# Patient Record
Sex: Female | Born: 1950
Health system: Southern US, Community
[De-identification: ages and names within clinical notes are randomized; demographics above are authoritative.]

## PROBLEM LIST (undated history)

## (undated) DIAGNOSIS — C50919 Malignant neoplasm of unspecified site of unspecified female breast: Secondary | ICD-10-CM

## (undated) DIAGNOSIS — R200 Anesthesia of skin: Secondary | ICD-10-CM

## (undated) DIAGNOSIS — M059 Rheumatoid arthritis with rheumatoid factor, unspecified: Secondary | ICD-10-CM

## (undated) DIAGNOSIS — M1711 Unilateral primary osteoarthritis, right knee: Secondary | ICD-10-CM

## (undated) DIAGNOSIS — R011 Cardiac murmur, unspecified: Secondary | ICD-10-CM

## (undated) DIAGNOSIS — Z9889 Other specified postprocedural states: Secondary | ICD-10-CM

## (undated) DIAGNOSIS — I499 Cardiac arrhythmia, unspecified: Secondary | ICD-10-CM

## (undated) DIAGNOSIS — E119 Type 2 diabetes mellitus without complications: Secondary | ICD-10-CM

## (undated) DIAGNOSIS — C801 Malignant (primary) neoplasm, unspecified: Secondary | ICD-10-CM

## (undated) DIAGNOSIS — R202 Paresthesia of skin: Secondary | ICD-10-CM

## (undated) DIAGNOSIS — R51 Headache: Secondary | ICD-10-CM

## (undated) DIAGNOSIS — I8290 Acute embolism and thrombosis of unspecified vein: Secondary | ICD-10-CM

## (undated) DIAGNOSIS — I739 Peripheral vascular disease, unspecified: Secondary | ICD-10-CM

## (undated) DIAGNOSIS — I209 Angina pectoris, unspecified: Secondary | ICD-10-CM

## (undated) DIAGNOSIS — I7300 Raynaud's syndrome without gangrene: Secondary | ICD-10-CM

## (undated) DIAGNOSIS — I10 Essential (primary) hypertension: Secondary | ICD-10-CM

## (undated) DIAGNOSIS — M1990 Unspecified osteoarthritis, unspecified site: Secondary | ICD-10-CM

## (undated) DIAGNOSIS — R197 Diarrhea, unspecified: Secondary | ICD-10-CM

## (undated) DIAGNOSIS — I639 Cerebral infarction, unspecified: Secondary | ICD-10-CM

## (undated) DIAGNOSIS — R112 Nausea with vomiting, unspecified: Secondary | ICD-10-CM

## (undated) DIAGNOSIS — Z87442 Personal history of urinary calculi: Secondary | ICD-10-CM

## (undated) DIAGNOSIS — R519 Headache, unspecified: Secondary | ICD-10-CM

## (undated) DIAGNOSIS — C719 Malignant neoplasm of brain, unspecified: Secondary | ICD-10-CM

## (undated) DIAGNOSIS — K219 Gastro-esophageal reflux disease without esophagitis: Secondary | ICD-10-CM

## (undated) DIAGNOSIS — D649 Anemia, unspecified: Secondary | ICD-10-CM

## (undated) DIAGNOSIS — C3490 Malignant neoplasm of unspecified part of unspecified bronchus or lung: Secondary | ICD-10-CM

## (undated) DIAGNOSIS — K635 Polyp of colon: Secondary | ICD-10-CM

## (undated) DIAGNOSIS — E7800 Pure hypercholesterolemia, unspecified: Secondary | ICD-10-CM

## (undated) DIAGNOSIS — J189 Pneumonia, unspecified organism: Secondary | ICD-10-CM

## (undated) HISTORY — PX: EYE SURGERY: SHX253

## (undated) HISTORY — PX: CHOLECYSTECTOMY: SHX55

## (undated) HISTORY — DX: Rheumatoid arthritis with rheumatoid factor, unspecified: M05.9

## (undated) HISTORY — DX: Unilateral primary osteoarthritis, right knee: M17.11

## (undated) HISTORY — PX: BILATERAL OOPHORECTOMY: SHX1221

## (undated) HISTORY — PX: OTHER SURGICAL HISTORY: SHX169

## (undated) SURGERY — COLONOSCOPY
Anesthesia: Moderate Sedation

---
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
## 1975-02-15 HISTORY — PX: ABDOMINAL HYSTERECTOMY: SHX81

## 1997-02-14 HISTORY — PX: OTHER SURGICAL HISTORY: SHX169

## 2000-02-29 ENCOUNTER — Ambulatory Visit (HOSPITAL_BASED_OUTPATIENT_CLINIC_OR_DEPARTMENT_OTHER): Admission: RE | Admit: 2000-02-29 | Discharge: 2000-02-29 | Payer: Self-pay | Admitting: General Surgery

## 2000-10-20 ENCOUNTER — Ambulatory Visit (HOSPITAL_COMMUNITY): Admission: RE | Admit: 2000-10-20 | Discharge: 2000-10-20 | Payer: Self-pay | Admitting: Pulmonary Disease

## 2000-11-02 ENCOUNTER — Ambulatory Visit (HOSPITAL_COMMUNITY): Admission: RE | Admit: 2000-11-02 | Discharge: 2000-11-02 | Payer: Self-pay | Admitting: Pulmonary Disease

## 2001-03-29 ENCOUNTER — Ambulatory Visit (HOSPITAL_COMMUNITY): Admission: RE | Admit: 2001-03-29 | Discharge: 2001-03-29 | Payer: Self-pay | Admitting: Pulmonary Disease

## 2001-04-09 ENCOUNTER — Ambulatory Visit (HOSPITAL_COMMUNITY): Admission: RE | Admit: 2001-04-09 | Discharge: 2001-04-09 | Payer: Self-pay | Admitting: Pulmonary Disease

## 2002-02-14 HISTORY — PX: OTHER SURGICAL HISTORY: SHX169

## 2002-08-26 ENCOUNTER — Other Ambulatory Visit: Admission: RE | Admit: 2002-08-26 | Discharge: 2002-08-26 | Payer: Self-pay | Admitting: Dermatology

## 2002-12-07 ENCOUNTER — Encounter: Payer: Self-pay | Admitting: Internal Medicine

## 2002-12-07 ENCOUNTER — Encounter: Admission: RE | Admit: 2002-12-07 | Discharge: 2002-12-07 | Payer: Self-pay | Admitting: Internal Medicine

## 2002-12-17 ENCOUNTER — Encounter: Admission: RE | Admit: 2002-12-17 | Discharge: 2002-12-17 | Payer: Self-pay | Admitting: Internal Medicine

## 2004-04-05 ENCOUNTER — Ambulatory Visit: Payer: Self-pay | Admitting: Internal Medicine

## 2004-04-26 ENCOUNTER — Ambulatory Visit: Payer: Self-pay | Admitting: Internal Medicine

## 2004-04-26 ENCOUNTER — Ambulatory Visit (HOSPITAL_COMMUNITY): Admission: RE | Admit: 2004-04-26 | Discharge: 2004-04-26 | Payer: Self-pay | Admitting: Internal Medicine

## 2004-05-06 ENCOUNTER — Encounter: Admission: RE | Admit: 2004-05-06 | Discharge: 2004-05-06 | Payer: Self-pay | Admitting: Orthopedic Surgery

## 2004-05-11 ENCOUNTER — Ambulatory Visit: Payer: Self-pay | Admitting: Internal Medicine

## 2004-06-02 ENCOUNTER — Encounter (HOSPITAL_COMMUNITY): Admission: RE | Admit: 2004-06-02 | Discharge: 2004-07-02 | Payer: Self-pay | Admitting: Orthopedic Surgery

## 2004-07-07 ENCOUNTER — Encounter (HOSPITAL_COMMUNITY): Admission: RE | Admit: 2004-07-07 | Discharge: 2004-08-06 | Payer: Self-pay | Admitting: Orthopedic Surgery

## 2004-08-12 ENCOUNTER — Ambulatory Visit (HOSPITAL_COMMUNITY): Admission: RE | Admit: 2004-08-12 | Discharge: 2004-08-12 | Payer: Self-pay | Admitting: Pulmonary Disease

## 2005-01-04 ENCOUNTER — Ambulatory Visit: Payer: Self-pay | Admitting: Internal Medicine

## 2005-09-09 ENCOUNTER — Encounter: Admission: RE | Admit: 2005-09-09 | Discharge: 2005-09-09 | Payer: Self-pay | Admitting: Internal Medicine

## 2006-01-19 ENCOUNTER — Ambulatory Visit: Payer: Self-pay | Admitting: Internal Medicine

## 2006-04-19 ENCOUNTER — Ambulatory Visit (HOSPITAL_COMMUNITY): Admission: RE | Admit: 2006-04-19 | Discharge: 2006-04-19 | Payer: Self-pay | Admitting: Hematology and Oncology

## 2006-06-15 ENCOUNTER — Ambulatory Visit: Payer: Self-pay | Admitting: Internal Medicine

## 2008-07-28 ENCOUNTER — Encounter: Payer: Self-pay | Admitting: Family Medicine

## 2008-09-22 ENCOUNTER — Encounter: Payer: Self-pay | Admitting: Family Medicine

## 2009-12-04 ENCOUNTER — Ambulatory Visit (HOSPITAL_COMMUNITY): Admission: RE | Admit: 2009-12-04 | Discharge: 2009-12-04 | Payer: Self-pay | Admitting: Internal Medicine

## 2009-12-21 ENCOUNTER — Ambulatory Visit: Payer: Self-pay | Admitting: Internal Medicine

## 2010-02-22 ENCOUNTER — Encounter (INDEPENDENT_AMBULATORY_CARE_PROVIDER_SITE_OTHER): Payer: Self-pay | Admitting: Emergency Medicine

## 2010-02-22 ENCOUNTER — Emergency Department (HOSPITAL_COMMUNITY)
Admission: EM | Admit: 2010-02-22 | Discharge: 2010-02-22 | Disposition: A | Discharge status: Other Acute Inpatient Hospital | Payer: Self-pay | Source: Home / Self Care | Admitting: Emergency Medicine

## 2010-02-22 ENCOUNTER — Inpatient Hospital Stay (HOSPITAL_COMMUNITY)
Admission: AD | Admit: 2010-02-22 | Discharge: 2010-02-28 | Payer: Self-pay | Attending: Internal Medicine | Admitting: Internal Medicine

## 2010-02-23 ENCOUNTER — Encounter (INDEPENDENT_AMBULATORY_CARE_PROVIDER_SITE_OTHER): Payer: Self-pay | Admitting: Family Medicine

## 2010-03-01 LAB — URINALYSIS, ROUTINE W REFLEX MICROSCOPIC
Bilirubin Urine: NEGATIVE
Hgb urine dipstick: NEGATIVE
Ketones, ur: NEGATIVE mg/dL
Nitrite: NEGATIVE
Protein, ur: NEGATIVE mg/dL
Specific Gravity, Urine: <1.005 — ABNORMAL LOW (ref 1.005–1.030)
Urine Glucose, Fasting: NEGATIVE mg/dL
Urobilinogen, UA: 0.2 mg/dL (ref 0.0–1.0)
pH: 5.5 (ref 5.0–8.0)

## 2010-03-01 LAB — COMPREHENSIVE METABOLIC PANEL
ALT: 22 U/L (ref 0–35)
AST: 21 U/L (ref 0–37)
Albumin: 3.8 g/dL (ref 3.5–5.2)
Alkaline Phosphatase: 98 U/L (ref 39–117)
BUN: 17 mg/dL (ref 6–23)
CO2: 25 mEq/L (ref 19–32)
Calcium: 9.6 mg/dL (ref 8.4–10.5)
Chloride: 107 mEq/L (ref 96–112)
Creatinine, Ser: 1.01 mg/dL (ref 0.4–1.2)
GFR calc Af Amer: >60 mL/min (ref >60)
GFR calc non Af Amer: 56 mL/min — ABNORMAL LOW (ref >60)
Glucose, Bld: 111 mg/dL — ABNORMAL HIGH (ref 70–99)
Potassium: 4.1 mEq/L (ref 3.5–5.1)
Sodium: 143 mEq/L (ref 135–145)
Total Bilirubin: 0.4 mg/dL (ref 0.3–1.2)
Total Protein: 7 g/dL (ref 6.0–8.3)

## 2010-03-01 LAB — URINE MICROSCOPIC-ADD ON

## 2010-03-01 LAB — BASIC METABOLIC PANEL
BUN: 10 mg/dL (ref 6–23)
BUN: 17 mg/dL (ref 6–23)
CO2: 26 mEq/L (ref 19–32)
CO2: 24 mEq/L (ref 19–32)
Calcium: 9 mg/dL (ref 8.4–10.5)
Calcium: 8.8 mg/dL (ref 8.4–10.5)
Chloride: 110 mEq/L (ref 96–112)
Chloride: 112 mEq/L (ref 96–112)
Creatinine, Ser: 0.81 mg/dL (ref 0.4–1.2)
Creatinine, Ser: 0.93 mg/dL (ref 0.4–1.2)
GFR calc Af Amer: >60 mL/min (ref >60)
GFR calc Af Amer: >60 mL/min (ref >60)
GFR calc non Af Amer: >60 mL/min (ref >60)
GFR calc non Af Amer: >60 mL/min (ref >60)
Glucose, Bld: 92 mg/dL (ref 70–99)
Glucose, Bld: 97 mg/dL (ref 70–99)
Potassium: 3.7 mEq/L (ref 3.5–5.1)
Potassium: 3.9 mEq/L (ref 3.5–5.1)
Sodium: 144 mEq/L (ref 135–145)
Sodium: 143 mEq/L (ref 135–145)

## 2010-03-01 LAB — CBC
HCT: 33.6 % — ABNORMAL LOW (ref 36.0–46.0)
HCT: 38.5 % (ref 36.0–46.0)
HCT: 35.4 % — ABNORMAL LOW (ref 36.0–46.0)
HCT: 36.9 % (ref 36.0–46.0)
HCT: 36.6 % (ref 36.0–46.0)
HCT: 34 % — ABNORMAL LOW (ref 36.0–46.0)
HCT: 33.6 % — ABNORMAL LOW (ref 36.0–46.0)
Hemoglobin: 10.8 g/dL — ABNORMAL LOW (ref 12.0–15.0)
Hemoglobin: 11.6 g/dL — ABNORMAL LOW (ref 12.0–15.0)
Hemoglobin: 10.9 g/dL — ABNORMAL LOW (ref 12.0–15.0)
Hemoglobin: 11.4 g/dL — ABNORMAL LOW (ref 12.0–15.0)
Hemoglobin: 11.3 g/dL — ABNORMAL LOW (ref 12.0–15.0)
Hemoglobin: 10.4 g/dL — ABNORMAL LOW (ref 12.0–15.0)
Hemoglobin: 10.2 g/dL — ABNORMAL LOW (ref 12.0–15.0)
MCH: 25.9 pg — ABNORMAL LOW (ref 26.0–34.0)
MCH: 25.3 pg — ABNORMAL LOW (ref 26.0–34.0)
MCH: 25.6 pg — ABNORMAL LOW (ref 26.0–34.0)
MCH: 25.4 pg — ABNORMAL LOW (ref 26.0–34.0)
MCH: 25.4 pg — ABNORMAL LOW (ref 26.0–34.0)
MCH: 25.4 pg — ABNORMAL LOW (ref 26.0–34.0)
MCH: 25.3 pg — ABNORMAL LOW (ref 26.0–34.0)
MCHC: 32.1 g/dL (ref 30.0–36.0)
MCHC: 30.1 g/dL (ref 30.0–36.0)
MCHC: 30.8 g/dL (ref 30.0–36.0)
MCHC: 30.9 g/dL (ref 30.0–36.0)
MCHC: 30.9 g/dL (ref 30.0–36.0)
MCHC: 30.6 g/dL (ref 30.0–36.0)
MCHC: 30.4 g/dL (ref 30.0–36.0)
MCV: 80.6 fL (ref 78.0–100.0)
MCV: 84.1 fL (ref 78.0–100.0)
MCV: 83.3 fL (ref 78.0–100.0)
MCV: 82.4 fL (ref 78.0–100.0)
MCV: 82.2 fL (ref 78.0–100.0)
MCV: 83.1 fL (ref 78.0–100.0)
MCV: 83.4 fL (ref 78.0–100.0)
Platelets: 274 10*3/uL (ref 150–400)
Platelets: 323 10*3/uL (ref 150–400)
Platelets: 281 10*3/uL (ref 150–400)
Platelets: 311 10*3/uL (ref 150–400)
Platelets: 316 10*3/uL (ref 150–400)
Platelets: 302 10*3/uL (ref 150–400)
Platelets: 266 10*3/uL (ref 150–400)
RBC: 4.17 MIL/uL (ref 3.87–5.11)
RBC: 4.58 MIL/uL (ref 3.87–5.11)
RBC: 4.25 MIL/uL (ref 3.87–5.11)
RBC: 4.48 MIL/uL (ref 3.87–5.11)
RBC: 4.45 MIL/uL (ref 3.87–5.11)
RBC: 4.09 MIL/uL (ref 3.87–5.11)
RBC: 4.03 MIL/uL (ref 3.87–5.11)
RDW: 15.6 % — ABNORMAL HIGH (ref 11.5–15.5)
RDW: 15.8 % — ABNORMAL HIGH (ref 11.5–15.5)
RDW: 15.7 % — ABNORMAL HIGH (ref 11.5–15.5)
RDW: 15.4 % (ref 11.5–15.5)
RDW: 15.7 % — ABNORMAL HIGH (ref 11.5–15.5)
RDW: 16.3 % — ABNORMAL HIGH (ref 11.5–15.5)
RDW: 16.3 % — ABNORMAL HIGH (ref 11.5–15.5)
WBC: 11.7 10*3/uL — ABNORMAL HIGH (ref 4.0–10.5)
WBC: 11.1 10*3/uL — ABNORMAL HIGH (ref 4.0–10.5)
WBC: 11.9 10*3/uL — ABNORMAL HIGH (ref 4.0–10.5)
WBC: 12.5 10*3/uL — ABNORMAL HIGH (ref 4.0–10.5)
WBC: 16.2 10*3/uL — ABNORMAL HIGH (ref 4.0–10.5)
WBC: 14.9 10*3/uL — ABNORMAL HIGH (ref 4.0–10.5)
WBC: 10.6 10*3/uL — ABNORMAL HIGH (ref 4.0–10.5)

## 2010-03-01 LAB — HEPARIN LEVEL (UNFRACTIONATED)
Heparin Unfractionated: 0.33 IU/mL (ref 0.30–0.70)
Heparin Unfractionated: 0.3 IU/mL (ref 0.30–0.70)
Heparin Unfractionated: >2 IU/mL — ABNORMAL HIGH (ref 0.30–0.70)
Heparin Unfractionated: <0.1 IU/mL — ABNORMAL LOW (ref 0.30–0.70)

## 2010-03-01 LAB — CARDIAC PANEL(CRET KIN+CKTOT+MB+TROPI)
CK, MB: 1.1 ng/mL (ref 0.3–4.0)
CK, MB: 1.3 ng/mL (ref 0.3–4.0)
CK, MB: 1.4 ng/mL (ref 0.3–4.0)
Relative Index: INVALID (ref 0.0–2.5)
Relative Index: INVALID (ref 0.0–2.5)
Relative Index: INVALID (ref 0.0–2.5)
Total CK: 44 U/L (ref 7–177)
Total CK: 40 U/L (ref 7–177)
Total CK: 42 U/L (ref 7–177)
Troponin I: 0.02 ng/mL (ref 0.00–0.06)
Troponin I: <0.01 ng/mL (ref 0.00–0.06)
Troponin I: 0.01 ng/mL (ref 0.00–0.06)

## 2010-03-01 LAB — HEMOGLOBIN A1C
Hgb A1c MFr Bld: 6.1 % — ABNORMAL HIGH (ref <5.7)
Hgb A1c MFr Bld: 5.9 % — ABNORMAL HIGH (ref <5.7)
Mean Plasma Glucose: 128 mg/dL — ABNORMAL HIGH (ref <117)
Mean Plasma Glucose: 123 mg/dL — ABNORMAL HIGH (ref <117)

## 2010-03-01 LAB — LIPID PANEL
Cholesterol: 240 mg/dL — ABNORMAL HIGH (ref 0–200)
HDL: 58 mg/dL (ref >39)
LDL Cholesterol: 132 mg/dL — ABNORMAL HIGH (ref 0–99)
Total CHOL/HDL Ratio: 4.1 RATIO
Triglycerides: 248 mg/dL — ABNORMAL HIGH (ref <150)
VLDL: 50 mg/dL — ABNORMAL HIGH (ref 0–40)

## 2010-03-01 LAB — POCT CARDIAC MARKERS
CKMB, poc: <1 ng/mL — ABNORMAL LOW (ref 1.0–8.0)
Myoglobin, poc: 72.2 ng/mL (ref 12–200)
Troponin i, poc: <0.05 ng/mL (ref 0.00–0.09)

## 2010-03-01 LAB — PROTIME-INR
INR: 0.96 (ref 0.00–1.49)
Prothrombin Time: 13 seconds (ref 11.6–15.2)

## 2010-03-01 LAB — APTT: aPTT: 26 seconds (ref 24–37)

## 2010-03-07 ENCOUNTER — Encounter: Payer: Self-pay | Admitting: Hematology and Oncology

## 2010-03-07 ENCOUNTER — Encounter: Payer: Self-pay | Admitting: Internal Medicine

## 2010-03-10 NOTE — Consult Note (Addendum)
[name], [name]               ACCOUNT NO.:  [id number]  MEDICAL RECORD NO.:  [id number]           PATIENT TYPE:  LOCATION:                                 FACILITY:  PHYSICIAN:  Joycelyn Schmid, MD   DATE OF BIRTH:  [birth year]  DATE OF CONSULTATION:  02/23/2010 DATE OF DISCHARGE:                                CONSULTATION   REASON OR CONSULTATION:  Left internal carotid artery stenosis.  HISTORY OF PRESENT ILLNESS:  This is a 60 year old Caucasian family with past medical history of peripheral neuropathy, irritable bowel syndrome, GERD, rheumatoid arthritis, left breast DCIS status post lumpectomy, status post cholecystectomy, hysterectomy, cervical cancer in situ, and history of bilateral carotid stenosis left than 50%.  The patient was brought to the hospital on this event secondary to transient periods of word-finding difficulty and right facial numbness.  She states that on Saturday at approximately 5 p.m. she was on the phone with a friend, and for approximately 30 seconds she noted she was unable to find the words that she wanted to say.  She comes very off but did not seek attention at this time.  The patient went to sleep that night and states that she stated in bed throughout Sunday.  On Monday morning, she got up as usual and went to work.  While she was at work, she noted tingling sensation in the right V3 region of her face.  This was the only symptom that she was experiencing.  The paresthetic type sensation only lasted for 30 seconds and then dissipated; however, due to the event on Saturday and now repeat abnormal sensation she felt as though she should seek medical attention.  On further discussion with the patient, she does note for the past month that she has had some difficulty with handwriting.  She is right-handed.  Oftentimes, she notes her handwriting is unclear for periods of time.  The patient was brought to [facility] where she obtained a  CT of brain which was negative and further evaluated with MRI of brain which again showed no acute infarct, however, did show positive white matter changes most likely secondary to small vessel disease.  It was noted in her MRA of the brain that she had marked narrowing of her left ICA.  When compared to the carotid Doppler that was obtained back in October 2011, there was an obvious difference.  The October reading states that there was bilateral carotid stenosis less than 50%.  For the inconsistency in the readings, Neurology was consulted to further evaluate the patient and give recommendations.  PAST MEDICAL HISTORY: 1. Hypercholesterolemia. 2. Peripheral neuropathy. 3. Irritable bowel syndrome. 4. GERD. 5. Rheumatoid arthritis. 6. Left breast DCIS status post lumpectomy. 7. Status post cholecystectomy. 8. Hysterectomy. 9. Cervical cancer in situ.  Medications include Elavil, Aspirin 325 mg daily, Os-Cal, vitamin D, Plaquenil, Cozaar, Mobic, methylprednisolone, Bystolic, Protonix, Tylenol, Zofran, Ultram.  ALLERGIES:  ISOSORBIDE, NSAIDS, LODINE, MESALAMINE, PIROXICAM, CONTRAST MEDIA, OXYCODONE, SULFA, METHOTREXATE, and DILTIAZEM.  SOCIAL HISTORY:  The patient lives with her husband.  She works at [employer].  She quit smoking  back in 1983, at that time had a two-pack per day habit.  She does not drink or do illicit drugs.  REVIEW OF SYSTEMS:  Positive for paresthesias of her right face, word- finding difficulties, difficulty with handwriting intermittently.  PHYSICAL EXAMINATION:  NIH stroke scale zero.  Modified Rankin scale zero.  Blood pressure is 150/70, respiration 20, pulse 68, temperature 98.5.  She is alert and oriented x3, carries out 2 to 3-step commands without difficulty.  Pupils are equal, round, reactive to light and accommodating, conjugate.  Extraocular movements are intact.  Visual fields grossly intact.  Face symmetric.  Tongue is  midline.  Uvula is midline.  The patient shows no dysarthria, aphasia, slurred speech. Facial sensation V1 through V3 bilaterally are intact.  Coordination is intact to finger-nose, heel-to-shin, and fine motor movements.  Motor is 5/5 globally.  Gait was within normal limits with good arm swing and normal-based gait.  Deep tendon reflexes 2+, downgoing toes.  The patient showed no drift in the upper or lower extremities.  Pulmonary is clear to auscultation bilaterally.  Cardiovascular showed S1 and S2 with no murmurs noted.  Neck is negative for bruits and supple.  Sensation was intact throughout to pinprick, light touch; however, it was noted that she has decreased sensation on her toes and ankles secondary to peripheral neuropathy which she has had for multiple years now.  Sodium 143, potassium 4.1, chloride 107, CO2 25, BUN 17, creatinine 1.07, glucose 111.  White blood cell count 11.7, platelets 274,000, hemoglobin 10.8, hematocrit 33.6.  Triglycerides 248, cholesterol 240, HDL 58, LDL 132.  HbA1c is 6.1.  A 2-D echo showed 55% to 60% ejection fraction with no wall abnormalities and negative PFO.  MRA of head showed markedly narrow left ICA.  MRI of head showed no acute infarct with white matter changes most likely secondary to small vessel disease.  ASSESSMENT:  This is a 60 year old female with transient episodes of word-finding difficulty and right facial numbness which has now resolved.  The patient's MRI was negative; however, the patient's MRA did show marked left internal carotid artery stenosis which is different from the October carotid Dopplers that showed mild narrowing of bilateral carotid arteries.  RECOMMENDATIONS: 1. Agree with MRA of neck. 2. Repeat carotid Dopplers. 3. I have changed the patient's aspirin to Plavix. 4. If carotid Dopplers or MRA of neck shows significant narrowing in     the setting of her symptoms at this time, we would recommend a      vascular consult for further evaluation.  I have discussed these     findings with Dr. Marjory Lies and we will continue to follow the     patient while she is in hospital.     Felicie Morn, PA-C   ______________________________ Joycelyn Schmid, MD    DS/MEDQ  D:  02/23/2010  T:  02/24/2010  Job:  [id number]  Electronically Signed by Felicie Morn PA-C on 02/24/2010 12:14:09 PM Electronically Signed by Joycelyn Schmid  on 03/10/2010 03:00:09 PM

## 2010-03-10 NOTE — Discharge Summary (Addendum)
[name], [name]               ACCOUNT NO.:  [id number]  MEDICAL RECORD NO.:  [id number]          PATIENT TYPE:  INP  LOCATION:  3710                         FACILITY:  [facility]  PHYSICIAN:  Rock Nephew, MD       DATE OF BIRTH:  [birth year]  DATE OF ADMISSION:  02/22/2010 DATE OF DISCHARGE:  02/28/2010                        DISCHARGE SUMMARY - REFERRING   PRIMARY CARE PHYSICIAN:  Pearson Grippe, MD  DISCHARGE DIAGNOSES: 1. History of transient ischemic attack with left carotid artery     stenosis, history of left internal coronary artery stenosis. 2. History of hypertension with transient hypotension. 3. Rheumatoid arthritis. 4. History of gastroesophageal reflux disease, on a proton pump     inhibitor. 5. Status post cholecystectomy. 6. Status post left breast ductal carcinoma in situ, status post     lumpectomy. 7. Status post hysterectomy followed by oophorectomy for cervical     cancer in situ. 8. History of peripheral neuropathy. 9. History of irritable bowel syndrome.  DISCHARGE MEDICATIONS: 1. Plavix 75 mg p.o. daily. 2. Simvastatin 20 mg p.o. daily. 3. Vicodin 1 tablet by mouth every 6 hours as needed for pain. 4. Amitriptyline 25 mg two capsules by mouth daily at bedtime. 5. Bystolic 10 mg three tablets by mouth every morning. 6. Calcium carbonate/vitamin D one tablet by mouth twice daily. 7. Losartan 50 mg 1 tablet by mouth every morning. 8. Meloxicam 7.5 mg 1 tablet by mouth every morning. 9. Methylprednisolone 4 mg one tablet by mouth every morning. 10.Plaquenil 200 mg one tablet by mouth twice daily. 11.Prevacid 15 mg one capsule by mouth every morning. 12.Tramadol 50 mg one tablet by mouth daily at bedtime. 13.Vitamin D3-1000 units 1 tablet by mouth twice daily.  DISPOSITION:  The patient is to be discharged home.  The patient should be on a heart-healthy diet.  The patient should follow up with Dr. Pearson Grippe within 1 week.  The patient should follow up with  Dr. Pearlean James in 2- 4 weeks.  The patient should also follow up with Dr. Delorise Jackson within 2-4 weeks.  PROCEDURES PERFORMED:  The patient had a CT scan of the head which showed no acute intracranial abnormalities.  The patient had an MRI of the brain which showed no acute infarct, white matter type changes probably related to small-vessel disease, abnormal appearance of left internal carotid artery.  The patient had an MRA of the head which showed markedly narrowed left internal carotid artery.  Question if this related to the proximal stenosis or dissection.  CT angiogram of the neck would prove helpful for further delineation.  The patient had a 2-D echocardiogram which showed a left ventricular ejection fraction of 55- 60%.  Wall motion was normal.  There were no regional wall motion abnormalities.  The patient's carotid Doppler showed right 40-59% ICA stenosis.  The patient also had left internal carotid artery severe diminished flow throughout the ICA consistent with a possible more distal occlusion versus dissection, vertebral arteries patent and the flow is antegrade bilaterally.  The patient also had an angiogram by Dr. Corliss Skains which showed ICA stenosis, occluded left internal carotid artery  distal to the ophthalmic artery origin.  The patient also has what is thought to be a 7.5 mm right-sided aneurysm, however, currently it is not dictated so it is not completely clear.  CONSULTATIONS:  Dr. Corliss Skains, and [facility] including Dr. Marjory Lies, Dr. Vickey Huger and Dr. Pearlean James.  INITIAL HISTORY:  Chief complaint was transient right-sided facial numbness and speech changes.  The patient is a 60 year old female with multiple medical problems who presented to [facility] with transient right facial numbness and speech difficulty.  She had no focal motor deficits. She was admitted for a TIA workup.  James COURSE: 1. The patient had a TIA with left internal  carotid artery stenosis     and the patient also has an aneurysm present.  The patient was     placed on Plavix.  The patient was transferred to [facility] for further care.  The patient was seen by [name]     neurology as well as Dr. Corliss Skains.  Also Dr. Vickey Huger has     recommended the patient's blood pressure be kept above 140     systolic, to maximize brain perfusion, and the patient was     continued on Plavix and Zocor as they are added. 2. History of left internal carotid artery stenosis with an aneurysm.     Again, the patient should have outpatient follow-up with Dr. Pearlean James     and also Dr. Corliss Skains.  The plan will materialize further     depending on the outpatient evaluation. 3. History of hypertension with hypotension.  The patient should     continue taking blood pressure medications.  The patient is     instructed to check blood pressure twice daily and show that to her     physicians. 4. Rheumatoid arthritis.  The patient is continued on     methylprednisolone 4 mg every morning. 5. GERD.  The patient was placed on a PPI.  Please note that this is not an official document until electronically signed  Rock Nephew, MD     NH/MEDQ  D:  02/28/2010  T:  02/28/2010  Job:  [id number]  cc:   Monica Maroon, MD  Electronically Signed by Rock Nephew MD on 03/10/2010 06:24:18 PM

## 2010-03-29 ENCOUNTER — Ambulatory Visit (HOSPITAL_COMMUNITY)
Admission: RE | Admit: 2010-03-29 | Discharge: 2010-03-29 | Disposition: A | Discharge status: Home / Self Care | Payer: BC Managed Care – PPO | Source: Ambulatory Visit | Attending: Interventional Radiology | Admitting: Interventional Radiology

## 2010-03-29 ENCOUNTER — Other Ambulatory Visit (HOSPITAL_COMMUNITY): Payer: Self-pay | Admitting: Interventional Radiology

## 2010-03-29 DIAGNOSIS — I729 Aneurysm of unspecified site: Secondary | ICD-10-CM

## 2010-04-15 HISTORY — PX: OTHER SURGICAL HISTORY: SHX169

## 2010-07-02 NOTE — Consult Note (Signed)
[name], [name]               ACCOUNT NO.:  [id number]   MEDICAL RECORD NO.:  [id number]          PATIENT TYPE:   LOCATION:                                 FACILITY:   PHYSICIAN:  Lionel December, M.D.    DATE OF BIRTH:  21-Sep-1950   DATE OF CONSULTATION:  04/05/2004  DATE OF DISCHARGE:                                   CONSULTATION   REASON FOR CONSULTATION:  Chronic GERD now with refractory symptoms.   HISTORY OF PRESENT ILLNESS:  [name] is a 60 year old Caucasian female  patient of Dr. Juanetta Gosling sent for evaluation of the above.  [name] notes  over the last three years she has had severe GERD and has been on PPI  therapy.  Most recently she has been on Nexium 40 mg daily.  She did do a  short trial of protonix 40 mg daily with minimal relief in her refractory  symptoms as well.  She typically has daily symptoms including water brash,  indigestion, and atypical chest pain.  She also occasional dysphasia with  solid foods.  She also notes odynophagia with liquids, as she states it  burns along her esophagus.  She has daily episodes of nausea and some  intermittent vomiting as well.  She denies any regurgitation.  She denies  any early satiety.  She also notes an epigastric pain, which she describes  as a burning match-like sensation.  Her symptoms occur intermittently, not  necessarily post prandial.  She also notes bowel movements which are  irregular and has a history of refractory IBS, mostly diarrheal based.  She  denies any rectal bleeding or melena.   PAST MEDICAL HISTORY:  1.  History of DCIS of the left breast diagnosed in 2004.  She is status      post a lumpectomy and further surgery for clear margins.  2.  Status post cholecystectomy in the mid 1990s.  3.  Right wrist surgery.  4.  Status post hysterectomy followed by bilateral oophorectomy in 2000.  5.  Rheumatoid arthritis.  6.  Chronic GERD.  7.  Cervical carcinoma in situ.  8.  Peripheral neuropathy.  9.  Refractory IBS.   CURRENT MEDICATIONS:  1.  Toprol-XL 50 mg b.i.d.  2.  Prednisone 7 mg a day.  3.  Mobic 15 mg daily.  4.  Aspirin 81 mg daily.  5.  Nexium 40 mg daily.  6.  Plaquenil 200 mg b.i.d.  7.  Amitriptyline HCl 45 mg daily.  8.  Tramadol 5 mg p.r.n.  9.  Humara injections weekly.   ALLERGIES:  Multiple which include:  1.  FELDENE.  2.  METHOTREXATE.  3.  SULFA.  4.  __________.  5.  SOLGANAL.  6.  IMURAN.  7.  LODINE.  8.  NSAIDS.  9.  ARAVA.  10. VIOXX.  11. ASACOL.  12. PERCOCET.  13. CARDIZEM CD.  14. IVP CONTRAST DYE.   FAMILY HISTORY:  No known family history of colorectal carcinoma of the  liver or chronic GI problems.  Mother is age 59 and has a history  of  hypertension and diabetes mellitus.  Father is deceased at age 60 secondary  to coronary artery disease and hypertension.  He also had peptic ulcer  disease.  She has two brothers who are relatively healthy.   SOCIAL HISTORY:  Ms. Rena has been married for 21 years.  She is employed  full-time.  She reports a 10-pack year remote tobacco use history.  She  denies any alcohol or drug use.   REVIEW OF SYSTEMS:  CONSTITUTIONAL:  Weight is steadily increasing.  Appetite has been good.  She is complaining of some fatigue, which she  states goes along with her rheumatoid arthritis.  She has good days and bad  days.  She denies any fever.  She does report occasional chills.  CARDIOVASCULAR:  She denies any chest pain or palpitations.  PULMONARY:  She  has had a nonproductive cough and intermittent post nasal drip.  Otherwise  denies any shortness of breath or hemoptysis.  GASTROINTESTINAL:  See HPI.   PHYSICAL EXAMINATION:  VITAL SIGNS:  Weight 217 pounds.  Temperature 98.3,  blood pressure 160/82, pulse 88.  GENERAL:  [name] is a 60 year old female who is alert, oriented,  pleasant, and cooperative in no acute distress.  HEENT:  Sclerae clear.  Nonicteric.  Conjunctivae pink.  Oropharynx pink  and  moist.  She does have a right tonsillar small cystic lesion, possibly a  mucoid cyst.  NECK:  Supple without any masses or thyromegaly.  HEART:  Regular rate and rhythm with a 1/6 murmur noted.  LUNGS:  Clear to auscultation bilaterally.  ABDOMEN:  Protuberant with positive bowel sounds x4.  No bruits auscultated.  Soft, nontender, nondistended without palpable masses or  hepatosplenomegaly.  No rebound tenderness or guarding.  RECTAL:  Deferred.  EXTREMITIES:  2+ pedal pulses bilaterally.  Trace bilateral lower extremity  edema.   IMPRESSION:  [name] is a 60 year old female with chronic gastroesophageal  reflux disease over the last at least three years that she can recall.  Her  symptoms have been very refractory more recently, despite daily proton pump  inhibitor therapy.  She is also noting severe odynophagia and burning with  liquids.  She is also noting occasional solid food dysphasia as well.  Her  symptoms are most consistent with erosive reflux esophagitis.  However,  given the refractory nature of her symptoms I felt further evaluation is  warranted with an esophagogastroduodenoscopy to look for complications of  chronic gastroesophageal reflux disease including the development of  Barrett's esophagus, Web ring, or stricture.   She also has a history of irritable bowel syndrome, which is relatively well  controlled at this time.   RECOMMENDATIONS:  1.  We will schedule an EGD with Dr. Karilyn Cota in the near future.  I have this      procedure including the risks and benefits to include but not limited to      bleeding, infection, perforation, and drug reaction.  She agrees with      this plan and a consent will be obtained.  She is a requesting a      screening colonoscopy at the same time.  2.  She is to continue Nexium 40 mg daily for now.  3.  Further recommendations pending the procedure.   We would like to thank Dr. Juanetta Gosling for allowing [place] to participate in  the care of Ms. Runkel.      KC/MEDQ  D:  04/06/2004  T:  04/06/2004  Job:  [id number] 

## 2010-07-02 NOTE — Op Note (Signed)
NAMEKIRSTINE, JACQUIN               ACCOUNT NO.:  [id number]   MEDICAL RECORD NO.:  [id number]          PATIENT TYPE:  AMB   LOCATION:  DAY                           FACILITY:  [facility]   PHYSICIAN:  Lionel December, M.D.    DATE OF BIRTH:  [birth year]   DATE OF PROCEDURE:  04/26/2004  DATE OF DISCHARGE:                                 OPERATIVE REPORT   PROCEDURE:  Esophagogastroduodenoscopy, followed by total colonoscopy.   INDICATION:  Syncere is a 60 year old Caucasian female with at least a three-  year history of symptoms of GERD which are not well-controlled with therapy.  She also has occasional solid food dysphagia and at times odynophagia.  She  has been on various PPIs.  She feels she is getting the most relief with  Nexium.  Only one she has not tried is Aciphex.  She is also undergoing  screening colonoscopy.  Family history is negative for colorectal carcinoma.  The procedure risks were reviewed with the patient, informed consent was  obtained.   PREMEDICATION:  Cetacaine spray for pharyngeal topical anesthesia, Demerol  50 mg IV, Versed 12 mg IV in divided dose.   FINDINGS:  Procedure performed in endoscopy suite.  The patient's vital  signs and O2 saturation were monitored during procedure and remained stable.   </PROCEDURE #1>  Esophagogastroduodenoscopy.   The patient was placed in the left lateral position and Olympus video scope  was passed oropharynx without any difficulty into esophagus.   Esophagus:  Mucosa of the esophagus was normal throughout.  Squamocolumnar  junction was at 39 cm and no ring or stricture was noted.  There was a  swollen, friable fold of mucosa just below the Z-line.  This was biopsied  for histology on the way out.  No hernia was noted.   Stomach:  It was empty and distended very well with insufflation.  Folds of  the proximal stomach were normal.  Examination of the mucosa revealed linear  streaks of Barrett's mucosa at antrum but no  erosions or ulcers were noted.  Pyloric channel was patent.  Angularis, fundus and cardia were examined by  retroflexing the scope and were normal.  This swollen fold was easily seen  on this view and biopsied both in this position and on forward view.   Duodenum:  Bulbar mucosa was normal.  Scope was passed to the second part of  the duodenum, where mucosa and folds were normal.  Endoscope was withdrawn.  The patient was prepared for procedure #2.   PROCEDURE #2:  Colonoscopy.   Rectal examination performed.  No abnormality noted on external or digital  exam.  The Olympus video scope was placed in the rectum and advanced under  vision into sigmoid colon and beyond.  Preparation was excellent.  She had  some stool in the cecum, which was suctioned out.  Cecal landmarks, i.e.  appendiceal orifice and ileocecal valve, were well-seen and a picture second  for the record.  A short segment of TI was also examined and was normal.  There two small polyps at ascending colon which were  ablated via cold biopsy  and submitted in one container.  Third polyp, slightly larger. was at  hepatic flexure, which was also ablated via cold biopsy.  Mucosa of the rest  of the colon was normal.  Rectal mucosa similarly was normal.  The scope was  retroflexed to examine anorectal junction, and small hemorrhoids were noted  below the dentate line.  Endoscope was straightened and withdrawn.  The  patient tolerated the procedure well.   FINAL DIAGNOSES:  1.  No endoscopic evidence of reflux esophagitis, ring or stricture      formation.  2.  Swollen fold of gastric mucosa just below the Z-line, which was biopsied      for histology.  3.  Antral gastritis.  4.  Three small polyps that were ablated via cold biopsy, two from the      ascending colon, one from hepatic flexure.   RECOMMENDATIONS:  1.  She will continue entire reflux measures as before. Will increase her      Nexium to 40 mg p.o. b.i.d.  2.  H.  pylori serology will be checked today.  3.  I will be contacting the patient with results of pending studies and      further recommendations.      NR/MEDQ  D:  04/26/2004  T:  04/26/2004  Job:  [id number]   cc:   Ramon Dredge L. Juanetta Gosling, M.D.  [address]  [place]  [place] [address]  Fax: [phone]

## 2010-07-02 NOTE — Procedures (Signed)
[facility]  Patient:    [name], [name] Visit Number: [id number] MRN: [id number]          Service Type: OUT Location: RAD Attending Physician:  Fredirick Maudlin Dictated by:   Kari Baars, M.D. Proc. Date: 04/09/01 Admit Date:  04/09/2001                                Stress Test  REASON FOR PROCEDURE:  [name] has been having chest discomfort and is undergoing graded exercise testing to rule out ischemic cause.  There are no contraindications to Persantine Cardiolite graded exercise test.  She is undergoing Persantine Cardiolite because she has rheumatoid arthritis and is not able to exercise on the treadmill.  PROCEDURE:  Persantine was infused per protocol, and in 6 minutes Cardiolite was injected.  She did develop nausea and headache from the Persantine which resolved spontaneously.  Her blood pressure response to Persantine was normal, and she had no chest discomfort, had no electrocardiographic changes suggestive of inducible ischemia.  IMPRESSION: 1. No evidence of inducible ischemia. 2. Cardiolite images pending. Dictated by:   Kari Baars, M.D. Attending Physician:  Fredirick Maudlin DD:  04/09/01 TD:  04/09/01 Job: [id number]

## 2010-11-30 ENCOUNTER — Other Ambulatory Visit (INDEPENDENT_AMBULATORY_CARE_PROVIDER_SITE_OTHER): Payer: Self-pay | Admitting: *Deleted

## 2010-11-30 DIAGNOSIS — Z86010 Personal history of colonic polyps: Secondary | ICD-10-CM

## 2010-12-08 MED ORDER — SODIUM CHLORIDE 0.45 % IV SOLN
Freq: Once | INTRAVENOUS | Status: AC
  Administered 2010-12-09: 10:00:00 via INTRAVENOUS

## 2010-12-09 ENCOUNTER — Ambulatory Visit (HOSPITAL_COMMUNITY)
Admission: RE | Admit: 2010-12-09 | Discharge: 2010-12-09 | Disposition: A | Discharge status: Home / Self Care | Payer: BC Managed Care – PPO | Source: Ambulatory Visit | Attending: Internal Medicine | Admitting: Internal Medicine

## 2010-12-09 ENCOUNTER — Encounter (INDEPENDENT_AMBULATORY_CARE_PROVIDER_SITE_OTHER): Payer: BC Managed Care – PPO | Admitting: Internal Medicine

## 2010-12-09 ENCOUNTER — Encounter (HOSPITAL_COMMUNITY): Payer: Self-pay

## 2010-12-09 ENCOUNTER — Other Ambulatory Visit (INDEPENDENT_AMBULATORY_CARE_PROVIDER_SITE_OTHER): Payer: Self-pay | Admitting: Internal Medicine

## 2010-12-09 ENCOUNTER — Encounter (HOSPITAL_COMMUNITY)
Admission: RE | Disposition: A | Discharge status: Home / Self Care | Payer: Self-pay | Source: Ambulatory Visit | Attending: Internal Medicine

## 2010-12-09 DIAGNOSIS — K5730 Diverticulosis of large intestine without perforation or abscess without bleeding: Secondary | ICD-10-CM

## 2010-12-09 DIAGNOSIS — Z1211 Encounter for screening for malignant neoplasm of colon: Secondary | ICD-10-CM

## 2010-12-09 DIAGNOSIS — Z860100 Personal history of colon polyps, unspecified: Secondary | ICD-10-CM | POA: Insufficient documentation

## 2010-12-09 DIAGNOSIS — D126 Benign neoplasm of colon, unspecified: Secondary | ICD-10-CM

## 2010-12-09 DIAGNOSIS — E7800 Pure hypercholesterolemia, unspecified: Secondary | ICD-10-CM | POA: Insufficient documentation

## 2010-12-09 DIAGNOSIS — Z79899 Other long term (current) drug therapy: Secondary | ICD-10-CM | POA: Insufficient documentation

## 2010-12-09 DIAGNOSIS — Z7982 Long term (current) use of aspirin: Secondary | ICD-10-CM | POA: Insufficient documentation

## 2010-12-09 DIAGNOSIS — I10 Essential (primary) hypertension: Secondary | ICD-10-CM | POA: Insufficient documentation

## 2010-12-09 DIAGNOSIS — Z09 Encounter for follow-up examination after completed treatment for conditions other than malignant neoplasm: Secondary | ICD-10-CM | POA: Insufficient documentation

## 2010-12-09 HISTORY — DX: Essential (primary) hypertension: I10

## 2010-12-09 HISTORY — DX: Paresthesia of skin: R20.0

## 2010-12-09 HISTORY — PX: COLONOSCOPY: SHX5424

## 2010-12-09 HISTORY — DX: Other specified postprocedural states: R11.2

## 2010-12-09 HISTORY — DX: Unspecified osteoarthritis, unspecified site: M19.90

## 2010-12-09 HISTORY — DX: Angina pectoris, unspecified: I20.9

## 2010-12-09 HISTORY — DX: Diarrhea, unspecified: R19.7

## 2010-12-09 HISTORY — DX: Malignant (primary) neoplasm, unspecified: C80.1

## 2010-12-09 HISTORY — DX: Cerebral infarction, unspecified: I63.9

## 2010-12-09 HISTORY — DX: Polyp of colon: K63.5

## 2010-12-09 HISTORY — DX: Gastro-esophageal reflux disease without esophagitis: K21.9

## 2010-12-09 HISTORY — DX: Paresthesia of skin: R20.2

## 2010-12-09 HISTORY — DX: Other specified postprocedural states: Z98.890

## 2010-12-09 HISTORY — DX: Pure hypercholesterolemia, unspecified: E78.00

## 2010-12-09 SURGERY — COLONOSCOPY
Anesthesia: Moderate Sedation

## 2010-12-09 MED ORDER — MEPERIDINE HCL 50 MG/ML IJ SOLN
INTRAMUSCULAR | Status: AC
  Filled 2010-12-09: qty 1

## 2010-12-09 MED ORDER — MIDAZOLAM HCL 5 MG/5ML IJ SOLN
INTRAMUSCULAR | Status: AC
  Filled 2010-12-09: qty 10

## 2010-12-09 MED ORDER — MEPERIDINE HCL 50 MG/ML IJ SOLN
INTRAMUSCULAR | Status: DC | PRN
  Administered 2010-12-09 (×2): 25 mg via INTRAVENOUS

## 2010-12-09 MED ORDER — MIDAZOLAM HCL 5 MG/5ML IJ SOLN
INTRAMUSCULAR | Status: DC | PRN
  Administered 2010-12-09: 3 mg via INTRAVENOUS
  Administered 2010-12-09 (×2): 2 mg via INTRAVENOUS

## 2010-12-09 NOTE — Op Note (Signed)
COLONOSCOPY PROCEDURE REPORT  PATIENT:  [name]  MR#:  [id number] Birthdate:  09-Aug-1950, 60 y.o., female Endoscopist:  Dr. Malissa Hippo, MD Referred By:  Dr. Pearson Grippe, M.D. Procedure Date: 12/09/2010  Procedure:   Colonoscopy with snare polypectomy.  Indications:  Patient is 60 year old Caucasian female with history of colonic adenomas. Her last exam was in March 2006 with removal of 3 polyps. She is undergoing surveillance colonoscopy.  Informed Consent:  Procedure and risks were reviewed with the patient and informed consent was obtained. Medications:  Demerol 50 mg IV Versed 7 mg IV  Description of procedure:  After a digital rectal exam was performed, that colonoscope was advanced from the anus through the rectum and colon to the area of the cecum, ileocecal valve and appendiceal orifice. The cecum was deeply intubated. These structures were well-seen and photographed for the record. From the level of the cecum and ileocecal valve, the scope was slowly and cautiously withdrawn. The mucosal surfaces were carefully surveyed utilizing scope tip to flexion to facilitate fold flattening as needed. The scope was pulled down into the rectum where a thorough exam including retroflexion was performed. TI was also examined.  Findings:   Prep excellent. Normal terminal ileum. Abnormal appearing sessile tissue at ileocecal valve; it had different texture than the surrounding ileal mucosa. It was therefore snared. About 7-8 mm in maximal diameter. Few small diverticula at sigmoid colon. Normal rectal mucosa and dentate line.   Therapeutic/Diagnostic Maneuvers Performed:  See above  Complications:  None  Cecal Withdrawal Time:  10 minutes  Impression:  Normal terminal ileum. 7-8 mm sessile polyp at ileocecal valve. This could be prolapsed ileal mucosa but  had different texture. Therefore it was snared. Few small diverticula at sigmoid colon.  Recommendations:  Standard  instructions. No aspirin for one week. I will be contacting patient with biopsy results.  [name]  12/09/2010 10:58 AM  CC: Dr. No primary provider on file. & Dr. No ref. provider found

## 2010-12-09 NOTE — H&P (Signed)
[name] is an 60 y.o. female.   Chief Complaint: Patient is here for colonoscopy HPI: Patient is 60 year old Caucasian female with history of colonic polyps. She is here for surveillance colonoscopy. She denies melena or rectal bleeding diarrhea or constipation. She does complain of intermittent burning pain in right lower quadrant. She was recently treated for UTI at this pain did not go away. Patient's last colonoscopy was in 2006 Family history is negative for colorectal carcinoma.  Past Medical History  Diagnosis Date  . PONV (postoperative nausea and vomiting)   . Stroke     jan 2012  . Hypertension   . Angina   . Hypercholesterolemia   . Numbness and tingling in right hand   . GERD (gastroesophageal reflux disease)   . Constipation   . Diarrhea   . Colon polyps   . Arthritis     Rheumatoid  . Cancer     left breast, cervical cancer    Past Surgical History  Procedure Date  . Right brain aneurysm 04/2010    Coiled   . Abdominal hysterectomy 1977  . Cholecystectomy   . Bilateral oophorectomy   . Left knee arthroscopy   . Right wrist surgery 1999    synevectomy  . Left breast lumpectomy 2004  . Appendectomy 1964    Family History  Problem Relation Age of Onset  . Colon cancer Neg Hx    Social History:  reports that she has quit smoking. She does not have any smokeless tobacco history on file. She reports that she does not drink alcohol or use illicit drugs.  Allergies:  Allergies  Allergen Reactions  . Aurothioglucose (Solganal) Other (See Comments)    REACTION: VASCULITIS   . Calcium Channel Blockers Palpitations  . Feldene (Piroxicam) Anaphylaxis and Other (See Comments)    SKIN BLISTERS  . Hydralazine Other (See Comments)    REACTION: Chest pains and headaches  . Imdur (Isosorbide Mononitrate) Other (See Comments)    REACTION: PATIENT IS UNABLE TO WALK OR FUNCTION  . Methotrexate Derivatives Other (See Comments)    REACTION: VASCULITIS  .  Thimerosal Other (See Comments)    SKIN BLISTERED  . Percocet (Oxycodone-Acetaminophen) Other (See Comments)    REACTION : INSOMNIA  . Vioxx (Rofecoxib) Other (See Comments)    REACTION: CAUSED MORE JOINT PAIN  . Arava (Leflunomide)     REACTION: UNKNOWN  . Asacol (Mesalamine) Other (See Comments)    REACTION: UNKNOWN  . Iohexol      Desc: PT STATES SHE WAS TOLD NEVER TO HAVE CT CONTRAST AGAIN.  SHE HAD SOME KIND OF REACTION BUT DOESNT REMEBER WHAT HAPPENED   . Lidocaine Other (See Comments)    REACTION: ONLY WITH THE PRESERVATIVES IN THE MEDICATION  . Sulfa Antibiotics     UNKNOWN  . Latex Rash  . Nickel Rash  . Nsaids Other (See Comments)    GI UPSET , CAN TOLERATE SOME NSAIDS    Medications Prior to Admission  Medication Dose Route Frequency Provider Last Rate Last Dose  . 0.45 % sodium chloride infusion   Intravenous Once Malissa Hippo, MD 20 mL/hr at 12/09/10 1005    . meperidine (DEMEROL) 50 MG/ML injection           . midazolam (VERSED) 5 MG/5ML injection            Medications Prior to Admission  Medication Sig Dispense Refill  . Abatacept (ORENCIA IV) Inject into the vein every 30 (thirty) days.       Marland Kitchen  amitriptyline (ELAVIL) 50 MG tablet Take 50 mg by mouth at bedtime.        Marland Kitchen aspirin EC 325 MG tablet Take 325 mg by mouth daily.        . Calcium Carbonate-Vit D-Min (CALTRATE 600+D PLUS) 600-400 MG-UNIT per tablet Chew 1 tablet by mouth 2 (two) times daily.        . cetirizine (ZYRTEC) 10 MG tablet Take 10 mg by mouth daily. OTC        . Cholecalciferol (VITAMIN D) 2000 UNITS tablet Take 2,000 Units by mouth daily.        . Coenzyme Q10 (CO Q-10) 200 MG CAPS Take 2 capsules by mouth daily.        . hydroxychloroquine (PLAQUENIL) 200 MG tablet Take 200 mg by mouth 2 (two) times daily.        . lansoprazole (PREVACID) 15 MG capsule Take 15 mg by mouth daily.        Marland Kitchen losartan (COZAAR) 50 MG tablet Take 50 mg by mouth daily.        . magnesium oxide (MAG-OX) 400 MG  tablet Take 400 mg by mouth daily.        . meloxicam (MOBIC) 15 MG tablet Take 15 mg by mouth daily.        . methylPREDNISolone (MEDROL) 4 MG tablet Take 4 mg by mouth daily.        . Nebivolol HCl (BYSTOLIC) 20 MG TABS Take 1 tablet by mouth daily.        Bertram Gala Glycol-Propyl Glycol (SYSTANE) 0.4-0.3 % GEL Apply to eye.        . pravastatin (PRAVACHOL) 40 MG tablet Take 40 mg by mouth daily.        . traMADol (ULTRAM) 50 MG tablet Take 50 mg by mouth every 6 (six) hours as needed. FOR PAIN         No results found for this or any previous visit (from the past 48 hour(s)). No results found.  Review of Systems  Constitutional: Negative for weight loss.  Gastrointestinal: Negative for diarrhea, constipation, blood in stool and melena. Abdominal pain: intermittent burning pain in right lower quadrant.    Blood pressure 143/66, pulse 68, temperature 98.6 F (37 C), temperature source Oral, resp. rate 16, height 5' 7.5" (1.715 m), weight 220 lb (99.791 kg), SpO2 99.00%. Physical Exam  Constitutional: She appears well-developed and well-nourished.  HENT:  Mouth/Throat: Oropharynx is clear and moist.  Eyes: Conjunctivae are normal. No scleral icterus.  Neck: No thyromegaly present.  Cardiovascular: Normal rate, regular rhythm and normal heart sounds.   Respiratory: Effort normal and breath sounds normal.  GI: Soft. She exhibits no mass. There is Tenderness: mild tenderness at right lower quadrant just underneath appendectomy scar..  Musculoskeletal: She exhibits no edema.  Lymphadenopathy:    She has no cervical adenopathy.  Neurological: She is alert.  Skin: Skin is warm.     Assessment/Plan History of colonic polyps. Surveillance colonoscopy  Fawaz Borquez U 12/09/2010, 10:34 AM

## 2010-12-14 ENCOUNTER — Other Ambulatory Visit: Payer: Self-pay | Admitting: Internal Medicine

## 2010-12-14 DIAGNOSIS — K219 Gastro-esophageal reflux disease without esophagitis: Secondary | ICD-10-CM

## 2010-12-14 MED ORDER — LANSOPRAZOLE 30 MG PO TBDP: 30.0000 mg | ORAL_TABLET | Freq: Every day | ORAL | Status: DC

## 2010-12-15 ENCOUNTER — Encounter (HOSPITAL_COMMUNITY): Payer: Self-pay | Admitting: Internal Medicine

## 2010-12-17 ENCOUNTER — Encounter (INDEPENDENT_AMBULATORY_CARE_PROVIDER_SITE_OTHER): Payer: Self-pay | Admitting: *Deleted

## 2011-02-22 ENCOUNTER — Ambulatory Visit: Payer: BC Managed Care – PPO | Admitting: Gynecologic Oncology

## 2011-05-25 ENCOUNTER — Emergency Department (HOSPITAL_COMMUNITY)
Admission: EM | Admit: 2011-05-25 | Discharge: 2011-05-26 | Disposition: A | Discharge status: Home / Self Care | Payer: BC Managed Care – PPO | Attending: Emergency Medicine | Admitting: Emergency Medicine

## 2011-05-25 ENCOUNTER — Encounter (HOSPITAL_COMMUNITY): Payer: Self-pay | Admitting: *Deleted

## 2011-05-25 ENCOUNTER — Emergency Department (HOSPITAL_COMMUNITY): Payer: BC Managed Care – PPO

## 2011-05-25 DIAGNOSIS — R29898 Other symptoms and signs involving the musculoskeletal system: Secondary | ICD-10-CM | POA: Insufficient documentation

## 2011-05-25 DIAGNOSIS — R209 Unspecified disturbances of skin sensation: Secondary | ICD-10-CM | POA: Insufficient documentation

## 2011-05-25 DIAGNOSIS — Z79899 Other long term (current) drug therapy: Secondary | ICD-10-CM | POA: Insufficient documentation

## 2011-05-25 DIAGNOSIS — I10 Essential (primary) hypertension: Secondary | ICD-10-CM | POA: Insufficient documentation

## 2011-05-25 DIAGNOSIS — R55 Syncope and collapse: Secondary | ICD-10-CM | POA: Insufficient documentation

## 2011-05-25 DIAGNOSIS — S86919A Strain of unspecified muscle(s) and tendon(s) at lower leg level, unspecified leg, initial encounter: Secondary | ICD-10-CM

## 2011-05-25 DIAGNOSIS — IMO0002 Reserved for concepts with insufficient information to code with codable children: Secondary | ICD-10-CM | POA: Insufficient documentation

## 2011-05-25 DIAGNOSIS — W19XXXA Unspecified fall, initial encounter: Secondary | ICD-10-CM | POA: Insufficient documentation

## 2011-05-25 DIAGNOSIS — Z9889 Other specified postprocedural states: Secondary | ICD-10-CM | POA: Insufficient documentation

## 2011-05-25 DIAGNOSIS — I6529 Occlusion and stenosis of unspecified carotid artery: Secondary | ICD-10-CM | POA: Insufficient documentation

## 2011-05-25 DIAGNOSIS — M069 Rheumatoid arthritis, unspecified: Secondary | ICD-10-CM | POA: Insufficient documentation

## 2011-05-25 DIAGNOSIS — G459 Transient cerebral ischemic attack, unspecified: Secondary | ICD-10-CM | POA: Insufficient documentation

## 2011-05-25 DIAGNOSIS — E7800 Pure hypercholesterolemia, unspecified: Secondary | ICD-10-CM | POA: Insufficient documentation

## 2011-05-25 DIAGNOSIS — S838X9A Sprain of other specified parts of unspecified knee, initial encounter: Secondary | ICD-10-CM | POA: Insufficient documentation

## 2011-05-25 DIAGNOSIS — M25569 Pain in unspecified knee: Secondary | ICD-10-CM | POA: Insufficient documentation

## 2011-05-25 DIAGNOSIS — K219 Gastro-esophageal reflux disease without esophagitis: Secondary | ICD-10-CM | POA: Insufficient documentation

## 2011-05-25 LAB — BASIC METABOLIC PANEL
BUN: 16 mg/dL (ref 6–23)
Calcium: 9.7 mg/dL (ref 8.4–10.5)
Creatinine, Ser: 0.81 mg/dL (ref 0.50–1.10)
GFR calc Af Amer: 88 mL/min — ABNORMAL LOW (ref >90)
GFR calc non Af Amer: 76 mL/min — ABNORMAL LOW (ref >90)
Potassium: 4.2 mEq/L (ref 3.5–5.1)

## 2011-05-25 LAB — DIFFERENTIAL
Basophils Relative: 0 % (ref 0–1)
Eosinophils Absolute: 0.2 10*3/uL (ref 0.0–0.7)
Eosinophils Relative: 1 % (ref 0–5)
Lymphocytes Relative: 18 % (ref 12–46)
Lymphs Abs: 2.3 10*3/uL (ref 0.7–4.0)
Neutro Abs: 9.2 10*3/uL — ABNORMAL HIGH (ref 1.7–7.7)

## 2011-05-25 LAB — CBC
HCT: 36.7 % (ref 36.0–46.0)
MCV: 84 fL (ref 78.0–100.0)
Platelets: 301 10*3/uL (ref 150–400)
RBC: 4.37 MIL/uL (ref 3.87–5.11)
RDW: 15.9 % — ABNORMAL HIGH (ref 11.5–15.5)
WBC: 12.3 10*3/uL — ABNORMAL HIGH (ref 4.0–10.5)

## 2011-05-25 MED ORDER — ACETAMINOPHEN 325 MG PO TABS
650.0000 mg | ORAL_TABLET | Freq: Once | ORAL | Status: AC
  Administered 2011-05-25: 650 mg via ORAL
  Filled 2011-05-25: qty 2

## 2011-05-25 MED ORDER — GADOBENATE DIMEGLUMINE 529 MG/ML IV SOLN
20.0000 mL | Freq: Once | INTRAVENOUS | Status: AC | PRN
  Administered 2011-05-25: 20 mL via INTRAVENOUS

## 2011-05-25 NOTE — Discharge Instructions (Signed)
PLEASE RETURN FOR NEW ONSET WEAKNESS OR NUMBNESS, SLURRED SPEECH, CHEST PAIN, SHORTNESS OF BREATH OR VISUAL CHANGES OVER THE NEXT 24 HOURS

## 2011-05-25 NOTE — ED Notes (Signed)
Pt reports at approx 11am she noticed some weakness in r hand while trying to punch in numbers on a keypad.  Says weakness only lasted for a few seconds then was able to use hand like normal.  Reports walked outside around 1300 and staff saw patient fall to knees and roll over on the ground.  EMS says when staff reached pt she was awake, alert, and oriented.  Pt says remembers going down on her left knee then waking up on her back.  EMS reports pt was hypertensive upon their arrival.  Pt presently 169/ 69.  Pt alert and oriented, denies headache.  Reprots history of stroke in Jan 2012 and also found that she had a brain aneurysm.  Reports in March 2012 they coiled it.

## 2011-05-25 NOTE — ED Provider Notes (Signed)
History   This chart was scribed for Monica Gaskins, MD by [name]. The patient was seen in room APA09/APA09. Patient's care was started at 1404.   CSN: [id number]  Arrival date & time 05/25/11  1404   First MD Initiated Contact with Patient 05/25/11 1412      Chief Complaint  Patient presents with  . Near Syncope  . Knee Pain     HPI  [name] is a 61 y.o. female who presents to the Emergency Department for evaluation from a fall about midday following an episode of syncope. Patient was walking outside of lunch when she lost consciousness and fell, landing on her left knee, this was episode was brief and no seizure/incontinence reported.  Patient was feeling ok before work but noted a brief episode of numbness and weakness in her right hand this morning that was less than 5 minutes. Patient says the episodes of numbness in her hand started after a stroke about two years ago, and occur when her blood pressure is low. Her course is improving Symptoms improved by - nothing Worsened by - nothing  She denies cp/sob/headache/visual changes No slurred speech No focal weakness at this time No dizziness reported    Past Medical History  Diagnosis Date  . PONV (postoperative nausea and vomiting)   . Stroke     jan 2012  . Hypertension   . Angina   . Hypercholesterolemia   . Numbness and tingling in right hand   . GERD (gastroesophageal reflux disease)   . Constipation   . Diarrhea   . Colon polyps   . Arthritis     Rheumatoid  . Cancer     left breast, cervical cancer    Past Surgical History  Procedure Date  . Right brain aneurysm 04/2010    Coiled   . Abdominal hysterectomy 1977  . Cholecystectomy   . Bilateral oophorectomy   . Left knee arthroscopy   . Right wrist surgery 1999    synevectomy  . Left breast lumpectomy 2004  . Appendectomy 1964  . Colonoscopy 12/09/2010    Procedure: COLONOSCOPY;  Surgeon: Malissa Hippo, MD;  Location: [facility];  Service: Endoscopy;  Laterality: N/A;    Family History  Problem Relation Age of Onset  . Colon cancer Neg Hx     History  Substance Use Topics  . Smoking status: Former Smoker -- 2.0 packs/day for 15 years  . Smokeless tobacco: Not on file   Comment: Quit 1986  . Alcohol Use: No    OB History    Grav Para Term Preterm Abortions TAB SAB Ect Mult Living                  Review of Systems A complete 10 system review of systems was obtained and all systems are negative except as noted in the HPI and PMH.   Allergies  Aurothioglucose; Calcium channel blockers; Feldene; Hydralazine; Imdur; Methotrexate derivatives; Thimerosal; Percocet; Vioxx; Arava; Asacol; Iohexol; Lidocaine; Sulfa antibiotics; Latex; Nickel; and Nsaids  Home Medications   Current Outpatient Rx  Name Route Sig Dispense Refill  . ORENCIA IV Intravenous Inject into the vein every 30 (thirty) days.     . AMITRIPTYLINE HCL 50 MG PO TABS Oral Take 50 mg by mouth at bedtime.      Marland Kitchen CALTRATE 600+D PLUS 600-400 MG-UNIT PO CHEW Oral Chew 1 tablet by mouth 2 (two) times daily.      Marland Kitchen  CETIRIZINE HCL 10 MG PO TABS Oral Take 10 mg by mouth daily. OTC      . VITAMIN D 2000 UNITS PO TABS Oral Take 2,000 Units by mouth daily.      . CO Q-10 200 MG PO CAPS Oral Take 2 capsules by mouth daily.      Marland Kitchen HYDROXYCHLOROQUINE SULFATE 200 MG PO TABS Oral Take 200 mg by mouth 2 (two) times daily.      Marland Kitchen LANSOPRAZOLE 30 MG PO TBDP Oral Take 1 tablet (30 mg total) by mouth daily. 30 tablet 11  . LANSOPRAZOLE 15 MG PO CPDR Oral Take 15 mg by mouth daily.      Marland Kitchen LOSARTAN POTASSIUM 50 MG PO TABS Oral Take 50 mg by mouth daily.      Marland Kitchen MAGNESIUM OXIDE 400 MG PO TABS Oral Take 400 mg by mouth daily.      . MELOXICAM 15 MG PO TABS Oral Take 15 mg by mouth daily.      . METHYLPREDNISOLONE 4 MG PO TABS Oral Take 4 mg by mouth daily.      . NEBIVOLOL HCL 20 MG PO TABS Oral Take 1 tablet by mouth daily.      Marland Kitchen POLYETHYL GLYCOL-PROPYL  GLYCOL 0.4-0.3 % OP GEL Ophthalmic Apply to eye.      Marland Kitchen PRAVASTATIN SODIUM 40 MG PO TABS Oral Take 40 mg by mouth daily.      . TRAMADOL HCL 50 MG PO TABS Oral Take 50 mg by mouth every 6 (six) hours as needed. FOR PAIN       BP 169/69  Pulse 69  Temp(Src) 99.2 F (37.3 C) (Oral)  Resp 18  SpO2 95% BP 187/68  Pulse 71  Temp(Src) 98.2 F (36.8 C) (Oral)  Resp 18  SpO2 99%   Physical Exam CONSTITUTIONAL: Well developed/well nourished HEAD AND FACE: Normocephalic/atraumatic EYES: EOMI/PERRL ENMT: Mucous membranes moist NECK: supple no meningeal signs SPINE:entire spine nontender CV: S1/S2 noted, no murmurs/rubs/gallops noted LUNGS: Lungs are clear to auscultation bilaterally, no apparent distress ABDOMEN: soft, nontender, no rebound or guarding GU:no cva tenderness NEURO: Pt is awake/alert, moves all extremities x4. No arm or leg drift noted, no facial droop.  EXTREMITIES: pulses normal, full ROM.  Tenderness to right patella, small abrasion noted SKIN: warm, color normal Slight abrasion to left knee. PSYCH: no abnormalities of mood noted  ED Course  Procedures  DIAGNOSTIC STUDIES: Oxygen Saturation is 95% on room air, adequate by my interpretation.    COORDINATION OF CARE: 2:25PM Patient informed of current plan for treatment and evaluation and agrees with plan at this time. Going to review blood work and EKG.   4:54 PM Pt with h/o TIA last yr due to carotid stenosis (left) pt also reports right carotid stenosis . She reported right hand weakness about 2 hrs prior to syncopal event Currently without focal neuro deficits Also had normal echo last yr Will consult teleneuro for further guidance 5:42 PM D/w teleneuro Reports since she has no HA and no focal weakness, unlikely this is SAH (has h/o aneurysm) and unlikely TIA causing her syncope Recommends MRI/MRA head/neck 6:11 PM Just d/w family, pt had coil placed in aneurysm in past yr D/w mri tech she will talk to  radiologist if she can receive mri 6:59 PM Pt had mild HA during mri, this was stopped and we are awaiting info on type of coil Pt is awake/alert, no distress, well appearing Awaiting results from [facility] 7:50 PM I  have placed a call to the physician at [facility] to find out if coil is MRI compatible 8:01 PM I spoke to Dr Lanna Poche, the physician who placed this coil.  He reports her coils are MRI compatible 11:01 PM Spoke to teleneuro Given MR findings and right hand weakness, recommend admission due to ICA stenosis and vascular evaluatin  I had long discussion with patient and her spouse She reports she will infrequently drop things with right hand, this is not a new phenomenon and pt reports this is similar to prior episodes.  She reports that since her coiling, she has seen her local neurologist (sethi) who is aware of complete left ICA stenosis.  She reports he informed her that it is unlikely amenable to intervention and there are no immediate plans for repair.  I reviewed her reports from [place], and it was noted at that time she had right hand weakness and likely due to ICA stenosis.  At that time it was mentioned to perform repair it was deferred at that time. Pt would like to go home and f/u with dr Pearlean Brownie.  I advised that given her history, stroke is a possibility  For her syncopal event, I doubt this was ACS/PE or other acute cardiovascular process    MDM  Nursing notes reviewed and considered in documentation All labs/vitals reviewed and considered Previous records reviewed and considered xrays reviewed and considered     Date: 05/25/2011  Rate: 71  Rhythm: normal sinus rhythm  QRS Axis: normal  Intervals: normal  ST/T Wave abnormalities: nonspecific ST changes  Conduction Disutrbances:none  Narrative Interpretation:   Old EKG Reviewed: unchanged     I personally performed the services described in this documentation, which was scribed in my presence. The recorded  information has been reviewed and considered.   Monica Gaskins, MD 05/25/11 [phone]

## 2011-05-25 NOTE — ED Notes (Signed)
Was stepping outside at work and was seen falling to knees. Was rolling over and answering questions. No known complete loss of consciousness. Abrasion to left knee.

## 2011-05-26 NOTE — ED Notes (Signed)
Pt alert & oriented x4, stable gait. Pt given discharge instructions, paperwork & prescription(s). Patient instructed to stop at the registration desk to finish any additional paperwork. pt verbalized understanding. Pt left department w/ no further questions.  

## 2011-08-09 ENCOUNTER — Ambulatory Visit (HOSPITAL_COMMUNITY): Admission: RE | Admit: 2011-08-09 | Payer: BC Managed Care – PPO | Source: Ambulatory Visit

## 2011-08-23 ENCOUNTER — Ambulatory Visit (HOSPITAL_COMMUNITY)
Admission: RE | Admit: 2011-08-23 | Discharge: 2011-08-23 | Disposition: A | Discharge status: Home / Self Care | Payer: BC Managed Care – PPO | Source: Ambulatory Visit | Attending: Neurology | Admitting: Neurology

## 2011-08-23 DIAGNOSIS — I63219 Cerebral infarction due to unspecified occlusion or stenosis of unspecified vertebral arteries: Secondary | ICD-10-CM

## 2011-08-23 DIAGNOSIS — I6529 Occlusion and stenosis of unspecified carotid artery: Secondary | ICD-10-CM | POA: Insufficient documentation

## 2011-08-23 DIAGNOSIS — I671 Cerebral aneurysm, nonruptured: Secondary | ICD-10-CM | POA: Insufficient documentation

## 2011-08-23 NOTE — Progress Notes (Signed)
Bilateral carotid artery duplex completed.  Preliminary report is no evidence of significant ICA stenosis.  Severely diminished flow noted in the left ICA consistent with possible more distal occlusion vs. dissection.

## 2011-08-23 NOTE — Progress Notes (Addendum)
TCD completed. 

## 2011-09-14 ENCOUNTER — Other Ambulatory Visit: Payer: Self-pay | Admitting: Internal Medicine

## 2011-09-20 ENCOUNTER — Ambulatory Visit
Admission: RE | Admit: 2011-09-20 | Discharge: 2011-09-20 | Disposition: A | Discharge status: Home / Self Care | Payer: BC Managed Care – PPO | Source: Ambulatory Visit | Attending: Internal Medicine | Admitting: Internal Medicine

## 2011-10-10 ENCOUNTER — Other Ambulatory Visit (INDEPENDENT_AMBULATORY_CARE_PROVIDER_SITE_OTHER): Payer: Self-pay | Admitting: Internal Medicine

## 2011-10-27 ENCOUNTER — Encounter (INDEPENDENT_AMBULATORY_CARE_PROVIDER_SITE_OTHER): Payer: Self-pay | Admitting: Internal Medicine

## 2011-10-27 ENCOUNTER — Ambulatory Visit (INDEPENDENT_AMBULATORY_CARE_PROVIDER_SITE_OTHER): Payer: 59 | Admitting: Internal Medicine

## 2011-10-27 VITALS — BP 134/70 | HR 64 | Temp 98.0°F | Ht 67.5 in | Wt 216.2 lb

## 2011-10-27 DIAGNOSIS — M069 Rheumatoid arthritis, unspecified: Secondary | ICD-10-CM

## 2011-10-27 DIAGNOSIS — I10 Essential (primary) hypertension: Secondary | ICD-10-CM | POA: Insufficient documentation

## 2011-10-27 DIAGNOSIS — K219 Gastro-esophageal reflux disease without esophagitis: Secondary | ICD-10-CM

## 2011-10-27 DIAGNOSIS — E7800 Pure hypercholesterolemia, unspecified: Secondary | ICD-10-CM | POA: Insufficient documentation

## 2011-10-27 MED ORDER — SUCRALFATE 1 G PO TABS: 1.0000 g | ORAL_TABLET | Freq: Four times a day (QID) | ORAL | Status: DC

## 2011-10-27 NOTE — Patient Instructions (Addendum)
Prevacid 30mg  BID. Carafate 1gm QID. OV 3 months

## 2011-10-27 NOTE — Progress Notes (Signed)
Subjective:     Patient ID: [name], female   DOB: 09-12-1950, 61 y.o.   MRN: [id number]  HPI [name] presents today with c/o problems with her stomach.  She c/o epigastric pain radiating into her back.   She also c/o rt flank pain., She says when she drinks water or liquid. This causes pain and it feels like someone is stabbing her in her back. She has had these symptoms for a month. She cannot tell if this is acid reflux. Sometimes liquids will bubble up into her esohpagus. Sometimes the pain radiates into her esophagus.  This pain has occurred before. She can take Mylanta and her pain will resolve. Appetite is good. No weight loss. She usually has a BM 1-3 or sometimes she had non daily.  No rectal bleeding or melena.    She had a normal EGD in 2006. She had a non-invasive cardiac exam by Dr. Juanetta Gosling 10 yrs ago and was negative.   Review of Systems see hpi Current Outpatient Prescriptions  Medication Sig Dispense Refill  . amitriptyline (ELAVIL) 50 MG tablet Take 50 mg by mouth at bedtime.        Marland Kitchen aspirin EC 325 MG tablet Take 325 mg by mouth daily.      . Calcium Carbonate-Vit D-Min (CALTRATE 600+D PLUS) 600-400 MG-UNIT per tablet Chew 1 tablet by mouth 2 (two) times daily.        . cetirizine (ZYRTEC) 10 MG tablet Take 10 mg by mouth at bedtime. OTC       . Cholecalciferol (VITAMIN D) 2000 UNITS tablet Take 2,000 Units by mouth daily.       . Coenzyme Q10 (CO Q-10) 200 MG CAPS Take 2 capsules by mouth daily.        . hydroxychloroquine (PLAQUENIL) 200 MG tablet Take 200 mg by mouth 2 (two) times daily.        Marland Kitchen losartan (COZAAR) 50 MG tablet Take 50 mg by mouth at bedtime.       . magnesium oxide (MAG-OX) 400 MG tablet Take 400 mg by mouth daily.        . meloxicam (MOBIC) 15 MG tablet Take 15 mg by mouth daily.        . methylPREDNISolone (MEDROL) 4 MG tablet Take 4 mg by mouth daily.        . Nebivolol HCl (BYSTOLIC) 20 MG TABS Take 1 tablet by mouth daily.        Bertram Gala Glycol-Propyl Glycol (SYSTANE) 0.4-0.3 % GEL Apply 1 application to eye daily as needed. For dry eye relief      . pravastatin (PRAVACHOL) 40 MG tablet Take 60 mg by mouth at bedtime.       Marland Kitchen PREVACID 30 MG capsule TAKE ONE CAPSULE DAILY.  30 each  11  . traMADol (ULTRAM) 50 MG tablet Take 50 mg by mouth every 6 (six) hours as needed. FOR PAIN       . Abatacept (ORENCIA IV) Inject into the vein every 30 (thirty) days.        Past Medical History  Diagnosis Date  . PONV (postoperative nausea and vomiting)   . Stroke     jan 2012  . Hypertension   . Angina   . Hypercholesterolemia   . Numbness and tingling in right hand   . GERD (gastroesophageal reflux disease)   . Constipation   . Diarrhea   . Colon polyps   . Arthritis  Rheumatoid  . Cancer     left breast, cervical cancer   Past Surgical History  Procedure Date  . Right brain aneurysm 04/2010    Coiled   . Abdominal hysterectomy 1977  . Cholecystectomy   . Bilateral oophorectomy   . Left knee arthroscopy   . Right wrist surgery 1999    synevectomy  . Left breast lumpectomy 2004  . Appendectomy 1964  . Colonoscopy 12/09/2010    Procedure: COLONOSCOPY;  Surgeon: Malissa Hippo, MD;  Location: [facility];  Service: Endoscopy;  Laterality: N/A;   History   Social History  . Marital Status: Married    Spouse Name: N/A    Number of Children: N/A  . Years of Education: N/A   Occupational History  . Not on file.   Social History Main Topics  . Smoking status: Former Smoker -- 2.0 packs/day for 15 years  . Smokeless tobacco: Not on file   Comment: Quit 1986  . Alcohol Use: No  . Drug Use: No  . Sexually Active:    Other Topics Concern  . Not on file   Social History Narrative  . No narrative on file   Family Status  Relation Status Death Age  . Mother Alive     good health  . Father Deceased     MI age 33  . Brother Alive     good health   Allergies  Allergen Reactions  .  Aurothioglucose (Solganal) Other (See Comments)    REACTION: VASCULITIS   . Calcium Channel Blockers Palpitations  . Feldene (Piroxicam) Anaphylaxis and Other (See Comments)    SKIN BLISTERS  . Hydralazine Other (See Comments)    REACTION: Chest pains and headaches: IV only  . Imdur (Isosorbide Mononitrate) Other (See Comments)    REACTION: PATIENT IS UNABLE TO WALK OR FUNCTION  . Methotrexate Derivatives Other (See Comments)    REACTION: VASCULITIS  . Thimerosal Other (See Comments)    SKIN BLISTERED  . Percocet (Oxycodone-Acetaminophen) Other (See Comments)    REACTION : INSOMNIA  . Vioxx (Rofecoxib) Other (See Comments)    REACTION: CAUSED MORE JOINT PAIN  . Arava (Leflunomide)     REACTION: UNKNOWN  . Asacol (Mesalamine) Other (See Comments)    REACTION: UNKNOWN  . Iohexol      Desc: PT STATES SHE WAS TOLD NEVER TO HAVE CT CONTRAST AGAIN.  SHE HAD SOME KIND OF REACTION BUT DOESNT REMEBER WHAT HAPPENED   . Lidocaine Other (See Comments)    REACTION: ONLY WITH THE PRESERVATIVES IN THE MEDICATION  . Sulfa Antibiotics     UNKNOWN  . Latex Rash  . Nickel Rash  . Nsaids Other (See Comments)    GI UPSET , CAN TOLERATE SOME NSAIDS        Objective:   Physical Exam Filed Vitals:   10/27/11 1010  BP: 134/70  Pulse: 64  Temp: 98 F (36.7 C)  Height: 5' 7.5" (1.715 m)  Weight: 216 lb 3.2 oz (98.068 kg)   Alert and oriented. Skin warm and dry. Oral mucosa is moist.   . Sclera anicteric, conjunctivae is pink. Thyroid not enlarged. No cervical lymphadenopathy. Lungs clear. Heart regular rate and rhythm.  Abdomen is soft. Bowel sounds are positive. No hepatomegaly. No abdominal masses felt. No tenderness.  No edema to lower extremities. Patient is alert and oriented.      Assessment:    Uncontrolled GERD . She has tried multiple PPI without relief.  Plan:    Continue Prevacid. Carafate 1gm QID

## 2012-01-26 ENCOUNTER — Ambulatory Visit (INDEPENDENT_AMBULATORY_CARE_PROVIDER_SITE_OTHER): Payer: 59 | Admitting: Internal Medicine

## 2012-01-26 ENCOUNTER — Encounter (INDEPENDENT_AMBULATORY_CARE_PROVIDER_SITE_OTHER): Payer: Self-pay | Admitting: Internal Medicine

## 2012-01-26 VITALS — BP 126/40 | HR 68 | Temp 98.5°F | Ht 67.5 in | Wt 214.0 lb

## 2012-01-26 DIAGNOSIS — K219 Gastro-esophageal reflux disease without esophagitis: Secondary | ICD-10-CM

## 2012-01-26 NOTE — Progress Notes (Signed)
Subjective:     Patient ID: [name], female   DOB: January 04, 1951, 61 y.o.   MRN: [id number]  HPIHere today for a scheduled visit.  She says her joints are severely inflamed. She received steroid injections in each knee which only lasted a few days. She see Dr. Alben Deeds. Acid reflux is better. She has it off an on.  Appetite is fairly good. No abdominal pain.  She has a BM 1-3 a day. No rectal bleeding or melena. Her stools are more formed.     2011 Colonoscopy: Normal terminal ileum.  7-8 mm sessile polyp at ileocecal valve. This could be prolapsed ileal mucosa but had different texture. Therefore it was snared.  Few small diverticula at sigmoid colon.     Review of Systems Current Outpatient Prescriptions  Medication Sig Dispense Refill  . Abatacept (ORENCIA IV) Inject into the vein every 30 (thirty) days.       Marland Kitchen amitriptyline (ELAVIL) 50 MG tablet Take 50 mg by mouth at bedtime.        Marland Kitchen aspirin EC 325 MG tablet Take 325 mg by mouth daily.      . Calcium Carbonate-Vit D-Min (CALTRATE 600+D PLUS) 600-400 MG-UNIT per tablet Chew 1 tablet by mouth 2 (two) times daily.        . cetirizine (ZYRTEC) 10 MG tablet Take 10 mg by mouth at bedtime. OTC       . Cholecalciferol (VITAMIN D) 2000 UNITS tablet Take 2,000 Units by mouth daily.       . Coenzyme Q10 (CO Q-10) 200 MG CAPS Take 2 capsules by mouth daily.        . hydroxychloroquine (PLAQUENIL) 200 MG tablet Take 200 mg by mouth 2 (two) times daily.        Marland Kitchen losartan (COZAAR) 50 MG tablet Take 50 mg by mouth at bedtime.       . magnesium oxide (MAG-OX) 400 MG tablet Take 400 mg by mouth daily.        . meloxicam (MOBIC) 15 MG tablet Take 15 mg by mouth daily.        . methylPREDNISolone (MEDROL) 4 MG tablet Take 8 mg by mouth daily.       . Nebivolol HCl (BYSTOLIC) 20 MG TABS Take 1 tablet by mouth daily.        Bertram Gala Glycol-Propyl Glycol (SYSTANE) 0.4-0.3 % GEL Apply 1 application to eye daily as needed. For dry eye  relief      . pravastatin (PRAVACHOL) 40 MG tablet Take 60 mg by mouth at bedtime.       Marland Kitchen PREVACID 30 MG capsule TAKE ONE CAPSULE DAILY.  30 each  11  . sucralfate (CARAFATE) 1 G tablet Take 1 tablet (1 g total) by mouth 4 (four) times daily.  120 tablet  1  . traMADol (ULTRAM) 50 MG tablet Take 50 mg by mouth every 6 (six) hours as needed. FOR PAIN            Objective:   Physical Exam  Filed Vitals:   01/26/12 0944  BP: 126/40  Pulse: 68  Temp: 98.5 F (36.9 C)  Height: 5' 7.5" (1.715 m)  Weight: 214 lb (97.07 kg)  Alert and oriented. Skin warm and dry. Oral mucosa is moist.   . Sclera anicteric, conjunctivae is pink. Thyroid not enlarged. No cervical lymphadenopathy. Lungs clear. Heart regular rate and rhythm.  Abdomen is soft. Bowel sounds are positive. No hepatomegaly. No abdominal masses  felt. No tenderness.  No edema to lower extremities. Patient is alert and oriented.       Assessment:    GERD well controlled at this time.    Plan:    OV in 1 yr.Any problems call our office

## 2012-01-26 NOTE — Patient Instructions (Addendum)
Continue Prevacid. OV in 1 yr.  Any problems call our office

## 2012-02-25 ENCOUNTER — Other Ambulatory Visit (INDEPENDENT_AMBULATORY_CARE_PROVIDER_SITE_OTHER): Payer: Self-pay | Admitting: Internal Medicine

## 2012-07-10 ENCOUNTER — Other Ambulatory Visit (INDEPENDENT_AMBULATORY_CARE_PROVIDER_SITE_OTHER): Payer: Self-pay | Admitting: Internal Medicine

## 2012-07-25 ENCOUNTER — Other Ambulatory Visit (HOSPITAL_COMMUNITY): Payer: Self-pay | Admitting: Internal Medicine

## 2012-07-25 DIAGNOSIS — R609 Edema, unspecified: Secondary | ICD-10-CM

## 2012-07-25 DIAGNOSIS — R52 Pain, unspecified: Secondary | ICD-10-CM

## 2012-07-27 ENCOUNTER — Ambulatory Visit (HOSPITAL_COMMUNITY)
Admission: RE | Admit: 2012-07-27 | Discharge: 2012-07-27 | Disposition: A | Discharge status: Home / Self Care | Payer: 59 | Source: Ambulatory Visit | Attending: Internal Medicine | Admitting: Internal Medicine

## 2012-07-27 DIAGNOSIS — M7120 Synovial cyst of popliteal space [Baker], unspecified knee: Secondary | ICD-10-CM | POA: Insufficient documentation

## 2012-07-27 DIAGNOSIS — R609 Edema, unspecified: Secondary | ICD-10-CM

## 2012-07-27 DIAGNOSIS — R52 Pain, unspecified: Secondary | ICD-10-CM

## 2012-07-27 DIAGNOSIS — M79609 Pain in unspecified limb: Secondary | ICD-10-CM | POA: Insufficient documentation

## 2012-08-14 ENCOUNTER — Encounter: Payer: Self-pay | Admitting: Neurology

## 2012-08-14 DIAGNOSIS — I671 Cerebral aneurysm, nonruptured: Secondary | ICD-10-CM | POA: Insufficient documentation

## 2012-08-14 DIAGNOSIS — G459 Transient cerebral ischemic attack, unspecified: Secondary | ICD-10-CM | POA: Insufficient documentation

## 2012-08-14 DIAGNOSIS — R531 Weakness: Secondary | ICD-10-CM

## 2012-08-23 ENCOUNTER — Encounter: Payer: Self-pay | Admitting: Neurology

## 2012-08-23 ENCOUNTER — Ambulatory Visit (INDEPENDENT_AMBULATORY_CARE_PROVIDER_SITE_OTHER): Payer: 59 | Admitting: Neurology

## 2012-08-23 DIAGNOSIS — I6529 Occlusion and stenosis of unspecified carotid artery: Secondary | ICD-10-CM

## 2012-08-23 DIAGNOSIS — R413 Other amnesia: Secondary | ICD-10-CM

## 2012-08-23 DIAGNOSIS — G454 Transient global amnesia: Secondary | ICD-10-CM

## 2012-08-23 NOTE — Patient Instructions (Signed)
She was advised to check EEG, MRI scan for her episode of transient global amnesia and carotid ultrasound for followup for carotid occlusion. Continue aspirin for stroke prevention and strict control of hypertension with blood pressure goal below 130/90. Followup with primary physician Dr. Pearson Grippe for her dizziness and hypertension and adjustment of blood pressure medicines. Return for followup in 6 months Larita Fife, NP

## 2012-08-23 NOTE — Progress Notes (Signed)
[facility] [address] [place]. [place] [address] [phone]       OFFICE FOLLOW-UP NOTE  [name] Date of Birth:  [birth year] Medical Record Number:  [id number]   HPI: 62 year old lady with left hemispheric TIA in January 2012 from left internal carotid artery occlusion. Asymptomatic right posterior commuting artery 7.5 x 4.5 mm aneurysm status post coiling on 05/11/11 by Dr Lanna Poche. 08/23/12 she returns today for followup of the last visit on 01/26/12. She continues to do well without any recurrent stroke or TIA symptoms. She is tolerating aspirin quite well with only minor bruising and no bleeding episodes. She states she had a cholesterol checked about a month ago by her primary physician and it was great. She has found that in the last few weeks her blood pressure has been running low and she has been complaining of lightheadedness and dizziness as well as some drifting of the right arm when this happens. She plans to discuss this with her primary physician and reduce the blood pressure medicines. She has a new complaint of episode of memory loss she had a few months ago. She states that she remembers no driving out off the driveway after getting a haircut and the next thing she remembers is she ended up somewhere else on the a different part of the city. She apparently drove around for 30 minutes and had no recollection as to what she was thinking of doing. She did not have any accidents. She clock. denied feeling confused or having headache or tightness after this. She did not mention this to her husband are seeking medical advice prior to today. She has never had an episode like this before. She does not report any significant cognitive decline and memory problems or trouble doing her activities of daily living. On mental status exam today she had only one deficit out of 3 in because and was able to name 9 animals and had no difficulty constructing a ROS:   14 system  review of systems is positive for joint pain and easy bruising only.  PMH:  Past Medical History  Diagnosis Date  . PONV (postoperative nausea and vomiting)   . Stroke     jan 2012  . Hypertension   . Angina   . Hypercholesterolemia   . Numbness and tingling in right hand   . GERD (gastroesophageal reflux disease)   . Constipation   . Diarrhea   . Colon polyps   . Arthritis     Rheumatoid  . Cancer     left breast, cervical cancer    Social History:  History   Social History  . Marital Status: Married    Spouse Name: N/A    Number of Children: N/A  . Years of Education: N/A   Occupational History  . Not on file.   Social History Main Topics  . Smoking status: Former Smoker -- 2.00 packs/day for 15 years  . Smokeless tobacco: Not on file     Comment: Quit 1986  . Alcohol Use: No  . Drug Use: No  . Sexually Active:    Other Topics Concern  . Not on file   Social History Narrative   Employed with [employer]. Married with no children. Quit smoking 1986. No alcohol or illicit drug use.     Medications:   Current Outpatient Prescriptions on File Prior to Visit  Medication Sig Dispense Refill  . amitriptyline (ELAVIL) 50 MG tablet Take 50 mg  by mouth at bedtime.        Marland Kitchen aspirin EC 325 MG tablet Take 325 mg by mouth daily.      . Calcium Carbonate-Vit D-Min (CALTRATE 600+D PLUS) 600-400 MG-UNIT per tablet Chew 1 tablet by mouth 2 (two) times daily.        . Cholecalciferol (VITAMIN D) 2000 UNITS tablet Take 2,000 Units by mouth daily.       . Coenzyme Q10 (CO Q-10) 200 MG CAPS Take 2 capsules by mouth daily.        . hydroxychloroquine (PLAQUENIL) 200 MG tablet Take 200 mg by mouth 2 (two) times daily.        . lansoprazole (PREVACID) 30 MG capsule TAKE ONE CAPSULE DAILY.  90 capsule  3  . magnesium oxide (MAG-OX) 400 MG tablet Take 400 mg by mouth daily.        . meloxicam (MOBIC) 15 MG tablet Take 15 mg by mouth daily.        .  methylPREDNISolone (MEDROL) 4 MG tablet Take 8 mg by mouth daily.       . Nebivolol HCl (BYSTOLIC) 20 MG TABS Take 1 tablet by mouth daily.        Bertram Gala Glycol-Propyl Glycol (SYSTANE) 0.4-0.3 % GEL Apply 1 application to eye daily as needed. For dry eye relief      . pravastatin (PRAVACHOL) 40 MG tablet Take 60 mg by mouth at bedtime.       . sucralfate (CARAFATE) 1 G tablet TAKE (1) TABLET BY MOUTH (4) TIMES DAILY.  120 tablet  3  . traMADol (ULTRAM) 50 MG tablet Take 50 mg by mouth every 6 (six) hours as needed. FOR PAIN        No current facility-administered medications on file prior to visit.    Allergies:   Allergies  Allergen Reactions  . Aurothioglucose (Solganal) Other (See Comments)    REACTION: VASCULITIS   . Calcium Channel Blockers Palpitations  . Feldene (Piroxicam) Anaphylaxis and Other (See Comments)    SKIN BLISTERS  . Hydralazine Other (See Comments)    REACTION: Chest pains and headaches: IV only  . Imdur (Isosorbide Mononitrate) Other (See Comments)    REACTION: PATIENT IS UNABLE TO WALK OR FUNCTION  . Methotrexate Derivatives Other (See Comments)    REACTION: VASCULITIS  . Thimerosal Other (See Comments)    SKIN BLISTERED  . Percocet (Oxycodone-Acetaminophen) Other (See Comments)    REACTION : INSOMNIA  . Vioxx (Rofecoxib) Other (See Comments)    REACTION: CAUSED MORE JOINT PAIN  . Arava (Leflunomide)     REACTION: UNKNOWN  . Asacol (Mesalamine) Other (See Comments)    REACTION: UNKNOWN  . Iohexol      Desc: PT STATES SHE WAS TOLD NEVER TO HAVE CT CONTRAST AGAIN.  SHE HAD SOME KIND OF REACTION BUT DOESNT REMEBER WHAT HAPPENED   . Lidocaine Other (See Comments)    REACTION: ONLY WITH THE PRESERVATIVES IN THE MEDICATION  . Sulfa Antibiotics     UNKNOWN  . Latex Rash  . Nickel Rash  . Nsaids Other (See Comments)    GI UPSET , CAN TOLERATE SOME NSAIDS   Filed Vitals:   08/23/12 1559  BP: 139/59  Pulse: 60     Physical Exam General: well  developed, well nourished, seated, in no evident distress Head: head normocephalic and atraumatic. Orohparynx benign Neck: supple with soft right  carotid   bruit Cardiovascular: regular rate and rhythm, no murmurs Musculoskeletal:  no deformity Skin:  no rash/petichiae Vascular:  Normal pulses all extremities  Neurologic Exam Mental Status: Awake and fully alert. Oriented to place and time. Recent and remote memory intact.Diminished recall 2/3. Animal Naming 9 only. Attention span, concentration and fund of knowledge appropriate. . Clock drawing 4/4. appropriate.  Cranial Nerves: Fundoscopic exam reveals sharp disc margins. Pupils equal, briskly reactive to light. Extraocular movements full without nystagmus. Visual fields full to confrontation. Hearing intact. Facial sensation intact. Face, tongue, palate moves normally and symmetrically.  Motor: Normal bulk and tone. Normal strength in all tested extremity muscles. Sensory.: intact to tough and pinprick and vibratory.  Coordination: Rapid alternating movements normal in all extremities. Finger-to-nose and heel-to-shin performed accurately bilaterally. Gait and Station: Arises from chair without difficulty. Stance is normal. Gait demonstrates normal stride length and balance . Able to heel, toe and tandem walk without difficulty.  Reflexes: 1+ and symmetric. Toes downgoing.     ASSESSMENT: 62 year old lady with left hemispheric TIA in January 2012 from left internal carotid artery occlusion. Asymptomatic right posterior commuting artery 7.5 x 4.5 mm aneurysm status post coiling on 05/11/11 by Dr Lanna Poche. Episode of memory loss few months ago suggestive of transient global amnesia though TIA and seizures are also a possibility.    PLAN: Continue aspirin for stroke prevention with strict control of lipids and LDL cholesterol goal below 100 mg percent and hypertension with blood pressure goal below 1:30/90. I have advised her to see her primary  physician Dr. Pearson Grippe for her recent hypotensive episodes to adjust her medications. Check EEG and MRI scan to evaluate recent episode of memory loss. Check followup carotid ultrasound study for her carotid occlusion. Return for followup in 6 months.

## 2012-08-30 ENCOUNTER — Ambulatory Visit (INDEPENDENT_AMBULATORY_CARE_PROVIDER_SITE_OTHER): Payer: 59

## 2012-08-30 DIAGNOSIS — G459 Transient cerebral ischemic attack, unspecified: Secondary | ICD-10-CM

## 2012-11-02 ENCOUNTER — Other Ambulatory Visit: Payer: Self-pay | Admitting: Internal Medicine

## 2012-11-02 DIAGNOSIS — M25561 Pain in right knee: Secondary | ICD-10-CM

## 2012-11-11 ENCOUNTER — Ambulatory Visit
Admission: RE | Admit: 2012-11-11 | Discharge: 2012-11-11 | Disposition: A | Discharge status: Home / Self Care | Payer: 59 | Source: Ambulatory Visit | Attending: Internal Medicine | Admitting: Internal Medicine

## 2012-11-11 DIAGNOSIS — M25561 Pain in right knee: Secondary | ICD-10-CM

## 2012-12-26 ENCOUNTER — Other Ambulatory Visit (INDEPENDENT_AMBULATORY_CARE_PROVIDER_SITE_OTHER): Payer: 59

## 2012-12-26 DIAGNOSIS — G454 Transient global amnesia: Secondary | ICD-10-CM

## 2012-12-26 DIAGNOSIS — R413 Other amnesia: Secondary | ICD-10-CM

## 2013-02-04 ENCOUNTER — Encounter (INDEPENDENT_AMBULATORY_CARE_PROVIDER_SITE_OTHER): Payer: Self-pay | Admitting: *Deleted

## 2013-02-13 ENCOUNTER — Other Ambulatory Visit: Payer: Self-pay | Admitting: Physician Assistant

## 2013-02-13 ENCOUNTER — Encounter: Payer: Self-pay | Admitting: Physician Assistant

## 2013-02-13 DIAGNOSIS — M059 Rheumatoid arthritis with rheumatoid factor, unspecified: Secondary | ICD-10-CM | POA: Insufficient documentation

## 2013-02-13 DIAGNOSIS — M1990 Unspecified osteoarthritis, unspecified site: Secondary | ICD-10-CM

## 2013-02-13 DIAGNOSIS — E7800 Pure hypercholesterolemia, unspecified: Secondary | ICD-10-CM | POA: Insufficient documentation

## 2013-02-13 DIAGNOSIS — R112 Nausea with vomiting, unspecified: Secondary | ICD-10-CM

## 2013-02-13 DIAGNOSIS — M1711 Unilateral primary osteoarthritis, right knee: Secondary | ICD-10-CM | POA: Insufficient documentation

## 2013-02-13 DIAGNOSIS — C801 Malignant (primary) neoplasm, unspecified: Secondary | ICD-10-CM

## 2013-02-13 DIAGNOSIS — C799 Secondary malignant neoplasm of unspecified site: Secondary | ICD-10-CM | POA: Insufficient documentation

## 2013-02-13 DIAGNOSIS — K635 Polyp of colon: Secondary | ICD-10-CM | POA: Insufficient documentation

## 2013-02-13 DIAGNOSIS — I639 Cerebral infarction, unspecified: Secondary | ICD-10-CM | POA: Insufficient documentation

## 2013-02-13 NOTE — H&P (Addendum)
TOTAL KNEE ADMISSION H&P  Patient is being admitted for right total knee arthroplasty.  Subjective:  Chief Complaint:right knee pain.  HPI: [name], 62 y.o. female, has a history of pain and functional disability in the right knee due to arthritis and has failed non-surgical conservative treatments for greater than 12 weeks to includeNSAID's and/or analgesics, corticosteriod injections, viscosupplementation injections, flexibility and strengthening excercises, supervised PT with diminished ADL's post treatment, use of assistive devices and activity modification.  Onset of symptoms was gradual, starting 10 years ago with gradually worsening course since that time. The patient noted no past surgery on the right knee(s).  Patient currently rates pain in the right knee(s) at 10 out of 10 with activity. Patient has night pain, worsening of pain with activity and weight bearing, pain that interferes with activities of daily living, crepitus and joint swelling.  Patient has evidence of subchondral sclerosis, joint subluxation and joint space narrowing by imaging studies.There is no active infection.  Patient Active Problem List   Diagnosis Date Noted  . PONV (postoperative nausea and vomiting)   . Stroke   . Hypercholesterolemia   . Cancer   . Colon polyps   . Arthritis   . Rheumatoid arthritis with rheumatoid factor   . Right knee DJD   . Asthenia 08/14/2012  . Cerebral aneurysm, nonruptured 08/14/2012  . TIA (transient ischemic attack) 08/14/2012  . RA (rheumatoid arthritis) 10/27/2011  . High cholesterol 10/27/2011  . Hypertension 10/27/2011  . GERD (gastroesophageal reflux disease) 10/27/2011   Past Medical History  Diagnosis Date  . PONV (postoperative nausea and vomiting)   . Stroke     jan 2012  . Hypertension   . Angina   . Hypercholesterolemia   . Numbness and tingling in right hand   . GERD (gastroesophageal reflux disease)   . Constipation   . Diarrhea   . Colon  polyps   . Arthritis     Rheumatoid  . Cancer     left breast, cervical cancer  . Rheumatoid arthritis with rheumatoid factor   . Right knee DJD     Past Surgical History  Procedure Laterality Date  . Right brain aneurysm  04/2010    Coiled   . Abdominal hysterectomy  1977  . Cholecystectomy    . Bilateral oophorectomy    . Left knee arthroscopy    . Right wrist surgery  1999    synevectomy  . Left breast lumpectomy  2004  . Appendectomy  1964  . Colonoscopy  12/09/2010    Procedure: COLONOSCOPY;  Surgeon: Najeeb U Rehman, MD;  Location: [facility];  Service: Endoscopy;  Laterality: N/A;     (Not in a hospital admission) Allergies  Allergen Reactions  . Aurothioglucose [Solganal] Other (See Comments)    REACTION: VASCULITIS   . Calcium Channel Blockers Palpitations  . Feldene [Piroxicam] Anaphylaxis and Other (See Comments)    SKIN BLISTERS  . Hydralazine Other (See Comments)    REACTION: Chest pains and headaches: IV only  . Imdur [Isosorbide Mononitrate] Other (See Comments)    REACTION: PATIENT IS UNABLE TO WALK OR FUNCTION  . Methotrexate Derivatives Other (See Comments)    REACTION: VASCULITIS  . Nickel Rash  . Thimerosal Other (See Comments)    SKIN BLISTERED  . Percocet [Oxycodone-Acetaminophen] Other (See Comments)    REACTION : INSOMNIA  . Vioxx [Rofecoxib] Other (See Comments)    REACTION: CAUSED MORE JOINT PAIN  . Arava [Leflunomide]       REACTION: UNKNOWN  . Asacol [Mesalamine] Other (See Comments)    REACTION: UNKNOWN  . Iohexol      Desc: PT STATES SHE WAS TOLD NEVER TO HAVE CT CONTRAST AGAIN.  SHE HAD SOME KIND OF REACTION BUT DOESNT REMEBER WHAT HAPPENED   . Lidocaine Other (See Comments)    REACTION: ONLY WITH THE PRESERVATIVES IN THE MEDICATION  . Sulfa Antibiotics     UNKNOWN  . Latex Rash  . Nsaids Other (See Comments)    GI UPSET , CAN TOLERATE SOME NSAIDS     Current Outpatient Prescriptions on File Prior to Visit  Medication Sig  Dispense Refill  . amitriptyline (ELAVIL) 50 MG tablet Take 50 mg by mouth at bedtime.        . aspirin EC 325 MG tablet Take 325 mg by mouth daily.      . BENICAR 40 MG tablet Take 40 mg by mouth daily.       . Calcium Carbonate-Vit D-Min (CALTRATE 600+D PLUS) 600-400 MG-UNIT per tablet Chew 1 tablet by mouth 2 (two) times daily.        . Cholecalciferol (VITAMIN D) 2000 UNITS tablet Take 2,000 Units by mouth daily.       . Coenzyme Q10 (CO Q-10) 200 MG CAPS Take 2 capsules by mouth daily.        . furosemide (LASIX) 40 MG tablet Take 40 mg by mouth daily.       . hydroxychloroquine (PLAQUENIL) 200 MG tablet Take 200 mg by mouth 2 (two) times daily.        . lansoprazole (PREVACID) 30 MG capsule Take 30 mg by mouth daily at 12 noon.      . magnesium oxide (MAG-OX) 400 MG tablet Take 400 mg by mouth daily.        . meloxicam (MOBIC) 15 MG tablet Take 15 mg by mouth daily.        . methylPREDNISolone (MEDROL) 4 MG tablet Take 4 mg by mouth daily.       . Nebivolol HCl (BYSTOLIC) 20 MG TABS Take 1 tablet by mouth daily.        . pravastatin (PRAVACHOL) 40 MG tablet Take 80 mg by mouth at bedtime.       . sucralfate (CARAFATE) 1 G tablet Take 1 g by mouth daily.      . traMADol (ULTRAM) 50 MG tablet Take 50 mg by mouth every 6 (six) hours as needed. FOR PAIN       . XELJANZ 5 MG TABS Take 5 mg by mouth 2 (two) times daily.       . Polyethyl Glycol-Propyl Glycol (SYSTANE) 0.4-0.3 % GEL Apply 1 application to eye daily as needed. For dry eye relief       No current facility-administered medications on file prior to visit.   History  Substance Use Topics  . Smoking status: Former Smoker -- 2.00 packs/day for 15 years  . Smokeless tobacco: Not on file     Comment: Quit 1986  . Alcohol Use: No    Family History  Problem Relation Age of Onset  . Colon cancer Neg Hx   . Hypertension Father   . Heart attack Father      Review of Systems  Constitutional: Negative.   HENT: Negative.         History dental abcess Sept 2014.  Took antibiotics for it.  Got better but still has occasional flare.  She will see her dentist this   coming week to confirm no abcess  Eyes: Negative.   Cardiovascular: Negative.   Gastrointestinal: Negative.   Genitourinary: Negative.   Musculoskeletal: Positive for joint pain.  Skin: Negative.   Neurological: Negative.   Endo/Heme/Allergies: Negative.   Psychiatric/Behavioral: Negative.     Objective:  Physical Exam  Constitutional: She is oriented to person, place, and time. She appears well-developed and well-nourished.  HENT:  Head: Normocephalic and atraumatic.  Eyes: Conjunctivae are normal. Pupils are equal, round, and reactive to light.  Neck: Normal range of motion. Neck supple.  Cardiovascular: Normal rate, regular rhythm and intact distal pulses.  Exam reveals no gallop and no friction rub.   Murmur heard. Respiratory: Effort normal and breath sounds normal. No respiratory distress. She has no wheezes. She has no rales. She exhibits no tenderness.  GI: Soft. Bowel sounds are normal. She exhibits no distension and no mass. There is no tenderness. There is no rebound and no guarding.  Genitourinary:  Not pertinent to current symptomatology therefore not examined.  Musculoskeletal:  Examination of her right knee reveals mild valgus deformity.  Pain laterally.  1+ synovitis.  1+ crepitation.  Range of motion from -5 to 125 degrees.  Knee is stable with normal patella tracking and diffuse pain.  Examination of the left knee reveals full range of motion without pain, swelling, weakness or instability.  Vascular exam: Pulses are 2+ and symmetric.    Neurological: She is alert and oriented to person, place, and time.  Skin: Skin is warm and dry.  Psychiatric: She has a normal mood and affect. Her behavior is normal.    Vital signs in last 24 hours: Last recorded: 12/31 0900   BP: 160/75 Pulse: 66  Temp: 98.2 F (36.8 C)    Height: 5' 7"  (1.702 m) SpO2: 99  Weight: 97.977 kg (216 lb)     Labs:   Estimated body mass index is 33.82 kg/(m^2) as calculated from the following:   Height as of this encounter: 5' 7" (1.702 m).   Weight as of this encounter: 97.977 kg (216 lb).   Imaging Review Plain radiographs demonstrate severe degenerative joint disease of the right knee(s). The overall alignment ismild valgus. The bone quality appears to be good for age and reported activity level.  Assessment/Plan:  End stage arthritis, right knee  Patient Active Problem List   Diagnosis Date Noted  . PONV (postoperative nausea and vomiting)   . Stroke   . Hypercholesterolemia   . Cancer   . Colon polyps   . Arthritis   . Rheumatoid arthritis with rheumatoid factor   . Right knee DJD   . Asthenia 08/14/2012  . Cerebral aneurysm, nonruptured 08/14/2012  . TIA (transient ischemic attack) 08/14/2012  . RA (rheumatoid arthritis) 10/27/2011  . High cholesterol 10/27/2011  . Hypertension 10/27/2011  . GERD (gastroesophageal reflux disease) 10/27/2011   The patient history, physical examination, clinical judgment of the provider and imaging studies are consistent with end stage degenerative joint disease of the right knee(s) and total knee arthroplasty is deemed medically necessary. The treatment options including medical management, injection therapy arthroscopy and arthroplasty were discussed at length. The risks and benefits of total knee arthroplasty were presented and reviewed. The risks due to aseptic loosening, infection, stiffness, patella tracking problems, thromboembolic complications and other imponderables were discussed. The patient acknowledged the explanation, agreed to proceed with the plan and consent was signed. Patient is being admitted for inpatient treatment for surgery, pain control, PT, OT, prophylactic   antibiotics, VTE prophylaxis, progressive ambulation and ADL's and discharge planning. The patient is planning to  be discharged home with home health services  Nyasiah Moffet A. Sidharth Leverette, PA-C Physician Assistant [facility] [phone]  02/13/2013, 11:13 AM   

## 2013-02-19 ENCOUNTER — Encounter (HOSPITAL_COMMUNITY): Payer: Self-pay

## 2013-02-19 ENCOUNTER — Encounter (HOSPITAL_COMMUNITY)
Admission: RE | Admit: 2013-02-19 | Discharge: 2013-02-19 | Disposition: A | Discharge status: Home / Self Care | Payer: 59 | Source: Ambulatory Visit | Attending: Orthopedic Surgery | Admitting: Orthopedic Surgery

## 2013-02-19 DIAGNOSIS — Z01818 Encounter for other preprocedural examination: Secondary | ICD-10-CM | POA: Insufficient documentation

## 2013-02-19 DIAGNOSIS — Z01812 Encounter for preprocedural laboratory examination: Secondary | ICD-10-CM | POA: Insufficient documentation

## 2013-02-19 HISTORY — DX: Peripheral vascular disease, unspecified: I73.9

## 2013-02-19 HISTORY — DX: Anemia, unspecified: D64.9

## 2013-02-19 HISTORY — DX: Cardiac murmur, unspecified: R01.1

## 2013-02-19 LAB — BASIC METABOLIC PANEL
BUN: 11 mg/dL (ref 6–23)
CO2: 28 meq/L (ref 19–32)
CREATININE: 0.87 mg/dL (ref 0.50–1.10)
Calcium: 10 mg/dL (ref 8.4–10.5)
Chloride: 105 mEq/L (ref 96–112)
GFR calc Af Amer: 80 mL/min — ABNORMAL LOW (ref >90)
GFR calc non Af Amer: 69 mL/min — ABNORMAL LOW (ref >90)
GLUCOSE: 123 mg/dL — AB (ref 70–99)
Potassium: 4.5 mEq/L (ref 3.7–5.3)
Sodium: 145 mEq/L (ref 137–147)

## 2013-02-19 LAB — APTT: APTT: 26 s (ref 24–37)

## 2013-02-19 LAB — CBC
HEMATOCRIT: 34.2 % — AB (ref 36.0–46.0)
Hemoglobin: 10.1 g/dL — ABNORMAL LOW (ref 12.0–15.0)
MCH: 24.4 pg — ABNORMAL LOW (ref 26.0–34.0)
MCHC: 29.5 g/dL — AB (ref 30.0–36.0)
MCV: 82.6 fL (ref 78.0–100.0)
Platelets: 293 10*3/uL (ref 150–400)
RBC: 4.14 MIL/uL (ref 3.87–5.11)
RDW: 17.1 % — AB (ref 11.5–15.5)
WBC: 12.6 10*3/uL — ABNORMAL HIGH (ref 4.0–10.5)

## 2013-02-19 LAB — PROTIME-INR
INR: 0.97 (ref 0.00–1.49)
PROTHROMBIN TIME: 12.7 s (ref 11.6–15.2)

## 2013-02-19 LAB — SURGICAL PCR SCREEN
MRSA, PCR: NEGATIVE
Staphylococcus aureus: POSITIVE — AB

## 2013-02-19 LAB — ABO/RH: ABO/RH(D): B NEG

## 2013-02-19 NOTE — Pre-Procedure Instructions (Addendum)
KRYSTEN VERONICA  02/19/2013   Your procedure is scheduled on:  Monday, January 12.  Report to [facility], Main Entrance [facility]" at 5:30 AM.  Call this number if you have problems the morning of surgery: [phone]   Remember:   Do not eat food or drink liquids after midnight.   Take these medicines the morning of surgery with A SIP OF WATER: lansoprazole (PREVACID),methylPREDNISolone (MEDROL), Nebivolol HCl (BYSTOLIC), plaquenil .  Take if needed:traMADol Veatrice Bourbon).   Stop taking Aspirin, Coumadin, Plavix, Effient and herbal medications.  Do not take any NSAIDS ie:  Ibuprofen, Advil, Naproxen. Including   vitamins,    meloxicam (MOBIC) coq10 ,xeljanz  Per dr  and Herbal medications.    Do not wear jewelry, make-up or nail polish.  Do not wear lotions, powders, or perfumes. You may wear deodorant.  Do not shave 48 hours prior to surgery. Men may shave face and neck.  Do not bring valuables to the hospital.  [facility] is not responsible for any belongings or valuables.               Contacts, dentures or bridgework may not be worn into surgery.  Leave suitcase in the car. After surgery it may be brought to your room.  For patients admitted to the hospital, discharge time is determined by your treatment team.               Special Instructions: Shower using CHG 2 nights before surgery and the night before surgery.  If you shower the day of surgery use CHG.  Use special wash - you have one bottle of CHG for all showers.  You should use approximately 1/3 of the bottle for each shower.   Please read over the following fact sheets that you were given: Pain Booklet, Coughing and Deep Breathing, Blood Transfusion Information and Surgical Site Infection Prevention

## 2013-02-19 NOTE — Progress Notes (Signed)
req'd ekg, notes, any tests from [facility] card. Dr Woody Seller cxr from [facility] medical dr Maudie Mercury

## 2013-02-25 ENCOUNTER — Ambulatory Visit: Payer: 59 | Admitting: Nurse Practitioner

## 2013-02-25 ENCOUNTER — Inpatient Hospital Stay (HOSPITAL_COMMUNITY): Payer: 59 | Admitting: Anesthesiology

## 2013-02-25 ENCOUNTER — Inpatient Hospital Stay (HOSPITAL_COMMUNITY)
Admission: RE | Admit: 2013-02-25 | Discharge: 2013-02-27 | DRG: 470 | Disposition: A | Discharge status: Home / Self Care | Payer: 59 | Source: Ambulatory Visit | Attending: Orthopedic Surgery | Admitting: Orthopedic Surgery

## 2013-02-25 ENCOUNTER — Encounter (HOSPITAL_COMMUNITY)
Admission: RE | Disposition: A | Discharge status: Home / Self Care | Payer: Self-pay | Source: Ambulatory Visit | Attending: Orthopedic Surgery

## 2013-02-25 ENCOUNTER — Encounter (HOSPITAL_COMMUNITY): Payer: Self-pay | Admitting: Anesthesiology

## 2013-02-25 ENCOUNTER — Encounter (HOSPITAL_COMMUNITY): Payer: 59 | Admitting: Vascular Surgery

## 2013-02-25 DIAGNOSIS — C799 Secondary malignant neoplasm of unspecified site: Secondary | ICD-10-CM | POA: Diagnosis present

## 2013-02-25 DIAGNOSIS — Z853 Personal history of malignant neoplasm of breast: Secondary | ICD-10-CM

## 2013-02-25 DIAGNOSIS — M059 Rheumatoid arthritis with rheumatoid factor, unspecified: Secondary | ICD-10-CM | POA: Diagnosis present

## 2013-02-25 DIAGNOSIS — Z9889 Other specified postprocedural states: Secondary | ICD-10-CM

## 2013-02-25 DIAGNOSIS — D638 Anemia in other chronic diseases classified elsewhere: Secondary | ICD-10-CM | POA: Diagnosis present

## 2013-02-25 DIAGNOSIS — M069 Rheumatoid arthritis, unspecified: Secondary | ICD-10-CM | POA: Diagnosis present

## 2013-02-25 DIAGNOSIS — M179 Osteoarthritis of knee, unspecified: Secondary | ICD-10-CM | POA: Diagnosis present

## 2013-02-25 DIAGNOSIS — Z8673 Personal history of transient ischemic attack (TIA), and cerebral infarction without residual deficits: Secondary | ICD-10-CM

## 2013-02-25 DIAGNOSIS — M1710 Unilateral primary osteoarthritis, unspecified knee: Principal | ICD-10-CM | POA: Diagnosis present

## 2013-02-25 DIAGNOSIS — I10 Essential (primary) hypertension: Secondary | ICD-10-CM | POA: Diagnosis present

## 2013-02-25 DIAGNOSIS — I639 Cerebral infarction, unspecified: Secondary | ICD-10-CM | POA: Diagnosis present

## 2013-02-25 DIAGNOSIS — D649 Anemia, unspecified: Secondary | ICD-10-CM | POA: Diagnosis present

## 2013-02-25 DIAGNOSIS — Z8249 Family history of ischemic heart disease and other diseases of the circulatory system: Secondary | ICD-10-CM

## 2013-02-25 DIAGNOSIS — I739 Peripheral vascular disease, unspecified: Secondary | ICD-10-CM | POA: Diagnosis present

## 2013-02-25 DIAGNOSIS — R112 Nausea with vomiting, unspecified: Secondary | ICD-10-CM | POA: Diagnosis present

## 2013-02-25 DIAGNOSIS — Z7982 Long term (current) use of aspirin: Secondary | ICD-10-CM

## 2013-02-25 DIAGNOSIS — R531 Weakness: Secondary | ICD-10-CM | POA: Diagnosis present

## 2013-02-25 DIAGNOSIS — M1990 Unspecified osteoarthritis, unspecified site: Secondary | ICD-10-CM | POA: Diagnosis present

## 2013-02-25 DIAGNOSIS — K219 Gastro-esophageal reflux disease without esophagitis: Secondary | ICD-10-CM | POA: Diagnosis present

## 2013-02-25 DIAGNOSIS — Z87891 Personal history of nicotine dependence: Secondary | ICD-10-CM

## 2013-02-25 DIAGNOSIS — Z79899 Other long term (current) drug therapy: Secondary | ICD-10-CM

## 2013-02-25 DIAGNOSIS — G459 Transient cerebral ischemic attack, unspecified: Secondary | ICD-10-CM | POA: Diagnosis present

## 2013-02-25 DIAGNOSIS — M1711 Unilateral primary osteoarthritis, right knee: Secondary | ICD-10-CM | POA: Diagnosis present

## 2013-02-25 DIAGNOSIS — E7800 Pure hypercholesterolemia, unspecified: Secondary | ICD-10-CM | POA: Diagnosis present

## 2013-02-25 DIAGNOSIS — I671 Cerebral aneurysm, nonruptured: Secondary | ICD-10-CM | POA: Diagnosis present

## 2013-02-25 DIAGNOSIS — Z8541 Personal history of malignant neoplasm of cervix uteri: Secondary | ICD-10-CM

## 2013-02-25 DIAGNOSIS — D62 Acute posthemorrhagic anemia: Secondary | ICD-10-CM | POA: Diagnosis not present

## 2013-02-25 DIAGNOSIS — K635 Polyp of colon: Secondary | ICD-10-CM | POA: Diagnosis present

## 2013-02-25 HISTORY — PX: TOTAL KNEE ARTHROPLASTY: SHX125

## 2013-02-25 LAB — URINALYSIS W MICROSCOPIC + REFLEX CULTURE
BILIRUBIN URINE: NEGATIVE
GLUCOSE, UA: NEGATIVE mg/dL
Hgb urine dipstick: NEGATIVE
KETONES UR: 15 mg/dL — AB
Nitrite: NEGATIVE
PH: 7 (ref 5.0–8.0)
PROTEIN: NEGATIVE mg/dL
Specific Gravity, Urine: 1.016 (ref 1.005–1.030)
Urobilinogen, UA: 0.2 mg/dL (ref 0.0–1.0)

## 2013-02-25 SURGERY — ARTHROPLASTY, KNEE, TOTAL
Anesthesia: General | Site: Knee | Laterality: Right

## 2013-02-25 MED ORDER — IRBESARTAN 300 MG PO TABS
300.0000 mg | ORAL_TABLET | Freq: Every day | ORAL | Status: DC
  Filled 2013-02-25: qty 1

## 2013-02-25 MED ORDER — VITAMIN D3 25 MCG (1000 UNIT) PO TABS
2000.0000 [IU] | ORAL_TABLET | Freq: Every day | ORAL | Status: DC
  Administered 2013-02-25 – 2013-02-26 (×2): 2000 [IU] via ORAL
  Filled 2013-02-25 (×3): qty 2

## 2013-02-25 MED ORDER — PHENOL 1.4 % MT LIQD: 1.0000 | OROMUCOSAL | Status: DC | PRN

## 2013-02-25 MED ORDER — BUPIVACAINE-EPINEPHRINE 0.25% -1:200000 IJ SOLN
INTRAMUSCULAR | Status: DC | PRN
  Administered 2013-02-25: 30 mL

## 2013-02-25 MED ORDER — NEBIVOLOL HCL 20 MG PO TABS: 1.0000 | ORAL_TABLET | Freq: Every day | ORAL | Status: DC

## 2013-02-25 MED ORDER — HYDROMORPHONE HCL PF 1 MG/ML IJ SOLN
1.0000 mg | INTRAMUSCULAR | Status: DC | PRN
  Administered 2013-02-25 – 2013-02-27 (×7): 1 mg via INTRAVENOUS
  Filled 2013-02-25 (×7): qty 1

## 2013-02-25 MED ORDER — MIDAZOLAM HCL 5 MG/5ML IJ SOLN
INTRAMUSCULAR | Status: DC | PRN
  Administered 2013-02-25: 2 mg via INTRAVENOUS

## 2013-02-25 MED ORDER — DEXAMETHASONE 6 MG PO TABS
10.0000 mg | ORAL_TABLET | Freq: Three times a day (TID) | ORAL | Status: AC
  Administered 2013-02-25 – 2013-02-26 (×2): 10 mg via ORAL
  Filled 2013-02-25 (×3): qty 1

## 2013-02-25 MED ORDER — SIMVASTATIN 20 MG PO TABS
20.0000 mg | ORAL_TABLET | Freq: Every day | ORAL | Status: DC
  Administered 2013-02-25 – 2013-02-26 (×2): 20 mg via ORAL
  Filled 2013-02-25 (×3): qty 1

## 2013-02-25 MED ORDER — FENTANYL CITRATE 0.05 MG/ML IJ SOLN
INTRAMUSCULAR | Status: AC
  Administered 2013-02-25: 10:00:00
  Filled 2013-02-25: qty 2

## 2013-02-25 MED ORDER — CALTRATE 600+D PLUS 600-400 MG-UNIT PO CHEW: 1.0000 | CHEWABLE_TABLET | Freq: Two times a day (BID) | ORAL | Status: DC

## 2013-02-25 MED ORDER — MAGNESIUM OXIDE 400 MG PO TABS: 400.0000 mg | ORAL_TABLET | Freq: Every day | ORAL | Status: DC

## 2013-02-25 MED ORDER — LABETALOL HCL 5 MG/ML IV SOLN
5.0000 mg | Freq: Once | INTRAVENOUS | Status: AC
  Administered 2013-02-25: 5 mg via INTRAVENOUS

## 2013-02-25 MED ORDER — DIPHENHYDRAMINE HCL 12.5 MG/5ML PO ELIX
12.5000 mg | ORAL_SOLUTION | ORAL | Status: DC | PRN
  Administered 2013-02-25: 25 mg via ORAL
  Filled 2013-02-25: qty 10

## 2013-02-25 MED ORDER — METOCLOPRAMIDE HCL 5 MG/ML IJ SOLN: 5.0000 mg | Freq: Three times a day (TID) | INTRAMUSCULAR | Status: DC | PRN

## 2013-02-25 MED ORDER — NEOSTIGMINE METHYLSULFATE 1 MG/ML IJ SOLN
INTRAMUSCULAR | Status: DC | PRN
  Administered 2013-02-25: 3 mg via INTRAVENOUS

## 2013-02-25 MED ORDER — FUROSEMIDE 40 MG PO TABS
40.0000 mg | ORAL_TABLET | Freq: Every day | ORAL | Status: DC
  Filled 2013-02-25: qty 1

## 2013-02-25 MED ORDER — RIVAROXABAN 10 MG PO TABS
10.0000 mg | ORAL_TABLET | Freq: Every day | ORAL | Status: DC
  Administered 2013-02-26 – 2013-02-27 (×2): 10 mg via ORAL
  Filled 2013-02-25 (×4): qty 1

## 2013-02-25 MED ORDER — BISACODYL 5 MG PO TBEC
10.0000 mg | DELAYED_RELEASE_TABLET | Freq: Every day | ORAL | Status: DC
  Administered 2013-02-26: 10 mg via ORAL
  Filled 2013-02-25 (×2): qty 2

## 2013-02-25 MED ORDER — POLYVINYL ALCOHOL 1.4 % OP SOLN: 1.0000 [drp] | Freq: Every day | OPHTHALMIC | Status: DC | PRN

## 2013-02-25 MED ORDER — ALUM & MAG HYDROXIDE-SIMETH 200-200-20 MG/5ML PO SUSP
30.0000 mL | ORAL | Status: DC | PRN
  Administered 2013-02-26 (×2): 30 mL via ORAL
  Filled 2013-02-25 (×2): qty 30

## 2013-02-25 MED ORDER — AMITRIPTYLINE HCL 50 MG PO TABS
50.0000 mg | ORAL_TABLET | Freq: Every day | ORAL | Status: DC
  Administered 2013-02-25 – 2013-02-26 (×2): 50 mg via ORAL
  Filled 2013-02-25 (×3): qty 1

## 2013-02-25 MED ORDER — DEXAMETHASONE SODIUM PHOSPHATE 10 MG/ML IJ SOLN
10.0000 mg | Freq: Three times a day (TID) | INTRAMUSCULAR | Status: AC
  Administered 2013-02-25: 10 mg via INTRAVENOUS
  Filled 2013-02-25 (×3): qty 1

## 2013-02-25 MED ORDER — FENTANYL CITRATE 0.05 MG/ML IJ SOLN
25.0000 ug | INTRAMUSCULAR | Status: DC | PRN
  Administered 2013-02-25: 25 ug via INTRAVENOUS
  Administered 2013-02-25 (×2): 50 ug via INTRAVENOUS

## 2013-02-25 MED ORDER — ACETAMINOPHEN 10 MG/ML IV SOLN
1000.0000 mg | Freq: Once | INTRAVENOUS | Status: AC
  Administered 2013-02-25: 1000 mg via INTRAVENOUS

## 2013-02-25 MED ORDER — LACTATED RINGERS IV SOLN
INTRAVENOUS | Status: DC | PRN
  Administered 2013-02-25: 07:00:00 via INTRAVENOUS

## 2013-02-25 MED ORDER — CEFAZOLIN SODIUM-DEXTROSE 2-3 GM-% IV SOLR
2.0000 g | Freq: Four times a day (QID) | INTRAVENOUS | Status: AC
  Administered 2013-02-25 (×2): 2 g via INTRAVENOUS
  Filled 2013-02-25 (×2): qty 50

## 2013-02-25 MED ORDER — POLYETHYL GLYCOL-PROPYL GLYCOL 0.4-0.3 % OP GEL: 1.0000 "application " | Freq: Every day | OPHTHALMIC | Status: DC | PRN

## 2013-02-25 MED ORDER — CEFAZOLIN SODIUM-DEXTROSE 2-3 GM-% IV SOLR
2.0000 g | INTRAVENOUS | Status: AC
  Administered 2013-02-25: 2 g via INTRAVENOUS

## 2013-02-25 MED ORDER — MENTHOL 3 MG MT LOZG: 1.0000 | LOZENGE | OROMUCOSAL | Status: DC | PRN

## 2013-02-25 MED ORDER — ONDANSETRON HCL 4 MG/2ML IJ SOLN: 4.0000 mg | Freq: Four times a day (QID) | INTRAMUSCULAR | Status: DC | PRN

## 2013-02-25 MED ORDER — PROPOFOL 10 MG/ML IV BOLUS
INTRAVENOUS | Status: DC | PRN
  Administered 2013-02-25: 140 mg via INTRAVENOUS

## 2013-02-25 MED ORDER — ACETAMINOPHEN 650 MG RE SUPP: 650.0000 mg | Freq: Four times a day (QID) | RECTAL | Status: DC | PRN

## 2013-02-25 MED ORDER — METHYLPREDNISOLONE 4 MG PO TABS
4.0000 mg | ORAL_TABLET | Freq: Every day | ORAL | Status: DC
  Administered 2013-02-26: 4 mg via ORAL
  Filled 2013-02-25 (×2): qty 1

## 2013-02-25 MED ORDER — ONDANSETRON HCL 4 MG PO TABS
4.0000 mg | ORAL_TABLET | Freq: Four times a day (QID) | ORAL | Status: DC | PRN
Start: 2013-02-25 — End: 2013-02-27

## 2013-02-25 MED ORDER — BUPIVACAINE-EPINEPHRINE (PF) 0.25% -1:200000 IJ SOLN
INTRAMUSCULAR | Status: AC
  Filled 2013-02-25: qty 30

## 2013-02-25 MED ORDER — ZOLPIDEM TARTRATE 5 MG PO TABS
5.0000 mg | ORAL_TABLET | Freq: Every evening | ORAL | Status: DC | PRN
Start: 2013-02-25 — End: 2013-02-27

## 2013-02-25 MED ORDER — CALCIUM CARBONATE-VITAMIN D 500-200 MG-UNIT PO TABS
1.0000 | ORAL_TABLET | Freq: Two times a day (BID) | ORAL | Status: DC
  Administered 2013-02-25 – 2013-02-26 (×3): 1 via ORAL
  Filled 2013-02-25 (×5): qty 1

## 2013-02-25 MED ORDER — GLYCOPYRROLATE 0.2 MG/ML IJ SOLN
INTRAMUSCULAR | Status: DC | PRN
  Administered 2013-02-25: 0.4 mg via INTRAVENOUS

## 2013-02-25 MED ORDER — ROCURONIUM BROMIDE 100 MG/10ML IV SOLN
INTRAVENOUS | Status: DC | PRN
  Administered 2013-02-25: 40 mg via INTRAVENOUS

## 2013-02-25 MED ORDER — DOCUSATE SODIUM 100 MG PO CAPS
100.0000 mg | ORAL_CAPSULE | Freq: Two times a day (BID) | ORAL | Status: DC
Start: 2013-02-25 — End: 2013-02-27
  Administered 2013-02-25 – 2013-02-26 (×3): 100 mg via ORAL
  Filled 2013-02-25 (×5): qty 1

## 2013-02-25 MED ORDER — LABETALOL HCL 5 MG/ML IV SOLN
INTRAVENOUS | Status: AC
  Administered 2013-02-25: 11:00:00
  Filled 2013-02-25: qty 4

## 2013-02-25 MED ORDER — NEBIVOLOL HCL 10 MG PO TABS
20.0000 mg | ORAL_TABLET | Freq: Every day | ORAL | Status: DC
Start: 2013-02-26 — End: 2013-02-27
  Administered 2013-02-26: 20 mg via ORAL
  Filled 2013-02-25 (×2): qty 2

## 2013-02-25 MED ORDER — MAGNESIUM OXIDE 400 (241.3 MG) MG PO TABS
400.0000 mg | ORAL_TABLET | Freq: Every day | ORAL | Status: DC
  Administered 2013-02-25 – 2013-02-26 (×2): 400 mg via ORAL
  Filled 2013-02-25 (×3): qty 1

## 2013-02-25 MED ORDER — FENTANYL CITRATE 0.05 MG/ML IJ SOLN
INTRAMUSCULAR | Status: AC
  Administered 2013-02-25: 11:00:00
  Filled 2013-02-25: qty 2

## 2013-02-25 MED ORDER — PANTOPRAZOLE SODIUM 40 MG PO TBEC
40.0000 mg | DELAYED_RELEASE_TABLET | Freq: Every day | ORAL | Status: DC
  Administered 2013-02-25 – 2013-02-26 (×2): 40 mg via ORAL
  Filled 2013-02-25 (×2): qty 1

## 2013-02-25 MED ORDER — CEFAZOLIN SODIUM-DEXTROSE 2-3 GM-% IV SOLR
INTRAVENOUS | Status: AC
  Administered 2013-02-25: 07:00:00
  Filled 2013-02-25: qty 50

## 2013-02-25 MED ORDER — SODIUM CHLORIDE 0.9 % IR SOLN
Status: DC | PRN
  Administered 2013-02-25: 2000 mL

## 2013-02-25 MED ORDER — HYDROCODONE-ACETAMINOPHEN 10-325 MG PO TABS
1.0000 | ORAL_TABLET | ORAL | Status: DC | PRN
  Administered 2013-02-25 – 2013-02-27 (×6): 2 via ORAL
  Filled 2013-02-25 (×7): qty 2

## 2013-02-25 MED ORDER — SUCRALFATE 1 G PO TABS
1.0000 g | ORAL_TABLET | Freq: Every day | ORAL | Status: DC
  Administered 2013-02-26: 1 g via ORAL
  Filled 2013-02-25 (×3): qty 1

## 2013-02-25 MED ORDER — ONDANSETRON HCL 4 MG/2ML IJ SOLN
INTRAMUSCULAR | Status: DC | PRN
  Administered 2013-02-25 (×2): 4 mg via INTRAVENOUS

## 2013-02-25 MED ORDER — ONDANSETRON HCL 4 MG/2ML IJ SOLN: 4.0000 mg | Freq: Once | INTRAMUSCULAR | Status: DC | PRN

## 2013-02-25 MED ORDER — ACETAMINOPHEN 10 MG/ML IV SOLN
INTRAVENOUS | Status: AC
  Administered 2013-02-25: 1000 mg
  Filled 2013-02-25: qty 100

## 2013-02-25 MED ORDER — KETOROLAC TROMETHAMINE 15 MG/ML IJ SOLN
7.5000 mg | Freq: Four times a day (QID) | INTRAMUSCULAR | Status: AC
  Administered 2013-02-25 – 2013-02-26 (×3): 7.5 mg via INTRAVENOUS
  Filled 2013-02-25 (×4): qty 1

## 2013-02-25 MED ORDER — ACETAMINOPHEN 325 MG PO TABS: 650.0000 mg | ORAL_TABLET | Freq: Four times a day (QID) | ORAL | Status: DC | PRN

## 2013-02-25 MED ORDER — METOCLOPRAMIDE HCL 10 MG PO TABS: 5.0000 mg | ORAL_TABLET | Freq: Three times a day (TID) | ORAL | Status: DC | PRN

## 2013-02-25 MED ORDER — FENTANYL CITRATE 0.05 MG/ML IJ SOLN
INTRAMUSCULAR | Status: DC | PRN
  Administered 2013-02-25 (×2): 50 ug via INTRAVENOUS
  Administered 2013-02-25: 100 ug via INTRAVENOUS
  Administered 2013-02-25 (×2): 25 ug via INTRAVENOUS

## 2013-02-25 MED ORDER — LABETALOL HCL 5 MG/ML IV SOLN
5.0000 mg | Freq: Once | INTRAVENOUS | Status: AC
  Administered 2013-02-25 (×2): 5 mg via INTRAVENOUS

## 2013-02-25 MED ORDER — VITAMIN D 50 MCG (2000 UT) PO TABS: 2000.0000 [IU] | ORAL_TABLET | Freq: Every day | ORAL | Status: DC

## 2013-02-25 MED ORDER — POTASSIUM CHLORIDE IN NACL 20-0.9 MEQ/L-% IV SOLN
INTRAVENOUS | Status: DC
  Administered 2013-02-25 – 2013-02-26 (×2): via INTRAVENOUS
  Filled 2013-02-25 (×7): qty 1000

## 2013-02-25 MED ORDER — DEXAMETHASONE SODIUM PHOSPHATE 4 MG/ML IJ SOLN
INTRAMUSCULAR | Status: DC | PRN
  Administered 2013-02-25: 8 mg via INTRAVENOUS

## 2013-02-25 SURGICAL SUPPLY — 71 items
BANDAGE ESMARK 6X9 LF (GAUZE/BANDAGES/DRESSINGS) ×1 IMPLANT
BLADE SAGITTAL 25.0X1.19X90 (BLADE) ×2 IMPLANT
BLADE SAGITTAL 25.0X1.19X90MM (BLADE) ×1
BLADE SAW SGTL 11.0X1.19X90.0M (BLADE) IMPLANT
BLADE SAW SGTL 13.0X1.19X90.0M (BLADE) ×3 IMPLANT
BLADE SURG 10 STRL SS (BLADE) ×6 IMPLANT
BNDG ELASTIC 6X15 VLCR STRL LF (GAUZE/BANDAGES/DRESSINGS) ×3 IMPLANT
BNDG ESMARK 6X9 LF (GAUZE/BANDAGES/DRESSINGS) ×3
BNDG GAUZE ELAST 4 BULKY (GAUZE/BANDAGES/DRESSINGS) ×3 IMPLANT
BOWL SMART MIX CTS (DISPOSABLE) ×3 IMPLANT
CAP KNEE TOTAL OXINIUM W/POLY ×3 IMPLANT
CEMENT HV SMART SET (Cement) ×3 IMPLANT
CLOSURE WOUND 1/2 X4 (GAUZE/BANDAGES/DRESSINGS) ×1
CLOTH BEACON ORANGE TIMEOUT ST (SAFETY) ×3 IMPLANT
COVER SURGICAL LIGHT HANDLE (MISCELLANEOUS) ×3 IMPLANT
CUFF TOURNIQUET SINGLE 34IN LL (TOURNIQUET CUFF) ×3 IMPLANT
CUFF TOURNIQUET SINGLE 44IN (TOURNIQUET CUFF) IMPLANT
DRAPE EXTREMITY T 121X128X90 (DRAPE) ×3 IMPLANT
DRAPE INCISE IOBAN 66X45 STRL (DRAPES) ×3 IMPLANT
DRAPE PROXIMA HALF (DRAPES) ×3 IMPLANT
DRAPE U-SHAPE 47X51 STRL (DRAPES) ×3 IMPLANT
DRSG ADAPTIC 3X8 NADH LF (GAUZE/BANDAGES/DRESSINGS) ×3 IMPLANT
DRSG PAD ABDOMINAL 8X10 ST (GAUZE/BANDAGES/DRESSINGS) ×6 IMPLANT
DURAPREP 26ML APPLICATOR (WOUND CARE) ×6 IMPLANT
ELECT CAUTERY BLADE 6.4 (BLADE) ×3 IMPLANT
ELECT REM PT RETURN 9FT ADLT (ELECTROSURGICAL) ×3
ELECTRODE REM PT RTRN 9FT ADLT (ELECTROSURGICAL) ×1 IMPLANT
EVACUATOR 1/8 PVC DRAIN (DRAIN) ×3 IMPLANT
FACESHIELD LNG OPTICON STERILE (SAFETY) ×3 IMPLANT
GLOVE BIO SURGEON STRL SZ7 (GLOVE) ×3 IMPLANT
GLOVE BIOGEL PI IND STRL 7.0 (GLOVE) ×1 IMPLANT
GLOVE BIOGEL PI IND STRL 7.5 (GLOVE) ×1 IMPLANT
GLOVE BIOGEL PI INDICATOR 7.0 (GLOVE) ×2
GLOVE BIOGEL PI INDICATOR 7.5 (GLOVE) ×2
GLOVE SS BIOGEL STRL SZ 7.5 (GLOVE) ×1 IMPLANT
GLOVE SUPERSENSE BIOGEL SZ 7.5 (GLOVE) ×2
GOWN PREVENTION PLUS XLARGE (GOWN DISPOSABLE) ×6 IMPLANT
GOWN STRL NON-REIN LRG LVL3 (GOWN DISPOSABLE) ×6 IMPLANT
HANDPIECE INTERPULSE COAX TIP (DISPOSABLE) ×2
HOOD PEEL AWAY FACE SHEILD DIS (HOOD) ×6 IMPLANT
IMMOBILIZER KNEE 22 UNIV (SOFTGOODS) IMPLANT
KIT BASIN OR (CUSTOM PROCEDURE TRAY) ×3 IMPLANT
KIT ROOM TURNOVER OR (KITS) ×3 IMPLANT
MANIFOLD NEPTUNE II (INSTRUMENTS) ×3 IMPLANT
NS IRRIG 1000ML POUR BTL (IV SOLUTION) ×3 IMPLANT
PACK TOTAL JOINT (CUSTOM PROCEDURE TRAY) ×3 IMPLANT
PAD ABD 8X10 STRL (GAUZE/BANDAGES/DRESSINGS) ×3 IMPLANT
PAD ARMBOARD 7.5X6 YLW CONV (MISCELLANEOUS) ×6 IMPLANT
PAD CAST 4YDX4 CTTN HI CHSV (CAST SUPPLIES) ×1 IMPLANT
PADDING CAST COTTON 4X4 STRL (CAST SUPPLIES) ×2
PADDING CAST COTTON 6X4 STRL (CAST SUPPLIES) ×3 IMPLANT
PIN TROCAR 3 INCH (PIN) ×6 IMPLANT
RUBBERBAND STERILE (MISCELLANEOUS) ×3 IMPLANT
SAGITTAL BLADE FLARED ×3 IMPLANT
SET HNDPC FAN SPRY TIP SCT (DISPOSABLE) ×1 IMPLANT
SPONGE GAUZE 4X4 12PLY (GAUZE/BANDAGES/DRESSINGS) ×3 IMPLANT
SPONGE GAUZE 4X4 12PLY STER LF (GAUZE/BANDAGES/DRESSINGS) ×3 IMPLANT
STRIP CLOSURE SKIN 1/2X4 (GAUZE/BANDAGES/DRESSINGS) ×2 IMPLANT
STRYKER SAGGITAL BLADE ×3 IMPLANT
SUCTION FRAZIER TIP 10 FR DISP (SUCTIONS) ×3 IMPLANT
SUT ETHIBOND NAB CT1 #1 30IN (SUTURE) ×6 IMPLANT
SUT MNCRL AB 3-0 PS2 18 (SUTURE) ×3 IMPLANT
SUT VIC AB 0 CT1 27 (SUTURE) ×4
SUT VIC AB 0 CT1 27XBRD ANBCTR (SUTURE) ×2 IMPLANT
SUT VIC AB 2-0 CT1 27 (SUTURE) ×4
SUT VIC AB 2-0 CT1 TAPERPNT 27 (SUTURE) ×2 IMPLANT
SYR 30ML SLIP (SYRINGE) ×3 IMPLANT
TOWEL OR 17X24 6PK STRL BLUE (TOWEL DISPOSABLE) ×3 IMPLANT
TOWEL OR 17X26 10 PK STRL BLUE (TOWEL DISPOSABLE) ×3 IMPLANT
TRAY FOLEY CATH 16FR SILVER (SET/KITS/TRAYS/PACK) ×3 IMPLANT
WATER STERILE IRR 1000ML POUR (IV SOLUTION) ×6 IMPLANT

## 2013-02-25 NOTE — Transfer of Care (Signed)
Immediate Anesthesia Transfer of Care Note  Patient: [name]  Procedure(s) Performed: Procedure(s): TOTAL KNEE ARTHROPLASTY (Right)  Patient Location: PACU  Anesthesia Type:General  Level of Consciousness: sedated  Airway & Oxygen Therapy: Patient Spontanous Breathing and Patient connected to nasal cannula oxygen  Post-op Assessment: Report given to PACU RN, Post -op Vital signs reviewed and stable and Patient moving all extremities X 4  Post vital signs: Reviewed and stable  Complications: No apparent anesthesia complications

## 2013-02-25 NOTE — Progress Notes (Signed)
Paged Dr. Noemi Chapel for orders at [place] he returned my call stated to contact the PA. Paged kristin shepperdson,PA for orders, she gave telephone orders.

## 2013-02-25 NOTE — Progress Notes (Signed)
Orthopedic Tech Progress Note Patient Details:  [name] 1950-05-03 [id number] CPM applied to Right LE with appropriate settings. OHF applied to bed. Footsie roll provided. Patient has knee immobilizer.  CPM Right Knee CPM Right Knee: On Right Knee Flexion (Degrees): 60 Right Knee Extension (Degrees): 0   Asia R Thompson 02/25/2013, 10:48 AM

## 2013-02-25 NOTE — Anesthesia Preprocedure Evaluation (Addendum)
Anesthesia Evaluation  Patient identified by MRN, date of birth, ID band Patient awake    Reviewed: Allergy & Precautions, H&P , NPO status , Patient's Chart, lab work & pertinent test results, reviewed documented beta blocker date and time   History of Anesthesia Complications (+) PONV  Airway Mallampati: II TM Distance: >3 FB Neck ROM: Full    Dental  (+) Teeth Intact   Pulmonary former smoker,  breath sounds clear to auscultation        Cardiovascular hypertension, Pt. on home beta blockers Rhythm:Regular Rate:Normal     Neuro/Psych    GI/Hepatic GERD-  Medicated and Controlled,  Endo/Other    Renal/GU      Musculoskeletal   Abdominal   Peds  Hematology   Anesthesia Other Findings   Reproductive/Obstetrics                         Anesthesia Physical Anesthesia Plan  ASA: III  Anesthesia Plan: General   Post-op Pain Management:    Induction: Intravenous  Airway Management Planned: Oral ETT  Additional Equipment:   Intra-op Plan:   Post-operative Plan: Extubation in OR  Informed Consent: I have reviewed the patients History and Physical, chart, labs and discussed the procedure including the risks, benefits and alternatives for the proposed anesthesia with the patient or authorized representative who has indicated his/her understanding and acceptance.   Dental advisory given  Plan Discussed with: Anesthesiologist and CRNA  Anesthesia Plan Comments:         Anesthesia Quick Evaluation

## 2013-02-25 NOTE — Evaluation (Signed)
Physical Therapy Evaluation Patient Details Name: KHRISTIN KELEHER MRN: [id number] DOB: 03-18-1950 Today's Date: 02/25/2013 Time: 5277-8242 PT Time Calculation (min): 31 min  PT Assessment / Plan / Recommendation History of Present Illness  Patient is a 63 yo female s/p Rt TKA.  Patient with h/o CVA, RA, HTN.  Clinical Impression  Patient presents with problems listed below.  Will benefit from acute PT to maximize independence prior to discharge home with husband.    Spoke with CM - platform missing from patient's RW.  CM contacted equipment company - they will bring to patient's room.    PT Assessment  Patient needs continued PT services    Follow Up Recommendations  Home health PT;Supervision/Assistance - 24 hour    Does the patient have the potential to tolerate intense rehabilitation      Barriers to Discharge        Equipment Recommendations  None recommended by PT    Recommendations for Other Services     Frequency 7X/week    Precautions / Restrictions Precautions Precautions: Fall;Knee Precaution Booklet Issued: Yes (comment) Precaution Comments: Reviewed precautions with patient. Required Braces or Orthoses: Knee Immobilizer - Right Knee Immobilizer - Right: On except when in CPM;Discontinue once straight leg raise with < 10 degree lag Restrictions Weight Bearing Restrictions: Yes RLE Weight Bearing: Weight bearing as tolerated Other Position/Activity Restrictions: RA in wrists - needs bil. platform RW   Pertinent Vitals/Pain Pain limiting mobility      Mobility  Bed Mobility Overal bed mobility: Needs Assistance Bed Mobility: Supine to Sit Supine to sit: Min assist General bed mobility comments: Verbal cues for technique.  KI in place on RLE.  Assist to move RLE off of bed and move to sitting upright.  Once upright, patient with good balance. Transfers Overall transfer level: Needs assistance Equipment used: 1 person hand held assist Transfers: Sit  to/from [facility] Sit to Stand: Min assist Stand pivot transfers: Min assist General transfer comment: Verbal cues for technique.  Assist to rise to standing, and pivot to chair.    Exercises Total Joint Exercises Ankle Circles/Pumps: AROM;Both;10 reps;Seated   PT Diagnosis: Difficulty walking;Generalized weakness;Acute pain  PT Problem List: Decreased strength;Decreased range of motion;Decreased activity tolerance;Decreased balance;Decreased mobility;Decreased knowledge of use of DME;Decreased knowledge of precautions;Pain PT Treatment Interventions: DME instruction;Gait training;Functional mobility training;Therapeutic exercise;Patient/family education     PT Goals(Current goals can be found in the care plan section) Acute Rehab PT Goals Patient Stated Goal: To be able to go home PT Goal Formulation: With patient Time For Goal Achievement: 03/04/13 Potential to Achieve Goals: Good  Visit Information  Last PT Received On: 02/25/13 Assistance Needed: +1 History of Present Illness: Patient is a 63 yo female s/p Rt TKA.  Patient with h/o CVA, RA, HTN.       Prior [place] expects to be discharged to:: Private residence Living Arrangements: Spouse/significant other Available Help at Discharge: Family;Available 24 hours/day Type of Home: House Home Access: Level entry Home Layout: Two level;Laundry or work area in basement;Able to live on main level with bedroom/bathroom Alternate Therapist, sports of Steps: 10 Alternate Level Stairs-Rails: Right Home Equipment: Wheelchair - manual;Cane - single point;Bedside commode;Walker - standard (RW with bil. platform attachments) Prior Function Level of Independence: Independent Comments: Works full time Chief Executive Officer for [employer] Communication: No difficulties    Cognition  Cognition Arousal/Alertness: Awake/alert Behavior During Therapy: WFL for tasks  assessed/performed Overall Cognitive Status: Within Functional Limits for  tasks assessed    Extremity/Trunk Assessment Upper Extremity Assessment Upper Extremity Assessment: Generalized weakness (RA changes in wrists/hands) Lower Extremity Assessment Lower Extremity Assessment: RLE deficits/detail RLE Deficits / Details: Decreased strength and ROM due to surgery/pain.  Patient able to lift RLE off of bed. RLE: Unable to fully assess due to pain Cervical / Trunk Assessment Cervical / Trunk Assessment: Normal   Balance Balance Overall balance assessment: Needs assistance Sitting-balance support: No upper extremity supported;Feet supported Sitting balance-Leahy Scale: Good Standing balance support: Bilateral upper extremity supported Standing balance-Leahy Scale: Poor  End of Session PT - End of Session Equipment Utilized During Treatment: Gait belt;Right knee immobilizer Activity Tolerance: Patient limited by pain Patient left: in chair;with call bell/phone within reach Nurse Communication: Mobility status CPM Right Knee CPM Right Knee: Off  GP     Despina Pole 02/25/2013, 5:06 PM Carita Pian. Sanjuana Kava, [place] Pager [phone]

## 2013-02-25 NOTE — Op Note (Signed)
MRN:     [id number] DOB/AGE:    Jun 01, 1950 / 63 y.o.       OPERATIVE REPORT    DATE OF PROCEDURE:  02/25/2013       PREOPERATIVE DIAGNOSIS:   DJD RIGHT KNEE      Estimated body mass index is 34.39 kg/(m^2) as calculated from the following:   Height as of 02/19/13: 5' 7.5" (1.715 m).   Weight as of 08/23/12: 101.152 kg (223 lb).                                                        POSTOPERATIVE DIAGNOSIS:   OSTEOARTHRITIS RIGHT KNEE                                                                      PROCEDURE:  Procedure(s): TOTAL KNEE ARTHROPLASTY Using [employer]hew Oxinium implants #5 Femur, #4Tibia, 61mm bearing, 29 Patella     SURGEON: Shaney Deckman A    ASSISTANT:  Kirstin Shepperson PA-C   (Present and scrubbed throughout the case, critical for assistance with exposure, retraction, instrumentation, and closure.)         ANESTHESIA: GET with Femoral Nerve Block  DRAINS: foley, 2 medium hemovac in knee   TOURNIQUET TIME: 28UXL   COMPLICATIONS:  None     SPECIMENS: None   INDICATIONS FOR PROCEDURE: The patient has  DJD RIGHT KNEE, varus deformities, XR shows bone on bone arthritis. Patient has failed all conservative measures including anti-inflammatory medicines, narcotics, attempts at  exercise and weight loss, cortisone injections and viscosupplementation.  Risks and benefits of surgery have been discussed, questions answered.   DESCRIPTION OF PROCEDURE: The patient identified by armband, received  right femoral nerve block and IV antibiotics, in the holding area at [facility]. Patient taken to the operating room, appropriate anesthetic  monitors were attached General endotracheal anesthesia induced with  the patient in supine position, Foley catheter was inserted. Tourniquet  applied high to the operative thigh. Lateral post and foot positioner  applied to the table, the lower extremity was then prepped and draped  in usual sterile fashion from the ankle to the  tourniquet. Time-out procedure was performed. The limb was wrapped with an Esmarch bandage and the tourniquet inflated to 365 mmHg. We began the operation by making the anterior midline incision starting at handbreadth above the patella going over the patella 1 cm medial to and  4 cm distal to the tibial tubercle. Small bleeders in the skin and the  subcutaneous tissue identified and cauterized. Transverse retinaculum was incised and reflected medially and a medial parapatellar arthrotomy was accomplished. the patella was everted and theprepatellar fat pad resected. The superficial medial collateral  ligament was then elevated from anterior to posterior along the proximal  flare of the tibia and anterior half of the menisci resected. The knee was hyperflexed exposing bone on bone arthritis. Peripheral and notch osteophytes as well as the cruciate ligaments were then resected. We continued to  work our way around posteriorly along the proximal tibia, and externally  rotated the  tibia subluxing it out from underneath the femur. A McHale  retractor was placed through the notch and a lateral Hohmann retractor  placed, and we then drilled through the proximal tibia in line with the  axis of the tibia followed by an intramedullary guide rod and 2-degree  posterior slope cutting guide. The tibial cutting guide was pinned into place  allowing resection of 6 mm of bone medially and about 4 mm of bone  laterally because of her valgus deformity. Satisfied with the tibial resection, we then  entered the distal femur 2 mm anterior to the PCL origin with the  intramedullary guide rod and applied the distal femoral cutting guide  set at 35mm, with 5 degrees of valgus. This was pinned along the  epicondylar axis. At this point, the distal femoral cut was accomplished without difficulty. We then sized for a #5 femoral component and pinned the guide in 3 degrees of external rotation.The chamfer cutting guide was pinned  into place. The anterior, posterior, and chamfer cuts were accomplished without difficulty followed by  the  box cutting guide and the box cut. We also removed posterior osteophytes from the posterior femoral condyles. At this  time, the knee was brought into full extension. We checked our  extension and flexion gaps and found them symmetric at 49mm.  The patella thickness measured at 21 mm. We set the cutting guide at 13 and removed the posterior 8 mm  of the patella sized for 29 button and drilled the lollipop. The knee  was then once again hyperflexed exposing the proximal tibia. We sized for a #4 tibial base plate, applied the smokestack and the conical reamer followed by the the Delta fin keel punch. We then hammered into place the  trial femoral component, inserted a 9-mm trial bearing, trial patellar button, and took the knee through range of motion from 0-130 degrees. No thumb pressure was required for patellar  tracking. At this point, all trial components were removed, a double batch of DePuy HV cement with was mixed and applied to all bony metallic mating surfaces except for the posterior condyles of the femur itself. In order, we  hammered into place the tibial tray and removed excess cement, the femoral component and removed excess cement, a 15mm bearing  was inserted, and the knee brought to full extension with compression.  The patellar button was clamped into place, and excess cement  removed. While the cement cured the wound was irrigated out with normal saline solution pulse lavage, and medium Hemovac drains were placed.. Ligament stability and patellar tracking were checked and found to be excellent. The tourniquet was then released and hemostasis was obtained with cautery. The parapatellar arthrotomy was closed with  #1 ethibond suture. The subcutaneous tissue with 0 and 2-0 undyed  Vicryl suture, and 4-0 Monocryl.. A dressing of Xeroform,  4 x 4, dressing sponges, Webril, and Ace wrap  applied. Needle and sponge count were correct times 2.The patient awakened, extubated, and taken to recovery room without difficulty. Vascular status was normal, pulses 2+ and symmetric.   [name] A 02/25/2013, 9:03 AM

## 2013-02-25 NOTE — Anesthesia Postprocedure Evaluation (Signed)
  Anesthesia Post-op Note  Patient: [name]  Procedure(s) Performed: Procedure(s): TOTAL KNEE ARTHROPLASTY (Right)  Patient Location: PACU  Anesthesia Type:General  Level of Consciousness: awake, alert  and oriented  Airway and Oxygen Therapy: Patient Spontanous Breathing and Patient connected to nasal cannula oxygen  Post-op Pain: mild  Post-op Assessment: Post-op Vital signs reviewed, Patient's Cardiovascular Status Stable, Respiratory Function Stable and Patent Airway  Post-op Vital Signs: stable  Complications: No apparent anesthesia complications

## 2013-02-25 NOTE — H&P (View-Only) (Signed)
TOTAL KNEE ADMISSION H&P  Patient is being admitted for right total knee arthroplasty.  Subjective:  Chief Complaint:right knee pain.  HPI: [name], 63 y.o. female, has a history of pain and functional disability in the right knee due to arthritis and has failed non-surgical conservative treatments for greater than 12 weeks to includeNSAID's and/or analgesics, corticosteriod injections, viscosupplementation injections, flexibility and strengthening excercises, supervised PT with diminished ADL's post treatment, use of assistive devices and activity modification.  Onset of symptoms was gradual, starting 10 years ago with gradually worsening course since that time. The patient noted no past surgery on the right knee(s).  Patient currently rates pain in the right knee(s) at 10 out of 10 with activity. Patient has night pain, worsening of pain with activity and weight bearing, pain that interferes with activities of daily living, crepitus and joint swelling.  Patient has evidence of subchondral sclerosis, joint subluxation and joint space narrowing by imaging studies.There is no active infection.  Patient Active Problem List   Diagnosis Date Noted  . PONV (postoperative nausea and vomiting)   . Stroke   . Hypercholesterolemia   . Cancer   . Colon polyps   . Arthritis   . Rheumatoid arthritis with rheumatoid factor   . Right knee DJD   . Asthenia 08/14/2012  . Cerebral aneurysm, nonruptured 08/14/2012  . TIA (transient ischemic attack) 08/14/2012  . RA (rheumatoid arthritis) 10/27/2011  . High cholesterol 10/27/2011  . Hypertension 10/27/2011  . GERD (gastroesophageal reflux disease) 10/27/2011   Past Medical History  Diagnosis Date  . PONV (postoperative nausea and vomiting)   . Stroke     jan 2012  . Hypertension   . Angina   . Hypercholesterolemia   . Numbness and tingling in right hand   . GERD (gastroesophageal reflux disease)   . Constipation   . Diarrhea   . Colon  polyps   . Arthritis     Rheumatoid  . Cancer     left breast, cervical cancer  . Rheumatoid arthritis with rheumatoid factor   . Right knee DJD     Past Surgical History  Procedure Laterality Date  . Right brain aneurysm  04/2010    Coiled   . Abdominal hysterectomy  1977  . Cholecystectomy    . Bilateral oophorectomy    . Left knee arthroscopy    . Right wrist surgery  1999    synevectomy  . Left breast lumpectomy  2004  . Appendectomy  1964  . Colonoscopy  12/09/2010    Procedure: COLONOSCOPY;  Surgeon: Rogene Houston, MD;  Location: [facility];  Service: Endoscopy;  Laterality: N/A;     (Not in a hospital admission) Allergies  Allergen Reactions  . Aurothioglucose [Solganal] Other (See Comments)    REACTION: VASCULITIS   . Calcium Channel Blockers Palpitations  . Feldene [Piroxicam] Anaphylaxis and Other (See Comments)    SKIN BLISTERS  . Hydralazine Other (See Comments)    REACTION: Chest pains and headaches: IV only  . Imdur [Isosorbide Mononitrate] Other (See Comments)    REACTION: PATIENT IS UNABLE TO WALK OR FUNCTION  . Methotrexate Derivatives Other (See Comments)    REACTION: VASCULITIS  . Nickel Rash  . Thimerosal Other (See Comments)    SKIN BLISTERED  . Percocet [Oxycodone-Acetaminophen] Other (See Comments)    REACTION : INSOMNIA  . Vioxx [Rofecoxib] Other (See Comments)    REACTION: CAUSED MORE JOINT PAIN  . Arava [Leflunomide]  REACTION: UNKNOWN  . Asacol [Mesalamine] Other (See Comments)    REACTION: UNKNOWN  . Iohexol      Desc: PT STATES SHE WAS TOLD NEVER TO HAVE CT CONTRAST AGAIN.  SHE HAD SOME KIND OF REACTION BUT DOESNT REMEBER WHAT HAPPENED   . Lidocaine Other (See Comments)    REACTION: ONLY WITH THE PRESERVATIVES IN THE MEDICATION  . Sulfa Antibiotics     UNKNOWN  . Latex Rash  . Nsaids Other (See Comments)    GI UPSET , CAN TOLERATE SOME NSAIDS     Current Outpatient Prescriptions on File Prior to Visit  Medication Sig  Dispense Refill  . amitriptyline (ELAVIL) 50 MG tablet Take 50 mg by mouth at bedtime.        Marland Kitchen aspirin EC 325 MG tablet Take 325 mg by mouth daily.      Marland Kitchen BENICAR 40 MG tablet Take 40 mg by mouth daily.       . Calcium Carbonate-Vit D-Min (CALTRATE 600+D PLUS) 600-400 MG-UNIT per tablet Chew 1 tablet by mouth 2 (two) times daily.        . Cholecalciferol (VITAMIN D) 2000 UNITS tablet Take 2,000 Units by mouth daily.       . Coenzyme Q10 (CO Q-10) 200 MG CAPS Take 2 capsules by mouth daily.        . furosemide (LASIX) 40 MG tablet Take 40 mg by mouth daily.       . hydroxychloroquine (PLAQUENIL) 200 MG tablet Take 200 mg by mouth 2 (two) times daily.        . lansoprazole (PREVACID) 30 MG capsule Take 30 mg by mouth daily at 12 noon.      . magnesium oxide (MAG-OX) 400 MG tablet Take 400 mg by mouth daily.        . meloxicam (MOBIC) 15 MG tablet Take 15 mg by mouth daily.        . methylPREDNISolone (MEDROL) 4 MG tablet Take 4 mg by mouth daily.       . Nebivolol HCl (BYSTOLIC) 20 MG TABS Take 1 tablet by mouth daily.        . pravastatin (PRAVACHOL) 40 MG tablet Take 80 mg by mouth at bedtime.       . sucralfate (CARAFATE) 1 G tablet Take 1 g by mouth daily.      . traMADol (ULTRAM) 50 MG tablet Take 50 mg by mouth every 6 (six) hours as needed. FOR PAIN       . XELJANZ 5 MG TABS Take 5 mg by mouth 2 (two) times daily.       Vladimir Faster Glycol-Propyl Glycol (SYSTANE) 0.4-0.3 % GEL Apply 1 application to eye daily as needed. For dry eye relief       No current facility-administered medications on file prior to visit.   History  Substance Use Topics  . Smoking status: Former Smoker -- 2.00 packs/day for 15 years  . Smokeless tobacco: Not on file     Comment: Quit 1986  . Alcohol Use: No    Family History  Problem Relation Age of Onset  . Colon cancer Neg Hx   . Hypertension Father   . Heart attack Father      Review of Systems  Constitutional: Negative.   HENT: Negative.         History dental abcess Sept 2014.  Took antibiotics for it.  Got better but still has occasional flare.  She will see her dentist this  coming week to confirm no abcess  Eyes: Negative.   Cardiovascular: Negative.   Gastrointestinal: Negative.   Genitourinary: Negative.   Musculoskeletal: Positive for joint pain.  Skin: Negative.   Neurological: Negative.   Endo/Heme/Allergies: Negative.   Psychiatric/Behavioral: Negative.     Objective:  Physical Exam  Constitutional: She is oriented to person, place, and time. She appears well-developed and well-nourished.  HENT:  Head: Normocephalic and atraumatic.  Eyes: Conjunctivae are normal. Pupils are equal, round, and reactive to light.  Neck: Normal range of motion. Neck supple.  Cardiovascular: Normal rate, regular rhythm and intact distal pulses.  Exam reveals no gallop and no friction rub.   Murmur heard. Respiratory: Effort normal and breath sounds normal. No respiratory distress. She has no wheezes. She has no rales. She exhibits no tenderness.  GI: Soft. Bowel sounds are normal. She exhibits no distension and no mass. There is no tenderness. There is no rebound and no guarding.  Genitourinary:  Not pertinent to current symptomatology therefore not examined.  Musculoskeletal:  Examination of her right knee reveals mild valgus deformity.  Pain laterally.  1+ synovitis.  1+ crepitation.  Range of motion from -5 to 125 degrees.  Knee is stable with normal patella tracking and diffuse pain.  Examination of the left knee reveals full range of motion without pain, swelling, weakness or instability.  Vascular exam: Pulses are 2+ and symmetric.    Neurological: She is alert and oriented to person, place, and time.  Skin: Skin is warm and dry.  Psychiatric: She has a normal mood and affect. Her behavior is normal.    Vital signs in last 24 hours: Last recorded: 12/31 0900   BP: 160/75 Pulse: 66  Temp: 98.2 F (36.8 C)    Height: 5\' 7"   (1.702 m) SpO2: 99  Weight: 97.977 kg (216 lb)     Labs:   Estimated body mass index is 33.82 kg/(m^2) as calculated from the following:   Height as of this encounter: 5\' 7"  (1.702 m).   Weight as of this encounter: 97.977 kg (216 lb).   Imaging Review Plain radiographs demonstrate severe degenerative joint disease of the right knee(s). The overall alignment ismild valgus. The bone quality appears to be good for age and reported activity level.  Assessment/Plan:  End stage arthritis, right knee  Patient Active Problem List   Diagnosis Date Noted  . PONV (postoperative nausea and vomiting)   . Stroke   . Hypercholesterolemia   . Cancer   . Colon polyps   . Arthritis   . Rheumatoid arthritis with rheumatoid factor   . Right knee DJD   . Asthenia 08/14/2012  . Cerebral aneurysm, nonruptured 08/14/2012  . TIA (transient ischemic attack) 08/14/2012  . RA (rheumatoid arthritis) 10/27/2011  . High cholesterol 10/27/2011  . Hypertension 10/27/2011  . GERD (gastroesophageal reflux disease) 10/27/2011   The patient history, physical examination, clinical judgment of the provider and imaging studies are consistent with end stage degenerative joint disease of the right knee(s) and total knee arthroplasty is deemed medically necessary. The treatment options including medical management, injection therapy arthroscopy and arthroplasty were discussed at length. The risks and benefits of total knee arthroplasty were presented and reviewed. The risks due to aseptic loosening, infection, stiffness, patella tracking problems, thromboembolic complications and other imponderables were discussed. The patient acknowledged the explanation, agreed to proceed with the plan and consent was signed. Patient is being admitted for inpatient treatment for surgery, pain control, PT, OT, prophylactic  antibiotics, VTE prophylaxis, progressive ambulation and ADL's and discharge planning. The patient is planning to  be discharged home with home health services  Andrew Soria A. Kaleen Mask Physician Assistant [facility] [phone]  02/13/2013, 11:13 AM

## 2013-02-25 NOTE — Preoperative (Signed)
Beta Blockers   Reason not to administer Beta Blockers:Not Applicable 

## 2013-02-25 NOTE — Anesthesia Procedure Notes (Addendum)
Procedure Name: Intubation Date/Time: 02/25/2013 7:23 AM Performed by: Rush Farmer E Pre-anesthesia Checklist: Patient identified, Emergency Drugs available, Suction available, Patient being monitored and Timeout performed Patient Re-evaluated:Patient Re-evaluated prior to inductionOxygen Delivery Method: Circle system utilized Preoxygenation: Pre-oxygenation with 100% oxygen Intubation Type: IV induction Ventilation: Mask ventilation without difficulty Laryngoscope Size: Mac and 3 Grade View: Grade III Tube type: Oral Tube size: 7.0 mm Number of attempts: 1 Airway Equipment and Method: Stylet Placement Confirmation: positive ETCO2 and breath sounds checked- equal and bilateral Secured at: 21 cm Tube secured with: Tape Dental Injury: Teeth and Oropharynx as per pre-operative assessment    Anesthesia Regional Block:  Adductor canal block  Pre-Anesthetic Checklist: ,, timeout performed, Correct Patient, Correct Site, Correct Laterality, Correct Procedure, Correct Position, site marked, Risks and benefits discussed,  Surgical consent,  Pre-op evaluation,  At surgeon's request and post-op pain management  Laterality: Right  Prep: chloraprep       Needles:  Injection technique: Single-shot  Needle Type: Echogenic Stimulator Needle     Needle Length:cm 9 cm Needle Gauge: 22 and 22 G    Additional Needles:  Procedures: ultrasound guided (picture in chart) Adductor canal block Narrative:  Start time: 02/25/2013 7:05 AM End time: 02/25/2013 7:10 AM Injection made incrementally with aspirations every 5 mL.  Performed by: Personally   Additional Notes: 30 cc 0.5% Marcaine 1:200 Epi injected easily

## 2013-02-25 NOTE — Interval H&P Note (Signed)
History and Physical Interval Note:  02/25/2013 7:01 AM  [name]  has presented today for surgery, with the diagnosis of DJD RIGHT KNEE  The various methods of treatment have been discussed with the patient and family. After consideration of risks, benefits and other options for treatment, the patient has consented to  Procedure(s): TOTAL KNEE ARTHROPLASTY (Right) as a surgical intervention .  The patient's history has been reviewed, patient examined, no change in status, stable for surgery.  I have reviewed the patient's chart and labs.  Questions were answered to the patient's satisfaction.     [name]

## 2013-02-26 DIAGNOSIS — D62 Acute posthemorrhagic anemia: Secondary | ICD-10-CM | POA: Diagnosis not present

## 2013-02-26 DIAGNOSIS — D649 Anemia, unspecified: Secondary | ICD-10-CM | POA: Diagnosis present

## 2013-02-26 LAB — BASIC METABOLIC PANEL
BUN: 11 mg/dL (ref 6–23)
CO2: 25 meq/L (ref 19–32)
CREATININE: 0.84 mg/dL (ref 0.50–1.10)
Calcium: 8.8 mg/dL (ref 8.4–10.5)
Chloride: 105 mEq/L (ref 96–112)
GFR calc Af Amer: 84 mL/min — ABNORMAL LOW (ref >90)
GFR, EST NON AFRICAN AMERICAN: 72 mL/min — AB (ref >90)
GLUCOSE: 147 mg/dL — AB (ref 70–99)
Potassium: 4.4 mEq/L (ref 3.7–5.3)
Sodium: 143 mEq/L (ref 137–147)

## 2013-02-26 LAB — URINE CULTURE
COLONY COUNT: NO GROWTH
CULTURE: NO GROWTH

## 2013-02-26 LAB — CBC
HEMATOCRIT: 25.6 % — AB (ref 36.0–46.0)
HEMOGLOBIN: 7.9 g/dL — AB (ref 12.0–15.0)
MCH: 24.7 pg — ABNORMAL LOW (ref 26.0–34.0)
MCHC: 30.9 g/dL (ref 30.0–36.0)
MCV: 80 fL (ref 78.0–100.0)
Platelets: 264 10*3/uL (ref 150–400)
RBC: 3.2 MIL/uL — AB (ref 3.87–5.11)
RDW: 16.9 % — ABNORMAL HIGH (ref 11.5–15.5)
WBC: 19.6 10*3/uL — ABNORMAL HIGH (ref 4.0–10.5)

## 2013-02-26 LAB — PREPARE RBC (CROSSMATCH)

## 2013-02-26 MED ORDER — FUROSEMIDE 10 MG/ML IJ SOLN
20.0000 mg | Freq: Once | INTRAMUSCULAR | Status: AC
  Administered 2013-02-26: 20 mg via INTRAVENOUS
  Filled 2013-02-26: qty 2

## 2013-02-26 NOTE — Progress Notes (Signed)
Orthopedic Tech Progress Note Patient Details:  [name] 07/28/1950 [id number] CPM applied for 1500 rounds CPM Right Knee CPM Right Knee: On Right Knee Flexion (Degrees): 60 Right Knee Extension (Degrees): 0   Asia R Thompson 02/26/2013, 2:54 PM

## 2013-02-26 NOTE — Evaluation (Signed)
Occupational Therapy Evaluation Patient Details Name: [name] MRN: [id number] DOB: 1950/10/27 Today's Date: 02/26/2013 Time: 1327-1400 OT Time Calculation (min): 33 min  OT Assessment / Plan / Recommendation History of present illness Patient is a 63 yo female s/p Rt TKA.  Patient with h/o CVA, RA, HTN.   Clinical Impression   Pt is currently supervision to modified independent for toileting, LB selfcare, and shower tub transfers.  Will not need any follow-up OT or DME as she already has a 3:1 and RW with platforms.  She plans to borrow a tub bench for home.  No further OT needs.    OT Assessment  Patient does not need any further OT services    Follow Up Recommendations  No OT follow up       Equipment Recommendations  None recommended by OT          Precautions / Restrictions Precautions Precautions: Fall;Knee Precaution Comments: Reviewed precautions with patient. Required Braces or Orthoses: Knee Immobilizer - Right Knee Immobilizer - Right: On except when in CPM;Discontinue once straight leg raise with < 10 degree lag Restrictions Weight Bearing Restrictions: No RLE Weight Bearing: Weight bearing as tolerated Other Position/Activity Restrictions: RA in wrists - needs bil. platform RW   Pertinent Vitals/Pain Pt with pain at 2/10 on the faces scale, meds given prior to session and pt repositioned in chair at end of session.    ADL  Eating/Feeding: Simulated;Independent Where Assessed - Eating/Feeding: Chair Grooming: Simulated;Modified independent Where Assessed - Grooming: Supported standing Upper Body Bathing: Simulated;Set up Where Assessed - Upper Body Bathing: Unsupported sitting Lower Body Bathing: Simulated;Supervision/safety Where Assessed - Lower Body Bathing: Supported sit to stand Upper Body Dressing: Simulated;Set up Where Assessed - Upper Body Dressing: Unsupported sitting Lower Body Dressing: Simulated;Supervision/safety Where Assessed - Lower  Body Dressing: Supported sit to stand Toilet Transfer: Performed;Modified independent Toilet Transfer Method: Arts development officer: Therapist, occupational and Hygiene: Simulated;Modified independent Where Assessed - Toileting Clothing Manipulation and Hygiene: Sit to stand from 3-in-1 or toilet Tub/Shower Transfer: Performed;Supervision/safety Tub/Shower Transfer Method: Therapist, art: IT consultant Used: Rolling walker (RW with bilateral platforms) Transfers/Ambulation Related to ADLs: Pt is currently modified independent for mobility using the RW with bilateral platforms. ADL Comments: Pt proficient with use of RW with bilateral platforms for mobility.  practiced tub/shower transfer using tub bench.  Pt has smaller shower seat at home but does not have a tub bench.  Plans to try and borrow.        Visit Information  Last OT Received On: 02/26/13 Assistance Needed: +1 History of Present Illness: Patient is a 63 yo female s/p Rt TKA.  Patient with h/o CVA, RA, HTN.       Prior [place] expects to be discharged to:: Private residence Living Arrangements: Spouse/significant other Available Help at Discharge: Family;Available 24 hours/day Type of Home: House Home Access: Level entry Home Layout: Two level;Laundry or work area in basement;Able to live on main level with bedroom/bathroom Alternate Therapist, sports of Steps: 10 Alternate Level Stairs-Rails: Right Home Equipment: Wheelchair - manual;Cane - single point;Bedside commode;Walker - standard (RW with bil. platform attachments) Prior Function Level of Independence: Independent Comments: Works full time Chief Executive Officer for [employer] Communication: No difficulties Dominant Hand: Right         Vision/Perception Vision - History Baseline Vision: No visual deficits Patient Visual  Report: No change from baseline  Vision - Assessment Eye Alignment: Within Functional Limits Vision Assessment: Vision not tested Perception Perception: Within Functional Limits Praxis Praxis: Intact   Cognition  Cognition Arousal/Alertness: Awake/alert Behavior During Therapy: WFL for tasks assessed/performed Overall Cognitive Status: Within Functional Limits for tasks assessed    Extremity/Trunk Assessment Upper Extremity Assessment Upper Extremity Assessment: Generalized weakness (pt with history of RA affecting wrists but AROM WFLs at this time) Lower Extremity Assessment Lower Extremity Assessment: Defer to PT evaluation     Mobility Bed Mobility Overal bed mobility: Needs Assistance Bed Mobility: Supine to Sit Supine to sit: Min assist General bed mobility comments: (A) to lift shoulders/trunk to sitting upright.   Transfers Overall transfer level: Modified independent Equipment used: Bilateral platform walker Transfers: Sit to/from Stand Sit to Stand: Modified independent (Device/Increase time) Stand pivot transfers: Modified independent (Device/Increase time) General transfer comment: cues for hand placement.         Balance Balance Overall balance assessment: Modified Independent Standing balance support: Bilateral upper extremity supported Standing balance-Leahy Scale: Fair   End of Session OT - End of Session Equipment Utilized During Treatment: Rolling walker Activity Tolerance: Patient tolerated treatment well Patient left: in chair;with call bell/phone within reach;with family/visitor present Nurse Communication: Mobility status CPM Right Knee CPM Right Knee: Off     Cleto Claggett OTR/L 02/26/2013, 2:08 PM

## 2013-02-26 NOTE — Discharge Instructions (Signed)
Home Health PT to be provided by [facility]  [phone]

## 2013-02-26 NOTE — Progress Notes (Signed)
Physical Therapy Treatment Patient Details Name: [name] MRN: [id number] DOB: 01-05-1951 Today's Date: 02/26/2013 Time: 4270-6237 PT Time Calculation (min): 29 min  PT Assessment / Plan / Recommendation  History of Present Illness Patient is a 63 yo female s/p Rt TKA.  Patient with h/o CVA, RA, HTN.   PT Comments   Pt making great progress with mobility.  Performs LE exercises with cues only & performs sit<>stand transfers + ambulation at min guard level.     Follow Up Recommendations  Home health PT;Supervision/Assistance - 24 hour     Does the patient have the potential to tolerate intense rehabilitation     Barriers to Discharge        Equipment Recommendations  None recommended by PT    Recommendations for Other Services    Frequency 7X/week   Progress towards PT Goals Progress towards PT goals: Progressing toward goals  Plan Current plan remains appropriate    Precautions / Restrictions Precautions Precautions: Fall;Knee Precaution Comments: Reviewed precautions with patient. Required Braces or Orthoses: Knee Immobilizer - Right Knee Immobilizer - Right: On except when in CPM;Discontinue once straight leg raise with < 10 degree lag Restrictions Weight Bearing Restrictions: Yes RLE Weight Bearing: Weight bearing as tolerated Other Position/Activity Restrictions: RA in wrists - needs bil. platform RW   Pertinent Vitals/Pain "I'm ok" in response when asking about pain.      Mobility  Bed Mobility Overal bed mobility: Needs Assistance Bed Mobility: Supine to Sit Supine to sit: Min assist General bed mobility comments: (A) to lift shoulders/trunk to sitting upright.   Transfers Overall transfer level: Needs assistance Equipment used: Bilateral platform walker Transfers: Sit to/from Stand Sit to Stand: Min guard General transfer comment: cues for hand placement.  Ambulation/Gait Ambulation/Gait assistance: Min guard Ambulation Distance (Feet): 100  Feet Assistive device: Bilateral platform walker Gait Pattern/deviations: Step-through pattern;Decreased stride length;Decreased step length - left General Gait Details: cues for sequencing initially.  Emerging into step-through gait pattern as distance increased.  Trialed ambulation without KI due to pt able to perform SLR's without extension lag.  No knee buckling noted.      Exercises Total Joint Exercises Ankle Circles/Pumps: AROM;Both;10 reps Quad Sets: AROM;Strengthening;Both;10 reps Heel Slides: AROM;Strengthening;Right;10 reps Hip ABduction/ADduction: AROM;Strengthening;Right;10 reps Straight Leg Raises: AROM;Strengthening;Right;10 reps Long Arc Quad: AROM;Strengthening;Right;10 reps     PT Goals (current goals can now be found in the care plan section) Acute Rehab PT Goals PT Goal Formulation: With patient Time For Goal Achievement: 03/04/13 Potential to Achieve Goals: Good  Visit Information  Last PT Received On: 02/26/13 Assistance Needed: +1 History of Present Illness: Patient is a 63 yo female s/p Rt TKA.  Patient with h/o CVA, RA, HTN.    Subjective Data      Cognition  Cognition Arousal/Alertness: Awake/alert Behavior During Therapy: WFL for tasks assessed/performed Overall Cognitive Status: Within Functional Limits for tasks assessed    Balance     End of Session PT - End of Session Activity Tolerance: Patient tolerated treatment well Patient left: in chair;with call bell/phone within reach;with family/visitor present Nurse Communication: Mobility status   GP     Sena Hitch 02/26/2013, 11:42 AM   Sarajane Marek, PTA [phone] 02/26/2013

## 2013-02-26 NOTE — Care Management Note (Signed)
CARE MANAGEMENT NOTE 02/26/2013  Patient:  [name], [name]   Account Number:  [id number]  Date Initiated:  02/26/2013  Documentation initiated by:  Ricki Miller  Subjective/Objective Assessment:   63 yr old female s/p right total knee arthroplasty.     Action/Plan:   CM spoke with patient concerning home health and DME needs at discharge.patient preoperatively setup with [facility], no changes. Has platform walker, 3in1 and CPM have been delivered to the home.Has family support at discharge.   Anticipated DC Date:  02/27/2013   Anticipated DC Plan:  [place]  CM consult      PAC Choice  [place]   Choice offered to / List presented to:  C-1 Patient      DME agency  [employer]     [place] arranged  HH-2 PT      [place].   Status of service:  Completed, signed off Medicare Important Message given?   (If response is "NO", the following Medicare IM given date fields will be blank) Date Medicare IM given:   Date Additional Medicare IM given:    Discharge Disposition:  [place]

## 2013-02-26 NOTE — Progress Notes (Signed)
Physical Therapy Treatment Patient Details Name: [name] MRN: [id number] DOB: [birth year] Today's Date: 02/26/2013 Time: 0263-7858 PT Time Calculation (min): 23 min  PT Assessment / Plan / Recommendation  History of Present Illness Patient is a 63 yo female s/p Rt TKA.  Patient with h/o CVA, RA, HTN.   PT Comments   Progressing well. Attempt steps next visit  Follow Up Recommendations  Home health PT;Supervision/Assistance - 24 hour     Does the patient have the potential to tolerate intense rehabilitation     Barriers to Discharge        Equipment Recommendations  None recommended by PT    Recommendations for Other Services    Frequency 7X/week   Progress towards PT Goals Progress towards PT goals: Progressing toward goals  Plan Current plan remains appropriate    Precautions / Restrictions Precautions Precautions: Fall;Knee Precaution Comments: Reviewed precautions with patient. Required Braces or Orthoses: Knee Immobilizer - Right Knee Immobilizer - Right: On except when in CPM;Discontinue once straight leg raise with < 10 degree lag Restrictions Weight Bearing Restrictions: No RLE Weight Bearing: Weight bearing as tolerated Other Position/Activity Restrictions: RA in wrists - needs bil. platform RW   Pertinent Vitals/Pain no apparent distress     Mobility  Bed Mobility Overal bed mobility: Needs Assistance Bed Mobility: Supine to Sit;Sit to Supine Supine to sit: Min assist Sit to supine: Modified independent (Device/Increase time) General bed mobility comments: (A) to lift shoulders/trunk to sitting upright.   Transfers Overall transfer level: Modified independent Equipment used: Bilateral platform walker Transfers: Sit to/from Stand Sit to Stand: Modified independent (Device/Increase time) Stand pivot transfers: Modified independent (Device/Increase time) General transfer comment: cues for hand placement.  Ambulation/Gait Ambulation/Gait assistance:  Supervision Ambulation Distance (Feet): 200 Feet Assistive device: Bilateral platform walker Gait Pattern/deviations: Step-through pattern;Decreased stride length General Gait Details: Cues for posture and to increase weight through LEs    Exercises Total Joint Exercises Ankle Circles/Pumps: AROM;Both;10 reps Quad Sets: AROM;Strengthening;Both;10 reps Heel Slides: AROM;Strengthening;Right;10 reps Hip ABduction/ADduction: AROM;Strengthening;Right;10 reps Straight Leg Raises: AROM;Strengthening;Right;10 reps Long Arc Quad: AROM;Strengthening;Right;10 reps   PT Diagnosis:    PT Problem List:   PT Treatment Interventions:     PT Goals (current goals can now be found in the care plan section) Acute Rehab PT Goals PT Goal Formulation: With patient Time For Goal Achievement: 03/04/13 Potential to Achieve Goals: Good  Visit Information  Last PT Received On: 02/26/13 Assistance Needed: +1 History of Present Illness: Patient is a 63 yo female s/p Rt TKA.  Patient with h/o CVA, RA, HTN.    Subjective Data      Cognition  Cognition Arousal/Alertness: Awake/alert Behavior During Therapy: WFL for tasks assessed/performed Overall Cognitive Status: Within Functional Limits for tasks assessed    Balance  Balance Overall balance assessment: Modified Independent Standing balance support: Bilateral upper extremity supported Standing balance-Leahy Scale: Fair  End of Session PT - End of Session Activity Tolerance: Patient tolerated treatment well Patient left: with call bell/phone within reach;with family/visitor present;in bed;with nursing/sitter in room Nurse Communication: Mobility status CPM Right Knee CPM Right Knee: Off   GP     Jacqualyn Posey 02/26/2013, 2:22 PM  02/26/2013 Jacqualyn Posey PTA [phone] pager [phone] office

## 2013-02-26 NOTE — Progress Notes (Signed)
Subjective: 1 Day Post-Op Procedure(s) (LRB): TOTAL KNEE ARTHROPLASTY (Right) Patient reports pain as 5 on 0-10 scale.    Objective: Vital signs in last 24 hours: Temp:  [97.6 F (36.4 C)-98.7 F (37.1 C)] 98.7 F (37.1 C) (01/13 0619) Pulse Rate:  [77-87] 80 (01/13 0619) Resp:  [16-24] 18 (01/13 0619) BP: (158-198)/(66-78) 158/76 mmHg (01/13 0619) SpO2:  [89 %-100 %] 99 % (01/13 0619)  Intake/Output from previous day: 01/12 0701 - 01/13 0700 In: 1223.3 [P.O.:240; I.V.:983.3] Out: 2175 [Urine:1900; Drains:225; Blood:50] Intake/Output this shift:     Recent Labs  02/26/13 0425  HGB 7.9*    Recent Labs  02/26/13 0425  WBC 19.6*  RBC 3.20*  HCT 25.6*  PLT 264    Recent Labs  02/26/13 0425  NA 143  K 4.4  CL 105  CO2 25  BUN 11  CREATININE 0.84  GLUCOSE 147*  CALCIUM 8.8   No results found for this basename: LABPT, INR,  in the last 72 hours  ABD soft Neurovascular intact Sensation intact distally Intact pulses distally Dorsiflexion/Plantar flexion intact Incision: dressing C/D/I  Assessment/Plan: 1 Day Post-Op Procedure(s) (LRB): TOTAL KNEE ARTHROPLASTY (Right) Principal Problem:   Right knee DJD Active Problems:   RA (rheumatoid arthritis)   High cholesterol   Hypertension   GERD (gastroesophageal reflux disease)   Asthenia   Cerebral aneurysm, nonruptured   TIA (transient ischemic attack)   PONV (postoperative nausea and vomiting)   Stroke   Hypercholesterolemia   Cancer   Colon polyps   Arthritis   Rheumatoid arthritis with rheumatoid factor   DJD (degenerative joint disease) of knee   Postoperative anemia due to acute blood loss   Anemia, unspecified  Advance diet Up with therapy Plan for discharge tomorrow Patient has symptomatic post operative blood loss anemia.  Will transfuse 2 units of PRBC's with 20mg  of lasix between units.  Will follow elevated WBC's  Expect elevation due to immusupressant medications used to treat her  rheumatoid.  [name] J 02/26/2013, 7:37 AM

## 2013-02-27 LAB — BASIC METABOLIC PANEL
BUN: 18 mg/dL (ref 6–23)
CALCIUM: 8.7 mg/dL (ref 8.4–10.5)
CO2: 26 mEq/L (ref 19–32)
CREATININE: 0.75 mg/dL (ref 0.50–1.10)
Chloride: 106 mEq/L (ref 96–112)
GFR calc non Af Amer: 88 mL/min — ABNORMAL LOW (ref >90)
Glucose, Bld: 112 mg/dL — ABNORMAL HIGH (ref 70–99)
Potassium: 4.5 mEq/L (ref 3.7–5.3)
Sodium: 140 mEq/L (ref 137–147)

## 2013-02-27 LAB — TYPE AND SCREEN
ABO/RH(D): B NEG
ANTIBODY SCREEN: NEGATIVE
UNIT DIVISION: 0
Unit division: 0

## 2013-02-27 LAB — CBC
HEMATOCRIT: 29.3 % — AB (ref 36.0–46.0)
Hemoglobin: 9.1 g/dL — ABNORMAL LOW (ref 12.0–15.0)
MCH: 25.7 pg — AB (ref 26.0–34.0)
MCHC: 31.1 g/dL (ref 30.0–36.0)
MCV: 82.8 fL (ref 78.0–100.0)
PLATELETS: 246 10*3/uL (ref 150–400)
RBC: 3.54 MIL/uL — ABNORMAL LOW (ref 3.87–5.11)
RDW: 16.4 % — AB (ref 11.5–15.5)
WBC: 19.3 10*3/uL — ABNORMAL HIGH (ref 4.0–10.5)

## 2013-02-27 MED ORDER — RIVAROXABAN 10 MG PO TABS: 10.0000 mg | ORAL_TABLET | Freq: Every day | ORAL | Status: DC

## 2013-02-27 MED ORDER — BISACODYL 5 MG PO TBEC: DELAYED_RELEASE_TABLET | ORAL | Status: DC

## 2013-02-27 MED ORDER — HYDROCODONE-ACETAMINOPHEN 10-325 MG PO TABS: ORAL_TABLET | ORAL | Status: DC

## 2013-02-27 MED ORDER — DSS 100 MG PO CAPS: ORAL_CAPSULE | ORAL | Status: DC

## 2013-02-27 NOTE — Progress Notes (Signed)
Physical Therapy Treatment Patient Details Name: [name] MRN: [id number] DOB: 1950-07-28 Today's Date: 02/27/2013 Time: 1517-6160 PT Time Calculation (min): 28 min  PT Assessment / Plan / Recommendation  History of Present Illness Patient is a 63 yo female s/p Rt TKA.  Patient with h/o CVA, RA, HTN.   PT Comments   Pt agreeable to participate in therapy despite pain being severe.  Pt able to complete stair training but ambulation distance limited due to pain.     Follow Up Recommendations  Home health PT;Supervision/Assistance - 24 hour     Does the patient have the potential to tolerate intense rehabilitation     Barriers to Discharge        Equipment Recommendations  None recommended by PT    Recommendations for Other Services    Frequency 7X/week   Progress towards PT Goals Progress towards PT goals: Progressing toward goals  Plan Current plan remains appropriate    Precautions / Restrictions Precautions Precautions: Fall;Knee Precaution Comments: Reviewed precautions with patient. Knee Immobilizer - Right: On except when in CPM;Discontinue once straight leg raise with < 10 degree lag Restrictions RLE Weight Bearing: Weight bearing as tolerated   Pertinent Vitals/Pain "11/10".  Premedicated.  Repositioned for comfort.  Ice applied.      Mobility  Bed Mobility Overal bed mobility: Modified Independent Bed Mobility: Sit to Supine General bed mobility comments: No physical (A) needed.   Transfers Overall transfer level: Modified independent Equipment used: Bilateral platform walker Transfers: Sit to/from [employer] Transfers Sit to Stand: Modified independent (Device/Increase time) Stand pivot transfers: Modified independent (Device/Increase time) General transfer comment: Pt demonstrated safe technique but required increased time due to more painful RLE today.   Ambulation/Gait Ambulation/Gait assistance: Supervision Ambulation Distance (Feet): 80  Feet Assistive device: Bilateral platform walker Gait Pattern/deviations: Step-through pattern Gait velocity: decreased (due to pain) Gait velocity interpretation: Below normal speed for age/gender General Gait Details: Pt demonstrates safe use of walker & technique but distance limited due to RLE more painful today compared to yesterday.   Stairs: Yes Stairs assistance: Min guard Stair Management: One rail Right;Sideways Number of Stairs: 3 General stair comments: cues for sequencing & technqiue.        PT Goals (current goals can now be found in the care plan section) Acute Rehab PT Goals PT Goal Formulation: With patient Time For Goal Achievement: 03/04/13 Potential to Achieve Goals: Good  Visit Information  Last PT Received On: 02/27/13 Assistance Needed: +1 History of Present Illness: Patient is a 63 yo female s/p Rt TKA.  Patient with h/o CVA, RA, HTN.    Subjective Data      Cognition  Cognition Arousal/Alertness: Awake/alert Behavior During Therapy: WFL for tasks assessed/performed Overall Cognitive Status: Within Functional Limits for tasks assessed    Balance     End of Session PT - End of Session Activity Tolerance: Patient tolerated treatment well Patient left: in bed;with call bell/phone within reach Nurse Communication: Mobility status   GP     Sena Hitch 02/27/2013, 9:41 AM  Sarajane Marek, PTA [phone] 02/27/2013

## 2013-02-27 NOTE — Discharge Summary (Signed)
Patient ID: [name] MRN: [id number] DOB/AGE: [birth year] 63 y.o.  Admit date: 02/25/2013 Discharge date: 02/27/2013  Admission Diagnoses:  Principal Problem:   Right knee DJD Active Problems:   RA (rheumatoid arthritis)   High cholesterol   Hypertension   GERD (gastroesophageal reflux disease)   Asthenia   Cerebral aneurysm, nonruptured   TIA (transient ischemic attack)   PONV (postoperative nausea and vomiting)   Stroke   Hypercholesterolemia   Cancer   Colon polyps   Arthritis   Rheumatoid arthritis with rheumatoid factor   DJD (degenerative joint disease) of knee   Postoperative anemia due to acute blood loss   Anemia, unspecified   Discharge Diagnoses:  Same  Past Medical History  Diagnosis Date  . PONV (postoperative nausea and vomiting)   . Hypertension   . Angina   . Hypercholesterolemia   . Numbness and tingling in right hand   . GERD (gastroesophageal reflux disease)   . Constipation   . Diarrhea   . Colon polyps   . Arthritis     Rheumatoid  . Cancer     left breast, cervical cancer  . Rheumatoid arthritis with rheumatoid factor   . Right knee DJD   . Heart murmur   . Stroke     jan 2012  . Peripheral vascular disease     cerebrel aneurysm /sp coil  . Anemia     Surgeries: Procedure(s): TOTAL KNEE ARTHROPLASTY on 02/25/2013   Consultants:    Discharged Condition: Improved  Hospital Course: [name] is an 63 y.o. female who was admitted 02/25/2013 for operative treatment ofRight knee DJD. Patient has severe unremitting pain that affects sleep, daily activities, and work/hobbies. After pre-op clearance the patient was taken to the operating room on 02/25/2013 and underwent  Procedure(s): TOTAL KNEE ARTHROPLASTY.    Patient was given perioperative antibiotics: Anti-infectives   Start     Dose/Rate Route Frequency Ordered Stop   02/25/13 1330  ceFAZolin (ANCEF) IVPB 2 g/50 mL premix     2 g 100 mL/hr over 30 Minutes Intravenous  Every 6 hours 02/25/13 1206 02/25/13 2159   02/25/13 0700  ceFAZolin (ANCEF) IVPB 2 g/50 mL premix     2 g 100 mL/hr over 30 Minutes Intravenous On call to O.R. 02/25/13 0646 02/25/13 0730   02/25/13 0648  ceFAZolin (ANCEF) 2-3 GM-% IVPB SOLR    Comments:  [name]   : cabinet override      02/25/13 0648 02/25/13 0700       Patient was given sequential compression devices, early ambulation, and chemoprophylaxis to prevent DVT.  Patient has anemia of chronic disease.  This along with post op blood loss anemia made her symptomatic.  She received 2 units of packed red cells on post op day one.  Post op day 2  Hgb was 9.1   The patient was much improved.  Patient benefited maximally from hospital stay and there were no complications.    Recent vital signs: Patient Vitals for the past 24 hrs:  BP Temp Temp src Pulse Resp SpO2  02/27/13 0428 145/70 mmHg 98.4 F (36.9 C) Oral 76 16 94 %  02/26/13 2056 175/63 mmHg 98.3 F (36.8 C) Oral 90 20 93 %  02/26/13 1558 - - - - 18 98 %  02/26/13 1415 146/63 mmHg 98.3 F (36.8 C) Oral 83 18 96 %  02/26/13 1315 141/92 mmHg 98.5 F (36.9 C) Oral 80 18 98 %  02/26/13 1200 150/58  mmHg 98.4 F (36.9 C) Oral 80 18 96 %  02/26/13 1134 - - - - 16 98 %  02/26/13 1100 158/56 mmHg 97.6 F (36.4 C) Oral 77 18 98 %     Recent laboratory studies:  Recent Labs  02/26/13 0425 02/27/13 0542  WBC 19.6* 19.3*  HGB 7.9* 9.1*  HCT 25.6* 29.3*  PLT 264 246  NA 143 140  K 4.4 4.5  CL 105 106  CO2 25 26  BUN 11 18  CREATININE 0.84 0.75  GLUCOSE 147* 112*  CALCIUM 8.8 8.7     Discharge Medications:     Medication List    STOP taking these medications       aspirin EC 325 MG tablet     HYDROcodone-acetaminophen 5-325 MG per tablet  Commonly known as:  NORCO/VICODIN  Replaced by:  HYDROcodone-acetaminophen 10-325 MG per tablet     hydroxychloroquine 200 MG tablet  Commonly known as:  PLAQUENIL     traMADol 50 MG tablet  Commonly known  as:  ULTRAM     XELJANZ 5 MG Tabs  Generic drug:  Tofacitinib Citrate      TAKE these medications       amitriptyline 50 MG tablet  Commonly known as:  ELAVIL  Take 50 mg by mouth at bedtime.     BENICAR 40 MG tablet  Generic drug:  olmesartan  Take 40 mg by mouth daily.     bisacodyl 5 MG EC tablet  Commonly known as:  DULCOLAX  Take 2 tablets every night with dinner until bowel movement.  LAXITIVE.  Restart if two days since last bowel movement     BYSTOLIC 20 MG Tabs  Generic drug:  Nebivolol HCl  Take 1 tablet by mouth daily.     CALTRATE 600+D PLUS 600-400 MG-UNIT per tablet  Chew 1 tablet by mouth 2 (two) times daily.     Co Q-10 200 MG Caps  Take 2 capsules by mouth daily.     DSS 100 MG Caps  1 tab 2 times a day while on narcotics.  STOOL SOFTENER     furosemide 40 MG tablet  Commonly known as:  LASIX  Take 40 mg by mouth daily.     HYDROcodone-acetaminophen 10-325 MG per tablet  Commonly known as:  NORCO  1-2 tablets every 4-6 hrs as needed for pain     lansoprazole 30 MG capsule  Commonly known as:  PREVACID  Take 30 mg by mouth daily at 12 noon.     magnesium oxide 400 MG tablet  Commonly known as:  MAG-OX  Take 400 mg by mouth daily.     meloxicam 15 MG tablet  Commonly known as:  MOBIC  Take 15 mg by mouth daily.     methylPREDNISolone 4 MG tablet  Commonly known as:  MEDROL  Take 4 mg by mouth daily.     pravastatin 40 MG tablet  Commonly known as:  PRAVACHOL  Take 80 mg by mouth at bedtime.     rivaroxaban 10 MG Tabs tablet  Commonly known as:  XARELTO  Take 1 tablet (10 mg total) by mouth daily with breakfast.     sucralfate 1 G tablet  Commonly known as:  CARAFATE  Take 1 g by mouth daily.     SYSTANE 0.4-0.3 % Gel  Generic drug:  Polyethyl Glycol-Propyl Glycol  Apply 1 application to eye daily as needed. For dry eye relief     Vitamin D  2000 UNITS tablet  Take 2,000 Units by mouth daily.        Diagnostic Studies: No  results found.  Disposition: 01-Home or Self Care      Discharge Orders   Future Appointments Provider Department Dept Phone   05/27/2013 2:00 PM Rogene Houston, MD [place] [phone]   Future Orders Complete By Expires   Call MD / Call 911  As directed    Comments:     If you experience chest pain or shortness of breath, CALL 911 and be transported to the hospital emergency room.  If you develope a fever above 101 F, pus (white drainage) or increased drainage or redness at the wound, or calf pain, call your surgeon's office.   Change dressing  As directed    Comments:     Change the dressing daily with sterile 4 x 4 inch gauze dressing and apply TED hose.  You may clean the incision with alcohol prior to redressing.   Constipation Prevention  As directed    Comments:     Drink plenty of fluids.  Prune juice may be helpful.  You may use a stool softener, such as Colace (over the counter) 100 mg twice a day.  Use MiraLax (over the counter) for constipation as needed.   CPM  As directed    Comments:     Continuous passive motion machine (CPM):      Use the CPM from 0 to 90 for 6 hours per day.       You may break it up into 2 or 3 sessions per day.      Use CPM for 2 weeks or until you are told to stop.   Diet - low sodium heart healthy  As directed    Discharge instructions  As directed    Comments:     Total Knee Replacement Care After Refer to this sheet in the next few weeks. These discharge instructions provide you with general information on caring for yourself after you leave the hospital. Your caregiver may also give you specific instructions. Your treatment has been planned according to the most current medical practices available, but unavoidable complications sometimes occur. If you have any problems or questions after discharge, please call your caregiver. Regaining a near full range of motion of your knee within the first 3 to 6 weeks after surgery  is critical. [place] may resume a normal diet and activities as directed.  Perform exercises as directed.  Place gray foam block, curve side up under heel at all times except when in CPM or when walking.  DO NOT modify, tear, cut, or change in any way the gray foam block. You will receive physical therapy daily  Take showers instead of baths until informed otherwise.  You may shower on Sunday.  Please wash whole leg including wound with soap and water  Change bandages (dressings)daily It is OK to take over-the-counter tylenol in addition to the oxycodone for pain, discomfort, or fever. Oxycodone is VERY constipating.  Please take stool softener twice a day and laxatives daily until bowels are regular Eat a well-balanced diet.  Avoid lifting or driving until you are instructed otherwise.  Make an appointment to see your caregiver for stitches (suture) or staple removal as directed.  If you have been sent home with a continuous passive motion machine (CPM machine), 0-90 degrees 6 hrs a day   2 hrs a shift [place]  CARE IF: You have swelling of your calf or leg.  You develop shortness of breath or chest pain.  You have redness, swelling, or increasing pain in the wound.  There is pus or any unusual drainage coming from the surgical site.  You notice a bad smell coming from the surgical site or dressing.  The surgical site breaks open after sutures or staples have been removed.  There is persistent bleeding from the suture or staple line.  You are getting worse or are not improving.  You have any other questions or concerns.  SEEK IMMEDIATE MEDICAL CARE IF:  You have a fever.  You develop a rash.  You have difficulty breathing.  You develop any reaction or side effects to medicines given.  Your knee motion is decreasing rather than improving.  MAKE SURE YOU:  Understand these instructions.  Will watch your condition.  Will get help right away if you are not doing  well or get worse.   Do not put a pillow under the knee. Place it under the heel.  As directed    Comments:     Place gray foam block, curve side up under heel at all times except when in CPM or when walking.  DO NOT modify, tear, cut, or change in any way the gray foam block.   Increase activity slowly as tolerated  As directed    TED hose  As directed    Comments:     Use stockings (TED hose) for 2 weeks on both leg(s).  You may remove them at night for sleeping.      Follow-up Information   Follow up with Lorn Junes, MD On 03/11/2013. (appt time 3:45)    Specialty:  Orthopedic Surgery   Contact information:   [address] [place] [address] [phone]        Signed: Linda Hedges 02/27/2013, 10:26 AM

## 2013-03-01 ENCOUNTER — Encounter (HOSPITAL_COMMUNITY): Payer: Self-pay | Admitting: Orthopedic Surgery

## 2013-03-06 ENCOUNTER — Other Ambulatory Visit (INDEPENDENT_AMBULATORY_CARE_PROVIDER_SITE_OTHER): Payer: Self-pay | Admitting: Internal Medicine

## 2013-03-14 ENCOUNTER — Ambulatory Visit (HOSPITAL_COMMUNITY)
Admission: RE | Admit: 2013-03-14 | Discharge: 2013-03-14 | Disposition: A | Discharge status: Home / Self Care | Payer: 59 | Source: Ambulatory Visit | Attending: Orthopedic Surgery | Admitting: Orthopedic Surgery

## 2013-03-14 DIAGNOSIS — IMO0001 Reserved for inherently not codable concepts without codable children: Secondary | ICD-10-CM | POA: Insufficient documentation

## 2013-03-14 DIAGNOSIS — M25669 Stiffness of unspecified knee, not elsewhere classified: Secondary | ICD-10-CM | POA: Insufficient documentation

## 2013-03-14 DIAGNOSIS — M25569 Pain in unspecified knee: Secondary | ICD-10-CM | POA: Insufficient documentation

## 2013-03-14 DIAGNOSIS — E7800 Pure hypercholesterolemia, unspecified: Secondary | ICD-10-CM | POA: Insufficient documentation

## 2013-03-14 DIAGNOSIS — R29898 Other symptoms and signs involving the musculoskeletal system: Secondary | ICD-10-CM | POA: Insufficient documentation

## 2013-03-14 DIAGNOSIS — I10 Essential (primary) hypertension: Secondary | ICD-10-CM | POA: Insufficient documentation

## 2013-03-14 DIAGNOSIS — R262 Difficulty in walking, not elsewhere classified: Secondary | ICD-10-CM | POA: Insufficient documentation

## 2013-03-14 NOTE — Evaluation (Addendum)
Physical Therapy Evaluation  Patient Details  Name: [name] MRN: [id number] Date of Birth: 06-Dec-1950  Today's Date: 03/14/2013 Time: 0930-1020 PT Time Calculation (min): 50 min Charge:  Evaluation [phone]; manual 34             Visit#: 1 of 8  Re-eval:   Assessment Diagnosis: Rt TKR Surgical Date: 02/26/12 Next MD Visit: 04/01/2013  Authorization: [facility]    Past Medical History:  Past Medical History  Diagnosis Date  . PONV (postoperative nausea and vomiting)   . Hypertension   . Angina   . Hypercholesterolemia   . Numbness and tingling in right hand   . GERD (gastroesophageal reflux disease)   . Constipation   . Diarrhea   . Colon polyps   . Arthritis     Rheumatoid  . Cancer     left breast, cervical cancer  . Rheumatoid arthritis with rheumatoid factor   . Right knee DJD   . Heart murmur   . Stroke     jan 2012  . Peripheral vascular disease     cerebrel aneurysm /sp coil  . Anemia    Past Surgical History:  Past Surgical History  Procedure Laterality Date  . Right brain aneurysm  04/2010    Coiled   . Abdominal hysterectomy  1977  . Cholecystectomy    . Bilateral oophorectomy    . Left knee arthroscopy    . Right wrist surgery  1999    synevectomy  . Left breast lumpectomy  2004  . Appendectomy  1964  . Colonoscopy  12/09/2010    Procedure: COLONOSCOPY;  Surgeon: Rogene Houston, MD;  Location: [facility];  Service: Endoscopy;  Laterality: N/A;  . Total knee arthroplasty Right 02/25/2013    Procedure: TOTAL KNEE ARTHROPLASTY;  Surgeon: Lorn Junes, MD;  Location: [facility];  Service: Orthopedics;  Laterality: Right;    Subjective Symptoms/Limitations Symptoms: [name] states that she had her TKR on 1/12 and was discharged on 1/15 to home health.  She has been recieving HH until 03/12/2013 and is now being referred to PT to maximize her functional activity,.  Her main concern is her pain at this time.  The pt states that her MD assistant  stated not to use ice on her knee.   Pertinent History: RA affecting all jts pt states due to this she will not be able to squat or take steps reciprocal  How long can you sit comfortably?: Able to sit for five minutes How long can you stand comfortably?: increases pain immediately. How long can you walk comfortably?: pt is no longer using an assistive device the longest she has walked has been ten minutes but she does have incresed pain. Pain Assessment Currently in Pain?: Yes Pain Score: 5  (worst is a 10/10; best 5/10) Pain Location: Knee Pain Orientation: Right Pain Type: Surgical pain Pain Frequency: Constant    Balance Screening Balance Screen Has the patient fallen in the past 6 months: No   Sensation/Coordination/Flexibility/Functional Tests Functional Tests Functional Tests: FS 52  Assessment RLE AROM (degrees) Right Knee Extension: 7 Right Knee Flexion: 120 RLE Strength Right Hip Flexion: 4/5 Right Hip Extension: 3/5 Right Hip ABduction: 4/5 Right Knee Flexion: 4/5 Right Knee Extension: 3+/5 Right Ankle Dorsiflexion: 3+/5  Exercise/Treatments     Seated Long Arc Quad: 5 reps Supine Quad Sets: 10 reps Straight Leg Raises: 5 reps Sidelying Hip ABduction: 5 reps Prone  Hamstring Curl: 5 reps Hip Extension:  5 reps   Manual Therapy Manual Therapy: Other (comment) Other Manual Therapy: decongestive manual techniques used to decrease swelling  Physical Therapy Assessment and Plan PT Assessment and Plan Clinical Impression Statement: Pt is a 63 yo female who has a hx of RA who recently had a Rt TKR.  Pt has decreased strength, balance and increased pain.  Pt will benefit from skilled therapy to address these issues and maximize pt quality of life. Pt will benefit from skilled therapeutic intervention in order to improve on the following deficits: Decreased activity tolerance;Decreased range of motion;Pain;Decreased strength;Difficulty walking Rehab  Potential: Good PT Frequency: Min 2X/week PT Duration: 4 weeks PT Treatment/Interventions: Gait training;Therapeutic activities;Therapeutic exercise;Balance training;Patient/family education PT Plan: Pt to be progressed through strengthening; manual to decrease pain and swelling    Goals Home Exercise Program Pt/caregiver will Perform Home Exercise Program: For increased strengthening PT Short Term Goals Time to Complete Short Term Goals: 2 weeks PT Short Term Goal 1: Pt to be able to sit for 15 minutes without increased pain to be able to enjoy a small meal PT Short Term Goal 2: Pt to be able to stand for 15 minutes without increased pain to make a small meal PT Short Term Goal 3: Pt to be able to walk for 15 minutes without increased pain to complete short shopping trips PT Short Term Goal 4: Pain to be no greater than a 6 PT Long Term Goals Time to Complete Long Term Goals: 4 weeks PT Long Term Goal 1: Pt to be I in advance HEP PT Long Term Goal 2: Pt to be able to sit for 45 minutes without increased pain to be able to go out to eat Long Term Goal 3: Pt to be able to stand for 30 minutes to make a meall Long Term Goal 4: Pt to be able to walk for 40 minutes to be able to shop.  Problem List Stiffness of Rt knee Decreased ROM Rt knee Weakness of Rt LE Difficulty walking  Patient Active Problem List   Diagnosis Date Noted  . Postoperative anemia due to acute blood loss 02/26/2013  . Anemia, unspecified 02/26/2013  . DJD (degenerative joint disease) of knee 02/25/2013  . PONV (postoperative nausea and vomiting)   . Stroke   . Hypercholesterolemia   . Cancer   . Colon polyps   . Arthritis   . Rheumatoid arthritis with rheumatoid factor   . Right knee DJD   . Asthenia 08/14/2012  . Cerebral aneurysm, nonruptured 08/14/2012  . TIA (transient ischemic attack) 08/14/2012  . RA (rheumatoid arthritis) 10/27/2011  . High cholesterol 10/27/2011  . Hypertension 10/27/2011  . GERD  (gastroesophageal reflux disease) 10/27/2011    PT - End of Session Equipment Utilized During Treatment: Gait belt Activity Tolerance: Patient tolerated treatment well PT Plan of Care PT Home Exercise Plan: given  GP Functional Limitation: Mobility: Walking and moving around  Josphine Laffey,CINDY 03/14/2013, 12:31 PM  Physician Documentation Your signature is required to indicate approval of the treatment plan as stated above.  Please sign and either send electronically or make a copy of this report for your files and return this physician signed original.   Please mark one 1.__approve of plan  2. ___approve of plan with the following conditions.   ______________________________  _____________________ Physician Signature                                                                                                             Date

## 2013-03-20 ENCOUNTER — Ambulatory Visit (HOSPITAL_COMMUNITY)
Admission: RE | Admit: 2013-03-20 | Discharge: 2013-03-20 | Disposition: A | Discharge status: Home / Self Care | Payer: 59 | Source: Ambulatory Visit | Attending: Internal Medicine | Admitting: Internal Medicine

## 2013-03-20 DIAGNOSIS — M25669 Stiffness of unspecified knee, not elsewhere classified: Secondary | ICD-10-CM | POA: Insufficient documentation

## 2013-03-20 DIAGNOSIS — I10 Essential (primary) hypertension: Secondary | ICD-10-CM | POA: Insufficient documentation

## 2013-03-20 DIAGNOSIS — IMO0001 Reserved for inherently not codable concepts without codable children: Secondary | ICD-10-CM | POA: Insufficient documentation

## 2013-03-20 DIAGNOSIS — M25569 Pain in unspecified knee: Secondary | ICD-10-CM | POA: Insufficient documentation

## 2013-03-20 DIAGNOSIS — E7800 Pure hypercholesterolemia, unspecified: Secondary | ICD-10-CM | POA: Insufficient documentation

## 2013-03-20 DIAGNOSIS — R29898 Other symptoms and signs involving the musculoskeletal system: Secondary | ICD-10-CM | POA: Insufficient documentation

## 2013-03-20 DIAGNOSIS — R262 Difficulty in walking, not elsewhere classified: Secondary | ICD-10-CM | POA: Insufficient documentation

## 2013-03-20 NOTE — Progress Notes (Signed)
Physical Therapy Treatment Patient Details  Name: [name] MRN: [id number] Date of Birth: [birth year]  Today's Date: 03/20/2013 Time: 3428-7681 PT Time Calculation (min): 40 min Charge there  Ex 1435-1500; ;manual [phone] Visit#: 2 of 8     Authorization: [employer]   Subjective: Symptoms/Limitations Symptoms: Pt states that she woke up with her knee hurting.   Pain Assessment Pain Score: 5  Pain Location: Knee Pain Orientation: Right Pain Type: Surgical pain     Exercise/Treatments     Standing Knee Flexion: Strengthening;Right;10 reps;Limitations Knee Flexion Limitations: 3# Terminal Knee Extension: Right;10 reps;Theraband Theraband Level (Terminal Knee Extension): Level 4 (Blue) Lateral Step Up: 10 reps Forward Step Up: 10 reps Rocker Board: 2 minutes   Supine Quad Sets: 10 reps Terminal Knee Extension: 10 reps   Manual Therapy Manual Therapy: Other (comment) Other Manual Therapy: decongestive manual techniques used to decrease swelling  Physical Therapy Assessment and Plan PT Assessment and Plan Clinical Impression Statement: Pt with increased swelling and pain today; manual techniques decreased pt pain level to a 2. PT Plan: Pt to be progressed through strengthening; manual to decrease pain and swelling as well as addressing balance     Problem List Patient Active Problem List   Diagnosis Date Noted  . Postoperative anemia due to acute blood loss 02/26/2013  . Anemia, unspecified 02/26/2013  . DJD (degenerative joint disease) of knee 02/25/2013  . PONV (postoperative nausea and vomiting)   . Stroke   . Hypercholesterolemia   . Cancer   . Colon polyps   . Arthritis   . Rheumatoid arthritis with rheumatoid factor   . Right knee DJD   . Asthenia 08/14/2012  . Cerebral aneurysm, nonruptured 08/14/2012  . TIA (transient ischemic attack) 08/14/2012  . RA (rheumatoid arthritis) 10/27/2011  . High cholesterol 10/27/2011  . Hypertension 10/27/2011  .  GERD (gastroesophageal reflux disease) 10/27/2011    PT - End of Session Equipment Utilized During Treatment: Gait belt Activity Tolerance: Patient tolerated treatment well  GP    RUSSELL,CINDY 03/20/2013, 3:31 PM

## 2013-03-22 ENCOUNTER — Ambulatory Visit (HOSPITAL_COMMUNITY)
Admission: RE | Admit: 2013-03-22 | Discharge: 2013-03-22 | Disposition: A | Discharge status: Home / Self Care | Payer: 59 | Source: Ambulatory Visit | Attending: Internal Medicine | Admitting: Internal Medicine

## 2013-03-22 NOTE — Progress Notes (Addendum)
Physical Therapy Treatment Patient Details  Name: [name] MRN: [id number] Date of Birth: 1950/03/28  Today's Date: 03/22/2013 Time: 3435-6861 PT Time Calculation (min): 42 min There ex 1433-1500; manual [id number] Visit#: 3 of 8   Authorization: [employer]   Subjective: Symptoms/Limitations Symptoms: Pt states that she is doing all of her normal activities now they are just being done a little slower.  Pain Assessment Currently in Pain?: No/denies   Exercise/Treatments Stretches [place] Stretch:  (slant board x 30") Aerobic Stationary Bike: nustep L 4 x 8:00 Standing Heel Raises: 10 reps Knee Flexion: Strengthening;Right;10 reps;Limitations Knee Flexion Limitations: 4# Lateral Step Up: 10 reps;Hand Hold: 0 Forward Step Up: 10 reps;Hand Hold: 0 Functional Squat: 10 reps Rocker Board: 2 minutes SLS:  (x 5 rep max 10" hold)   Supine Quad Sets: 10 reps Terminal Knee Extension: 10 reps   Manual Therapy Other Manual Therapy: decongestive manual techniques used to decrease swelling  Physical Therapy Assessment and Plan PT Assessment and Plan Clinical Impression Statement: Pt pain decreased today; able to complete standiing exercises without hands facilitated by therapist. PT Plan: work on balance, strengthening and pain    Goals  progressing  Problem List Patient Active Problem List   Diagnosis Date Noted  . Postoperative anemia due to acute blood loss 02/26/2013  . Anemia, unspecified 02/26/2013  . DJD (degenerative joint disease) of knee 02/25/2013  . PONV (postoperative nausea and vomiting)   . Stroke   . Hypercholesterolemia   . Cancer   . Colon polyps   . Arthritis   . Rheumatoid arthritis with rheumatoid factor   . Right knee DJD   . Asthenia 08/14/2012  . Cerebral aneurysm, nonruptured 08/14/2012  . TIA (transient ischemic attack) 08/14/2012  . RA (rheumatoid arthritis) 10/27/2011  . High cholesterol 10/27/2011  . Hypertension 10/27/2011  . GERD  (gastroesophageal reflux disease) 10/27/2011       GP    Jovanka Westgate,CINDY 03/22/2013, 4:18 PM

## 2013-03-26 ENCOUNTER — Ambulatory Visit (HOSPITAL_COMMUNITY): Payer: 59 | Admitting: *Deleted

## 2013-03-28 ENCOUNTER — Ambulatory Visit (HOSPITAL_COMMUNITY)
Admission: RE | Admit: 2013-03-28 | Discharge: 2013-03-28 | Disposition: A | Discharge status: Home / Self Care | Payer: 59 | Source: Ambulatory Visit | Attending: Internal Medicine | Admitting: Internal Medicine

## 2013-03-28 NOTE — Progress Notes (Signed)
Physical Therapy Treatment Patient Details  Name: [name] MRN: [id number] Date of Birth: 08-28-1950  Today's Date: 03/28/2013 Time: 8099-8338 PT Time Calculation (min): 74 min Charge :TE 2505-3976, Manual 7341-9379  Visit#: 4 of 8  Re-eval: 04/11/13 Assessment Diagnosis: Rt TKR Surgical Date: 02/26/12 Next MD Visit: 04/01/2013  Subjective: Symptoms/Limitations Symptoms: Pt reported last night was the best sleep she has had since surgery, current Rt LE pain scale 2/10 Pain Assessment Currently in Pain?: Yes Pain Score: 2  Pain Location: Knee Pain Orientation: Right  Objective:   Exercise/Treatments Aerobic Stationary Bike: nustep hill level 3, resistnace 3 x 8:00 average SPM 88 Standing Heel Raises: 15 reps;Limitations Heel Raises Limitations: Toe raises 15reps Forward Step Up: Right;15 reps;Hand Hold: 0;Step Height: 6" Rocker Board: 2 minutes SLS: R 3", Lt 11" max of 3, 2x 30" with 1 finger assistance   Manual Therapy Manual Therapy: Joint mobilization Joint Mobilization: patella tracking all directions, tib/fib mobs. Other Manual Therapy: Retro massage with LE elevated to decrease swelling.  Pt educated on benefits of scar tissue massage.    Physical Therapy Assessment and Plan PT Assessment and Plan Clinical Impression Statement: Pallor facial color following step up training, pt had a seat and BP checked at 110/56, stopped standing exercises for rest of session.  Reviewed goals, pt has met all STG except for pain with standing.  Pt reported current pain scale 2/10 with pain average scale at 6/10.  Manual techniques complete to decrease edema, pt educated on techniques to assist wtih scar tissue over incision and patella mobs to reduce knee "popping" during gait. PT Plan: work on balance, strengthening and pain, begin tandem stance/gait;  Increase standing time to reach goals.      Goals Home Exercise Program Pt/caregiver will Perform Home Exercise Program:  For increased strengthening PT Goal: Perform Home Exercise Program - Progress: Met PT Short Term Goals Time to Complete Short Term Goals: 2 weeks PT Short Term Goal 1: Pt to be able to sit for 15 minutes without increased pain to be able to enjoy a small meal PT Short Term Goal 1 - Progress: Met PT Short Term Goal 2: Pt to be able to stand for 15 minutes without increased pain to make a small meal PT Short Term Goal 2 - Progress: Met PT Short Term Goal 3: Pt to be able to walk for 15 minutes without increased pain to complete short shopping trips PT Short Term Goal 3 - Progress: Met PT Short Term Goal 4: Pain to be no greater than a 6 PT Short Term Goal 4 - Progress: Progressing toward goal PT Long Term Goals Time to Complete Long Term Goals: 4 weeks PT Long Term Goal 1: Pt to be I in advance HEP PT Long Term Goal 1 - Progress: Progressing toward goal PT Long Term Goal 2: Pt to be able to sit for 45 minutes without increased pain to be able to go out to eat PT Long Term Goal 2 - Progress: Met Long Term Goal 3: Pt to be able to stand for 30 minutes to make a meall Long Term Goal 3 Progress: Progressing toward goal (Able to stand 30 min with increased pain ) Long Term Goal 4: Pt to be able to walk for 40 minutes to be able to shop. Long Term Goal 4 Progress: Progressing toward goal  Problem List Patient Active Problem List   Diagnosis Date Noted  . Postoperative anemia due to acute blood loss 02/26/2013  .  Anemia, unspecified 02/26/2013  . DJD (degenerative joint disease) of knee 02/25/2013  . PONV (postoperative nausea and vomiting)   . Stroke   . Hypercholesterolemia   . Cancer   . Colon polyps   . Arthritis   . Rheumatoid arthritis with rheumatoid factor   . Right knee DJD   . Asthenia 08/14/2012  . Cerebral aneurysm, nonruptured 08/14/2012  . TIA (transient ischemic attack) 08/14/2012  . RA (rheumatoid arthritis) 10/27/2011  . High cholesterol 10/27/2011  . Hypertension  10/27/2011  . GERD (gastroesophageal reflux disease) 10/27/2011    PT - End of Session Activity Tolerance: Patient tolerated treatment well General Behavior During Therapy: [facility] for tasks assessed/performed  GP    Aldona Lento 03/28/2013, 2:52 PM

## 2013-04-02 ENCOUNTER — Inpatient Hospital Stay (HOSPITAL_COMMUNITY): Admission: RE | Admit: 2013-04-02 | Payer: 59 | Source: Ambulatory Visit

## 2013-04-04 ENCOUNTER — Ambulatory Visit (HOSPITAL_COMMUNITY)
Admission: RE | Admit: 2013-04-04 | Discharge: 2013-04-04 | Disposition: A | Discharge status: Home / Self Care | Payer: 59 | Source: Ambulatory Visit | Attending: Internal Medicine | Admitting: Internal Medicine

## 2013-04-04 NOTE — Progress Notes (Signed)
Physical Therapy Treatment Patient Details  Name: [name] MRN: [id number] Date of Birth: February 01, 1951  Today's Date: 04/04/2013 Time: 5053-9767 PT Time Calculation (min): 46 min Charges: Therex x 34'(1937-9024) Manual x 15' (1505-1520)  Visit#: 5 of 8  Re-eval: 04/11/13   Subjective: Symptoms/Limitations Symptoms: Pt states that her pain increased significantly earlier in the week and it has not subsided. Pain Assessment Currently in Pain?: Yes Pain Score: 6  Pain Location: Knee Pain Orientation: Right  Exercise/Treatments Standing Heel Raises: 15 reps;Limitations Heel Raises Limitations: Toe raises 15reps Lateral Step Up:  (Attempted, too painful) Rocker Board: 2 minutes SLS: R 4", Lt 8" max of 3, 2x 30" with 1 finger assistance Supine Quad Sets: 10 reps Short Arc Quad Sets: 10 reps Terminal Knee Extension: 10 reps  Manual Therapy Manual Therapy: Myofascial release Joint Mobilization: Grade II-III a/p tib/fib mobilization to right knee decrease pain Myofascial Release: MFR completed to right lateral knee decrease pain and fascial restrictions  Physical Therapy Assessment and Plan PT Assessment and Plan Clinical Impression Statement: Pt presents with c/o increased lateral knee pain. Completed less intense standing exercises secondary to increased pain. Manual techniques completed to right lateral knee to decrease fascial restrictions and pain. Pt displays decreased swelling in right knee. Pt reports decrease pain at end of session. PT Plan: Progress strengtehning acitvities as pain allows.    Problem List Patient Active Problem List   Diagnosis Date Noted  . Postoperative anemia due to acute blood loss 02/26/2013  . Anemia, unspecified 02/26/2013  . DJD (degenerative joint disease) of knee 02/25/2013  . PONV (postoperative nausea and vomiting)   . Stroke   . Hypercholesterolemia   . Cancer   . Colon polyps   . Arthritis   . Rheumatoid arthritis with  rheumatoid factor   . Right knee DJD   . Asthenia 08/14/2012  . Cerebral aneurysm, nonruptured 08/14/2012  . TIA (transient ischemic attack) 08/14/2012  . RA (rheumatoid arthritis) 10/27/2011  . High cholesterol 10/27/2011  . Hypertension 10/27/2011  . GERD (gastroesophageal reflux disease) 10/27/2011    PT - End of Session Activity Tolerance: Patient tolerated treatment well General Behavior During Therapy: [facility] for tasks assessed/performed  Rachelle Hora, PTA 04/04/2013, 3:40 PM

## 2013-04-09 ENCOUNTER — Ambulatory Visit (HOSPITAL_COMMUNITY): Payer: 59 | Admitting: Physical Therapy

## 2013-04-11 ENCOUNTER — Ambulatory Visit (HOSPITAL_COMMUNITY): Payer: 59 | Admitting: *Deleted

## 2013-04-16 ENCOUNTER — Ambulatory Visit (HOSPITAL_COMMUNITY)
Admission: RE | Admit: 2013-04-16 | Discharge: 2013-04-16 | Disposition: A | Discharge status: Home / Self Care | Payer: 59 | Source: Ambulatory Visit | Attending: Orthopedic Surgery | Admitting: Orthopedic Surgery

## 2013-04-16 DIAGNOSIS — R29898 Other symptoms and signs involving the musculoskeletal system: Secondary | ICD-10-CM | POA: Insufficient documentation

## 2013-04-16 DIAGNOSIS — M25669 Stiffness of unspecified knee, not elsewhere classified: Secondary | ICD-10-CM | POA: Insufficient documentation

## 2013-04-16 DIAGNOSIS — E7800 Pure hypercholesterolemia, unspecified: Secondary | ICD-10-CM | POA: Insufficient documentation

## 2013-04-16 DIAGNOSIS — IMO0001 Reserved for inherently not codable concepts without codable children: Secondary | ICD-10-CM | POA: Insufficient documentation

## 2013-04-16 DIAGNOSIS — M25569 Pain in unspecified knee: Secondary | ICD-10-CM | POA: Insufficient documentation

## 2013-04-16 DIAGNOSIS — I10 Essential (primary) hypertension: Secondary | ICD-10-CM | POA: Insufficient documentation

## 2013-04-16 DIAGNOSIS — R262 Difficulty in walking, not elsewhere classified: Secondary | ICD-10-CM | POA: Insufficient documentation

## 2013-04-16 NOTE — Progress Notes (Signed)
Physical Therapy Treatment Patient Details  Name: [name] MRN: [id number] Date of Birth: Jun 02, 1950  Today's Date: 04/16/2013 Time: 3212-2482 PT Time Calculation (min): 30 min Charges: Therex x 15' Manual x 15'  Visit#: 6 of 8  Re-eval: 04/11/13   Subjective: Symptoms/Limitations Symptoms: Pt reports increased pain in right lateral knee with ambulation. Pain Assessment Currently in Pain?: Yes Pain Score: 7  Pain Location: Knee Pain Orientation: Right  Exercise/Treatments Supine Quad Sets: 10 reps;Limitations Quad Sets Limitations: 10" Straight Leg Raises: 10 reps Sidelying Hip ABduction: 10 reps Hip ADduction: 10 reps  Manual Therapy Joint Mobilization: Grade II-III a/p tib/fib mobilization to right knee decrease pain Myofascial Release: MFR completed to right lateral knee decrease pain and fascial restrictions along with retrograde techniques to decrease swelling  Physical Therapy Assessment and Plan PT Assessment and Plan Clinical Impression Statement: Pt complains of significant pain in right lateral knee with ambulation. Increased swelling and tenderness noted in this area with palpation. Manual techniques completed to right lateral knee to decrease pain and swelling. Encouraged pt to elevate Les frequently and limit stress on knee over the next few days. Encouraged pt to continue open chain strengthening at home. PT Plan: Reassess next session.    Problem List Patient Active Problem List   Diagnosis Date Noted  . Postoperative anemia due to acute blood loss 02/26/2013  . Anemia, unspecified 02/26/2013  . DJD (degenerative joint disease) of knee 02/25/2013  . PONV (postoperative nausea and vomiting)   . Stroke   . Hypercholesterolemia   . Cancer   . Colon polyps   . Arthritis   . Rheumatoid arthritis with rheumatoid factor   . Right knee DJD   . Asthenia 08/14/2012  . Cerebral aneurysm, nonruptured 08/14/2012  . TIA (transient ischemic attack)  08/14/2012  . RA (rheumatoid arthritis) 10/27/2011  . High cholesterol 10/27/2011  . Hypertension 10/27/2011  . GERD (gastroesophageal reflux disease) 10/27/2011    PT - End of Session Activity Tolerance: Patient tolerated treatment well General Behavior During Therapy: [facility] for tasks assessed/performed  Rachelle Hora, PTA  04/16/2013, 11:58 AM

## 2013-04-18 ENCOUNTER — Ambulatory Visit (HOSPITAL_COMMUNITY)
Admission: RE | Admit: 2013-04-18 | Discharge: 2013-04-18 | Disposition: A | Discharge status: Home / Self Care | Payer: 59 | Source: Ambulatory Visit | Attending: Internal Medicine | Admitting: Internal Medicine

## 2013-04-18 NOTE — Evaluation (Signed)
Physical Therapy Reevaluation  Patient Details  Name: [name] MRN: [id number] Date of Birth: 1950/10/02  Today's Date: 04/18/2013 Time: 0850-0930 PT Time Calculation (min): 40 min Charges: MMT/ROMM x 2(7062-3762) Self care x [phone]) Manual x 10'(0920-0930)              Visit#: 7 of 8  Re-eval: 04/11/13 Assessment Diagnosis: Rt TKR Next MD Visit: 04/29/2013  Past Medical History:  Past Medical History  Diagnosis Date  . PONV (postoperative nausea and vomiting)   . Hypertension   . Angina   . Hypercholesterolemia   . Numbness and tingling in right hand   . GERD (gastroesophageal reflux disease)   . Constipation   . Diarrhea   . Colon polyps   . Arthritis     Rheumatoid  . Cancer     left breast, cervical cancer  . Rheumatoid arthritis with rheumatoid factor   . Right knee DJD   . Heart murmur   . Stroke     jan 2012  . Peripheral vascular disease     cerebrel aneurysm /sp coil  . Anemia    Past Surgical History:  Past Surgical History  Procedure Laterality Date  . Right brain aneurysm  04/2010    Coiled   . Abdominal hysterectomy  1977  . Cholecystectomy    . Bilateral oophorectomy    . Left knee arthroscopy    . Right wrist surgery  1999    synevectomy  . Left breast lumpectomy  2004  . Appendectomy  1964  . Colonoscopy  12/09/2010    Procedure: COLONOSCOPY;  Surgeon: Rogene Houston, MD;  Location: [facility];  Service: Endoscopy;  Laterality: N/A;  . Total knee arthroplasty Right 02/25/2013    Procedure: TOTAL KNEE ARTHROPLASTY;  Surgeon: Lorn Junes, MD;  Location: [place];  Service: Orthopedics;  Laterality: Right;    Subjective Symptoms/Limitations Symptoms: Pt states that her pain is still elevated. Pain Assessment Currently in Pain?: Yes Pain Score: 7  Pain Location: Knee Pain Orientation: Right  Precautions/Restrictions  History of breast cancer (2004)  Sensation/Coordination/Flexibility/Functional Tests Functional  Tests Functional Tests: FS 35 (Last measured 03/14/13: FS 52 (score has decreased secondary )  Assessment RLE AROM (degrees) Right Knee Extension: 0 (Last measurement 03/14/13: 7) Right Knee Flexion: 128 (Last measurement 03/14/13: 120) RLE Strength Right Hip Flexion: 5/5 (Last measured 03/14/13: 4/5) Right Hip Extension: 5/5 (Last measured 03/14/13: 3/5) Right Hip ABduction: 5/5 (Last measured 03/14/13: 4/5) Right Knee Flexion: 5/5 (Last measured 03/14/13: 4/5) Right Knee Extension: 5/5 (Last measured 03/14/13: 3+/5) Right Ankle Dorsiflexion: 5/5 (Last measured 03/14/13: 3+/5)  Treatments Manual Therapy Joint Mobilization: Grade II-III a/p tib/fib mobilization to right knee decrease pain Myofascial Release: Gentle MFR completed to right lateral knee decrease pain and fascial restrictions  Physical Therapy Assessment and Plan PT Assessment and Plan Clinical Impression Statement: Pt displays improved strength and ROM. Pt complains of increased pain in proximal tibia. Localized swelling noted in this area. FOTO score has decreased secondary to increased pain. Pt may benefit from beginning iontophoresis tx with dexamethasone. PT Frequency: Min 2X/week PT Duration:  (3 weeks) PT Treatment/Interventions: Other (comment) (Iontophoresis with dexamethasone ) PT Plan: Recommend to begin iontophoresis tx with dexamethasone 2 x per week for 3 more weeks.     Goals Home Exercise Program Pt/caregiver will Perform Home Exercise Program: For increased strengthening PT Short Term Goals Time to Complete Short Term Goals: 2 weeks PT Short Term Goal 1: Pt to  be able to sit for 15 minutes without increased pain to be able to enjoy a small meal PT Short Term Goal 1 - Progress: Met PT Short Term Goal 2: Pt to be able to stand for 15 minutes without increased pain to make a small meal PT Short Term Goal 2 - Progress: Not met PT Short Term Goal 3: Pt to be able to walk for 15 minutes without increased pain to  complete short shopping trips PT Short Term Goal 3 - Progress: Not met PT Short Term Goal 4: Pain to be no greater than a 6 PT Short Term Goal 4 - Progress: Not met PT Long Term Goals Time to Complete Long Term Goals: 4 weeks PT Long Term Goal 1: Pt to be I in advance HEP PT Long Term Goal 1 - Progress: Met PT Long Term Goal 2: Pt to be able to sit for 45 minutes without increased pain to be able to go out to eat PT Long Term Goal 2 - Progress: Met Long Term Goal 3: Pt to be able to stand for 30 minutes to make a meall Long Term Goal 3 Progress: Not met Long Term Goal 4: Pt to be able to walk for 40 minutes to be able to shop. Long Term Goal 4 Progress: Not met  Problem List Patient Active Problem List   Diagnosis Date Noted  . Postoperative anemia due to acute blood loss 02/26/2013  . Anemia, unspecified 02/26/2013  . DJD (degenerative joint disease) of knee 02/25/2013  . PONV (postoperative nausea and vomiting)   . Stroke   . Hypercholesterolemia   . Cancer   . Colon polyps   . Arthritis   . Rheumatoid arthritis with rheumatoid factor   . Right knee DJD   . Asthenia 08/14/2012  . Cerebral aneurysm, nonruptured 08/14/2012  . TIA (transient ischemic attack) 08/14/2012  . RA (rheumatoid arthritis) 10/27/2011  . High cholesterol 10/27/2011  . Hypertension 10/27/2011  . GERD (gastroesophageal reflux disease) 10/27/2011    PT - End of Session Activity Tolerance: Patient tolerated treatment well General Behavior During Therapy: [facility] for tasks assessed/performed  Rachelle Hora, PTA  04/18/2013, 9:50 AM  Physician Documentation Your signature is required to indicate approval of the treatment plan as stated above.  Please sign and either send electronically or make a copy of this report for your files and return this physician signed original.   Please mark one 1.__approve of plan  2. ___approve of plan with the following conditions.   ______________________________                                                           _____________________ Physician Signature  Date  

## 2013-04-23 ENCOUNTER — Ambulatory Visit (HOSPITAL_COMMUNITY)
Admission: RE | Admit: 2013-04-23 | Discharge: 2013-04-23 | Disposition: A | Discharge status: Home / Self Care | Payer: 59 | Source: Ambulatory Visit | Attending: Internal Medicine | Admitting: Internal Medicine

## 2013-04-23 NOTE — Progress Notes (Signed)
Physical Therapy Treatment Patient Details  Name: [name] MRN: [id number] Date of Birth: 1950/08/14  Today's Date: 04/23/2013 Time: 1751-0258 PT Time Calculation (min): 12 min Charges: Iontophoresis x 1   Visit#: 8 of 13  Re-eval: 04/11/13   Subjective: Symptoms/Limitations Symptoms: Pt states that her knee began to feel better over the weekend but pain increases with activity. Pain Assessment Currently in Pain?: Yes Pain Score: 7  Pain Location: Knee Pain Orientation: Right  Exercise/Treatments  Modalities Modalities: Iontophoresis Iontophoresis Type of Iontophoresis: Dexamethasone Location: Right lateral knee Dose: 1 cc dexamathasone  Time: n/a  Physical Therapy Assessment and Plan PT Assessment and Plan Clinical Impression Statement: Pt contineus to be limited by pain. Pt seen for iontophoresis treatment to decrease inflammation and pain. Pt educated on rationale for iotntophoresis. Advised pt to remove patch after four hours. Wncourgaged pt to watch area and remove patch immideately if itching or redness occurs. PT Frequency: Min 2X/week PT Duration:  (3 weeks) PT Treatment/Interventions: Other (comment) (Iontophoresis with dexamethasone ) PT Plan: Continue iontophoresis for pain control x 5 more visits.    Problem List Patient Active Problem List   Diagnosis Date Noted  . Postoperative anemia due to acute blood loss 02/26/2013  . Anemia, unspecified 02/26/2013  . DJD (degenerative joint disease) of knee 02/25/2013  . PONV (postoperative nausea and vomiting)   . Stroke   . Hypercholesterolemia   . Cancer   . Colon polyps   . Arthritis   . Rheumatoid arthritis with rheumatoid factor   . Right knee DJD   . Asthenia 08/14/2012  . Cerebral aneurysm, nonruptured 08/14/2012  . TIA (transient ischemic attack) 08/14/2012  . RA (rheumatoid arthritis) 10/27/2011  . High cholesterol 10/27/2011  . Hypertension 10/27/2011  . GERD (gastroesophageal reflux  disease) 10/27/2011    PT - End of Session Activity Tolerance: Patient tolerated treatment well General Behavior During Therapy: [facility] for tasks assessed/performed  Rachelle Hora, PTA  04/23/2013, 2:29 PM

## 2013-04-25 ENCOUNTER — Inpatient Hospital Stay (HOSPITAL_COMMUNITY)
Admission: RE | Admit: 2013-04-25 | Discharge: 2013-04-25 | Disposition: A | Discharge status: Home / Self Care | Payer: 59 | Source: Ambulatory Visit | Attending: *Deleted | Admitting: *Deleted

## 2013-04-30 ENCOUNTER — Telehealth (HOSPITAL_COMMUNITY): Payer: Self-pay | Admitting: Physical Therapy

## 2013-04-30 ENCOUNTER — Inpatient Hospital Stay (HOSPITAL_COMMUNITY): Admission: RE | Admit: 2013-04-30 | Payer: 59 | Source: Ambulatory Visit | Admitting: Physical Therapy

## 2013-04-30 NOTE — Evaluation (Signed)
Physical Therapy Discharge Summary  Patient Details  Name: [name] MRN: [id number] Date of Birth: 25-Jun-1950  Today's Date: 04/30/2013   Subjective:Pt called clinic at 3:45 pm. She states that the medicine (iontophoriessis with dexamethazone) did not help. (Pt only received one treatment.) She states that MD told her that she did not need any further therapy.  Plan: D/C per pt request.  Rachelle Hora, PTA  04/30/2013, 3:48 PM  Physician Documentation Your signature is required to indicate approval of the treatment plan as stated above.  Please sign and either send electronically or make a copy of this report for your files and return this physician signed original.   Please mark one 1.__approve of plan  2. ___approve of plan with the following conditions.   ______________________________                                                          _____________________ Physician Signature                                                                                                             Date

## 2013-05-02 ENCOUNTER — Inpatient Hospital Stay (HOSPITAL_COMMUNITY): Admission: RE | Admit: 2013-05-02 | Payer: 59 | Source: Ambulatory Visit | Admitting: Physical Therapy

## 2013-05-07 ENCOUNTER — Ambulatory Visit (HOSPITAL_COMMUNITY): Payer: 59 | Admitting: *Deleted

## 2013-05-09 ENCOUNTER — Ambulatory Visit (HOSPITAL_COMMUNITY): Payer: 59 | Admitting: Physical Therapy

## 2013-05-27 ENCOUNTER — Encounter (INDEPENDENT_AMBULATORY_CARE_PROVIDER_SITE_OTHER): Payer: Self-pay | Admitting: Internal Medicine

## 2013-05-27 ENCOUNTER — Ambulatory Visit (INDEPENDENT_AMBULATORY_CARE_PROVIDER_SITE_OTHER): Payer: 59 | Admitting: Internal Medicine

## 2013-05-27 VITALS — BP 140/80 | HR 78 | Temp 97.0°F | Resp 18 | Ht 67.5 in | Wt 194.6 lb

## 2013-05-27 DIAGNOSIS — K589 Irritable bowel syndrome without diarrhea: Secondary | ICD-10-CM

## 2013-05-27 DIAGNOSIS — K219 Gastro-esophageal reflux disease without esophagitis: Secondary | ICD-10-CM

## 2013-05-27 DIAGNOSIS — D649 Anemia, unspecified: Secondary | ICD-10-CM

## 2013-05-27 MED ORDER — DICYCLOMINE HCL 10 MG PO CAPS: 10.0000 mg | ORAL_CAPSULE | Freq: Two times a day (BID) | ORAL | Status: DC | PRN

## 2013-05-27 MED ORDER — LANSOPRAZOLE 30 MG PO CPDR: 30.0000 mg | DELAYED_RELEASE_CAPSULE | Freq: Two times a day (BID) | ORAL | Status: DC

## 2013-05-27 NOTE — Progress Notes (Signed)
Presenting complaint;  Heartburn despite therapy. Lower abdominal pain and diarrhea.  Subjective:  Patient is 63 year old Caucasian female who has history of GERD and presents for scheduled visit. She was last seen in December 2013. She says she's been having problems since she had a right knee replacement 2 months ago. She thought breakthrough symptoms are secondary to pain medications that she is discontinued. She is having heartburn more often and she also has stabbing intrascapular pain with certain foods and at times when she drinks water. This pain only lasts for a few seconds each time. She has not experienced nausea or vomiting. She also denies dysphagia. She also complains of pain across her lower abdomen which occurs every day usually after meals and relieved with defecation. Stool is formed to begin with but then determined to lose. She denies melena or rectal bleeding. Asian has history of colonic adenomas and her last exam was in October 2012. She has lost 20 pounds since her visit of December 2013. She says weight loss started last year but she believes it is stopped. She says she is recovered rather quickly following knee surgery and now has no pain. However she did require 2 units of PRBCs perioperatively for drop in her H&H. She is scheduled to have blood work near future.   Current Medications: Outpatient Encounter Prescriptions as of 05/27/2013  Medication Sig  . allopurinol (ZYLOPRIM) 100 MG tablet Take 100 mg by mouth daily.  Marland Kitchen amitriptyline (ELAVIL) 50 MG tablet Take 50 mg by mouth at bedtime.    Marland Kitchen aspirin 325 MG tablet Take 325 mg by mouth daily.  Marland Kitchen BENICAR 40 MG tablet Take 40 mg by mouth daily.   . Calcium Carbonate-Vit D-Min (CALTRATE 600+D PLUS) 600-400 MG-UNIT per tablet Chew 1 tablet by mouth 2 (two) times daily.    . cetirizine (ZYRTEC) 10 MG tablet Take 10 mg by mouth daily.  . Cholecalciferol (VITAMIN D) 2000 UNITS tablet Take 2,000 Units by mouth daily.   .  Coenzyme Q10 (CO Q-10) 200 MG CAPS Take 2 capsules by mouth daily.    . colchicine 0.6 MG tablet Take 0.6 mg by mouth daily.  . furosemide (LASIX) 40 MG tablet Take 40 mg by mouth daily.   Marland Kitchen HYDROcodone-acetaminophen (NORCO) 10-325 MG per tablet 1-2 tablets every 4-6 hrs as needed for pain  . hydroxychloroquine (PLAQUENIL) 200 MG tablet Take 200 mg by mouth 2 (two) times daily.  . lansoprazole (PREVACID) 30 MG capsule Take 30 mg by mouth daily at 12 noon.  . magnesium oxide (MAG-OX) 400 MG tablet Take 400 mg by mouth daily.    . meloxicam (MOBIC) 15 MG tablet Take 15 mg by mouth daily.    . methylPREDNISolone (MEDROL) 4 MG tablet Take 4 mg by mouth daily.   . Nebivolol HCl (BYSTOLIC) 20 MG TABS Take 30 mg by mouth daily.   Vladimir Faster Glycol-Propyl Glycol (SYSTANE) 0.4-0.3 % GEL Apply 1 application to eye daily as needed. For dry eye relief  . pravastatin (PRAVACHOL) 40 MG tablet Take 80 mg by mouth at bedtime.   . sucralfate (CARAFATE) 1 G tablet TAKE (1) TABLET BY MOUTH (4) TIMES DAILY.  Marland Kitchen Tofacitinib Citrate (XELJANZ) 5 MG TABS Take 5 mg by mouth 2 (two) times daily.  . traMADol (ULTRAM) 50 MG tablet Take 50 mg by mouth as needed.  . [DISCONTINUED] bisacodyl (DULCOLAX) 5 MG EC tablet Take 2 tablets every night with dinner until bowel movement.  LAXITIVE.  Restart if two  days since last bowel movement  . [DISCONTINUED] docusate sodium 100 MG CAPS 1 tab 2 times a day while on narcotics.  STOOL SOFTENER  . [DISCONTINUED] rivaroxaban (XARELTO) 10 MG TABS tablet Take 1 tablet (10 mg total) by mouth daily with breakfast.     Objective: Blood pressure 140/80, pulse 78, temperature 97 F (36.1 C), temperature source Oral, resp. rate 18, height 5' 7.5" (1.715 m), weight 194 lb 9.6 oz (88.27 kg). Patient is alert and in no acute distress. Conjunctiva is pink. Sclera is nonicteric Oropharyngeal mucosa is normal. No neck masses or thyromegaly noted. Cardiac exam with regular rhythm normal S1 and  S2. Grade 2/6 systolic ejection murmur noted at LLSB. Lungs are clear to auscultation. Abdomen is full. Bowel sounds are normal. No bruits noted. Abdomen is soft with mild tenderness at epigastrium. No organomegaly or masses noted.  No LE edema or clubbing noted.    Assessment:  #1. GERD. Symptoms are not well controlled with combination of single-dose PPI and sucralfate. She does not have a long symptoms. She is on chronic NSAID therapy her symptoms are not typical of PUD. If she does not respond to change her therapy she may need EGD. #2. Irritable bowel syndrome. Her symptoms consisting of lower abdominal pain postprandial bowel movement with relief of pain are quite typical. Last colonoscopy was 2-1/2 years ago.    Plan:  Discontinue sucralfate. Increased lansoprazole 30 mg by mouth twice a day. Dicyclomine 10 mg by mouth twice a day. Hemoccult x1. Office visit in 3 months.

## 2013-05-27 NOTE — Patient Instructions (Signed)
Hemoccult x 1 

## 2013-07-09 ENCOUNTER — Telehealth (INDEPENDENT_AMBULATORY_CARE_PROVIDER_SITE_OTHER): Payer: Self-pay | Admitting: *Deleted

## 2013-07-09 NOTE — Telephone Encounter (Signed)
[name] LM stating she is calling to let Dr. Laural Golden know the PREVACID is not covered by insurance company. When he seen him last, she had just got it filled. She is needing more medicine at this time. Would like for the nurse to return her call at work [phone].

## 2013-07-12 NOTE — Telephone Encounter (Signed)
I have talked with the patient. She is going to check her insurance to see which PPI's they will cover. She is in need of a PPI . We have samples of Nexium 24 hour. Per Cherylann Parr may givve her samples until we find out which PPI is covered.

## 2013-07-15 ENCOUNTER — Other Ambulatory Visit (INDEPENDENT_AMBULATORY_CARE_PROVIDER_SITE_OTHER): Payer: Self-pay | Admitting: *Deleted

## 2013-07-15 MED ORDER — OMEPRAZOLE 40 MG PO CPDR: 40.0000 mg | DELAYED_RELEASE_CAPSULE | Freq: Every day | ORAL | Status: DC

## 2013-07-15 NOTE — Telephone Encounter (Signed)
Patient's Insurance will not cover the current PPI. Prevacid. They will cover the Omeprazole. Patient states that she took this a long time ago. It worked then , but [employer] stopped covering it.

## 2013-07-29 ENCOUNTER — Encounter: Payer: Self-pay | Admitting: Nurse Practitioner

## 2013-07-29 ENCOUNTER — Ambulatory Visit (INDEPENDENT_AMBULATORY_CARE_PROVIDER_SITE_OTHER): Payer: 59 | Admitting: Nurse Practitioner

## 2013-07-29 VITALS — BP 140/64 | HR 60 | Ht 67.0 in | Wt 197.5 lb

## 2013-07-29 DIAGNOSIS — G459 Transient cerebral ischemic attack, unspecified: Secondary | ICD-10-CM

## 2013-07-29 NOTE — Progress Notes (Signed)
PATIENT: [name] DOB: [birth year]  REASON FOR VISIT: routine follow up for stroke HISTORY FROM: patient  HISTORY OF PRESENT ILLNESS: 63 year old lady with left hemispheric TIA in January 2012 from left internal carotid artery occlusion. 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   She returns today for followup of the last visit on 08/23/12 with Dr. Leonie Man. She continues to do well without any recurrent stroke or TIA symptoms. She is tolerating aspirin quite well with only minor bruising and no bleeding episodes. She states she had a cholesterol checked about a month ago by her primary physician and it was good. She has not had any more episodes of memory loss that she had last year. She states that she remembers no driving out off the driveway after getting a haircut and the next thing she remembers is she ended up somewhere else on the a different part of the city. She apparently drove around for 30 minutes and had no recollection as to what she was thinking of doing. She did not have any accidents.  She denied feeling confused or having headache or tightness after this.  She does not report any significant cognitive decline and memory problems or trouble doing her activities of daily living. On mental status exam today she had no deficits, a perfect 30/30 score.  She states her blood pressure is well controlled, it is 140/64 in the office today.  She is tolerating aspirin well with no signs of significant bleeding or bruising.  ROS:  14 system review of systems is positive for no complaints.  ALLERGIES: Allergies  Allergen Reactions  . Aurothioglucose [Solganal] Other (See Comments)    REACTION: VASCULITIS   . Calcium Channel Blockers Palpitations  . Feldene [Piroxicam] Anaphylaxis and Other (See Comments)    SKIN BLISTERS  . Hydralazine Other (See Comments)    REACTION: Chest pains and headaches: IV only  .  Imdur [Isosorbide Mononitrate] Other (See Comments)    REACTION: PATIENT IS UNABLE TO WALK OR FUNCTION  . Imuran [Azathioprine]     Could not walk or function  . Methotrexate Derivatives Other (See Comments)    REACTION: VASCULITIS  . Neomycin Rash  . Nickel Rash  . Thimerosal Other (See Comments)    SKIN BLISTERED  . Percocet [Oxycodone-Acetaminophen] Other (See Comments)    REACTION : INSOMNIA  . Vioxx [Rofecoxib] Other (See Comments)    REACTION: CAUSED MORE JOINT PAIN  . Arava [Leflunomide]     REACTION: UNKNOWN  . Asacol [Mesalamine] Other (See Comments)    REACTION: UNKNOWN  . Hydrocodone Itching  . Iohexol      Desc: PT STATES SHE WAS TOLD NEVER TO HAVE CT CONTRAST AGAIN.  SHE HAD SOME KIND OF REACTION BUT DOESNT REMEBER WHAT HAPPENED   . Lidocaine Other (See Comments)    REACTION: ONLY WITH THE PRESERVATIVES IN THE MEDICATION  . Sulfa Antibiotics     UNKNOWN  . Latex Rash  . Nsaids Other (See Comments)    GI UPSET , CAN TOLERATE SOME NSAIDS    HOME MEDICATIONS: Outpatient Prescriptions Prior to Visit  Medication Sig Dispense Refill  . allopurinol (ZYLOPRIM) 100 MG tablet Take 100 mg by mouth daily.      Marland Kitchen amitriptyline (ELAVIL) 50 MG tablet Take 50 mg by mouth at bedtime.        Marland Kitchen aspirin 325 MG tablet Take 325 mg by mouth daily.      Marland Kitchen  BENICAR 40 MG tablet Take 40 mg by mouth daily.       . Calcium Carbonate-Vit D-Min (CALTRATE 600+D PLUS) 600-400 MG-UNIT per tablet Chew 1 tablet by mouth 2 (two) times daily.        . cetirizine (ZYRTEC) 10 MG tablet Take 10 mg by mouth daily.      . Cholecalciferol (VITAMIN D) 2000 UNITS tablet Take 2,000 Units by mouth daily.       . Coenzyme Q10 (CO Q-10) 200 MG CAPS Take 2 capsules by mouth daily.        . colchicine 0.6 MG tablet Take 0.6 mg by mouth daily.      Marland Kitchen dicyclomine (BENTYL) 10 MG capsule Take 1 capsule (10 mg total) by mouth 2 (two) times daily as needed for spasms.  60 capsule  5  . furosemide (LASIX) 40 MG tablet  Take 40 mg by mouth daily.       Marland Kitchen HYDROcodone-acetaminophen (NORCO) 10-325 MG per tablet 1-2 tablets every 4-6 hrs as needed for pain  100 tablet  0  . hydroxychloroquine (PLAQUENIL) 200 MG tablet Take 200 mg by mouth 2 (two) times daily.      . lansoprazole (PREVACID) 30 MG capsule Take 1 capsule (30 mg total) by mouth 2 (two) times daily before a meal.  180 capsule  1  . magnesium oxide (MAG-OX) 400 MG tablet Take 400 mg by mouth daily.        . meloxicam (MOBIC) 15 MG tablet Take 15 mg by mouth daily.        . methylPREDNISolone (MEDROL) 4 MG tablet Take 4 mg by mouth daily.       . Nebivolol HCl (BYSTOLIC) 20 MG TABS Take 30 mg by mouth daily.       Marland Kitchen omeprazole (PRILOSEC) 40 MG capsule Take 1 capsule (40 mg total) by mouth daily.  90 capsule  3  . Polyethyl Glycol-Propyl Glycol (SYSTANE) 0.4-0.3 % GEL Apply 1 application to eye daily as needed. For dry eye relief      . pravastatin (PRAVACHOL) 40 MG tablet Take 80 mg by mouth at bedtime.       . Tofacitinib Citrate (XELJANZ) 5 MG TABS Take 5 mg by mouth 2 (two) times daily.      . traMADol (ULTRAM) 50 MG tablet Take 50 mg by mouth as needed.       No facility-administered medications prior to visit.     PHYSICAL EXAM  Filed Vitals:   07/29/13 1457  BP: 140/64  Pulse: 60  Height: 5\' 7"  (1.702 m)  Weight: 197 lb 8 oz (89.585 kg)   Body mass index is 30.93 kg/(m^2). No exam data present   Physical Exam  General: well developed, well nourished, seated, in no evident distress  Head: head normocephalic and atraumatic. Orohparynx benign  Neck: supple with soft right carotid bruit  Cardiovascular: regular rate and rhythm, no murmurs  Musculoskeletal: no deformity  Skin: no rash/petichiae  Vascular: Normal pulses all extremities   Neurologic Exam  Mental Status: Awake and fully alert. Oriented to place and time. Recent and remote memory intact. MMSE 30/30, Animal Naming 13. Attention span, concentration and fund of knowledge  appropriate. Clock drawing 4/4.  Cranial Nerves: Fundoscopic exam not done. Pupils equal, briskly reactive to light. Extraocular movements full without nystagmus. Visual fields full to confrontation. Hearing intact. Facial sensation intact. Face, tongue, palate moves normally and symmetrically.  Motor: Normal bulk and tone. Normal strength in all  tested extremity muscles.  Sensory: intact to light touch on all 4 extremities. Coordination: Rapid alternating movements normal in all extremities. Finger-to-nose and heel-to-shin performed accurately bilaterally.  Gait and Station: Arises from chair without difficulty. Stance is normal. Gait demonstrates normal stride length and balance. Able to heel, toe and tandem walk without difficulty.  Reflexes: 1+ and symmetric. Toes downgoing.    ASSESSMENT AND PLAN 63 year old lady with left hemispheric TIA in January 2012 from left internal carotid artery occlusion. 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 Episode of memory loss in 2014 suggestive of transient global amnesia though TIA and seizures are also a possibility. No recurrent episodes since spring 2014.  PLAN:  Continue aspirin for stroke prevention with strict control of lipids and LDL cholesterol goal below 100 mg percent and hypertension with blood pressure goal below 140/90.   Return for followup in 6 months, sooner as needed.  Philmore Pali, MSN, NP-C 07/29/2013, 4:03 PM [facility] [address], [place], [place] [address] [phone]  Note: This document was prepared with digital dictation and possible smart phrase technology. Any transcriptional errors that result from this process are unintentional.

## 2013-07-29 NOTE — Progress Notes (Signed)
I agree with above 

## 2013-07-29 NOTE — Patient Instructions (Signed)
Continue aspirin for stroke prevention with strict control of lipids and LDL cholesterol goal below 100 mg percent and hypertension with blood pressure goal below 140/90.   Return for followup in 6 months, sooner as needed.   Stroke Prevention Some medical conditions and behaviors are associated with an increased chance of having a stroke. You may prevent a stroke by making healthy choices and managing medical conditions. HOW CAN I REDUCE MY RISK OF HAVING A STROKE?   Stay physically active. Get at least 30 minutes of activity on most or all days.  Do not smoke. It may also be helpful to avoid exposure to secondhand smoke.  Limit alcohol use. Moderate alcohol use is considered to be:  No more than 2 drinks per day for men.  No more than 1 drink per day for nonpregnant women.  Eat healthy foods. This involves  Eating 5 or more servings of fruits and vegetables a day.  Following a diet that addresses high blood pressure (hypertension), high cholesterol, diabetes, or obesity.  Manage your cholesterol levels.  A diet low in saturated fat, trans fat, and cholesterol and high in fiber may control cholesterol levels.  Take any prescribed medicines to control cholesterol as directed by your health care provider.  Manage your diabetes.  A controlled-carbohydrate, controlled-sugar diet is recommended to manage diabetes.  Take any prescribed medicines to control diabetes as directed by your health care provider.  Control your hypertension.  A low-salt (sodium), low-saturated fat, low-trans fat, and low-cholesterol diet is recommended to manage hypertension.  Take any prescribed medicines to control hypertension as directed by your health care provider.  Maintain a healthy weight.  A reduced-calorie, low-sodium, low-saturated fat, low-trans fat, low-cholesterol diet is recommended to manage weight.  Stop drug abuse.  Avoid taking birth control pills.  Talk to your health care  provider about the risks of taking birth control pills if you are over 64 years old, smoke, get migraines, or have ever had a blood clot.  Get evaluated for sleep disorders (sleep apnea).  Talk to your health care provider about getting a sleep evaluation if you snore a lot or have excessive sleepiness.  Take medicines as directed by your health care provider.  For some people, aspirin or blood thinners (anticoagulants) are helpful in reducing the risk of forming abnormal blood clots that can lead to stroke. If you have the irregular heart rhythm of atrial fibrillation, you should be on a blood thinner unless there is a good reason you cannot take them.  Understand all your medicine instructions.  Make sure that other other conditions (such as anemia or atherosclerosis) are addressed. SEEK IMMEDIATE MEDICAL CARE IF:   You have sudden weakness or numbness of the face, arm, or leg, especially on one side of the body.  Your face or eyelid droops to one side.  You have sudden confusion.  You have trouble speaking (aphasia) or understanding.  You have sudden trouble seeing in one or both eyes.  You have sudden trouble walking.  You have dizziness.  You have a loss of balance or coordination.  You have a sudden, severe headache with no known cause.  You have new chest pain or an irregular heartbeat. Any of these symptoms may represent a serious problem that is an emergency. Do not wait to see if the symptoms will go away. Get medical help at once. Call your local emergency services  (911 in [place] Do not drive yourself to the hospital. Document Released: 03/10/2004  Document Revised: 11/21/2012 Document Reviewed: 08/03/2012 [facility] Patient Information 2014 [employer].

## 2013-08-14 ENCOUNTER — Telehealth (INDEPENDENT_AMBULATORY_CARE_PROVIDER_SITE_OTHER): Payer: Self-pay | Admitting: *Deleted

## 2013-08-14 NOTE — Telephone Encounter (Signed)
Dr. Laural Golden made aware. He orders Cipro 500 mg take 1 by mouth twice a day for 10 days #20 no refills. Flagyl 500 mg take 1 by mouth twice a day for 10 days #20 no refills. Office appointment next week. Patient be advised that if abdominal pain worsens or a temperature of 102 or greater , she is to go to the ED. Patient has a history of Diverticula.  Rx was called to the [facility]. Patient was made aware. Forwarded to [facility] for appointment next week.

## 2013-08-14 NOTE — Telephone Encounter (Signed)
Patient states that she started having pain Friday @ 5 am,Lower Left Abdomen, with chills. Rested Saturday and Sunday. Monday night temp of 99 ,felt that she was freezing to death. 05/04/2022- pain in the left lower abdomen , felt like a hot poker to touch. Waves of nausea , weak. Diarrhea. Patient states that she can go to the bath room and have what is a normal BM, then 30 minutes later diarrhea. She goes to the bathroom 5-6 times or 1-2 times daily.  Patient is taking Dicyclomine 10 mg twice a day , and her Omeprazole daily. Currently has an appointment with Dr.Rehman on July 28 th.

## 2013-08-21 NOTE — Telephone Encounter (Signed)
Apt has been scheduled for 08/26/13 with Dr. Laural Golden.

## 2013-08-26 ENCOUNTER — Ambulatory Visit (INDEPENDENT_AMBULATORY_CARE_PROVIDER_SITE_OTHER): Payer: 59 | Admitting: Internal Medicine

## 2013-08-26 ENCOUNTER — Encounter (INDEPENDENT_AMBULATORY_CARE_PROVIDER_SITE_OTHER): Payer: Self-pay | Admitting: Internal Medicine

## 2013-08-26 VITALS — BP 128/76 | HR 74 | Temp 97.2°F | Resp 18 | Ht 67.5 in | Wt 194.2 lb

## 2013-08-26 DIAGNOSIS — K5732 Diverticulitis of large intestine without perforation or abscess without bleeding: Secondary | ICD-10-CM

## 2013-08-26 NOTE — Progress Notes (Signed)
Presenting complaint;  Followup for diverticulitis.  Subjective:  Patient is 63 year old Caucasian female who noted LLQ abdominal pain on 08/09/2013 and felt better however 3 days later she had more pain fever and chills. She called our office on 08/14/2013 and was begun on ciprofloxacin and metronidazole. She was given 10 days prescription for both. She has finished antibiotic given. She reports complete resolution of her abdominal pain. She denies diarrhea but stools greenish and soft. Also denies melena or rectal bleeding. She states she stopped meloxicam about a week prior to onset of the symptoms. She is having much less joint pain since she's been on therapy for gout. Since last colonoscopy was in October 2012 and she was found to have sigmoid colon diverticulosis. She remains with good appetite. Her weight is stable. She states heartburn well-controlled with omeprazole.   Current Medications: Outpatient Encounter Prescriptions as of 08/26/2013  Medication Sig  . allopurinol (ZYLOPRIM) 100 MG tablet Take 100 mg by mouth 2 (two) times daily.   Marland Kitchen amitriptyline (ELAVIL) 50 MG tablet Take 50 mg by mouth at bedtime.    Marland Kitchen aspirin 325 MG tablet Take 325 mg by mouth daily.  Marland Kitchen BENICAR 40 MG tablet Take 40 mg by mouth daily.   . Calcium Carbonate-Vit D-Min (CALTRATE 600+D PLUS) 600-400 MG-UNIT per tablet Chew 1 tablet by mouth 2 (two) times daily.    . cetirizine (ZYRTEC) 10 MG tablet Take 10 mg by mouth daily.  . Cholecalciferol (VITAMIN D) 2000 UNITS tablet Take 2,000 Units by mouth daily.   . ciclopirox (PENLAC) 8 % solution Apply topically at bedtime.   . Coenzyme Q10 (CO Q-10) 200 MG CAPS Take 2 capsules by mouth daily.    . colchicine 0.6 MG tablet Take 0.6 mg by mouth daily.  Marland Kitchen dicyclomine (BENTYL) 10 MG capsule Take 1 capsule (10 mg total) by mouth 2 (two) times daily as needed for spasms.  . furosemide (LASIX) 40 MG tablet Take 40 mg by mouth daily.   . hydroxychloroquine  (PLAQUENIL) 200 MG tablet Take 200 mg by mouth 2 (two) times daily.  . magnesium oxide (MAG-OX) 400 MG tablet Take 400 mg by mouth daily.    . methylPREDNISolone (MEDROL) 4 MG tablet Take 4 mg by mouth daily.   . Nebivolol HCl (BYSTOLIC) 20 MG TABS Take 30 mg by mouth daily.   Marland Kitchen omeprazole (PRILOSEC) 40 MG capsule Take 1 capsule (40 mg total) by mouth daily.  Vladimir Faster Glycol-Propyl Glycol (SYSTANE) 0.4-0.3 % GEL Apply 1 application to eye daily as needed. For dry eye relief  . pravastatin (PRAVACHOL) 40 MG tablet Take 80 mg by mouth at bedtime.   . Tofacitinib Citrate (XELJANZ) 5 MG TABS Take 5 mg by mouth 2 (two) times daily.  . traMADol (ULTRAM) 50 MG tablet Take 50 mg by mouth as needed.  . [DISCONTINUED] HYDROcodone-acetaminophen (NORCO) 10-325 MG per tablet 1-2 tablets every 4-6 hrs as needed for pain  . [DISCONTINUED] lansoprazole (PREVACID) 30 MG capsule Take 1 capsule (30 mg total) by mouth 2 (two) times daily before a meal.  . [DISCONTINUED] meloxicam (MOBIC) 15 MG tablet Take 15 mg by mouth daily.       Objective: Blood pressure 128/76, pulse 74, temperature 97.2 F (36.2 C), temperature source Oral, resp. rate 18, height 5' 7.5" (1.715 m), weight 194 lb 3.2 oz (88.089 kg). Patient is alert and in no acute distress. Conjunctiva is pink. Sclera is nonicteric Oropharyngeal mucosa is normal. No neck masses or thyromegaly  noted. Abdomen is full. Bowel sounds are normal. On palpation abdomen is soft and nontender without organomegaly or masses  No LE edema or clubbing noted.  Assessment:  Sigmoid colon diverticulitis. Diagnosis based on patient's symptoms and she has fully recovered with antibiotic use. Patient informed that she should not wait for several days before calling if she has another episode.   Plan:  High fiber diet. Fiber supplement 3-4 g by mouth daily. Office visit in 6 months.

## 2013-08-26 NOTE — Patient Instructions (Signed)
High fiber diet; Fiber supplement 3-4 g by mouth daily at bedtime. After one week use dicyclomine on as-needed basis

## 2013-09-10 ENCOUNTER — Ambulatory Visit (INDEPENDENT_AMBULATORY_CARE_PROVIDER_SITE_OTHER): Payer: 59 | Admitting: Internal Medicine

## 2013-12-19 ENCOUNTER — Telehealth (INDEPENDENT_AMBULATORY_CARE_PROVIDER_SITE_OTHER): Payer: Self-pay | Admitting: *Deleted

## 2013-12-19 NOTE — Telephone Encounter (Signed)
Per Dr.Rehman the patient should follow-up with PCP to make sure that it is not UTI .If not she should talk with the prescribing physician of the Rituxin. Patient was called and made aware of his recommendations.

## 2013-12-19 NOTE — Telephone Encounter (Signed)
[name] is having nausea, right to left down to lower abd pain. She just started a new IV Medicine Rituxin for arthritis which is causing her reflux to act up. Please call her work number [phone] and have her paged.

## 2014-01-16 ENCOUNTER — Encounter (INDEPENDENT_AMBULATORY_CARE_PROVIDER_SITE_OTHER): Payer: Self-pay | Admitting: *Deleted

## 2014-02-03 ENCOUNTER — Ambulatory Visit: Payer: 59 | Admitting: Nurse Practitioner

## 2014-03-03 ENCOUNTER — Ambulatory Visit (INDEPENDENT_AMBULATORY_CARE_PROVIDER_SITE_OTHER): Payer: 59 | Admitting: Internal Medicine

## 2014-03-04 ENCOUNTER — Ambulatory Visit (INDEPENDENT_AMBULATORY_CARE_PROVIDER_SITE_OTHER): Payer: 59 | Admitting: Internal Medicine

## 2014-03-04 ENCOUNTER — Encounter (INDEPENDENT_AMBULATORY_CARE_PROVIDER_SITE_OTHER): Payer: Self-pay | Admitting: Internal Medicine

## 2014-03-04 VITALS — BP 108/50 | HR 60 | Temp 97.4°F | Ht 67.5 in | Wt 211.7 lb

## 2014-03-04 DIAGNOSIS — K5732 Diverticulitis of large intestine without perforation or abscess without bleeding: Secondary | ICD-10-CM

## 2014-03-04 DIAGNOSIS — K219 Gastro-esophageal reflux disease without esophagitis: Secondary | ICD-10-CM

## 2014-03-04 DIAGNOSIS — D369 Benign neoplasm, unspecified site: Secondary | ICD-10-CM

## 2014-03-04 NOTE — Progress Notes (Signed)
Subjective:    Patient ID: [name], female    DOB: 1950/04/12, 64 y.o.   MRN: [id number]  HPI Follow up of diverticulitis. She was last seen in July, 2015 by Dr. Laural Golden. She tells me over Christmas she had an upper respiratory infection and a UTI. She says she was on two antibiotic (Z-pack, and amoxicillin) and developed diarrhea and a yeast infection. The diarrhea lasted thru New Year's.  She says she has not had any more problems with diverticulitis.  No LLQ pain. No fever, nausea or vomiting Appetite is good. No weight loss. Her acid reflux controlled at this time.  She has a BM may one a day or she may have five stools a day.  Hx of diverticulitis in July of 2015 and started on Cipro and Flagyl x 10 days.  She has had recent lab work and she will fax it to our office.  12/09/2010 Colonoscopy with snare polypectomy:  Indications: Patient is 64 year old Caucasian female with history of colonic adenomas. Her last exam was in March 2006 with removal of 3 polyps. She is undergoing surveillance colonoscopy. Impression:  Normal terminal ileum. 7-8 mm sessile polyp at ileocecal valve. This could be prolapsed ileal mucosa but had different texture. Therefore it was snared. Few small diverticula at sigmoid colon.  Biopsy results reviewed with the patient. Small polyp that was removed from ileocecal valve turned out to be tubular adenoma. Next colonoscopy would be in 5 years.     CBC    Component Value Date/Time   WBC 19.3* 02/27/2013 0542   RBC 3.54* 02/27/2013 0542   HGB 9.1* 02/27/2013 0542   HCT 29.3* 02/27/2013 0542   PLT 246 02/27/2013 0542   MCV 82.8 02/27/2013 0542   MCH 25.7* 02/27/2013 0542   MCHC 31.1 02/27/2013 0542   RDW 16.4* 02/27/2013 0542   LYMPHSABS 2.3 05/25/2011 1436   MONOABS 0.7 05/25/2011 1436   EOSABS 0.2 05/25/2011 1436   BASOSABS 0.0 05/25/2011 1436    BMET    Component Value Date/Time   NA 140 02/27/2013 0542   K 4.5 02/27/2013 0542   CL 106 02/27/2013 0542   CO2 26 02/27/2013 0542   GLUCOSE 112* 02/27/2013 0542   BUN 18 02/27/2013 0542   CREATININE 0.75 02/27/2013 0542   CALCIUM 8.7 02/27/2013 0542   GFRNONAA 88* 02/27/2013 0542   GFRAA >90 02/27/2013 0542         Review of Systems Past Medical History  Diagnosis Date  . PONV (postoperative nausea and vomiting)   . Hypertension   . Angina   . Hypercholesterolemia   . Numbness and tingling in right hand   . GERD (gastroesophageal reflux disease)   . Constipation   . Diarrhea   . Colon polyps   . Arthritis     Rheumatoid  . Cancer     left breast, cervical cancer  . Rheumatoid arthritis with rheumatoid factor   . Right knee DJD   . Heart murmur   . Stroke     jan 2012  . Peripheral vascular disease     cerebrel aneurysm /sp coil  . Anemia     Past Surgical History  Procedure Laterality Date  . Right brain aneurysm  04/2010    Coiled   . Abdominal hysterectomy  1977  . Cholecystectomy    . Bilateral oophorectomy    . Left knee arthroscopy    . Right wrist surgery  1999    synevectomy  .  Left breast lumpectomy  2004  . Appendectomy  1964  . Colonoscopy  12/09/2010    Procedure: COLONOSCOPY;  Surgeon: Rogene Houston, MD;  [facility];  Service: Endoscopy;  Laterality: N/A;  . Total knee arthroplasty Right 02/25/2013    Procedure: TOTAL KNEE ARTHROPLASTY;  Surgeon: Lorn Junes, MD;  Location: [place];  Service: Orthopedics;  Laterality: Right;    Allergies  Allergen Reactions  . Aurothioglucose [Solganal] Other (See Comments)    REACTION: VASCULITIS   . Calcium Channel Blockers Palpitations  . Feldene [Piroxicam] Anaphylaxis and Other (See Comments)    SKIN BLISTERS  . Hydralazine Other (See Comments)    REACTION: Chest pains and headaches: IV only  . Imdur [Isosorbide Mononitrate] Other (See Comments)    REACTION: PATIENT IS UNABLE TO WALK OR FUNCTION  . Imuran [Azathioprine]     Could not walk or function  .  Methotrexate Derivatives Other (See Comments)    REACTION: VASCULITIS  . Neomycin Rash  . Nickel Rash  . Thimerosal Other (See Comments)    SKIN BLISTERED  . Percocet [Oxycodone-Acetaminophen] Other (See Comments)    REACTION : INSOMNIA  . Vioxx [Rofecoxib] Other (See Comments)    REACTION: CAUSED MORE JOINT PAIN  . Arava [Leflunomide]     REACTION: UNKNOWN  . Asacol [Mesalamine] Other (See Comments)    REACTION: UNKNOWN  . Hydrocodone Itching  . Iohexol      Desc: PT STATES SHE WAS TOLD NEVER TO HAVE CT CONTRAST AGAIN.  SHE HAD SOME KIND OF REACTION BUT DOESNT REMEBER WHAT HAPPENED   . Sulfa Antibiotics     UNKNOWN  . Latex Rash  . Nsaids Other (See Comments)    GI UPSET , CAN TOLERATE SOME NSAIDS    Current Outpatient Prescriptions on File Prior to Visit  Medication Sig Dispense Refill  . allopurinol (ZYLOPRIM) 100 MG tablet Take 300 mg by mouth daily.     Marland Kitchen amitriptyline (ELAVIL) 50 MG tablet Take 50 mg by mouth at bedtime.      Marland Kitchen aspirin 325 MG tablet Take 325 mg by mouth daily.    Marland Kitchen BENICAR 40 MG tablet Take 40 mg by mouth daily.     . Calcium Carbonate-Vit D-Min (CALTRATE 600+D PLUS) 600-400 MG-UNIT per tablet Chew 1 tablet by mouth 2 (two) times daily.      . cetirizine (ZYRTEC) 10 MG tablet Take 10 mg by mouth daily.    . Cholecalciferol (VITAMIN D) 2000 UNITS tablet Take 2,000 Units by mouth daily.     . ciclopirox (PENLAC) 8 % solution Apply topically at bedtime.     . Coenzyme Q10 (CO Q-10) 200 MG CAPS Take 2 capsules by mouth daily.      . furosemide (LASIX) 40 MG tablet Take 40 mg by mouth daily.     . hydroxychloroquine (PLAQUENIL) 200 MG tablet Take 200 mg by mouth daily.     . magnesium oxide (MAG-OX) 400 MG tablet Take 400 mg by mouth daily.      . methylPREDNISolone (MEDROL) 4 MG tablet Take 8 mg by mouth daily.     . Nebivolol HCl (BYSTOLIC) 20 MG TABS Take 30 mg by mouth daily.     Marland Kitchen omeprazole (PRILOSEC) 40 MG capsule Take 1 capsule (40 mg total) by mouth  daily. 90 capsule 3  . Polyethyl Glycol-Propyl Glycol (SYSTANE) 0.4-0.3 % GEL Apply 1 application to eye daily as needed. For dry eye relief    .  pravastatin (PRAVACHOL) 40 MG tablet Take 80 mg by mouth at bedtime.     . traMADol (ULTRAM) 50 MG tablet Take 50 mg by mouth as needed.     No current facility-administered medications on file prior to visit.        Objective:   Physical Exam  Filed Vitals:   03/04/14 0904  Height: 5' 7.5" (1.715 m)  Weight: 211 lb 11.2 oz (96.026 kg)   Alert and oriented. Skin warm and dry. Oral mucosa is moist.   . Sclera anicteric, conjunctivae is pink. Thyroid not enlarged. No cervical lymphadenopathy. Lungs clear. Heart regular rate and rhythm.  Abdomen is soft. Bowel sounds are positive. No hepatomegaly. No abdominal masses felt. No tenderness.  No edema to lower extremities.         Assessment & Plan:  Diverticulitis: No problems now. She feels good for the most part.  GERD controlled at this time.  OV in 6 months. She has had recent lab work and she will fax this to our office.

## 2014-03-04 NOTE — Patient Instructions (Signed)
OV in 6 months. Continue present medications. Any problems call our office.

## 2014-03-28 ENCOUNTER — Telehealth: Payer: Self-pay | Admitting: *Deleted

## 2014-03-28 NOTE — Telephone Encounter (Signed)
Called patient on both home and wok number, message was left for patient to call and r/s 2/19 appointment, patient may r/s with NP MM.

## 2014-04-02 NOTE — Telephone Encounter (Signed)
Called patient at work and left voice message to call the office back to r/s appointment

## 2014-04-04 ENCOUNTER — Ambulatory Visit: Payer: 59 | Admitting: Neurology

## 2014-07-21 ENCOUNTER — Other Ambulatory Visit (INDEPENDENT_AMBULATORY_CARE_PROVIDER_SITE_OTHER): Payer: Self-pay | Admitting: Internal Medicine

## 2014-09-02 ENCOUNTER — Ambulatory Visit (INDEPENDENT_AMBULATORY_CARE_PROVIDER_SITE_OTHER): Payer: 59 | Admitting: Internal Medicine

## 2014-09-02 ENCOUNTER — Encounter (INDEPENDENT_AMBULATORY_CARE_PROVIDER_SITE_OTHER): Payer: Self-pay | Admitting: Internal Medicine

## 2014-09-02 VITALS — BP 122/56 | HR 60 | Temp 97.6°F | Ht 67.5 in | Wt 209.6 lb

## 2014-09-02 DIAGNOSIS — K219 Gastro-esophageal reflux disease without esophagitis: Secondary | ICD-10-CM

## 2014-09-02 NOTE — Progress Notes (Signed)
Subjective:    Patient ID: [name], female    DOB: December 27, 1950, 64 y.o.   MRN: [id number]  HPI Here today for f/u. Just has started fiber. She tells me she is doing good. She says her stools looks gray. No melena or BRRB. Her stools are formed. She has 1-6 stools a day which is average for her. She occasionally has lower abdominal pain. Her appetite is good. She has lost 2 pounds since her last visit in January.  She says she has not had a flare of diverticulitis. Occasionally has acid reflux. Takes Omeprazole for her GERD.    CBC    Component Value Date/Time   WBC 19.3* 02/27/2013 0542   RBC 3.54* 02/27/2013 0542   HGB 9.1* 02/27/2013 0542   HCT 29.3* 02/27/2013 0542   PLT 246 02/27/2013 0542   MCV 82.8 02/27/2013 0542   MCH 25.7* 02/27/2013 0542   MCHC 31.1 02/27/2013 0542   RDW 16.4* 02/27/2013 0542   LYMPHSABS 2.3 05/25/2011 1436   MONOABS 0.7 05/25/2011 1436   EOSABS 0.2 05/25/2011 1436   BASOSABS 0.0 05/25/2011 1436    Hepatic Function Panel     Component Value Date/Time   PROT 7.0 02/22/2010 1311   ALBUMIN 3.8 02/22/2010 1311   AST 21 02/22/2010 1311   ALT 22 02/22/2010 1311   ALKPHOS 98 02/22/2010 1311   BILITOT 0.4 02/22/2010 1311       12/09/2010 Colonoscopy with snare polypectomy:  Indications: Patient is 64 year old Caucasian female with history of colonic adenomas. Her last exam was in March 2006 with removal of 3 polyps. She is undergoing surveillance colonoscopy. Impression:  Normal terminal ileum. 7-8 mm sessile polyp at ileocecal valve. This could be prolapsed ileal mucosa but had different texture. Therefore it was snared. Few small diverticula at sigmoid colon.  Biopsy results reviewed with the patient. Small polyp that was removed from ileocecal valve turned out to be tubular adenoma. Next colonoscopy would be in 5 years.  Review of Systems Past Medical History  Diagnosis Date  . PONV (postoperative nausea and vomiting)   .  Hypertension   . Angina   . Hypercholesterolemia   . Numbness and tingling in right hand   . GERD (gastroesophageal reflux disease)   . Constipation   . Diarrhea   . Colon polyps   . Arthritis     Rheumatoid  . Cancer     left breast, cervical cancer  . Rheumatoid arthritis with rheumatoid factor   . Right knee DJD   . Heart murmur   . Stroke     jan 2012  . Peripheral vascular disease     cerebrel aneurysm /sp coil  . Anemia     Past Surgical History  Procedure Laterality Date  . Right brain aneurysm  04/2010    Coiled   . Abdominal hysterectomy  1977  . Cholecystectomy    . Bilateral oophorectomy    . Left knee arthroscopy    . Right wrist surgery  1999    synevectomy  . Left breast lumpectomy  2004  . Appendectomy  1964  . Colonoscopy  12/09/2010    Procedure: COLONOSCOPY;  Surgeon: Rogene Houston, MD;  [facility];  Service: Endoscopy;  Laterality: N/A;  . Total knee arthroplasty Right 02/25/2013    Procedure: TOTAL KNEE ARTHROPLASTY;  Surgeon: Lorn Junes, MD;  Location: [place];  Service: Orthopedics;  Laterality: Right;    Allergies  Allergen Reactions  .  Aurothioglucose [Solganal] Other (See Comments)    REACTION: VASCULITIS   . Calcium Channel Blockers Palpitations  . Feldene [Piroxicam] Anaphylaxis and Other (See Comments)    SKIN BLISTERS  . Hydralazine Other (See Comments)    REACTION: Chest pains and headaches: IV only  . Imdur [Isosorbide Mononitrate] Other (See Comments)    REACTION: PATIENT IS UNABLE TO WALK OR FUNCTION  . Imuran [Azathioprine]     Could not walk or function  . Methotrexate Derivatives Other (See Comments)    REACTION: VASCULITIS  . Neomycin Rash  . Nickel Rash  . Thimerosal Other (See Comments)    SKIN BLISTERED  . Percocet [Oxycodone-Acetaminophen] Other (See Comments)    REACTION : INSOMNIA  . Vioxx [Rofecoxib] Other (See Comments)    REACTION: CAUSED MORE JOINT PAIN  . Arava [Leflunomide]     REACTION:  UNKNOWN  . Asacol [Mesalamine] Other (See Comments)    REACTION: UNKNOWN  . Hydrocodone Itching  . Iohexol      Desc: PT STATES SHE WAS TOLD NEVER TO HAVE CT CONTRAST AGAIN.  SHE HAD SOME KIND OF REACTION BUT DOESNT REMEBER WHAT HAPPENED   . Sulfa Antibiotics     UNKNOWN  . Latex Rash  . Nsaids Other (See Comments)    GI UPSET , CAN TOLERATE SOME NSAIDS    Current Outpatient Prescriptions on File Prior to Visit  Medication Sig Dispense Refill  . allopurinol (ZYLOPRIM) 100 MG tablet Take 300 mg by mouth daily.     Marland Kitchen amitriptyline (ELAVIL) 50 MG tablet Take 50 mg by mouth at bedtime.      Marland Kitchen aspirin 325 MG tablet Take 325 mg by mouth daily.    . Calcium Carbonate-Vit D-Min (CALTRATE 600+D PLUS) 600-400 MG-UNIT per tablet Chew 1 tablet by mouth 2 (two) times daily.      . cetirizine (ZYRTEC) 10 MG tablet Take 10 mg by mouth daily.    . Cholecalciferol (VITAMIN D) 2000 UNITS tablet Take 2,000 Units by mouth daily.     . Coenzyme Q10 (CO Q-10) 200 MG CAPS Take 1 capsule by mouth daily.     . furosemide (LASIX) 40 MG tablet Take 20 mg by mouth daily.     . hydroxychloroquine (PLAQUENIL) 200 MG tablet Take 200 mg by mouth daily.     . magnesium oxide (MAG-OX) 400 MG tablet Take 400 mg by mouth daily.      . methylPREDNISolone (MEDROL) 4 MG tablet Take 8 mg by mouth daily.     . Nebivolol HCl (BYSTOLIC) 20 MG TABS Take 30 mg by mouth daily.     Marland Kitchen omeprazole (PRILOSEC) 40 MG capsule TAKE (1) CAPSULE BY MOUTH ONCE DAILY. 30 capsule 11  . Polyethyl Glycol-Propyl Glycol (SYSTANE) 0.4-0.3 % GEL Apply 1 application to eye daily as needed. For dry eye relief    . RiTUXimab (RITUXAN IV) Inject into the vein. One and then wait 2 weeks, then 6 months.    . traMADol (ULTRAM) 50 MG tablet Take 50 mg by mouth as needed.     No current facility-administered medications on file prior to visit.        Objective:   Physical Exam Blood pressure 122/56, pulse 60, temperature 97.6 F (36.4 C), height 5'  7.5" (1.715 m), weight 209 lb 9.6 oz (95.074 kg).  Alert and oriented. Skin warm and dry. Oral mucosa is moist.   . Sclera anicteric, conjunctivae is pink. Thyroid not enlarged. No cervical lymphadenopathy. Lungs clear. Heart  regular rate and rhythm.  Abdomen is soft. Bowel sounds are positive. No hepatomegaly. No abdominal masses felt. No tenderness. Abdomen is obese  No edema to lower extremities. Stool brown and guaiac negative.    Developer: [id number] ( 12/2017) Card: Lot [id number] (12/18)    Assessment & Plan:  GERD controlled at this time. Diverticulitis: No flare recently. For the most part she feels good. OV in 6 months.

## 2014-09-02 NOTE — Patient Instructions (Signed)
Continue the Omeprazole. OV in 6 months.

## 2014-11-26 ENCOUNTER — Other Ambulatory Visit: Payer: Self-pay | Admitting: Cardiology

## 2014-11-26 DIAGNOSIS — I872 Venous insufficiency (chronic) (peripheral): Secondary | ICD-10-CM

## 2014-11-26 DIAGNOSIS — I878 Other specified disorders of veins: Secondary | ICD-10-CM

## 2014-12-02 ENCOUNTER — Other Ambulatory Visit: Payer: 59

## 2014-12-02 ENCOUNTER — Inpatient Hospital Stay
Admission: RE | Admit: 2014-12-02 | Discharge: 2014-12-02 | Disposition: A | Discharge status: Home / Self Care | Payer: 59 | Source: Ambulatory Visit | Attending: Cardiology | Admitting: Cardiology

## 2014-12-10 ENCOUNTER — Ambulatory Visit
Admission: RE | Admit: 2014-12-10 | Discharge: 2014-12-10 | Disposition: A | Discharge status: Home / Self Care | Payer: 59 | Source: Ambulatory Visit | Attending: Cardiology | Admitting: Cardiology

## 2014-12-10 ENCOUNTER — Other Ambulatory Visit: Payer: Self-pay | Admitting: Cardiology

## 2014-12-10 DIAGNOSIS — I878 Other specified disorders of veins: Secondary | ICD-10-CM

## 2014-12-10 DIAGNOSIS — R0602 Shortness of breath: Secondary | ICD-10-CM

## 2014-12-10 DIAGNOSIS — I872 Venous insufficiency (chronic) (peripheral): Secondary | ICD-10-CM

## 2014-12-10 NOTE — Consult Note (Signed)
Chief Complaint: Patient was seen in consultation today for leg swelling at the request of Ganji,Jay  Referring Physician(s): Ganji,Jay  History of Present Illness: [name] is a 64 y.o. female who has had a one-year history of increasing swelling at her ankles left greater than right. She also describes some heaviness and achiness. This is worse at night. After sleeping, this markedly improves. She is currently working and does stand for multiple hours in a day. The pain does not interfere with her activities of daily living. She also complains of varicosities in her left calf. She has a history of rheumatoid arthritis and does have soft tissue nodules associated. She denies any skin breakdown.  Past Medical History  Diagnosis Date  . PONV (postoperative nausea and vomiting)   . Hypertension   . Angina   . Hypercholesterolemia   . Numbness and tingling in right hand   . GERD (gastroesophageal reflux disease)   . Constipation   . Diarrhea   . Colon polyps   . Arthritis     Rheumatoid  . Cancer ([place])     left breast, cervical cancer  . Rheumatoid arthritis with rheumatoid factor ([facility])   . Right knee DJD   . Heart murmur   . Stroke [facility])     jan 2012  . Peripheral vascular disease ([place])     cerebrel aneurysm /sp coil  . Anemia     Past Surgical History  Procedure Laterality Date  . Right brain aneurysm  04/2010    Coiled   . Abdominal hysterectomy  1977  . Cholecystectomy    . Bilateral oophorectomy    . Left knee arthroscopy    . Right wrist surgery  1999    synevectomy  . Left breast lumpectomy  2004  . Appendectomy  1964  . Colonoscopy  12/09/2010    Procedure: COLONOSCOPY;  Surgeon: Rogene Houston, MD;  [facility];  Service: Endoscopy;  Laterality: N/A;  . Total knee arthroplasty Right 02/25/2013    Procedure: TOTAL KNEE ARTHROPLASTY;  Surgeon: Lorn Junes, MD;  Location: [place];  Service: Orthopedics;  Laterality: Right;     Allergies: Aurothioglucose; Calcium channel blockers; Feldene; Hydralazine; Imdur; Imuran; Methotrexate derivatives; Neomycin; Nickel; Thimerosal; Percocet; Vioxx; Arava; Asacol; Hydrocodone; Iohexol; Sulfa antibiotics; Latex; and Nsaids  Medications: Prior to Admission medications   Medication Sig Start Date End Date Taking? Authorizing Provider  allopurinol (ZYLOPRIM) 100 MG tablet Take 300 mg by mouth daily.    Yes Historical Provider, MD  aspirin 325 MG tablet Take 325 mg by mouth daily.   Yes Historical Provider, MD  Calcium Carbonate-Vit D-Min (CALTRATE 600+D PLUS) 600-400 MG-UNIT per tablet Chew 1 tablet by mouth 2 (two) times daily.     Yes Historical Provider, MD  cetirizine (ZYRTEC) 10 MG tablet Take 10 mg by mouth daily.   Yes Historical Provider, MD  Cholecalciferol (VITAMIN D) 2000 UNITS tablet Take 2,000 Units by mouth daily.    Yes Historical Provider, MD  DULoxetine (CYMBALTA) 60 MG capsule Take 60 mg by mouth daily.   Yes Historical Provider, MD  ezetimibe (ZETIA) 10 MG tablet Take 10 mg by mouth daily.   Yes Historical Provider, MD  furosemide (LASIX) 40 MG tablet Take 20 mg by mouth daily.  07/25/12  Yes Historical Provider, MD  hydroxychloroquine (PLAQUENIL) 200 MG tablet Take 200 mg by mouth daily.    Yes Historical Provider, MD  losartan (COZAAR) 50 MG tablet Take  50 mg by mouth daily.   Yes Historical Provider, MD  magnesium oxide (MAG-OX) 400 MG tablet Take 400 mg by mouth daily.     Yes Historical Provider, MD  methylPREDNISolone (MEDROL) 4 MG tablet Take 8 mg by mouth daily.    Yes Historical Provider, MD  Nebivolol HCl (BYSTOLIC) 20 MG TABS Take 30 mg by mouth daily.    Yes Historical Provider, MD  omeprazole (PRILOSEC) 40 MG capsule TAKE (1) CAPSULE BY MOUTH ONCE DAILY. 07/21/14  Yes Rogene Houston, MD  Polyethyl Glycol-Propyl Glycol (SYSTANE) 0.4-0.3 % GEL Apply 1 application to eye daily as needed. For dry eye relief   Yes Historical Provider, MD  traMADol  (ULTRAM) 50 MG tablet Take 50 mg by mouth as needed.   Yes Historical Provider, MD  amitriptyline (ELAVIL) 50 MG tablet Take 50 mg by mouth at bedtime.      Historical Provider, MD  Coenzyme Q10 (CO Q-10) 200 MG CAPS Take 1 capsule by mouth daily.     Historical Provider, MD  RiTUXimab (RITUXAN IV) Inject into the vein. One and then wait 2 weeks, then 6 months.    Historical Provider, MD     Family History  Problem Relation Age of Onset  . Colon cancer Neg Hx   . Hypertension Father   . Heart attack Father     Social History   Social History  . Marital Status: Married    Spouse Name: Hedy Camara  . Number of Children: o  . Years of Education: 13   Occupational History  .     Social History Main Topics  . Smoking status: Former Smoker -- 2.00 packs/day for 15 years    Quit date: 02/20/1984  . Smokeless tobacco: Never Used     Comment: Quit 1986  . Alcohol Use: No  . Drug Use: No  . Sexual Activity: Not Asked   Other Topics Concern  . None   Social History Narrative   Employed with [employer]. Married with no children. Quit smoking 1986. No alcohol or illicit drug use.    Right handed    Some college.   Caffeine one cup of tea daily.     Review of Systems: A 12 point ROS discussed and pertinent positives are indicated in the HPI above.  All other systems are negative.  Review of Systems  Vital Signs: BP 147/54 mmHg  Pulse 58  Temp(Src) 97.9 F (36.6 C) (Oral)  Resp 15  Ht 5' 7.5" (1.715 m)  Wt 207 lb (93.895 kg)  BMI 31.92 kg/m2  SpO2 100%  Physical Exam  Constitutional: She is oriented to person, place, and time. She appears well-developed and well-nourished.  Musculoskeletal:  Examination of the left lower extremity demonstrates 2+ edema at the left ankle. There are spider veins along the medial left calf. Pulses are intact distally. No skin breakdown.  Examination of the right lower extremity demonstrates normal appearance of the skin  without evidence of edema or discoloration. No varicosities or spider veins. Pulses are intact distally.  Neurological: She is alert and oriented to person, place, and time.     Imaging: Ultrasound was performed today. Briefly, there is no evidence of DVT and no evidence of venous insufficiency.  Labs:  CBC: No results for input(s): WBC, HGB, HCT, PLT in the last 8760 hours.  COAGS: No results for input(s): INR, APTT in the last 8760 hours.  BMP: No results for input(s): NA, K, CL, CO2, GLUCOSE,  BUN, CALCIUM, CREATININE, GFRNONAA, GFRAA in the last 8760 hours.  Invalid input(s): CMP  LIVER FUNCTION TESTS: No results for input(s): BILITOT, AST, ALT, ALKPHOS, PROT, ALBUMIN in the last 8760 hours.  TUMOR MARKERS: No results for input(s): AFPTM, CEA, CA199, CHROMGRNA in the last 8760 hours.  Assessment and Plan:  Ms. Hottinger does have 2+ edema at the left ankle and spider veins along the left medial calf but no evidence of venous insufficiency. She also has no evidence of DVT. The etiology of her left ankle swelling is not known at this time. Because of her rheumatoid arthritis, placement of compression stockings would be problematic. I would not recommend this unless venous insufficiency occurs.  Thank you for this interesting consult.  I greatly enjoyed meeting RATASHA FABRE and look forward to participating in their care.  A copy of this report was sent to the requesting provider on this date.  Signed: Deangleo Passage, ART A 12/10/2014, 3:44 PM   I spent a total of  40 Minutes   in face to face in clinical consultation, greater than 50% of which was counseling/coordinating care for lower extremity edema.

## 2015-03-05 ENCOUNTER — Ambulatory Visit (INDEPENDENT_AMBULATORY_CARE_PROVIDER_SITE_OTHER): Payer: 59 | Admitting: Internal Medicine

## 2015-03-05 ENCOUNTER — Encounter (INDEPENDENT_AMBULATORY_CARE_PROVIDER_SITE_OTHER): Payer: Self-pay | Admitting: *Deleted

## 2015-03-09 ENCOUNTER — Telehealth (HOSPITAL_COMMUNITY): Payer: Self-pay

## 2015-03-09 ENCOUNTER — Ambulatory Visit (HOSPITAL_COMMUNITY): Payer: 59

## 2015-03-09 NOTE — Telephone Encounter (Signed)
She is doing much better and will not need our services at this time, if she needs [place] she will call us at a later date. NF 02/27/15

## 2015-03-09 NOTE — Telephone Encounter (Signed)
Called to notify MD office spoke to [facility], she will make a note of this call. NF 03/09/15

## 2015-03-09 NOTE — Telephone Encounter (Signed)
Pt canceled apptemnt for Hip, she states that she is doing better. She will call us back if she needs our services. Nf 03/09/15

## 2015-03-19 DIAGNOSIS — M1A09X0 Idiopathic chronic gout, multiple sites, without tophus (tophi): Secondary | ICD-10-CM | POA: Diagnosis not present

## 2015-03-19 DIAGNOSIS — M5136 Other intervertebral disc degeneration, lumbar region: Secondary | ICD-10-CM | POA: Diagnosis not present

## 2015-03-19 DIAGNOSIS — M0589 Other rheumatoid arthritis with rheumatoid factor of multiple sites: Secondary | ICD-10-CM | POA: Diagnosis not present

## 2015-03-19 DIAGNOSIS — M150 Primary generalized (osteo)arthritis: Secondary | ICD-10-CM | POA: Diagnosis not present

## 2015-03-19 DIAGNOSIS — M797 Fibromyalgia: Secondary | ICD-10-CM | POA: Diagnosis not present

## 2015-03-26 DIAGNOSIS — I10 Essential (primary) hypertension: Secondary | ICD-10-CM | POA: Diagnosis not present

## 2015-03-26 DIAGNOSIS — M1000 Idiopathic gout, unspecified site: Secondary | ICD-10-CM | POA: Diagnosis not present

## 2015-03-26 DIAGNOSIS — K1379 Other lesions of oral mucosa: Secondary | ICD-10-CM | POA: Diagnosis not present

## 2015-03-26 DIAGNOSIS — G629 Polyneuropathy, unspecified: Secondary | ICD-10-CM | POA: Diagnosis not present

## 2015-03-30 DIAGNOSIS — L718 Other rosacea: Secondary | ICD-10-CM | POA: Diagnosis not present

## 2015-03-30 DIAGNOSIS — B370 Candidal stomatitis: Secondary | ICD-10-CM | POA: Diagnosis not present

## 2015-04-09 DIAGNOSIS — M545 Low back pain: Secondary | ICD-10-CM | POA: Diagnosis not present

## 2015-04-09 DIAGNOSIS — Z96653 Presence of artificial knee joint, bilateral: Secondary | ICD-10-CM | POA: Diagnosis not present

## 2015-04-13 DIAGNOSIS — M5136 Other intervertebral disc degeneration, lumbar region: Secondary | ICD-10-CM | POA: Diagnosis not present

## 2015-04-13 DIAGNOSIS — M545 Low back pain: Secondary | ICD-10-CM | POA: Diagnosis not present

## 2015-04-16 DIAGNOSIS — M5136 Other intervertebral disc degeneration, lumbar region: Secondary | ICD-10-CM | POA: Diagnosis not present

## 2015-04-16 DIAGNOSIS — M545 Low back pain: Secondary | ICD-10-CM | POA: Diagnosis not present

## 2015-04-20 DIAGNOSIS — M545 Low back pain: Secondary | ICD-10-CM | POA: Diagnosis not present

## 2015-04-20 DIAGNOSIS — M5136 Other intervertebral disc degeneration, lumbar region: Secondary | ICD-10-CM | POA: Diagnosis not present

## 2015-04-22 ENCOUNTER — Ambulatory Visit (HOSPITAL_COMMUNITY): Payer: 59 | Admitting: Physical Therapy

## 2015-04-22 DIAGNOSIS — M545 Low back pain: Secondary | ICD-10-CM | POA: Diagnosis not present

## 2015-04-22 DIAGNOSIS — M5136 Other intervertebral disc degeneration, lumbar region: Secondary | ICD-10-CM | POA: Diagnosis not present

## 2015-04-27 DIAGNOSIS — M5136 Other intervertebral disc degeneration, lumbar region: Secondary | ICD-10-CM | POA: Diagnosis not present

## 2015-04-27 DIAGNOSIS — M545 Low back pain: Secondary | ICD-10-CM | POA: Diagnosis not present

## 2015-05-04 DIAGNOSIS — M5136 Other intervertebral disc degeneration, lumbar region: Secondary | ICD-10-CM | POA: Diagnosis not present

## 2015-05-04 DIAGNOSIS — M545 Low back pain: Secondary | ICD-10-CM | POA: Diagnosis not present

## 2015-05-06 DIAGNOSIS — Z79899 Other long term (current) drug therapy: Secondary | ICD-10-CM | POA: Diagnosis not present

## 2015-05-06 DIAGNOSIS — L821 Other seborrheic keratosis: Secondary | ICD-10-CM | POA: Diagnosis not present

## 2015-05-06 DIAGNOSIS — M5136 Other intervertebral disc degeneration, lumbar region: Secondary | ICD-10-CM | POA: Diagnosis not present

## 2015-05-06 DIAGNOSIS — M545 Low back pain: Secondary | ICD-10-CM | POA: Diagnosis not present

## 2015-05-06 DIAGNOSIS — K146 Glossodynia: Secondary | ICD-10-CM | POA: Diagnosis not present

## 2015-05-06 DIAGNOSIS — D224 Melanocytic nevi of scalp and neck: Secondary | ICD-10-CM | POA: Diagnosis not present

## 2015-05-07 DIAGNOSIS — M0589 Other rheumatoid arthritis with rheumatoid factor of multiple sites: Secondary | ICD-10-CM | POA: Diagnosis not present

## 2015-05-11 DIAGNOSIS — Z96653 Presence of artificial knee joint, bilateral: Secondary | ICD-10-CM | POA: Diagnosis not present

## 2015-05-11 DIAGNOSIS — M5136 Other intervertebral disc degeneration, lumbar region: Secondary | ICD-10-CM | POA: Diagnosis not present

## 2015-05-13 DIAGNOSIS — M545 Low back pain: Secondary | ICD-10-CM | POA: Diagnosis not present

## 2015-05-13 DIAGNOSIS — K146 Glossodynia: Secondary | ICD-10-CM | POA: Diagnosis not present

## 2015-05-13 DIAGNOSIS — M5136 Other intervertebral disc degeneration, lumbar region: Secondary | ICD-10-CM | POA: Diagnosis not present

## 2015-05-19 DIAGNOSIS — I6523 Occlusion and stenosis of bilateral carotid arteries: Secondary | ICD-10-CM | POA: Diagnosis not present

## 2015-05-21 DIAGNOSIS — M5136 Other intervertebral disc degeneration, lumbar region: Secondary | ICD-10-CM | POA: Diagnosis not present

## 2015-05-21 DIAGNOSIS — M545 Low back pain: Secondary | ICD-10-CM | POA: Diagnosis not present

## 2015-05-25 DIAGNOSIS — R002 Palpitations: Secondary | ICD-10-CM | POA: Diagnosis not present

## 2015-05-25 DIAGNOSIS — I6523 Occlusion and stenosis of bilateral carotid arteries: Secondary | ICD-10-CM | POA: Diagnosis not present

## 2015-05-25 DIAGNOSIS — I119 Hypertensive heart disease without heart failure: Secondary | ICD-10-CM | POA: Diagnosis not present

## 2015-05-25 DIAGNOSIS — R0602 Shortness of breath: Secondary | ICD-10-CM | POA: Diagnosis not present

## 2015-05-27 ENCOUNTER — Encounter (INDEPENDENT_AMBULATORY_CARE_PROVIDER_SITE_OTHER): Payer: Self-pay | Admitting: *Deleted

## 2015-06-04 ENCOUNTER — Other Ambulatory Visit (INDEPENDENT_AMBULATORY_CARE_PROVIDER_SITE_OTHER): Payer: Self-pay | Admitting: Internal Medicine

## 2015-06-04 ENCOUNTER — Other Ambulatory Visit (INDEPENDENT_AMBULATORY_CARE_PROVIDER_SITE_OTHER): Payer: Self-pay | Admitting: *Deleted

## 2015-06-04 ENCOUNTER — Ambulatory Visit (INDEPENDENT_AMBULATORY_CARE_PROVIDER_SITE_OTHER): Payer: Medicare Other | Admitting: Internal Medicine

## 2015-06-04 ENCOUNTER — Telehealth (INDEPENDENT_AMBULATORY_CARE_PROVIDER_SITE_OTHER): Payer: Self-pay | Admitting: Internal Medicine

## 2015-06-04 ENCOUNTER — Encounter (INDEPENDENT_AMBULATORY_CARE_PROVIDER_SITE_OTHER): Payer: Self-pay | Admitting: *Deleted

## 2015-06-04 ENCOUNTER — Encounter (INDEPENDENT_AMBULATORY_CARE_PROVIDER_SITE_OTHER): Payer: Self-pay | Admitting: Internal Medicine

## 2015-06-04 VITALS — BP 120/48 | HR 80 | Temp 98.5°F | Ht 67.5 in | Wt 198.0 lb

## 2015-06-04 DIAGNOSIS — Z86010 Personal history of colonic polyps: Secondary | ICD-10-CM | POA: Diagnosis not present

## 2015-06-04 DIAGNOSIS — D509 Iron deficiency anemia, unspecified: Secondary | ICD-10-CM

## 2015-06-04 DIAGNOSIS — M0589 Other rheumatoid arthritis with rheumatoid factor of multiple sites: Secondary | ICD-10-CM | POA: Diagnosis not present

## 2015-06-04 DIAGNOSIS — R195 Other fecal abnormalities: Secondary | ICD-10-CM

## 2015-06-04 DIAGNOSIS — Z79899 Other long term (current) drug therapy: Secondary | ICD-10-CM | POA: Diagnosis not present

## 2015-06-04 LAB — CBC WITH DIFFERENTIAL/PLATELET
BASOS ABS: 0 {cells}/uL (ref 0–200)
Basophils Relative: 0 %
EOS PCT: 1 %
Eosinophils Absolute: 184 cells/uL (ref 15–500)
HCT: 32.2 % — ABNORMAL LOW (ref 35.0–45.0)
HEMOGLOBIN: 9.3 g/dL — AB (ref 11.7–15.5)
Lymphocytes Relative: 13 %
Lymphs Abs: 2392 cells/uL (ref 850–3900)
MCH: 22.2 pg — AB (ref 27.0–33.0)
MCHC: 28.9 g/dL — AB (ref 32.0–36.0)
MCV: 76.8 fL — ABNORMAL LOW (ref 80.0–100.0)
MONOS PCT: 8 %
MPV: 8.9 fL (ref 7.5–12.5)
Monocytes Absolute: 1472 cells/uL — ABNORMAL HIGH (ref 200–950)
NEUTROS PCT: 78 %
Neutro Abs: 14352 cells/uL — ABNORMAL HIGH (ref 1500–7800)
PLATELETS: 365 10*3/uL (ref 140–400)
RBC: 4.19 MIL/uL (ref 3.80–5.10)
RDW: 19.7 % — ABNORMAL HIGH (ref 11.0–15.0)
WBC: 18.4 10*3/uL — ABNORMAL HIGH (ref 3.8–10.8)

## 2015-06-04 NOTE — Telephone Encounter (Signed)
Rachelle from [place] requested results of CBC to be faxed to attention Dr. Leigh Aurora when we get them.  Phone [phone] Fax [phone]

## 2015-06-04 NOTE — Telephone Encounter (Signed)
Patient needs trilyte 

## 2015-06-04 NOTE — Patient Instructions (Signed)
Colonoscopy.  The risks and benefits such as perforation, bleeding, and infection were reviewed with the patient and is agreeable. 

## 2015-06-04 NOTE — Progress Notes (Signed)
Subjective:    Patient ID: [name], female    DOB: 1950-11-17, 65 y.o.   MRN: [id number]  HPI Referred by Dr. Maudie Mercury for IDA.  Hx of anemia in the past. She tells me she is eating ice like it is going out of style. Appetite is not good. She is not hungry.  She has lost 13 pound since January of 2016. She has a BM 2-3 times a day and are loose (watery)..  Stools have been loose for about 1 1/2 weeks. Denies any viral illness.  No melena or BRRB. She did have a formed stool this am. She has noticed some mucous in her stools. She c/o lower abdominal pain at times.  She has acid reflux which is controlled with Omeprzole. Hx of colonic adenoma and her last colonoscopy was in 2012.  05/18/2015 Iron 12, TIBC 336, ferritin 12,  % Sat 3  Hx of breast cancer     1025/2012 Colonoscopy with snare polypectomy.  Indications: Patient is 65 year old Caucasian female with history of colonic adenomas. Her last exam was in March 2006 with removal of 3 polyps. She is undergoing surveillance colonoscopy.  Impression:  Normal terminal ileum. 7-8 mm sessile polyp at ileocecal valve. This could be prolapsed ileal mucosa but had different texture. Therefore it was snared. Few small diverticula at sigmoid colon.  Biopsy results reviewed with the patient. Small polyp that was removed from ileocecal valve turned out to be tubular adenoma. Next colonoscopy would be in 5 years. Review of Systems Past Medical History  Diagnosis Date  . PONV (postoperative nausea and vomiting)   . Hypertension   . Angina   . Hypercholesterolemia   . Numbness and tingling in right hand   . GERD (gastroesophageal reflux disease)   . Constipation   . Diarrhea   . Colon polyps   . Arthritis     Rheumatoid  . Cancer ([place])     left breast, cervical cancer  . Rheumatoid arthritis with rheumatoid factor (HCC)   . Right knee DJD   . Heart murmur   . Stroke [facility])     jan 2012  . Peripheral vascular disease ([place])     cerebrel aneurysm /sp coil  . Anemia     Past Surgical History  Procedure Laterality Date  . Right brain aneurysm  04/2010    Coiled   . Abdominal hysterectomy  1977  . Cholecystectomy    . Bilateral oophorectomy    . Left knee arthroscopy    . Right wrist surgery  1999    synevectomy  . Left breast lumpectomy  2004  . Appendectomy  1964  . Colonoscopy  12/09/2010    Procedure: COLONOSCOPY;  Surgeon: Rogene [place], MD;  Location: [facility];  Service: Endoscopy;  Laterality: N/A;  . Total knee arthroplasty Right 02/25/2013    Procedure: TOTAL KNEE ARTHROPLASTY;  Surgeon: Lorn Junes, MD;  Location: [place];  Service: Orthopedics;  Laterality: Right;    Allergies  Allergen Reactions  . Aurothioglucose [Solganal] Other (See Comments)    REACTION: VASCULITIS   . Calcium Channel Blockers Palpitations  . Feldene [Piroxicam] Anaphylaxis and Other (See Comments)    SKIN BLISTERS  . Hydralazine Other (See Comments)    REACTION: Chest pains and headaches: IV only  . Imdur [Isosorbide Mononitrate] Other (See Comments)    REACTION: PATIENT IS UNABLE TO WALK OR FUNCTION  . Imuran [Azathioprine]     Could not walk or function  .  Methotrexate Derivatives Other (See Comments)    REACTION: VASCULITIS  . Neomycin Rash  . Nickel Rash  . Thimerosal Other (See Comments)    SKIN BLISTERED  . Percocet [Oxycodone-Acetaminophen] Other (See Comments)    REACTION : INSOMNIA  . Vioxx [Rofecoxib] Other (See Comments)    REACTION: CAUSED MORE JOINT PAIN  . Arava [Leflunomide]     REACTION: UNKNOWN  . Asacol [Mesalamine] Other (See Comments)    REACTION: UNKNOWN  . Hydrocodone Itching  . Iohexol      Desc: PT STATES SHE WAS TOLD NEVER TO HAVE CT CONTRAST AGAIN.  SHE HAD SOME KIND OF REACTION BUT DOESNT REMEBER WHAT HAPPENED   . Sulfa Antibiotics     UNKNOWN  . Latex Rash  . Nsaids Other (See Comments)    GI UPSET , CAN TOLERATE SOME NSAIDS    Current Outpatient Prescriptions on  File Prior to Visit  Medication Sig Dispense Refill  . allopurinol (ZYLOPRIM) 100 MG tablet Take 300 mg by mouth daily.     Marland Kitchen aspirin 325 MG tablet Take 325 mg by mouth daily.    . Calcium Carbonate-Vit D-Min (CALTRATE 600+D PLUS) 600-400 MG-UNIT per tablet Chew 1 tablet by mouth 2 (two) times daily.      . cetirizine (ZYRTEC) 10 MG tablet Take 10 mg by mouth daily.    . Cholecalciferol (VITAMIN D) 2000 UNITS tablet Take 2,000 Units by mouth daily.     . DULoxetine (CYMBALTA) 60 MG capsule Take 60 mg by mouth daily.    Marland Kitchen ezetimibe (ZETIA) 10 MG tablet Take 10 mg by mouth daily.    . hydroxychloroquine (PLAQUENIL) 200 MG tablet Take 200 mg by mouth daily.     Marland Kitchen losartan (COZAAR) 50 MG tablet Take 100 mg by mouth daily.     . magnesium oxide (MAG-OX) 400 MG tablet Take 400 mg by mouth daily.      . Nebivolol HCl (BYSTOLIC) 20 MG TABS Take 30 mg by mouth daily.     Marland Kitchen omeprazole (PRILOSEC) 40 MG capsule TAKE (1) CAPSULE BY MOUTH ONCE DAILY. 30 capsule 11  . Polyethyl Glycol-Propyl Glycol (SYSTANE) 0.4-0.3 % GEL Apply 1 application to eye daily as needed. For dry eye relief    . traMADol (ULTRAM) 50 MG tablet Take 50 mg by mouth as needed.     No current facility-administered medications on file prior to visit.           Objective:   Physical Exam Blood pressure 120/48, pulse 80, temperature 98.5 F (36.9 C), height 5' 7.5" (1.715 m), weight 198 lb (89.812 kg). Alert and oriented. Skin warm and dry. Oral mucosa is moist.   . Sclera anicteric, conjunctivae is pink. Thyroid not enlarged. No cervical lymphadenopathy. Lungs clear. Heart regular rate and rhythm.  Abdomen is soft. Bowel sounds are positive. No hepatomegaly. No abdominal masses felt. No tenderness.  No edema to lower extremities.  Stool brown and guaiac positive.   Lot [id number] Ex 9/17    Assessment & Plan:  IDA. Needs CBC. ferritn Hx of adenoma: Needs surveillance for polyps and for IDA.  If colonoscopy normal, will need  an EGD.

## 2015-06-09 MED ORDER — PEG 3350-KCL-NA BICARB-NACL 420 G PO SOLR: 4000.0000 mL | Freq: Once | ORAL | Status: DC

## 2015-06-09 NOTE — Telephone Encounter (Signed)
Labs faxed

## 2015-06-18 DIAGNOSIS — M5136 Other intervertebral disc degeneration, lumbar region: Secondary | ICD-10-CM | POA: Diagnosis not present

## 2015-06-18 DIAGNOSIS — M1A09X0 Idiopathic chronic gout, multiple sites, without tophus (tophi): Secondary | ICD-10-CM | POA: Diagnosis not present

## 2015-06-18 DIAGNOSIS — M150 Primary generalized (osteo)arthritis: Secondary | ICD-10-CM | POA: Diagnosis not present

## 2015-06-18 DIAGNOSIS — K1379 Other lesions of oral mucosa: Secondary | ICD-10-CM | POA: Diagnosis not present

## 2015-06-18 DIAGNOSIS — M797 Fibromyalgia: Secondary | ICD-10-CM | POA: Diagnosis not present

## 2015-06-18 DIAGNOSIS — M0589 Other rheumatoid arthritis with rheumatoid factor of multiple sites: Secondary | ICD-10-CM | POA: Diagnosis not present

## 2015-06-26 ENCOUNTER — Encounter (HOSPITAL_COMMUNITY)
Admission: RE | Disposition: A | Discharge status: Home / Self Care | Payer: Self-pay | Source: Ambulatory Visit | Attending: Internal Medicine

## 2015-06-26 ENCOUNTER — Ambulatory Visit (HOSPITAL_COMMUNITY)
Admission: RE | Admit: 2015-06-26 | Discharge: 2015-06-26 | Disposition: A | Discharge status: Home / Self Care | Payer: Medicare Other | Source: Ambulatory Visit | Attending: Internal Medicine | Admitting: Internal Medicine

## 2015-06-26 ENCOUNTER — Encounter (HOSPITAL_COMMUNITY): Payer: Self-pay | Admitting: *Deleted

## 2015-06-26 DIAGNOSIS — D122 Benign neoplasm of ascending colon: Secondary | ICD-10-CM | POA: Insufficient documentation

## 2015-06-26 DIAGNOSIS — D120 Benign neoplasm of cecum: Secondary | ICD-10-CM | POA: Diagnosis not present

## 2015-06-26 DIAGNOSIS — Z96651 Presence of right artificial knee joint: Secondary | ICD-10-CM | POA: Diagnosis not present

## 2015-06-26 DIAGNOSIS — K317 Polyp of stomach and duodenum: Secondary | ICD-10-CM | POA: Insufficient documentation

## 2015-06-26 DIAGNOSIS — E7800 Pure hypercholesterolemia, unspecified: Secondary | ICD-10-CM | POA: Insufficient documentation

## 2015-06-26 DIAGNOSIS — K219 Gastro-esophageal reflux disease without esophagitis: Secondary | ICD-10-CM | POA: Insufficient documentation

## 2015-06-26 DIAGNOSIS — D500 Iron deficiency anemia secondary to blood loss (chronic): Secondary | ICD-10-CM | POA: Insufficient documentation

## 2015-06-26 DIAGNOSIS — R195 Other fecal abnormalities: Secondary | ICD-10-CM | POA: Insufficient documentation

## 2015-06-26 DIAGNOSIS — D123 Benign neoplasm of transverse colon: Secondary | ICD-10-CM | POA: Diagnosis not present

## 2015-06-26 DIAGNOSIS — Z853 Personal history of malignant neoplasm of breast: Secondary | ICD-10-CM | POA: Diagnosis not present

## 2015-06-26 DIAGNOSIS — Z86010 Personal history of colonic polyps: Secondary | ICD-10-CM | POA: Insufficient documentation

## 2015-06-26 DIAGNOSIS — Z8673 Personal history of transient ischemic attack (TIA), and cerebral infarction without residual deficits: Secondary | ICD-10-CM | POA: Insufficient documentation

## 2015-06-26 DIAGNOSIS — Z79899 Other long term (current) drug therapy: Secondary | ICD-10-CM | POA: Insufficient documentation

## 2015-06-26 DIAGNOSIS — D509 Iron deficiency anemia, unspecified: Secondary | ICD-10-CM

## 2015-06-26 DIAGNOSIS — I10 Essential (primary) hypertension: Secondary | ICD-10-CM | POA: Insufficient documentation

## 2015-06-26 DIAGNOSIS — K2900 Acute gastritis without bleeding: Secondary | ICD-10-CM | POA: Diagnosis not present

## 2015-06-26 DIAGNOSIS — Z7982 Long term (current) use of aspirin: Secondary | ICD-10-CM | POA: Diagnosis not present

## 2015-06-26 DIAGNOSIS — Z8541 Personal history of malignant neoplasm of cervix uteri: Secondary | ICD-10-CM | POA: Diagnosis not present

## 2015-06-26 HISTORY — PX: COLONOSCOPY: SHX5424

## 2015-06-26 HISTORY — PX: ESOPHAGOGASTRODUODENOSCOPY: SHX5428

## 2015-06-26 LAB — HEMOGLOBIN AND HEMATOCRIT, BLOOD
HCT: 35.4 % — ABNORMAL LOW (ref 36.0–46.0)
HEMOGLOBIN: 10.2 g/dL — AB (ref 12.0–15.0)

## 2015-06-26 SURGERY — COLONOSCOPY
Anesthesia: Moderate Sedation

## 2015-06-26 MED ORDER — MIDAZOLAM HCL 5 MG/5ML IJ SOLN
INTRAMUSCULAR | Status: AC
  Filled 2015-06-26: qty 10

## 2015-06-26 MED ORDER — STERILE WATER FOR IRRIGATION IR SOLN
Status: DC | PRN
  Administered 2015-06-26: 2.5 mL

## 2015-06-26 MED ORDER — MEPERIDINE HCL 50 MG/ML IJ SOLN
INTRAMUSCULAR | Status: AC
  Filled 2015-06-26: qty 1

## 2015-06-26 MED ORDER — MIDAZOLAM HCL 5 MG/5ML IJ SOLN
INTRAMUSCULAR | Status: DC | PRN
  Administered 2015-06-26 (×6): 2 mg via INTRAVENOUS

## 2015-06-26 MED ORDER — MEPERIDINE HCL 50 MG/ML IJ SOLN
INTRAMUSCULAR | Status: DC | PRN
  Administered 2015-06-26 (×4): 25 mg via INTRAVENOUS

## 2015-06-26 MED ORDER — SIMETHICONE 40 MG/0.6ML PO SUSP
ORAL | Status: AC
  Filled 2015-06-26: qty 30

## 2015-06-26 MED ORDER — BUTAMBEN-TETRACAINE-BENZOCAINE 2-2-14 % EX AERO
INHALATION_SPRAY | CUTANEOUS | Status: DC | PRN
  Administered 2015-06-26: 2 via TOPICAL

## 2015-06-26 MED ORDER — SODIUM CHLORIDE 0.9 % IV SOLN
INTRAVENOUS | Status: DC
Start: 2015-06-26 — End: 2015-06-26
  Administered 2015-06-26: 14:00:00 via INTRAVENOUS

## 2015-06-26 MED ORDER — MIDAZOLAM HCL 5 MG/5ML IJ SOLN
INTRAMUSCULAR | Status: AC
  Filled 2015-06-26: qty 5

## 2015-06-26 NOTE — Op Note (Signed)
[facility] Patient Name: [name] Procedure Date: 06/26/2015 2:49 PM MRN: [id number] Date of Birth: Feb 22, 1950 Attending MD: Hildred Laser , MD CSN: [id number] Age: 65 Admit Type: Outpatient Procedure:                Upper GI endoscopy Indications:              Iron deficiency anemia secondary to chronic blood                            loss, Heme positive stool Providers:                Hildred Laser, MD, Lurline Del, RN, Georgeann Oppenheim,                            Technician Referring MD:             Arlyss Repress, MD Medicines:                Cetacaine spray, Meperidine 50 mg IV, Midazolam 8                            mg IV Complications:            No immediate complications. Estimated Blood Loss:     Estimated blood loss was minimal. Procedure:                Pre-Anesthesia Assessment:                           - Prior to the procedure, a History and Physical                            was performed, and patient medications and                            allergies were reviewed. The patient's tolerance of                            previous anesthesia was also reviewed. The risks                            and benefits of the procedure and the sedation                            options and risks were discussed with the patient.                            All questions were answered, and informed consent                            was obtained. Prior Anticoagulants: The patient                            last took aspirin 2 days prior to the procedure.  ASA Grade Assessment: III - A patient with severe                            systemic disease. After reviewing the risks and                            benefits, the patient was deemed in satisfactory                            condition to undergo the procedure.                           After obtaining informed consent, the endoscope was                            passed under direct vision.  Throughout the                            procedure, the patient's blood pressure, pulse, and                            oxygen saturations were monitored continuously. The                            [id number] ([id number]) scope was introduced through the                            mouth, and advanced to the second part of duodenum.                            The upper GI endoscopy was accomplished without                            difficulty. The patient tolerated the procedure                            well. Scope In: 3:10:31 PM Scope Out: 3:17:02 PM Total Procedure Duration: 0 hours 6 minutes 31 seconds  Findings:      The examined esophagus was normal.      The Z-line was regular and was found 38 cm from the incisors.      Multiple 4 to 6 mm sessile polyps with no bleeding and no stigmata of       recent bleeding were found in the gastric antrum. Biopsies were taken       with a cold forceps for histology.      The exam of the stomach was otherwise normal.      The duodenal bulb and second portion of the duodenum were normal. Impression:               - Normal esophagus.                           - Z-line regular, 38 cm from the incisors.                           -  Multiple gastric polyps. Biopsied.                           - Normal duodenal bulb and second portion of the                            duodenum. Moderate Sedation:      Moderate (conscious) sedation was administered by the endoscopy nurse       and supervised by the endoscopist. The following parameters were       monitored: oxygen saturation, heart rate, blood pressure, CO2       capnography and response to care. Total physician intraservice time was       17 minutes. Recommendation:           - Patient has a contact number available for                            emergencies. The signs and symptoms of potential                            delayed complications were discussed with the                             patient. Return to normal activities tomorrow.                            Written discharge instructions were provided to the                            patient.                           - Resume previous diet today.                           - Continue present medications.                           - Resume aspirin at prior dose in 10 days.                           - Await pathology results. Procedure Code(s):        --- Professional ---                           [phone], Esophagogastroduodenoscopy, flexible,                            transoral; with biopsy, single or multiple                           99152, Moderate sedation services provided by the                            same physician or other qualified health care  professional performing the diagnostic or                            therapeutic service that the sedation supports,                            requiring the presence of an independent trained                            observer to assist in the monitoring of the                            patient's level of consciousness and physiological                            status; initial 15 minutes of intraservice time,                            patient age 61 years or older Diagnosis Code(s):        --- Professional ---                           K31.7, Polyp of stomach and duodenum                           D50.0, Iron deficiency anemia secondary to blood                            loss (chronic)                           R19.5, Other fecal abnormalities CPT copyright 2016 American [employer]. All rights reserved. The codes documented in this report are preliminary and upon coder review may  be revised to meet current compliance requirements. Hildred Laser, MD Hildred Laser, MD 06/26/2015 4:32:49 PM This report has been signed electronically. Number of Addenda: 0

## 2015-06-26 NOTE — Op Note (Signed)
[facility] Patient Name: [name] Procedure Date: 06/26/2015 3:21 PM MRN: [id number] Date of Birth: 27-Jun-1950 Attending MD: Hildred Laser , MD CSN: [id number] Age: 65 Admit Type: Outpatient Procedure:                Colonoscopy Indications:              Iron deficiency anemia secondary to chronic blood                            loss Providers:                Hildred Laser, MD, Lurline Del, RN, Georgeann Oppenheim,                            Technician Referring MD:             Teodora Medici. Kim Medicines:                Meperidine 50 mg IV, Midazolam 4 mg IV Complications:            No immediate complications. Estimated Blood Loss:     Estimated blood loss: none. Procedure:                Pre-Anesthesia Assessment:                           - Prior to the procedure, a History and Physical                            was performed, and patient medications and                            allergies were reviewed. The patient's tolerance of                            previous anesthesia was also reviewed. The risks                            and benefits of the procedure and the sedation                            options and risks were discussed with the patient.                            All questions were answered, and informed consent                            was obtained. Prior Anticoagulants: The patient                            last took aspirin 2 days prior to the procedure.                            ASA Grade Assessment: III - A patient with severe  systemic disease. After reviewing the risks and                            benefits, the patient was deemed in satisfactory                            condition to undergo the procedure.                           After obtaining informed consent, the colonoscope                            was passed under direct vision. Throughout the                            procedure, the patient's blood pressure,  pulse, and                            oxygen saturations were monitored continuously. The                            [id number] ([id number]) scope was introduced through                            the anus and advanced to the the cecum, identified                            by appendiceal orifice and ileocecal valve. The                            colonoscopy was performed without difficulty. The                            patient tolerated the procedure well. The quality                            of the bowel preparation was adequate to identify                            polyps. The ileocecal valve, appendiceal orifice,                            and rectum were photographed. Scope In: 3:23:10 PM Scope Out: 4:21:03 PM Scope Withdrawal Time: 0 hours 42 minutes 27 seconds  Total Procedure Duration: 0 hours 57 minutes 53 seconds  Findings:      A 4 mm polyp was found in the cecum. The polyp was sessile. The polyp       was removed with a cold snare. Resection and retrieval were complete.      A 15 mm polyp was found in the ileocecal valve. The polyp was       semi-pedunculated. The polyp was removed with a hot snare. Resection and       retrieval were complete.      Ten sessile and semi-pedunculated polyps were found in the  hepatic       flexure and ascending colon. The polyps were 7 to 10 mm in size. These       polyps were removed with a hot snare. Resection and retrieval were       complete.      No additional abnormalities were found on retroflexion. Impression:               - One 4 mm polyp in the cecum, removed with a cold                            snare. Resected and retrieved. this polyp was                            submitted with other polyps that were removed from                            ascending colon and hepatic flexure.                           - One 15 mm polyp at the ileocecal valve, removed                            with a hot snare. Resected and retrieved.                            - Eleven 7 to 10 mm polyps at the hepatic flexure                            and in the ascending colon, removed with a hot                            snare. Resected and retrieved. these polyps were                            submitted together. Moderate Sedation:      Moderate (conscious) sedation was administered by the endoscopy nurse       and supervised by the endoscopist. The following parameters were       monitored: oxygen saturation, heart rate, blood pressure, CO2       capnography and response to care. Total physician intraservice time was       63 minutes. Recommendation:           - Patient has a contact number available for                            emergencies. The signs and symptoms of potential                            delayed complications were discussed with the                            patient. Return to normal activities tomorrow.  Written discharge instructions were provided to the                            patient.                           - Resume previous diet today.                           - Continue present medications.                           - Resume aspirin at prior dose in 10 days.                           - Await pathology results.                           - check H&H today.                           - Repeat colonoscopy for surveillance based on                            pathology results. Procedure Code(s):        --- Professional ---                           [phone], Colonoscopy, flexible; with removal of                            tumor(s), polyp(s), or other lesion(s) by snare                            technique                           99152, Moderate sedation services provided by the                            same physician or other qualified health care                            professional performing the diagnostic or                            therapeutic service that the sedation  supports,                            requiring the presence of an independent trained                            observer to assist in the monitoring of the                            patient's level of consciousness and physiological  status; initial 15 minutes of intraservice time,                            patient age 57 years or older                           [phone], Moderate sedation services; each additional                            15 minutes intraservice time                           [phone], Moderate sedation services; each additional                            15 minutes intraservice time                           99153, Moderate sedation services; each additional                            15 minutes intraservice time Diagnosis Code(s):        --- Professional ---                           D12.0, Benign neoplasm of cecum                           D12.3, Benign neoplasm of transverse colon (hepatic                            flexure or splenic flexure)                           D12.2, Benign neoplasm of ascending colon                           D50.0, Iron deficiency anemia secondary to blood                            loss (chronic) CPT copyright 2016 American [employer] Association. All rights reserved. The codes documented in this report are preliminary and upon coder review may  be revised to meet current compliance requirements. Hildred Laser, MD Hildred Laser, MD 06/26/2015 4:39:21 PM This report has been signed electronically. Number of Addenda: 0

## 2015-06-26 NOTE — Discharge Instructions (Signed)
No aspirin or NSAIDs for 10 days. Resume usual medications including iron pills. Resume usual diet. No driving for 24 hours. Physician will call with biopsy results   .Colonoscopy, Care After Refer to this sheet in the next few weeks. These instructions provide you with information on caring for yourself after your procedure. Your health care provider may also give you more specific instructions. Your treatment has been planned according to current medical practices, but problems sometimes occur. Call your health care provider if you have any problems or questions after your procedure. WHAT TO EXPECT AFTER THE PROCEDURE  After your procedure, it is typical to have the following:  A small amount of blood in your stool.  Moderate amounts of gas and mild abdominal cramping or bloating. HOME CARE INSTRUCTIONS  Do not drive, operate machinery, or sign important documents for 24 hours.  You may shower and resume your regular physical activities, but move at a slower pace for the first 24 hours.  Take frequent rest periods for the first 24 hours.  Walk around or put a warm pack on your abdomen to help reduce abdominal cramping and bloating.  Drink enough fluids to keep your urine clear or pale yellow.  You may resume your normal diet as instructed by your health care provider. Avoid heavy or fried foods that are hard to digest.  Avoid drinking alcohol for 24 hours or as instructed by your health care provider.  Only take over-the-counter or prescription medicines as directed by your health care provider.  If a tissue sample (biopsy) was taken during your procedure:  Do not take aspirin or blood thinners for 7 days, or as instructed by your health care provider.  Do not drink alcohol for 7 days, or as instructed by your health care provider.  Eat soft foods for the first 24 hours. SEEK MEDICAL CARE IF: You have persistent spotting of blood in your stool 2-3 days after the  procedure. SEEK IMMEDIATE MEDICAL CARE IF:  You have more than a small spotting of blood in your stool.  You pass large blood clots in your stool.  Your abdomen is swollen (distended).  You have nausea or vomiting.  You have a fever.  You have increasing abdominal pain that is not relieved with medicine.   This information is not intended to replace advice given to you by your health care provider. Make sure you discuss any questions you have with your health care provider.   Document Released: 09/15/2003 Document Revised: 11/21/2012 Document Reviewed: 10/08/2012 Elsevier Interactive Patient Education 2016 [employer].     Esophagogastroduodenoscopy, Care After Refer to this sheet in the next few weeks. These instructions provide you with information about caring for yourself after your procedure. Your health care provider may also give you more specific instructions. Your treatment has been planned according to current medical practices, but problems sometimes occur. Call your health care provider if you have any problems or questions after your procedure. WHAT TO EXPECT AFTER THE PROCEDURE After your procedure, it is typical to feel:  Soreness in your throat.  Pain with swallowing.  Sick to your stomach (nauseous).  Bloated.  Dizzy.  Fatigued. HOME CARE INSTRUCTIONS  Do not eat or drink anything until the numbing medicine (local anesthetic) has worn off and your gag reflex has returned. You will know that the local anesthetic has worn off when you can swallow comfortably.  Do not drive or operate machinery until directed by your health care provider.  Take  medicines only as directed by your health care provider. SEEK MEDICAL CARE IF:   You cannot stop coughing.  You are not urinating at all or less than usual. SEEK IMMEDIATE MEDICAL CARE IF:  You have difficulty swallowing.  You cannot eat or drink.  You have worsening throat or chest pain.  You have  dizziness or lightheadedness or you faint.  You have nausea or vomiting.  You have chills.  You have a fever.  You have severe abdominal pain.  You have black, tarry, or bloody stools.   This information is not intended to replace advice given to you by your health care provider. Make sure you discuss any questions you have with your health care provider.   Document Released: 01/18/2012 Document Revised: 02/21/2014 Document Reviewed: 01/18/2012 Elsevier Interactive Patient Education 2016 [employer]  Colon Polyps Polyps are lumps of extra tissue growing inside the body. Polyps can grow in the large intestine (colon). Most colon polyps are noncancerous (benign). However, some colon polyps can become cancerous over time. Polyps that are larger than a pea may be harmful. To be safe, caregivers remove and test all polyps. CAUSES  Polyps form when mutations in the genes cause your cells to grow and divide even though no more tissue is needed. RISK FACTORS There are a number of risk factors that can increase your chances of getting colon polyps. They include:  Being older than 50 years.  Family history of colon polyps or colon cancer.  Long-term colon diseases, such as colitis or Crohn disease.  Being overweight.  Smoking.  Being inactive.  Drinking too much alcohol. SYMPTOMS  Most small polyps do not cause symptoms. If symptoms are present, they may include:  Blood in the stool. The stool may look dark red or black.  Constipation or diarrhea that lasts longer than 1 week. DIAGNOSIS People often do not know they have polyps until their caregiver finds them during a regular checkup. Your caregiver can use 4 tests to check for polyps:  Digital rectal exam. The caregiver wears gloves and feels inside the rectum. This test would find polyps only in the rectum.  Barium enema. The caregiver puts a liquid called barium into your rectum before taking X-rays of your colon. Barium  makes your colon look white. Polyps are dark, so they are easy to see in the X-ray pictures.  Sigmoidoscopy. A thin, flexible tube (sigmoidoscope) is placed into your rectum. The sigmoidoscope has a light and tiny camera in it. The caregiver uses the sigmoidoscope to look at the last third of your colon.  Colonoscopy. This test is like sigmoidoscopy, but the caregiver looks at the entire colon. This is the most common method for finding and removing polyps. TREATMENT  Any polyps will be removed during a sigmoidoscopy or colonoscopy. The polyps are then tested for cancer. PREVENTION  To help lower your risk of getting more colon polyps:  Eat plenty of fruits and vegetables. Avoid eating fatty foods.  Do not smoke.  Avoid drinking alcohol.  Exercise every day.  Lose weight if recommended by your caregiver.  Eat plenty of calcium and folate. Foods that are rich in calcium include milk, cheese, and broccoli. Foods that are rich in folate include chickpeas, kidney beans, and spinach. HOME CARE INSTRUCTIONS Keep all follow-up appointments as directed by your caregiver. You may need periodic exams to check for polyps. SEEK MEDICAL CARE IF: You notice bleeding during a bowel movement.   This information is not intended to  replace advice given to you by your health care provider. Make sure you discuss any questions you have with your health care provider.   Document Released: 10/28/2003 Document Revised: 02/21/2014 Document Reviewed: 04/12/2011 Elsevier Interactive Patient Education [employer].

## 2015-06-26 NOTE — H&P (Addendum)
[name] is an 65 y.o. female.   Chief Complaint: Patient is here for EGD and colonoscopy. HPI: 65 year old Caucasian female with multiple medical problems was chronic GERD as well as history of colonic adenomas whose last colonoscopy was in October 2012 was recently found to have iron deficiency anemia and heme positive stool. She is on full dose aspirin. She denies melena or rectal bleeding. She has history of IBS. She takes fiber supplement and probiotic and her bowels seem to be more regular. She has 2-3 per day. She has good appetite and has not lost any weight. She remains on Medrol for rheumatoid arthritis. She also has stomatitis. She was treated for yeast infection but incomplete resolution of her symptoms. She has been chewing ice. Hemoglobin 3 weeks ago was 9.3  Family History is negative for CRC.    Past Medical History  Diagnosis Date  . PONV (postoperative nausea and vomiting)   . Hypertension   . Angina   . Hypercholesterolemia   . Numbness and tingling in right hand   . GERD (gastroesophageal reflux disease)   . Constipation   . Diarrhea   . Colon polyps   . Arthritis     Rheumatoid  . Cancer ([place])     left breast, cervical cancer  . Rheumatoid arthritis with rheumatoid factor (HCC)   . Right knee DJD   . Heart murmur   . Stroke [facility])     jan 2012  . Peripheral vascular disease ([place])     cerebrel aneurysm /sp coil  . Anemia     Past Surgical History  Procedure Laterality Date  . Right brain aneurysm  04/2010    Coiled   . Abdominal hysterectomy  1977  . Cholecystectomy    . Bilateral oophorectomy    . Left knee arthroscopy    . Right wrist surgery  1999    synevectomy  . Left breast lumpectomy  2004  . Appendectomy  1964  . Colonoscopy  12/09/2010    Procedure: COLONOSCOPY;  Surgeon: Rogene Houston, MD;  [facility];  Service: Endoscopy;  Laterality: N/A;  . Total knee arthroplasty Right 02/25/2013    Procedure: TOTAL KNEE  ARTHROPLASTY;  Surgeon: Lorn Junes, MD;  Location: [facility];  Service: Orthopedics;  Laterality: Right;    Family History  Problem Relation Age of Onset  . Colon cancer Neg Hx   . Hypertension Father   . Heart attack Father    Social History:  reports that she quit smoking about 31 years ago. She has never used smokeless tobacco. She reports that she does not drink alcohol or use illicit drugs.  Allergies:  Allergies  Allergen Reactions  . Aurothioglucose [Solganal] Other (See Comments)    REACTION: VASCULITIS   . Calcium Channel Blockers Palpitations  . Feldene [Piroxicam] Anaphylaxis and Other (See Comments)    SKIN BLISTERS  . Hydralazine Other (See Comments)    REACTION: Chest pains and headaches: IV only  . Imdur [Isosorbide Mononitrate] Other (See Comments)    REACTION: PATIENT IS UNABLE TO WALK OR FUNCTION  . Imuran [Azathioprine]     Could not walk or function  . Methotrexate Derivatives Other (See Comments)    REACTION: VASCULITIS  . Neomycin Rash  . Nickel Rash  . Thimerosal Other (See Comments)    SKIN BLISTERED  . Percocet [Oxycodone-Acetaminophen] Other (See Comments)    REACTION : INSOMNIA  . Vioxx [Rofecoxib] Other (See Comments)    REACTION:  CAUSED MORE JOINT PAIN  . Arava [Leflunomide]     REACTION: UNKNOWN  . Asacol [Mesalamine] Other (See Comments)    REACTION: UNKNOWN  . Hydrocodone Itching  . Iohexol      Desc: PT STATES SHE WAS TOLD NEVER TO HAVE CT CONTRAST AGAIN.  SHE HAD SOME KIND OF REACTION BUT DOESNT REMEBER WHAT HAPPENED   . Sulfa Antibiotics     UNKNOWN  . Latex Rash  . Nsaids Other (See Comments)    GI UPSET , CAN TOLERATE SOME NSAIDS    Medications Prior to Admission  Medication Sig Dispense Refill  . allopurinol (ZYLOPRIM) 100 MG tablet Take 300 mg by mouth daily.     Marland Kitchen aspirin 325 MG tablet Take 325 mg by mouth daily.    . Calcium Carbonate-Vit D-Min (CALTRATE 600+D PLUS) 600-400 MG-UNIT per tablet Chew 1 tablet by mouth 2  (two) times daily.      . Certolizumab Pegol (CIMZIA PREFILLED) 2 X 200 MG/ML KIT Inject 200 mg into the skin every 30 (thirty) days.    . cetirizine (ZYRTEC) 10 MG tablet Take 10 mg by mouth daily.    . Cholecalciferol (VITAMIN D) 2000 UNITS tablet Take 2,000 Units by mouth daily.     . DULoxetine (CYMBALTA) 60 MG capsule Take 60 mg by mouth daily.    . ferrous sulfate 325 (65 FE) MG tablet Take 325 mg by mouth daily with breakfast.    . FIBER SELECT GUMMIES PO Take 2 each by mouth daily.     . hydroxychloroquine (PLAQUENIL) 200 MG tablet Take 200 mg by mouth daily.     Marland Kitchen losartan (COZAAR) 50 MG tablet Take 100 mg by mouth daily.     . magnesium oxide (MAG-OX) 400 MG tablet Take 400 mg by mouth daily.      . Nebivolol HCl (BYSTOLIC) 20 MG TABS Take 20 mg by mouth daily.     Marland Kitchen omeprazole (PRILOSEC) 40 MG capsule TAKE (1) CAPSULE BY MOUTH ONCE DAILY. 30 capsule 11  . polyethylene glycol-electrolytes (NULYTELY/GOLYTELY) 420 g solution Take 4,000 mLs by mouth once. 4000 mL 0  . traMADol (ULTRAM) 50 MG tablet Take 50 mg by mouth as needed for moderate pain.     Vladimir Faster Glycol-Propyl Glycol (SYSTANE) 0.4-0.3 % GEL Apply 1 application to eye daily as needed. For dry eye relief      No results found for this or any previous visit (from the past 48 hour(s)). No results found.  ROS  Blood pressure 149/59, pulse 65, temperature 98.6 F (37 C), temperature source Oral, resp. rate 18, height 5' 7.5" (1.715 m), weight 195 lb (88.451 kg), SpO2 100 %. Physical Exam  Constitutional:  Well-developed well-nourished Caucasian female with round facies  HENT:  Few tiny erosions noted along the inner aspect of lower lip and gums  Eyes: Pupils are equal, round, and reactive to light. No scleral icterus.  Neck: No thyromegaly present.  Cardiovascular: Normal rate.   Murmur heard. Respiratory: Effort normal and breath sounds normal. Rales: faint systolic ejection murmur at left sternal border.  GI:   Abdomen is full. Abdomen is soft mild superficial tenderness more pronounced in right lower quadrant but also mid and left side. No organomegaly or masses.  Musculoskeletal: She exhibits no edema.  Lymphadenopathy:    She has no cervical adenopathy.  Neurological: She is alert.  Skin: Skin is warm and dry.     Assessment/Plan Iron deficiency anemia and heme positive stool. Chronic  GERD and history of colonic adenomas. Patient is on full dose aspirin. Diagnostic EGD and colonoscopy.   Rogene Houston, MD 06/26/2015, 2:49 PM

## 2015-07-02 ENCOUNTER — Encounter (HOSPITAL_COMMUNITY): Payer: Self-pay | Admitting: Internal Medicine

## 2015-07-02 DIAGNOSIS — M0589 Other rheumatoid arthritis with rheumatoid factor of multiple sites: Secondary | ICD-10-CM | POA: Diagnosis not present

## 2015-07-03 ENCOUNTER — Telehealth (INDEPENDENT_AMBULATORY_CARE_PROVIDER_SITE_OTHER): Payer: Self-pay | Admitting: *Deleted

## 2015-07-03 ENCOUNTER — Encounter (INDEPENDENT_AMBULATORY_CARE_PROVIDER_SITE_OTHER): Payer: Self-pay | Admitting: *Deleted

## 2015-07-03 DIAGNOSIS — D649 Anemia, unspecified: Secondary | ICD-10-CM

## 2015-07-03 NOTE — Telephone Encounter (Signed)
Per Dr.Rehman the patient will need to have labs drawn in 1 month. Also 1 hemoccult card.

## 2015-07-15 DIAGNOSIS — Z09 Encounter for follow-up examination after completed treatment for conditions other than malignant neoplasm: Secondary | ICD-10-CM | POA: Diagnosis not present

## 2015-07-15 DIAGNOSIS — Z79899 Other long term (current) drug therapy: Secondary | ICD-10-CM | POA: Diagnosis not present

## 2015-07-15 DIAGNOSIS — M059 Rheumatoid arthritis with rheumatoid factor, unspecified: Secondary | ICD-10-CM | POA: Diagnosis not present

## 2015-07-15 DIAGNOSIS — H25813 Combined forms of age-related cataract, bilateral: Secondary | ICD-10-CM | POA: Diagnosis not present

## 2015-07-20 DIAGNOSIS — Z1231 Encounter for screening mammogram for malignant neoplasm of breast: Secondary | ICD-10-CM | POA: Diagnosis not present

## 2015-07-22 DIAGNOSIS — Z131 Encounter for screening for diabetes mellitus: Secondary | ICD-10-CM | POA: Diagnosis not present

## 2015-07-22 DIAGNOSIS — I10 Essential (primary) hypertension: Secondary | ICD-10-CM | POA: Diagnosis not present

## 2015-07-22 DIAGNOSIS — R739 Hyperglycemia, unspecified: Secondary | ICD-10-CM | POA: Diagnosis not present

## 2015-07-22 DIAGNOSIS — M1000 Idiopathic gout, unspecified site: Secondary | ICD-10-CM | POA: Diagnosis not present

## 2015-07-27 DIAGNOSIS — I6521 Occlusion and stenosis of right carotid artery: Secondary | ICD-10-CM | POA: Diagnosis not present

## 2015-07-27 DIAGNOSIS — E7800 Pure hypercholesterolemia, unspecified: Secondary | ICD-10-CM | POA: Diagnosis not present

## 2015-07-27 DIAGNOSIS — R739 Hyperglycemia, unspecified: Secondary | ICD-10-CM | POA: Diagnosis not present

## 2015-07-27 DIAGNOSIS — I10 Essential (primary) hypertension: Secondary | ICD-10-CM | POA: Diagnosis not present

## 2015-08-03 ENCOUNTER — Telehealth (INDEPENDENT_AMBULATORY_CARE_PROVIDER_SITE_OTHER): Payer: Self-pay | Admitting: *Deleted

## 2015-08-03 DIAGNOSIS — D649 Anemia, unspecified: Secondary | ICD-10-CM | POA: Diagnosis not present

## 2015-08-03 DIAGNOSIS — M0589 Other rheumatoid arthritis with rheumatoid factor of multiple sites: Secondary | ICD-10-CM | POA: Diagnosis not present

## 2015-08-03 NOTE — Telephone Encounter (Signed)
   Diagnosis:    Result(s)   Card 1: Positive           Completed by: Nihar Klus,LPN   HEMOCCULT SENSA DEVELOPER: LOT#:  [id number] EXPIRATION DATE: 9-17   HEMOCCULT SENSA CARD:  LOT#:  [id number] EXPIRATION DATE: 07/18   CARD CONTROL RESULTS:  POSITIVE: Positive  NEGATIVE: Negative    ADDITIONAL COMMENTS: Patient was made aware of her results.

## 2015-08-04 LAB — HEMOGLOBIN AND HEMATOCRIT, BLOOD
HEMATOCRIT: 37.5 % (ref 35.0–45.0)
HEMOGLOBIN: 11.5 g/dL — AB (ref 11.7–15.5)

## 2015-08-05 NOTE — Telephone Encounter (Signed)
Culture remains positive. Proceed with given capsule study.

## 2015-08-06 ENCOUNTER — Other Ambulatory Visit (INDEPENDENT_AMBULATORY_CARE_PROVIDER_SITE_OTHER): Payer: Self-pay | Admitting: *Deleted

## 2015-08-06 ENCOUNTER — Encounter (INDEPENDENT_AMBULATORY_CARE_PROVIDER_SITE_OTHER): Payer: Self-pay | Admitting: *Deleted

## 2015-08-06 DIAGNOSIS — R195 Other fecal abnormalities: Secondary | ICD-10-CM

## 2015-08-06 DIAGNOSIS — D509 Iron deficiency anemia, unspecified: Secondary | ICD-10-CM

## 2015-08-20 ENCOUNTER — Encounter (HOSPITAL_COMMUNITY): Payer: Self-pay | Admitting: *Deleted

## 2015-08-20 ENCOUNTER — Ambulatory Visit (HOSPITAL_COMMUNITY)
Admission: RE | Admit: 2015-08-20 | Discharge: 2015-08-20 | Disposition: A | Discharge status: Home / Self Care | Payer: Medicare Other | Source: Ambulatory Visit | Attending: Internal Medicine | Admitting: Internal Medicine

## 2015-08-20 ENCOUNTER — Encounter (HOSPITAL_COMMUNITY)
Admission: RE | Disposition: A | Discharge status: Home / Self Care | Payer: Self-pay | Source: Ambulatory Visit | Attending: Internal Medicine

## 2015-08-20 DIAGNOSIS — I739 Peripheral vascular disease, unspecified: Secondary | ICD-10-CM | POA: Diagnosis not present

## 2015-08-20 DIAGNOSIS — R011 Cardiac murmur, unspecified: Secondary | ICD-10-CM | POA: Diagnosis not present

## 2015-08-20 DIAGNOSIS — K5900 Constipation, unspecified: Secondary | ICD-10-CM | POA: Insufficient documentation

## 2015-08-20 DIAGNOSIS — Z90710 Acquired absence of both cervix and uterus: Secondary | ICD-10-CM | POA: Insufficient documentation

## 2015-08-20 DIAGNOSIS — Z9049 Acquired absence of other specified parts of digestive tract: Secondary | ICD-10-CM | POA: Insufficient documentation

## 2015-08-20 DIAGNOSIS — Z86010 Personal history of colonic polyps: Secondary | ICD-10-CM | POA: Diagnosis not present

## 2015-08-20 DIAGNOSIS — Z882 Allergy status to sulfonamides status: Secondary | ICD-10-CM | POA: Diagnosis not present

## 2015-08-20 DIAGNOSIS — Z87891 Personal history of nicotine dependence: Secondary | ICD-10-CM | POA: Diagnosis not present

## 2015-08-20 DIAGNOSIS — Z8673 Personal history of transient ischemic attack (TIA), and cerebral infarction without residual deficits: Secondary | ICD-10-CM | POA: Insufficient documentation

## 2015-08-20 DIAGNOSIS — Z96651 Presence of right artificial knee joint: Secondary | ICD-10-CM | POA: Diagnosis not present

## 2015-08-20 DIAGNOSIS — Z888 Allergy status to other drugs, medicaments and biological substances status: Secondary | ICD-10-CM | POA: Diagnosis not present

## 2015-08-20 DIAGNOSIS — Z8249 Family history of ischemic heart disease and other diseases of the circulatory system: Secondary | ICD-10-CM | POA: Insufficient documentation

## 2015-08-20 DIAGNOSIS — R195 Other fecal abnormalities: Secondary | ICD-10-CM

## 2015-08-20 DIAGNOSIS — I10 Essential (primary) hypertension: Secondary | ICD-10-CM | POA: Diagnosis not present

## 2015-08-20 DIAGNOSIS — Z7982 Long term (current) use of aspirin: Secondary | ICD-10-CM | POA: Diagnosis not present

## 2015-08-20 DIAGNOSIS — Z853 Personal history of malignant neoplasm of breast: Secondary | ICD-10-CM | POA: Insufficient documentation

## 2015-08-20 DIAGNOSIS — D500 Iron deficiency anemia secondary to blood loss (chronic): Secondary | ICD-10-CM | POA: Diagnosis not present

## 2015-08-20 DIAGNOSIS — Z79899 Other long term (current) drug therapy: Secondary | ICD-10-CM | POA: Diagnosis not present

## 2015-08-20 DIAGNOSIS — D509 Iron deficiency anemia, unspecified: Secondary | ICD-10-CM

## 2015-08-20 DIAGNOSIS — I209 Angina pectoris, unspecified: Secondary | ICD-10-CM | POA: Diagnosis not present

## 2015-08-20 DIAGNOSIS — Q2733 Arteriovenous malformation of digestive system vessel: Secondary | ICD-10-CM | POA: Insufficient documentation

## 2015-08-20 DIAGNOSIS — K633 Ulcer of intestine: Secondary | ICD-10-CM | POA: Insufficient documentation

## 2015-08-20 DIAGNOSIS — M1711 Unilateral primary osteoarthritis, right knee: Secondary | ICD-10-CM | POA: Insufficient documentation

## 2015-08-20 DIAGNOSIS — M069 Rheumatoid arthritis, unspecified: Secondary | ICD-10-CM | POA: Insufficient documentation

## 2015-08-20 DIAGNOSIS — K219 Gastro-esophageal reflux disease without esophagitis: Secondary | ICD-10-CM | POA: Diagnosis not present

## 2015-08-20 DIAGNOSIS — Z91040 Latex allergy status: Secondary | ICD-10-CM | POA: Diagnosis not present

## 2015-08-20 DIAGNOSIS — K921 Melena: Secondary | ICD-10-CM | POA: Diagnosis not present

## 2015-08-20 DIAGNOSIS — R197 Diarrhea, unspecified: Secondary | ICD-10-CM | POA: Insufficient documentation

## 2015-08-20 DIAGNOSIS — Z8541 Personal history of malignant neoplasm of cervix uteri: Secondary | ICD-10-CM | POA: Diagnosis not present

## 2015-08-20 DIAGNOSIS — K31819 Angiodysplasia of stomach and duodenum without bleeding: Secondary | ICD-10-CM | POA: Diagnosis not present

## 2015-08-20 DIAGNOSIS — E7800 Pure hypercholesterolemia, unspecified: Secondary | ICD-10-CM | POA: Diagnosis not present

## 2015-08-20 DIAGNOSIS — Z9889 Other specified postprocedural states: Secondary | ICD-10-CM | POA: Diagnosis not present

## 2015-08-20 HISTORY — PX: GIVENS CAPSULE STUDY: SHX5432

## 2015-08-20 SURGERY — IMAGING PROCEDURE, GI TRACT, INTRALUMINAL, VIA CAPSULE

## 2015-08-20 MED ORDER — SODIUM CHLORIDE 0.9 % IV SOLN: INTRAVENOUS | Status: DC

## 2015-08-20 NOTE — H&P (Signed)
[name] is an 65 y.o. female.   Chief Complaint: Patient is here for small bowel given capsule study. HPI:  65 year old Caucasian female with multiple medical problems who was evaluated about 2 months ago for iron deficiency anemia and heme-positive stool. She had esophagogastroduodenoscopy as well as colonoscopy. She had multiple polyps removed from her colon but no bleeding lesion was identified in upper and lower GI tract. She is therefore returning for this study to complete her workup.  Past Medical History  Diagnosis Date  . PONV (postoperative nausea and vomiting)   . Hypertension   . Angina   . Hypercholesterolemia   . Numbness and tingling in right hand   . GERD (gastroesophageal reflux disease)   . Constipation   . Diarrhea   . Colon polyps   . Arthritis     Rheumatoid  . Cancer ([place])     left breast, cervical cancer  . Rheumatoid arthritis with rheumatoid factor (HCC)   . Right knee DJD   . Heart murmur   . Stroke [facility])     jan 2012  . Peripheral vascular disease ([place])     cerebrel aneurysm /sp coil  . Anemia     Past Surgical History  Procedure Laterality Date  . Right brain aneurysm  04/2010    Coiled   . Abdominal hysterectomy  1977  . Cholecystectomy    . Bilateral oophorectomy    . Left knee arthroscopy    . Right wrist surgery  1999    synevectomy  . Left breast lumpectomy  2004  . Appendectomy  1964  . Colonoscopy  12/09/2010    Procedure: COLONOSCOPY;  Surgeon: Rogene Houston, MD;  [facility];  Service: Endoscopy;  Laterality: N/A;  . Total knee arthroplasty Right 02/25/2013    Procedure: TOTAL KNEE ARTHROPLASTY;  Surgeon: Lorn Junes, MD;  Location: [place];  Service: Orthopedics;  Laterality: Right;  . Colonoscopy N/A 06/26/2015    Procedure: COLONOSCOPY;  Surgeon: Rogene Houston, MD;  [facility];  Service: Endoscopy;  Laterality: N/A;  1:05-moved to [address] to notify pt  . Esophagogastroduodenoscopy N/A 06/26/2015     Procedure: ESOPHAGOGASTRODUODENOSCOPY (EGD);  Surgeon: Rogene Houston, MD;  [facility];  Service: Endoscopy;  Laterality: N/A;    Family History  Problem Relation Age of Onset  . Colon cancer Neg Hx   . Hypertension Father   . Heart attack Father    Social History:  reports that she quit smoking about 31 years ago. She has never used smokeless tobacco. She reports that she does not drink alcohol or use illicit drugs.  Allergies:  Allergies  Allergen Reactions  . Aurothioglucose [Solganal] Other (See Comments)    REACTION: VASCULITIS   . Calcium Channel Blockers Palpitations  . Feldene [Piroxicam] Anaphylaxis and Other (See Comments)    SKIN BLISTERS  . Hydralazine Other (See Comments)    REACTION: Chest pains and headaches: IV only  . Imdur [Isosorbide Mononitrate] Other (See Comments)    REACTION: PATIENT IS UNABLE TO WALK OR FUNCTION  . Imuran [Azathioprine]     Could not walk or function  . Methotrexate Derivatives Other (See Comments)    REACTION: VASCULITIS  . Neomycin Rash  . Nickel Rash  . Thimerosal Other (See Comments)    SKIN BLISTERED  . Percocet [Oxycodone-Acetaminophen] Other (See Comments)    REACTION : INSOMNIA  . Vioxx [Rofecoxib] Other (See Comments)    REACTION:  CAUSED MORE JOINT PAIN  . Arava [Leflunomide]     REACTION: UNKNOWN  . Asacol [Mesalamine] Other (See Comments)    REACTION: UNKNOWN  . Hydrocodone Itching  . Iohexol      Desc: PT STATES SHE WAS TOLD NEVER TO HAVE CT CONTRAST AGAIN.  SHE HAD SOME KIND OF REACTION BUT DOESNT REMEBER WHAT HAPPENED   . Sulfa Antibiotics     UNKNOWN  . Latex Rash  . Nsaids Other (See Comments)    GI UPSET , CAN TOLERATE SOME NSAIDS    Medications Prior to Admission  Medication Sig Dispense Refill  . allopurinol (ZYLOPRIM) 100 MG tablet Take 300 mg by mouth daily.     Marland Kitchen aspirin 325 MG tablet Take 1 tablet (325 mg total) by mouth daily.    . Calcium Carbonate-Vit D-Min (CALTRATE 600+D PLUS)  600-400 MG-UNIT per tablet Chew 1 tablet by mouth 2 (two) times daily.      . Certolizumab Pegol (CIMZIA PREFILLED) 2 X 200 MG/ML KIT Inject 200 mg into the skin every 30 (thirty) days.    . cetirizine (ZYRTEC) 10 MG tablet Take 10 mg by mouth daily.    . Cholecalciferol (VITAMIN D) 2000 UNITS tablet Take 2,000 Units by mouth daily.     . DULoxetine (CYMBALTA) 60 MG capsule Take 60 mg by mouth daily.    . ferrous sulfate 325 (65 FE) MG tablet Take 325 mg by mouth daily with breakfast.    . FIBER SELECT GUMMIES PO Take 2 each by mouth daily.     . hydroxychloroquine (PLAQUENIL) 200 MG tablet Take 200 mg by mouth daily.     Marland Kitchen losartan (COZAAR) 50 MG tablet Take 100 mg by mouth daily.     . magnesium oxide (MAG-OX) 400 MG tablet Take 400 mg by mouth daily.      . Nebivolol HCl (BYSTOLIC) 20 MG TABS Take 20 mg by mouth daily.     Marland Kitchen omeprazole (PRILOSEC) 40 MG capsule TAKE (1) CAPSULE BY MOUTH ONCE DAILY. 30 capsule 11  . Polyethyl Glycol-Propyl Glycol (SYSTANE) 0.4-0.3 % GEL Apply 1 application to eye daily as needed. For dry eye relief    . polyethylene glycol-electrolytes (NULYTELY/GOLYTELY) 420 g solution Take 4,000 mLs by mouth once. 4000 mL 0  . traMADol (ULTRAM) 50 MG tablet Take 50 mg by mouth as needed for moderate pain.       No results found for this or any previous visit (from the past 48 hour(s)). No results found.  ROS  Height '5\' 7"'$  (1.702 m), weight 199 lb 5 oz (90.408 kg). Physical Exam   Assessment/Plan Iron deficiency anemia due to GI bleed of occult origin. Small bowel given capsule study.  Hildred Laser, MD 08/20/2015, 10:59 PM

## 2015-08-23 ENCOUNTER — Telehealth (INDEPENDENT_AMBULATORY_CARE_PROVIDER_SITE_OTHER): Payer: Self-pay | Admitting: Internal Medicine

## 2015-08-23 NOTE — Telephone Encounter (Signed)
Patient was called to discuss results of given capsule study. She will continue by mouth iron. H&H and Hemoccult in one month.

## 2015-08-23 NOTE — Op Note (Signed)
Small Bowel [place] Capsule Study Procedure date:  08/20/2015  Referring Provider:  Dr. Jani Gravel, MD PCP:  Dr. Jani Gravel, MD  Indication for procedure:   Patient is 65 year old Caucasian female with multiple medical problems including chronic GERD who is maintained on PPI was recently found to have iron deficiency anemia and heme positive stool. She underwent EGD and colonoscopy. No bleeding lesion was identified but she had multiple adenomas removed from her colon most of which are located in the right colon.  She is undergoing small bowel Given capsule study to complete her GI workup.    Findings:   Patient was able to swallow Given capsule without any difficulty. Study duration 7:30:23(hours minutes and seconds). Multiple punctate telangiectasia noted involving duodenal mucosa without stigmata of bleed(frames at 00:19:33; 00:19:37 and 00:19:38) Single AV malformation noted involving cecal mucosa(frame at 04:20:33) Three small ulcers noted at cecum/ascending colon felt to be polypectomy sites. Another prominent ulcer noted covered with blood at cecum and/or ascending colon without pooling or active bleeding. This may also be polypectomy site.  First Gastric image:  00:00:27 First Duodenal image: 00:18:14 First Ileo-Cecal Valve image: 03:59:56 First Cecal image: 04:00:00 Gastric Passage time: 18 m Small Bowel Passage time:  3 h 42 m  Summary & Recommendations: Punctate duodenal telangiectasia without stigmata of bleed otherwise normal small bowel mucosa. Four ulcers noted at cecum/ascending colon. One covered with blood no pooling. These ulcers appear to be post polypectomy. Patient's colonoscopy was about 8 weeks ago. She must have slow healing due to full dose aspirin. Patient will have H&H and Hemoccult in one month. If anemia does not correct with by mouth iron and she remains with heme positive stool she may need repeat colonoscopy.

## 2015-08-25 ENCOUNTER — Encounter (HOSPITAL_COMMUNITY): Payer: Self-pay | Admitting: Internal Medicine

## 2015-08-31 ENCOUNTER — Telehealth (INDEPENDENT_AMBULATORY_CARE_PROVIDER_SITE_OTHER): Payer: Self-pay | Admitting: *Deleted

## 2015-08-31 DIAGNOSIS — K921 Melena: Secondary | ICD-10-CM

## 2015-08-31 DIAGNOSIS — M0589 Other rheumatoid arthritis with rheumatoid factor of multiple sites: Secondary | ICD-10-CM | POA: Diagnosis not present

## 2015-08-31 NOTE — Telephone Encounter (Signed)
H&H and Hemmocult noted for 1 months.

## 2015-08-31 NOTE — Telephone Encounter (Signed)
Per Dr.Rehman the patient will need to have labs drawn in 1 month with 1 hemoccult.

## 2015-09-01 DIAGNOSIS — K1379 Other lesions of oral mucosa: Secondary | ICD-10-CM | POA: Diagnosis not present

## 2015-09-01 DIAGNOSIS — M0589 Other rheumatoid arthritis with rheumatoid factor of multiple sites: Secondary | ICD-10-CM | POA: Diagnosis not present

## 2015-09-01 DIAGNOSIS — M1A09X0 Idiopathic chronic gout, multiple sites, without tophus (tophi): Secondary | ICD-10-CM | POA: Diagnosis not present

## 2015-09-01 DIAGNOSIS — M150 Primary generalized (osteo)arthritis: Secondary | ICD-10-CM | POA: Diagnosis not present

## 2015-09-01 DIAGNOSIS — M797 Fibromyalgia: Secondary | ICD-10-CM | POA: Diagnosis not present

## 2015-09-01 DIAGNOSIS — M5136 Other intervertebral disc degeneration, lumbar region: Secondary | ICD-10-CM | POA: Diagnosis not present

## 2015-09-10 ENCOUNTER — Other Ambulatory Visit (INDEPENDENT_AMBULATORY_CARE_PROVIDER_SITE_OTHER): Payer: Self-pay | Admitting: *Deleted

## 2015-09-10 ENCOUNTER — Encounter (INDEPENDENT_AMBULATORY_CARE_PROVIDER_SITE_OTHER): Payer: Self-pay | Admitting: *Deleted

## 2015-09-10 DIAGNOSIS — K921 Melena: Secondary | ICD-10-CM

## 2015-09-16 DIAGNOSIS — M0589 Other rheumatoid arthritis with rheumatoid factor of multiple sites: Secondary | ICD-10-CM | POA: Diagnosis not present

## 2015-09-16 DIAGNOSIS — M150 Primary generalized (osteo)arthritis: Secondary | ICD-10-CM | POA: Diagnosis not present

## 2015-09-16 DIAGNOSIS — M797 Fibromyalgia: Secondary | ICD-10-CM | POA: Diagnosis not present

## 2015-09-16 DIAGNOSIS — K1379 Other lesions of oral mucosa: Secondary | ICD-10-CM | POA: Diagnosis not present

## 2015-09-16 DIAGNOSIS — M1A09X0 Idiopathic chronic gout, multiple sites, without tophus (tophi): Secondary | ICD-10-CM | POA: Diagnosis not present

## 2015-09-16 DIAGNOSIS — M5136 Other intervertebral disc degeneration, lumbar region: Secondary | ICD-10-CM | POA: Diagnosis not present

## 2015-09-28 DIAGNOSIS — M0589 Other rheumatoid arthritis with rheumatoid factor of multiple sites: Secondary | ICD-10-CM | POA: Diagnosis not present

## 2015-10-01 ENCOUNTER — Telehealth (INDEPENDENT_AMBULATORY_CARE_PROVIDER_SITE_OTHER): Payer: Self-pay | Admitting: *Deleted

## 2015-10-01 DIAGNOSIS — K921 Melena: Secondary | ICD-10-CM | POA: Diagnosis not present

## 2015-10-01 NOTE — Telephone Encounter (Signed)
   Diagnosis:    Result(s)   Card 1: Negative:          Completed by: Srihan Brutus LPN   HEMOCCULT SENSA DEVELOPER: SWH#:[id number]   [id number] DATE: 2020-05   HEMOCCULT SENSA CARD:  LOT#:[id number]   EXPIRATION DATE: 07/18   CARD CONTROL RESULTS:  POSITIVE:positive  NEGATIVE: negative    ADDITIONAL COMMENTS: Patient was made aware of her results. She states that she is having Right Quadrant Pain. This occurs in the [place] and has awakened her in her sleep. Dr.Rehman will be made aware.

## 2015-10-02 LAB — HEMOGLOBIN AND HEMATOCRIT, BLOOD
HEMATOCRIT: 38.8 % (ref 35.0–45.0)
Hemoglobin: 12.2 g/dL (ref 11.7–15.5)

## 2015-10-02 NOTE — Telephone Encounter (Signed)
Forwarded to Dr.Rehman for review. 

## 2015-10-05 NOTE — Telephone Encounter (Signed)
Stool guaiac is negative. Results of an reviewed with patient.

## 2015-10-26 DIAGNOSIS — M0589 Other rheumatoid arthritis with rheumatoid factor of multiple sites: Secondary | ICD-10-CM | POA: Diagnosis not present

## 2015-11-04 DIAGNOSIS — M25561 Pain in right knee: Secondary | ICD-10-CM | POA: Diagnosis not present

## 2015-11-04 DIAGNOSIS — M25512 Pain in left shoulder: Secondary | ICD-10-CM | POA: Diagnosis not present

## 2015-11-10 ENCOUNTER — Other Ambulatory Visit: Payer: Self-pay | Admitting: Orthopedic Surgery

## 2015-11-10 DIAGNOSIS — M545 Low back pain: Secondary | ICD-10-CM

## 2015-11-19 ENCOUNTER — Ambulatory Visit
Admission: RE | Admit: 2015-11-19 | Discharge: 2015-11-19 | Disposition: A | Discharge status: Home / Self Care | Payer: Medicare Other | Source: Ambulatory Visit | Attending: Orthopedic Surgery | Admitting: Orthopedic Surgery

## 2015-11-19 DIAGNOSIS — E118 Type 2 diabetes mellitus with unspecified complications: Secondary | ICD-10-CM | POA: Diagnosis not present

## 2015-11-19 DIAGNOSIS — S3992XA Unspecified injury of lower back, initial encounter: Secondary | ICD-10-CM | POA: Diagnosis not present

## 2015-11-19 DIAGNOSIS — I10 Essential (primary) hypertension: Secondary | ICD-10-CM | POA: Diagnosis not present

## 2015-11-19 DIAGNOSIS — M545 Low back pain, unspecified: Secondary | ICD-10-CM

## 2015-11-23 DIAGNOSIS — M0589 Other rheumatoid arthritis with rheumatoid factor of multiple sites: Secondary | ICD-10-CM | POA: Diagnosis not present

## 2015-11-24 DIAGNOSIS — Z7982 Long term (current) use of aspirin: Secondary | ICD-10-CM | POA: Diagnosis not present

## 2015-11-24 DIAGNOSIS — D72829 Elevated white blood cell count, unspecified: Secondary | ICD-10-CM | POA: Diagnosis not present

## 2015-11-24 DIAGNOSIS — E782 Mixed hyperlipidemia: Secondary | ICD-10-CM | POA: Diagnosis not present

## 2015-11-24 DIAGNOSIS — B351 Tinea unguium: Secondary | ICD-10-CM | POA: Diagnosis not present

## 2015-11-24 DIAGNOSIS — R1010 Upper abdominal pain, unspecified: Secondary | ICD-10-CM | POA: Diagnosis not present

## 2015-12-03 ENCOUNTER — Other Ambulatory Visit (HOSPITAL_COMMUNITY): Payer: Self-pay | Admitting: Internal Medicine

## 2015-12-03 DIAGNOSIS — D72829 Elevated white blood cell count, unspecified: Secondary | ICD-10-CM

## 2015-12-03 DIAGNOSIS — E782 Mixed hyperlipidemia: Secondary | ICD-10-CM

## 2015-12-03 DIAGNOSIS — R1010 Upper abdominal pain, unspecified: Secondary | ICD-10-CM

## 2015-12-04 ENCOUNTER — Ambulatory Visit (HOSPITAL_COMMUNITY): Admission: RE | Admit: 2015-12-04 | Payer: Medicare Other | Source: Ambulatory Visit

## 2015-12-08 DIAGNOSIS — M47816 Spondylosis without myelopathy or radiculopathy, lumbar region: Secondary | ICD-10-CM | POA: Diagnosis not present

## 2015-12-08 DIAGNOSIS — M545 Low back pain: Secondary | ICD-10-CM | POA: Diagnosis not present

## 2015-12-10 ENCOUNTER — Ambulatory Visit (HOSPITAL_COMMUNITY)
Admission: RE | Admit: 2015-12-10 | Discharge: 2015-12-10 | Disposition: A | Discharge status: Home / Self Care | Payer: Medicare Other | Source: Ambulatory Visit | Attending: Internal Medicine | Admitting: Internal Medicine

## 2015-12-10 ENCOUNTER — Encounter (HOSPITAL_COMMUNITY): Payer: Self-pay

## 2015-12-10 DIAGNOSIS — Z9049 Acquired absence of other specified parts of digestive tract: Secondary | ICD-10-CM | POA: Insufficient documentation

## 2015-12-10 DIAGNOSIS — E782 Mixed hyperlipidemia: Secondary | ICD-10-CM | POA: Insufficient documentation

## 2015-12-10 DIAGNOSIS — K760 Fatty (change of) liver, not elsewhere classified: Secondary | ICD-10-CM | POA: Insufficient documentation

## 2015-12-10 DIAGNOSIS — Z90710 Acquired absence of both cervix and uterus: Secondary | ICD-10-CM | POA: Insufficient documentation

## 2015-12-10 DIAGNOSIS — R1010 Upper abdominal pain, unspecified: Secondary | ICD-10-CM | POA: Diagnosis not present

## 2015-12-10 DIAGNOSIS — D72829 Elevated white blood cell count, unspecified: Secondary | ICD-10-CM | POA: Insufficient documentation

## 2015-12-22 DIAGNOSIS — M25531 Pain in right wrist: Secondary | ICD-10-CM | POA: Diagnosis not present

## 2015-12-22 DIAGNOSIS — H9209 Otalgia, unspecified ear: Secondary | ICD-10-CM | POA: Diagnosis not present

## 2015-12-22 DIAGNOSIS — M1A09X0 Idiopathic chronic gout, multiple sites, without tophus (tophi): Secondary | ICD-10-CM | POA: Diagnosis not present

## 2015-12-22 DIAGNOSIS — M5136 Other intervertebral disc degeneration, lumbar region: Secondary | ICD-10-CM | POA: Diagnosis not present

## 2015-12-22 DIAGNOSIS — J310 Chronic rhinitis: Secondary | ICD-10-CM | POA: Diagnosis not present

## 2015-12-22 DIAGNOSIS — M797 Fibromyalgia: Secondary | ICD-10-CM | POA: Diagnosis not present

## 2015-12-22 DIAGNOSIS — M150 Primary generalized (osteo)arthritis: Secondary | ICD-10-CM | POA: Diagnosis not present

## 2015-12-22 DIAGNOSIS — M25532 Pain in left wrist: Secondary | ICD-10-CM | POA: Diagnosis not present

## 2015-12-22 DIAGNOSIS — K1379 Other lesions of oral mucosa: Secondary | ICD-10-CM | POA: Diagnosis not present

## 2015-12-22 DIAGNOSIS — H900 Conductive hearing loss, bilateral: Secondary | ICD-10-CM | POA: Diagnosis not present

## 2015-12-22 DIAGNOSIS — M0589 Other rheumatoid arthritis with rheumatoid factor of multiple sites: Secondary | ICD-10-CM | POA: Diagnosis not present

## 2015-12-25 DIAGNOSIS — Z9181 History of falling: Secondary | ICD-10-CM | POA: Diagnosis not present

## 2015-12-25 DIAGNOSIS — R10819 Abdominal tenderness, unspecified site: Secondary | ICD-10-CM | POA: Diagnosis not present

## 2015-12-25 DIAGNOSIS — M069 Rheumatoid arthritis, unspecified: Secondary | ICD-10-CM | POA: Diagnosis not present

## 2015-12-28 DIAGNOSIS — Z79899 Other long term (current) drug therapy: Secondary | ICD-10-CM | POA: Diagnosis not present

## 2015-12-28 DIAGNOSIS — M0589 Other rheumatoid arthritis with rheumatoid factor of multiple sites: Secondary | ICD-10-CM | POA: Diagnosis not present

## 2015-12-29 DIAGNOSIS — M0589 Other rheumatoid arthritis with rheumatoid factor of multiple sites: Secondary | ICD-10-CM | POA: Diagnosis not present

## 2016-01-12 DIAGNOSIS — M545 Low back pain: Secondary | ICD-10-CM | POA: Diagnosis not present

## 2016-01-12 DIAGNOSIS — M47816 Spondylosis without myelopathy or radiculopathy, lumbar region: Secondary | ICD-10-CM | POA: Diagnosis not present

## 2016-01-14 DIAGNOSIS — Z049 Encounter for examination and observation for unspecified reason: Secondary | ICD-10-CM | POA: Diagnosis not present

## 2016-01-14 DIAGNOSIS — Z79899 Other long term (current) drug therapy: Secondary | ICD-10-CM | POA: Diagnosis not present

## 2016-01-14 DIAGNOSIS — M059 Rheumatoid arthritis with rheumatoid factor, unspecified: Secondary | ICD-10-CM | POA: Diagnosis not present

## 2016-01-14 DIAGNOSIS — H04123 Dry eye syndrome of bilateral lacrimal glands: Secondary | ICD-10-CM | POA: Diagnosis not present

## 2016-01-14 DIAGNOSIS — H43822 Vitreomacular adhesion, left eye: Secondary | ICD-10-CM | POA: Diagnosis not present

## 2016-01-14 DIAGNOSIS — Z09 Encounter for follow-up examination after completed treatment for conditions other than malignant neoplasm: Secondary | ICD-10-CM | POA: Diagnosis not present

## 2016-01-14 DIAGNOSIS — H2513 Age-related nuclear cataract, bilateral: Secondary | ICD-10-CM | POA: Diagnosis not present

## 2016-01-25 DIAGNOSIS — Z79899 Other long term (current) drug therapy: Secondary | ICD-10-CM | POA: Diagnosis not present

## 2016-01-25 DIAGNOSIS — M0589 Other rheumatoid arthritis with rheumatoid factor of multiple sites: Secondary | ICD-10-CM | POA: Diagnosis not present

## 2016-02-04 ENCOUNTER — Encounter (INDEPENDENT_AMBULATORY_CARE_PROVIDER_SITE_OTHER): Payer: Self-pay | Admitting: Internal Medicine

## 2016-02-04 ENCOUNTER — Ambulatory Visit (INDEPENDENT_AMBULATORY_CARE_PROVIDER_SITE_OTHER): Payer: Medicare Other | Admitting: Internal Medicine

## 2016-02-04 ENCOUNTER — Encounter (INDEPENDENT_AMBULATORY_CARE_PROVIDER_SITE_OTHER): Payer: Self-pay

## 2016-02-04 ENCOUNTER — Encounter (INDEPENDENT_AMBULATORY_CARE_PROVIDER_SITE_OTHER): Payer: Self-pay | Admitting: *Deleted

## 2016-02-04 VITALS — BP 134/60 | HR 80 | Temp 98.3°F | Ht 67.5 in | Wt 191.0 lb

## 2016-02-04 DIAGNOSIS — K8689 Other specified diseases of pancreas: Secondary | ICD-10-CM | POA: Diagnosis not present

## 2016-02-04 DIAGNOSIS — D500 Iron deficiency anemia secondary to blood loss (chronic): Secondary | ICD-10-CM | POA: Diagnosis not present

## 2016-02-04 NOTE — Patient Instructions (Signed)
Labs. OV pending.

## 2016-02-04 NOTE — Progress Notes (Addendum)
Subjective:    Patient ID: [name], female    DOB: 11-19-1950, 65 y.o.   MRN: [id number]  HPI  Presents today and she states she has a fatty liver. She also says she had a CT scan which revealed IMPRESSION: 1. Moderate colonic stool burden. No acute bowel inflammation or obstruction. 2. Fatty infiltration of the liver. 3. Probable chronic small left hydrosalpinx. 4. Hysterectomy, cholecystectomy and appendectomy.   She tells me today she feels okay.  She tells me sometimes her stools are formed and sometimes it is like [place] stool. She sometimes feel like she does not empty completely.  She is taking fiber now and a Probiotic.  08/20/2015 Given's Capsule: [facility], heme positive stool;  She has frequent reflux. Sometmes drinking is worse than eating. She has a BM one to five a day or she may skip days. Summary & Recommendations: Punctate duodenal telangiectasia without stigmata of bleed otherwise normal small bowel mucosa. Four ulcers noted at cecum/ascending colon. One covered with blood no pooling. These ulcers appear to be post polypectomy. Patient's colonoscopy was about 8 weeks ago. She must have slow healing due to full dose aspirin. Patient will have H&H and Hemoccult in one month. If anemia does not correct with by mouth iron and she remains with heme positive stool she may need repeat colonoscopy.  06/26/2015 Colonoscopy:  ndications:Iron deficiency anemia secondary to chronic blood loss  Impression:               - One 4 mm polyp in the cecum, removed with a cold                            snare. Resected and retrieved. this polyp was                            submitted with other polyps that were removed from                            ascending colon and hepatic flexure.                           - One 15 mm polyp at the ileocecal valve, removed                            with a hot snare. Resected and retrieved.                           - Eleven 7 to 10 mm  polyps at the hepatic flexure                            and in the ascending colon, removed with a hot                            snare. Resected and retrieved. these polyps were  Biopsy: Colon, Tubular adenoma. Next colonoscopy in 3 yrs.   06/26/2015 EGD: IDA: Impression:               - Normal esophagus.                           -  Z-line regular, 38 cm from the incisors.                           - Multiple gastric polyps. Biopsied.                           - Normal duodenal bulb and second portion of the                            duodenum.  CBC Latest Ref Rng & Units 10/01/2015 08/03/2015 06/26/2015  WBC 3.8 - 10.8 K/uL - - -  Hemoglobin 11.7 - 15.5 g/dL 12.2 11.5(L) 10.2(L)  Hematocrit 35.0 - 45.0 % 38.8 37.5 35.4(L)  Platelets 140 - 400 K/uL - - -    Review of Systems Past Medical History:  Diagnosis Date  . Anemia   . Angina   . Arthritis    Rheumatoid  . Cancer ([place])    left breast, cervical cancer  . Colon polyps   . Constipation   . Diarrhea   . GERD (gastroesophageal reflux disease)   . Heart murmur   . Hypercholesterolemia   . Hypertension   . Numbness and tingling in right hand   . Peripheral vascular disease ([place])    cerebrel aneurysm /sp coil  . PONV (postoperative nausea and vomiting)   . Rheumatoid arthritis with rheumatoid factor (HCC)   . Right knee DJD   . Stroke [facility])    jan 2012    Past Surgical History:  Procedure Laterality Date  . ABDOMINAL HYSTERECTOMY  1977  . APPENDECTOMY  1964  . BILATERAL OOPHORECTOMY    . CHOLECYSTECTOMY    . COLONOSCOPY  12/09/2010   Procedure: COLONOSCOPY;  Surgeon: Rogene Houston, MD;  [facility];  Service: Endoscopy;  Laterality: N/A;  . COLONOSCOPY N/A 06/26/2015   Procedure: COLONOSCOPY;  Surgeon: Rogene Houston, MD;  [facility];  Service: Endoscopy;  Laterality: N/A;  1:05-moved to [address] to notify pt  . ESOPHAGOGASTRODUODENOSCOPY N/A 06/26/2015   Procedure:  ESOPHAGOGASTRODUODENOSCOPY (EGD);  Surgeon: Rogene Houston, MD;  [facility];  Service: Endoscopy;  Laterality: N/A;  . GIVENS CAPSULE STUDY N/A 08/20/2015   Procedure: GIVENS CAPSULE STUDY;  Surgeon: Rogene Houston, MD;  [facility];  Service: Endoscopy;  Laterality: N/A;  730  . left breast lumpectomy  2004  . left knee arthroscopy    . Right brain aneurysm  04/2010   Coiled   . right wrist surgery  1999   synevectomy  . TOTAL KNEE ARTHROPLASTY Right 02/25/2013   Procedure: TOTAL KNEE ARTHROPLASTY;  Surgeon: Lorn Junes, MD;  Location: [facility];  Service: Orthopedics;  Laterality: Right;    Allergies  Allergen Reactions  . Aurothioglucose [Solganal] Other (See Comments)    REACTION: VASCULITIS   . Calcium Channel Blockers Palpitations  . Feldene [Piroxicam] Anaphylaxis and Other (See Comments)    SKIN BLISTERS  . Hydralazine Other (See Comments)    REACTION: Chest pains and headaches: IV only  . Imdur [Isosorbide Mononitrate] Other (See Comments)    REACTION: PATIENT IS UNABLE TO WALK OR FUNCTION  . Imuran [Azathioprine]     Could not walk or function  . Methotrexate Derivatives Other (See Comments)    REACTION: VASCULITIS  . Neomycin Rash  . Nickel Rash  . Thimerosal Other (  See Comments)    SKIN BLISTERED  . Percocet [Oxycodone-Acetaminophen] Other (See Comments)    REACTION : INSOMNIA  . Vioxx [Rofecoxib] Other (See Comments)    REACTION: CAUSED MORE JOINT PAIN  . Arava [Leflunomide]     REACTION: UNKNOWN  . Asacol [Mesalamine] Other (See Comments)    REACTION: UNKNOWN  . Hydrocodone Itching  . Iohexol      Desc: PT STATES SHE WAS TOLD NEVER TO HAVE CT CONTRAST AGAIN.  SHE HAD SOME KIND OF REACTION BUT DOESNT REMEBER WHAT HAPPENED   . Sulfa Antibiotics     UNKNOWN  . Latex Rash  . Nsaids Other (See Comments)    GI UPSET , CAN TOLERATE SOME NSAIDS    Current Outpatient Prescriptions on File Prior to Visit  Medication Sig Dispense Refill    . allopurinol (ZYLOPRIM) 100 MG tablet Take 300 mg by mouth daily.     Marland Kitchen aspirin 325 MG tablet Take 1 tablet (325 mg total) by mouth daily.    . Calcium Carbonate-Vit D-Min (CALTRATE 600+D PLUS) 600-400 MG-UNIT per tablet Chew 1 tablet by mouth 2 (two) times daily.      . Certolizumab Pegol (CIMZIA PREFILLED) 2 X 200 MG/ML KIT Inject 200 mg into the skin every 30 (thirty) days.    . cetirizine (ZYRTEC) 10 MG tablet Take 10 mg by mouth daily.    . Cholecalciferol (VITAMIN D) 2000 UNITS tablet Take 2,000 Units by mouth daily.     . DULoxetine (CYMBALTA) 60 MG capsule Take 60 mg by mouth daily.    Marland Kitchen FIBER SELECT GUMMIES PO Take 2 each by mouth daily.     . hydroxychloroquine (PLAQUENIL) 200 MG tablet Take 200 mg by mouth daily.     . magnesium oxide (MAG-OX) 400 MG tablet Take 400 mg by mouth daily.      . Nebivolol HCl (BYSTOLIC) 20 MG TABS Take 20 mg by mouth daily.     Marland Kitchen omeprazole (PRILOSEC) 40 MG capsule TAKE (1) CAPSULE BY MOUTH ONCE DAILY. 30 capsule 11  . Polyethyl Glycol-Propyl Glycol (SYSTANE) 0.4-0.3 % GEL Apply 1 application to eye daily as needed. For dry eye relief    . traMADol (ULTRAM) 50 MG tablet Take 50 mg by mouth as needed for moderate pain.     . ferrous sulfate 325 (65 FE) MG tablet Take 325 mg by mouth daily with breakfast.     No current facility-administered medications on file prior to visit.        Objective:   Physical Exam Blood pressure 134/60, pulse 80, temperature 98.3 F (36.8 C), height 5' 7.5" (1.715 m), weight 191 lb (86.6 kg). Alert and oriented. Skin warm and dry. Oral mucosa is moist.   . Sclera anicteric, conjunctivae is pink. Thyroid not enlarged. No cervical lymphadenopathy. Lungs clear. Heart regular rate and rhythm. Murmur heard. Abdomen is soft. Bowel sounds are positive. No hepatomegaly. No abdominal masses felt         Assessment & Plan:  Anemia. Am going to get a CBC Fatty liver: am going to get a CMET Atrophic pancrease per CT: US  abdomen/ amylase, lipase Patient was very rude in office today. States the front office should have told me why she was here. She became upset when I asked her why she was here. The last time we saw her was for anemia.  She did not want to answer any of my questions. Became upset when labs from Dr. Maudie Mercury was not in  the computer.

## 2016-02-05 LAB — COMPREHENSIVE METABOLIC PANEL
ALBUMIN: 3.9 g/dL (ref 3.6–5.1)
ALK PHOS: 94 U/L (ref 33–130)
ALT: 38 U/L — AB (ref 6–29)
AST: 22 U/L (ref 10–35)
BUN: 25 mg/dL (ref 7–25)
CALCIUM: 9.6 mg/dL (ref 8.6–10.4)
CO2: 29 mmol/L (ref 20–31)
Chloride: 104 mmol/L (ref 98–110)
Creat: 1 mg/dL — ABNORMAL HIGH (ref 0.50–0.99)
GLUCOSE: 134 mg/dL — AB (ref 65–99)
POTASSIUM: 4.6 mmol/L (ref 3.5–5.3)
Sodium: 144 mmol/L (ref 135–146)
Total Bilirubin: 0.6 mg/dL (ref 0.2–1.2)
Total Protein: 6.2 g/dL (ref 6.1–8.1)

## 2016-02-05 LAB — CBC WITH DIFFERENTIAL/PLATELET
BASOS PCT: 0 %
Basophils Absolute: 0 cells/uL (ref 0–200)
EOS ABS: 144 {cells}/uL (ref 15–500)
Eosinophils Relative: 1 %
HEMATOCRIT: 38.3 % (ref 35.0–45.0)
HEMOGLOBIN: 12.4 g/dL (ref 11.7–15.5)
LYMPHS PCT: 18 %
Lymphs Abs: 2592 cells/uL (ref 850–3900)
MCH: 30.2 pg (ref 27.0–33.0)
MCHC: 32.4 g/dL (ref 32.0–36.0)
MCV: 93.4 fL (ref 80.0–100.0)
MONO ABS: 1584 {cells}/uL — AB (ref 200–950)
MPV: 9.5 fL (ref 7.5–12.5)
Monocytes Relative: 11 %
NEUTROS PCT: 70 %
Neutro Abs: 10080 cells/uL — ABNORMAL HIGH (ref 1500–7800)
Platelets: 211 10*3/uL (ref 140–400)
RBC: 4.1 MIL/uL (ref 3.80–5.10)
RDW: 16.1 % — AB (ref 11.0–15.0)
WBC: 14.4 10*3/uL — ABNORMAL HIGH (ref 3.8–10.8)

## 2016-02-05 LAB — LIPASE: LIPASE: 51 U/L (ref 7–60)

## 2016-02-05 LAB — AMYLASE: AMYLASE: 35 U/L (ref 0–105)

## 2016-02-09 ENCOUNTER — Emergency Department (HOSPITAL_COMMUNITY)
Admission: EM | Admit: 2016-02-09 | Discharge: 2016-02-09 | Disposition: A | Discharge status: Home / Self Care | Payer: Medicare Other | Attending: Emergency Medicine | Admitting: Emergency Medicine

## 2016-02-09 ENCOUNTER — Emergency Department (HOSPITAL_COMMUNITY): Payer: Medicare Other

## 2016-02-09 ENCOUNTER — Encounter (HOSPITAL_COMMUNITY): Payer: Self-pay | Admitting: Emergency Medicine

## 2016-02-09 DIAGNOSIS — M79605 Pain in left leg: Secondary | ICD-10-CM | POA: Diagnosis present

## 2016-02-09 DIAGNOSIS — I10 Essential (primary) hypertension: Secondary | ICD-10-CM | POA: Diagnosis not present

## 2016-02-09 DIAGNOSIS — I82402 Acute embolism and thrombosis of unspecified deep veins of left lower extremity: Secondary | ICD-10-CM | POA: Diagnosis not present

## 2016-02-09 DIAGNOSIS — Z7982 Long term (current) use of aspirin: Secondary | ICD-10-CM | POA: Insufficient documentation

## 2016-02-09 DIAGNOSIS — Z853 Personal history of malignant neoplasm of breast: Secondary | ICD-10-CM | POA: Insufficient documentation

## 2016-02-09 DIAGNOSIS — I82412 Acute embolism and thrombosis of left femoral vein: Secondary | ICD-10-CM

## 2016-02-09 DIAGNOSIS — Z79899 Other long term (current) drug therapy: Secondary | ICD-10-CM | POA: Insufficient documentation

## 2016-02-09 DIAGNOSIS — Z87891 Personal history of nicotine dependence: Secondary | ICD-10-CM | POA: Insufficient documentation

## 2016-02-09 DIAGNOSIS — R600 Localized edema: Secondary | ICD-10-CM | POA: Diagnosis not present

## 2016-02-09 DIAGNOSIS — I82432 Acute embolism and thrombosis of left popliteal vein: Secondary | ICD-10-CM | POA: Diagnosis not present

## 2016-02-09 LAB — COMPREHENSIVE METABOLIC PANEL
ALK PHOS: 91 U/L (ref 38–126)
ALT: 49 U/L (ref 14–54)
AST: 32 U/L (ref 15–41)
Albumin: 3.4 g/dL — ABNORMAL LOW (ref 3.5–5.0)
Anion gap: 9 (ref 5–15)
BILIRUBIN TOTAL: 0.6 mg/dL (ref 0.3–1.2)
BUN: 23 mg/dL — AB (ref 6–20)
CALCIUM: 9.1 mg/dL (ref 8.9–10.3)
CHLORIDE: 101 mmol/L (ref 101–111)
CO2: 27 mmol/L (ref 22–32)
CREATININE: 0.87 mg/dL (ref 0.44–1.00)
Glucose, Bld: 192 mg/dL — ABNORMAL HIGH (ref 65–99)
Potassium: 3.9 mmol/L (ref 3.5–5.1)
Sodium: 137 mmol/L (ref 135–145)
TOTAL PROTEIN: 6.1 g/dL — AB (ref 6.5–8.1)

## 2016-02-09 LAB — CBC WITH DIFFERENTIAL/PLATELET
BASOS ABS: 0 10*3/uL (ref 0.0–0.1)
Basophils Relative: 0 %
EOS PCT: 0 %
Eosinophils Absolute: 0 10*3/uL (ref 0.0–0.7)
HEMATOCRIT: 37.3 % (ref 36.0–46.0)
HEMOGLOBIN: 11.6 g/dL — AB (ref 12.0–15.0)
LYMPHS ABS: 1.3 10*3/uL (ref 0.7–4.0)
LYMPHS PCT: 10 %
MCH: 31 pg (ref 26.0–34.0)
MCHC: 31.1 g/dL (ref 30.0–36.0)
MCV: 99.7 fL (ref 78.0–100.0)
Monocytes Absolute: 0.9 10*3/uL (ref 0.1–1.0)
Monocytes Relative: 7 %
NEUTROS ABS: 10.9 10*3/uL — AB (ref 1.7–7.7)
Neutrophils Relative %: 83 %
Platelets: 214 10*3/uL (ref 150–400)
RBC: 3.74 MIL/uL — AB (ref 3.87–5.11)
RDW: 16.6 % — ABNORMAL HIGH (ref 11.5–15.5)
WBC: 13.1 10*3/uL — AB (ref 4.0–10.5)

## 2016-02-09 MED ORDER — RIVAROXABAN 15 MG PO TABS
15.0000 mg | ORAL_TABLET | Freq: Once | ORAL | Status: AC
  Administered 2016-02-09: 15 mg via ORAL
  Filled 2016-02-09: qty 1

## 2016-02-09 MED ORDER — RIVAROXABAN (XARELTO) EDUCATION KIT FOR DVT/PE PATIENTS
PACK | Freq: Once | Status: AC
  Administered 2016-02-09: 23:00:00

## 2016-02-09 MED ORDER — RIVAROXABAN 15 MG PO TABS
ORAL_TABLET | ORAL | Status: AC
  Filled 2016-02-09: qty 1

## 2016-02-09 MED ORDER — RIVAROXABAN (XARELTO) EDUCATION KIT FOR AFIB PATIENTS: PACK | Freq: Once | Status: DC

## 2016-02-09 MED ORDER — RIVAROXABAN (XARELTO) VTE STARTER PACK (15 & 20 MG): ORAL_TABLET | ORAL | 0 refills | Status: DC

## 2016-02-09 NOTE — ED Notes (Signed)
Education given on [employer] to patient and husband. Patient and husband verbalizes understanding.

## 2016-02-09 NOTE — ED Provider Notes (Signed)
[facility] DEPT Provider Note   CSN: [id number] Arrival date & time: 02/09/16  1513     History   Chief Complaint Chief Complaint  Patient presents with  . Leg Pain    HPI [name] is a 65 y.o. female.  The history is provided by the patient.  Leg Pain   This is a new problem. The current episode started more than 1 week ago. The problem occurs constantly. The problem has been gradually worsening. The pain is present in the left lower leg. The pain is moderate. Pertinent negatives include no numbness, full range of motion, no stiffness, no tingling and no itching. The symptoms are aggravated by activity. She has tried nothing for the symptoms. There has been no history of extremity trauma.   She denies any recent travels, surgeries, prior DVTs.  Past Medical History:  Diagnosis Date  . Anemia   . Angina   . Arthritis    Rheumatoid  . Cancer ([place])    left breast, cervical cancer  . Colon polyps   . Constipation   . Diarrhea   . GERD (gastroesophageal reflux disease)   . Heart murmur   . Hypercholesterolemia   . Hypertension   . Numbness and tingling in right hand   . Peripheral vascular disease ([place])    cerebrel aneurysm /sp coil  . PONV (postoperative nausea and vomiting)   . Rheumatoid arthritis with rheumatoid factor (HCC)   . Right knee DJD   . Stroke [facility])    jan 2012    Patient Active Problem List   Diagnosis Date Noted  . Venous insufficiency   . Venous intermittent claudication   . Diverticulitis of colon (without mention of hemorrhage)(562.11) 08/26/2013  . Postoperative anemia due to acute blood loss 02/26/2013  . Anemia, unspecified 02/26/2013  . DJD (degenerative joint disease) of knee 02/25/2013  . PONV (postoperative nausea and vomiting)   . Stroke ([place])   . Hypercholesterolemia   . Cancer ([place])   . Colon polyps   . Arthritis   . Rheumatoid arthritis with rheumatoid factor (HCC)   . Right knee DJD   . Asthenia 08/14/2012  .  Cerebral aneurysm, nonruptured 08/14/2012  . TIA (transient ischemic attack) 08/14/2012  . RA (rheumatoid arthritis) 10/27/2011  . High cholesterol 10/27/2011  . Hypertension 10/27/2011  . GERD (gastroesophageal reflux disease) 10/27/2011    Past Surgical History:  Procedure Laterality Date  . ABDOMINAL HYSTERECTOMY  1977  . APPENDECTOMY  1964  . BILATERAL OOPHORECTOMY    . CHOLECYSTECTOMY    . COLONOSCOPY  12/09/2010   Procedure: COLONOSCOPY;  Surgeon: Rogene Houston, MD;  [facility];  Service: Endoscopy;  Laterality: N/A;  . COLONOSCOPY N/A 06/26/2015   Procedure: COLONOSCOPY;  Surgeon: Rogene Houston, MD;  [facility];  Service: Endoscopy;  Laterality: N/A;  1:05-moved to [address] to notify pt  . ESOPHAGOGASTRODUODENOSCOPY N/A 06/26/2015   Procedure: ESOPHAGOGASTRODUODENOSCOPY (EGD);  Surgeon: Rogene Houston, MD;  [facility];  Service: Endoscopy;  Laterality: N/A;  . GIVENS CAPSULE STUDY N/A 08/20/2015   Procedure: GIVENS CAPSULE STUDY;  Surgeon: Rogene Houston, MD;  [facility];  Service: Endoscopy;  Laterality: N/A;  730  . left breast lumpectomy  2004  . left knee arthroscopy    . Right brain aneurysm  04/2010   Coiled   . right wrist surgery  1999   synevectomy  . TOTAL KNEE ARTHROPLASTY Right 02/25/2013  Procedure: TOTAL KNEE ARTHROPLASTY;  Surgeon: Lorn Junes, MD;  Location: [facility];  Service: Orthopedics;  Laterality: Right;    OB History    Gravida Para Term Preterm AB Living             0   SAB TAB Ectopic Multiple Live Births                   Home Medications    Prior to Admission medications   Medication Sig Start Date End Date Taking? Authorizing Provider  allopurinol (ZYLOPRIM) 100 MG tablet Take 300 mg by mouth daily.     Historical Provider, MD  aspirin 325 MG tablet Take 1 tablet (325 mg total) by mouth daily. 07/06/15   Rogene Houston, MD  Calcium Carbonate-Vit D-Min (CALTRATE 600+D PLUS) 600-400  MG-UNIT per tablet Chew 1 tablet by mouth 2 (two) times daily.      Historical Provider, MD  Certolizumab Pegol (CIMZIA PREFILLED) 2 X 200 MG/ML KIT Inject 200 mg into the skin every 30 (thirty) days.    Historical Provider, MD  cetirizine (ZYRTEC) 10 MG tablet Take 10 mg by mouth daily.    Historical Provider, MD  Cholecalciferol (VITAMIN D) 2000 UNITS tablet Take 2,000 Units by mouth daily.     Historical Provider, MD  DULoxetine (CYMBALTA) 60 MG capsule Take 60 mg by mouth daily.    Historical Provider, MD  ferrous sulfate 325 (65 FE) MG tablet Take 325 mg by mouth daily with breakfast.    Historical Provider, MD  [place] Take 2 each by mouth daily.     Historical Provider, MD  hydroxychloroquine (PLAQUENIL) 200 MG tablet Take 200 mg by mouth daily.     Historical Provider, MD  losartan-hydrochlorothiazide (HYZAAR) 100-12.5 MG tablet Take 1 tablet by mouth daily.    Historical Provider, MD  magnesium oxide (MAG-OX) 400 MG tablet Take 400 mg by mouth daily.      Historical Provider, MD  Nebivolol HCl (BYSTOLIC) 20 MG TABS Take 20 mg by mouth daily.     Historical Provider, MD  omeprazole (PRILOSEC) 40 MG capsule TAKE (1) CAPSULE BY MOUTH ONCE DAILY. 07/21/14   Rogene Houston, MD  Polyethyl Glycol-Propyl Glycol (SYSTANE) 0.4-0.3 % GEL Apply 1 application to eye daily as needed. For dry eye relief    Historical Provider, MD  Rivaroxaban 15 & 20 MG TBPK Take as directed on package: Start with one '15mg'$  tablet by mouth twice a day with food. On Day 22, switch to one '20mg'$  tablet once a day with food. 02/09/16   Fatima Blank, MD  traMADol (ULTRAM) 50 MG tablet Take 50 mg by mouth as needed for moderate pain.     Historical Provider, MD    Family History Family History  Problem Relation Age of Onset  . Hypertension Father   . Heart attack Father   . Heart disease Maternal Uncle   . Colon cancer Neg Hx     Social History Social History  Substance Use Topics  . Smoking  status: Former Smoker    Packs/day: 2.00    Years: 15.00    Quit date: 02/20/1984  . Smokeless tobacco: Never Used     Comment: Quit 1986  . Alcohol use No     Allergies   Aurothioglucose [solganal]; Calcium channel blockers; Feldene [piroxicam]; Hydralazine; Imdur [isosorbide mononitrate]; Imuran [azathioprine]; Methotrexate derivatives; Neomycin; Nickel; Thimerosal; Percocet [oxycodone-acetaminophen]; Vioxx [rofecoxib]; Arava [leflunomide]; Asacol [mesalamine]; Hydrocodone; Iohexol;  Sulfa antibiotics; Latex; and Nsaids   Review of Systems Review of Systems  Musculoskeletal: Negative for stiffness.  Skin: Negative for itching.  Neurological: Negative for tingling and numbness.   Ten systems are reviewed and are negative for acute change except as noted in the HPI   Physical Exam Updated Vital Signs BP (!) 137/51 (BP Location: Left Arm)   Pulse 66   Temp 99.1 F (37.3 C) (Oral)   Resp 16   Ht 5' 7.5" (1.715 m)   Wt 191 lb (86.6 kg)   SpO2 100%   BMI 29.47 kg/m   Physical Exam  Constitutional: She is oriented to person, place, and time. She appears well-developed and well-nourished. No distress.  HENT:  Head: Normocephalic and atraumatic.  Nose: Nose normal.  Eyes: Conjunctivae and EOM are normal. Pupils are equal, round, and reactive to light. Right eye exhibits no discharge. Left eye exhibits no discharge. No scleral icterus.  Neck: Normal range of motion. Neck supple.  Cardiovascular: Normal rate and regular rhythm.  Exam reveals no gallop and no friction rub.   No murmur heard. Pulmonary/Chest: Effort normal and breath sounds normal. No stridor. No respiratory distress. She has no rales.  Abdominal: Soft. She exhibits no distension. There is no tenderness.  Musculoskeletal:       Left lower leg: She exhibits tenderness, swelling and edema.  Neurological: She is alert and oriented to person, place, and time.  Skin: Skin is warm and dry. No rash noted. She is not  diaphoretic. No erythema.  Psychiatric: She has a normal mood and affect.  Vitals reviewed.    ED Treatments / Results  Labs (all labs ordered are listed, but only abnormal results are displayed) Labs Reviewed  CBC WITH DIFFERENTIAL/PLATELET - Abnormal; Notable for the following:       Result Value   WBC 13.1 (*)    RBC 3.74 (*)    Hemoglobin 11.6 (*)    RDW 16.6 (*)    Neutro Abs 10.9 (*)    All other components within normal limits  COMPREHENSIVE METABOLIC PANEL - Abnormal; Notable for the following:    Glucose, Bld 192 (*)    BUN 23 (*)    Total Protein 6.1 (*)    Albumin 3.4 (*)    All other components within normal limits    EKG  EKG Interpretation None       Radiology US Venous Img Lower Unilateral Left  Result Date: 02/09/2016 CLINICAL DATA:  Left lower extremity pain, erythema and swelling for 2-3 weeks. EXAM: LEFT LOWER EXTREMITY VENOUS DOPPLER ULTRASOUND TECHNIQUE: Gray-scale sonography with graded compression, as well as color Doppler and duplex ultrasound were performed to evaluate the lower extremity deep venous systems from the level of the common femoral vein and including the common femoral, femoral, profunda femoral, popliteal and calf veins including the posterior tibial, peroneal and gastrocnemius veins when visible. The superficial great saphenous vein was also interrogated. Spectral Doppler was utilized to evaluate flow at rest and with distal augmentation maneuvers in the common femoral, femoral and popliteal veins. COMPARISON:  12/10/2014 FINDINGS: Contralateral Common Femoral Vein: Respiratory phasicity is normal and symmetric with the symptomatic side. No evidence of thrombus. Normal compressibility. Common Femoral Vein: No evidence of thrombus. Normal compressibility, respiratory phasicity and response to augmentation. Saphenofemoral Junction: No evidence of thrombus. Normal compressibility and flow on color Doppler imaging. Profunda Femoral Vein:  Positive for acute appearing occlusive thrombus. No compressibility or flow on color Doppler imaging. Femoral  Vein: Postive for acute appearing occlusive thrombus starting at junction with the profunda vein. No compressibility, respiratory phasicity nor response to augmentation. Popliteal Vein: Positive for acute occlusive thrombus. No compressibility, respiratory phasicity nor response to augmentation. Calf Veins: Positive for nonocclusive thrombus in the anterior tibial and peroneal veins with lack of compressibility, respiratory phasicity nor augmentation. Patent posterior tibial vein. Superficial Great Saphenous Vein: No evidence of thrombus. Normal compressibility and flow on color Doppler imaging. Venous Reflux:  None. Other Findings:  There is left below-knee soft tissue edema. IMPRESSION: Positive for acute occlusive DVT involving the left profunda, femoral, and popliteal veins with partially occlusive peroneal and anterior tibial veins of the left leg. Critical Value/emergent results were called by telephone at the time of interpretation on 02/09/2016 at 6:04 pm to Dr. Addison Lank , who verbally acknowledged these results. Electronically Signed   By: Ashley Royalty M.D.   On: 02/09/2016 17:58    Procedures Procedures (including critical care time)  Medications Ordered in ED Medications  Rivaroxaban (XARELTO) tablet 15 mg (not administered)  rivaroxaban (XARELTO) Education Kit for DVT/PE patients (not administered)     Initial Impression / Assessment and Plan / ED Course  I have reviewed the triage vital signs and the nursing notes.  Pertinent labs & imaging results that were available during my care of the patient were reviewed by me and considered in my medical decision making (see chart for details).  Clinical Course     Confirmed DVT by ultrasound. Screening labs obtained and revealed no evidence of renal insufficiency and normal platelets. Patient appropriate for Xarelto treatment.  Provided first dose here and will provide patient with education Kit. Rx for Xarelto with close pcp follow up.  The patient is safe for discharge with strict return precautions.   Final Clinical Impressions(s) / ED Diagnoses   Final diagnoses:  Acute deep vein thrombosis (DVT) of femoral vein of left lower extremity (HCC)   Disposition: Discharge  Condition: Good  I have discussed the results, Dx and Tx plan with the patient who expressed understanding and agree(s) with the plan. Discharge instructions discussed at great length. The patient was given strict return precautions who verbalized understanding of the instructions. No further questions at time of discharge.    New Prescriptions   RIVAROXABAN 15 & 20 MG TBPK    Take as directed on package: Start with one '15mg'$  tablet by mouth twice a day with food. On Day 22, switch to one '20mg'$  tablet once a day with food.    Follow Up: Jani Gravel, MD [address] [place] [address] [phone]   in 5-7 days, For close follow up to assess for new DVT      Fatima Blank, MD 02/09/16 2131

## 2016-02-09 NOTE — ED Triage Notes (Signed)
Pt sent by UC for eval of her L lower leg for possible DVT.

## 2016-02-09 NOTE — ED Notes (Signed)
CMS intact with strong Dorsalis Pedal Pulse noted to bilateral feet.

## 2016-02-10 ENCOUNTER — Ambulatory Visit (HOSPITAL_COMMUNITY)
Admission: RE | Admit: 2016-02-10 | Discharge: 2016-02-10 | Disposition: A | Discharge status: Home / Self Care | Payer: Medicare Other | Source: Ambulatory Visit | Attending: Internal Medicine | Admitting: Internal Medicine

## 2016-02-10 ENCOUNTER — Telehealth (INDEPENDENT_AMBULATORY_CARE_PROVIDER_SITE_OTHER): Payer: Self-pay | Admitting: Internal Medicine

## 2016-02-10 DIAGNOSIS — Z9049 Acquired absence of other specified parts of digestive tract: Secondary | ICD-10-CM | POA: Insufficient documentation

## 2016-02-10 DIAGNOSIS — N281 Cyst of kidney, acquired: Secondary | ICD-10-CM | POA: Insufficient documentation

## 2016-02-10 DIAGNOSIS — K8689 Other specified diseases of pancreas: Secondary | ICD-10-CM | POA: Diagnosis not present

## 2016-02-10 DIAGNOSIS — R932 Abnormal findings on diagnostic imaging of liver and biliary tract: Secondary | ICD-10-CM | POA: Insufficient documentation

## 2016-02-11 DIAGNOSIS — M059 Rheumatoid arthritis with rheumatoid factor, unspecified: Secondary | ICD-10-CM | POA: Diagnosis not present

## 2016-02-11 DIAGNOSIS — I82402 Acute embolism and thrombosis of unspecified deep veins of left lower extremity: Secondary | ICD-10-CM | POA: Diagnosis not present

## 2016-02-22 DIAGNOSIS — M0589 Other rheumatoid arthritis with rheumatoid factor of multiple sites: Secondary | ICD-10-CM | POA: Diagnosis not present

## 2016-02-25 NOTE — Telephone Encounter (Signed)
err

## 2016-03-07 DIAGNOSIS — H25811 Combined forms of age-related cataract, right eye: Secondary | ICD-10-CM | POA: Diagnosis not present

## 2016-03-07 DIAGNOSIS — H25011 Cortical age-related cataract, right eye: Secondary | ICD-10-CM | POA: Diagnosis not present

## 2016-03-07 DIAGNOSIS — H2511 Age-related nuclear cataract, right eye: Secondary | ICD-10-CM | POA: Diagnosis not present

## 2016-03-09 DIAGNOSIS — I10 Essential (primary) hypertension: Secondary | ICD-10-CM | POA: Diagnosis not present

## 2016-03-09 DIAGNOSIS — E7800 Pure hypercholesterolemia, unspecified: Secondary | ICD-10-CM | POA: Diagnosis not present

## 2016-03-09 DIAGNOSIS — M059 Rheumatoid arthritis with rheumatoid factor, unspecified: Secondary | ICD-10-CM | POA: Diagnosis not present

## 2016-03-09 DIAGNOSIS — I82402 Acute embolism and thrombosis of unspecified deep veins of left lower extremity: Secondary | ICD-10-CM | POA: Diagnosis not present

## 2016-03-10 DIAGNOSIS — H2512 Age-related nuclear cataract, left eye: Secondary | ICD-10-CM | POA: Diagnosis not present

## 2016-03-22 DIAGNOSIS — M0589 Other rheumatoid arthritis with rheumatoid factor of multiple sites: Secondary | ICD-10-CM | POA: Diagnosis not present

## 2016-03-24 DIAGNOSIS — M25532 Pain in left wrist: Secondary | ICD-10-CM | POA: Diagnosis not present

## 2016-03-24 DIAGNOSIS — M797 Fibromyalgia: Secondary | ICD-10-CM | POA: Diagnosis not present

## 2016-03-24 DIAGNOSIS — M1A09X0 Idiopathic chronic gout, multiple sites, without tophus (tophi): Secondary | ICD-10-CM | POA: Diagnosis not present

## 2016-03-24 DIAGNOSIS — K1379 Other lesions of oral mucosa: Secondary | ICD-10-CM | POA: Diagnosis not present

## 2016-03-24 DIAGNOSIS — M0589 Other rheumatoid arthritis with rheumatoid factor of multiple sites: Secondary | ICD-10-CM | POA: Diagnosis not present

## 2016-03-24 DIAGNOSIS — M150 Primary generalized (osteo)arthritis: Secondary | ICD-10-CM | POA: Diagnosis not present

## 2016-03-24 DIAGNOSIS — Z6831 Body mass index (BMI) 31.0-31.9, adult: Secondary | ICD-10-CM | POA: Diagnosis not present

## 2016-03-24 DIAGNOSIS — M5136 Other intervertebral disc degeneration, lumbar region: Secondary | ICD-10-CM | POA: Diagnosis not present

## 2016-03-24 DIAGNOSIS — M25531 Pain in right wrist: Secondary | ICD-10-CM | POA: Diagnosis not present

## 2016-03-24 DIAGNOSIS — E669 Obesity, unspecified: Secondary | ICD-10-CM | POA: Diagnosis not present

## 2016-03-28 DIAGNOSIS — H2512 Age-related nuclear cataract, left eye: Secondary | ICD-10-CM | POA: Diagnosis not present

## 2016-03-28 DIAGNOSIS — H2511 Age-related nuclear cataract, right eye: Secondary | ICD-10-CM | POA: Diagnosis not present

## 2016-03-28 DIAGNOSIS — H25011 Cortical age-related cataract, right eye: Secondary | ICD-10-CM | POA: Diagnosis not present

## 2016-03-28 DIAGNOSIS — H25812 Combined forms of age-related cataract, left eye: Secondary | ICD-10-CM | POA: Diagnosis not present

## 2016-04-20 DIAGNOSIS — M0589 Other rheumatoid arthritis with rheumatoid factor of multiple sites: Secondary | ICD-10-CM | POA: Diagnosis not present

## 2016-05-09 DIAGNOSIS — Z79899 Other long term (current) drug therapy: Secondary | ICD-10-CM | POA: Diagnosis not present

## 2016-05-09 DIAGNOSIS — M0589 Other rheumatoid arthritis with rheumatoid factor of multiple sites: Secondary | ICD-10-CM | POA: Diagnosis not present

## 2016-05-17 DIAGNOSIS — M47816 Spondylosis without myelopathy or radiculopathy, lumbar region: Secondary | ICD-10-CM | POA: Diagnosis not present

## 2016-05-17 DIAGNOSIS — M546 Pain in thoracic spine: Secondary | ICD-10-CM | POA: Diagnosis not present

## 2016-05-17 DIAGNOSIS — M542 Cervicalgia: Secondary | ICD-10-CM | POA: Diagnosis not present

## 2016-05-17 DIAGNOSIS — M545 Low back pain: Secondary | ICD-10-CM | POA: Diagnosis not present

## 2016-05-17 DIAGNOSIS — M47812 Spondylosis without myelopathy or radiculopathy, cervical region: Secondary | ICD-10-CM | POA: Diagnosis not present

## 2016-05-23 DIAGNOSIS — M0589 Other rheumatoid arthritis with rheumatoid factor of multiple sites: Secondary | ICD-10-CM | POA: Diagnosis not present

## 2016-05-25 DIAGNOSIS — M545 Low back pain: Secondary | ICD-10-CM | POA: Diagnosis not present

## 2016-05-25 DIAGNOSIS — R5382 Chronic fatigue, unspecified: Secondary | ICD-10-CM | POA: Diagnosis not present

## 2016-05-25 DIAGNOSIS — R609 Edema, unspecified: Secondary | ICD-10-CM | POA: Diagnosis not present

## 2016-05-25 DIAGNOSIS — M47896 Other spondylosis, lumbar region: Secondary | ICD-10-CM | POA: Diagnosis not present

## 2016-05-25 DIAGNOSIS — R635 Abnormal weight gain: Secondary | ICD-10-CM | POA: Diagnosis not present

## 2016-05-31 DIAGNOSIS — M150 Primary generalized (osteo)arthritis: Secondary | ICD-10-CM | POA: Diagnosis not present

## 2016-05-31 DIAGNOSIS — M5136 Other intervertebral disc degeneration, lumbar region: Secondary | ICD-10-CM | POA: Diagnosis not present

## 2016-05-31 DIAGNOSIS — M797 Fibromyalgia: Secondary | ICD-10-CM | POA: Diagnosis not present

## 2016-05-31 DIAGNOSIS — M545 Low back pain: Secondary | ICD-10-CM | POA: Diagnosis not present

## 2016-05-31 DIAGNOSIS — M25531 Pain in right wrist: Secondary | ICD-10-CM | POA: Diagnosis not present

## 2016-05-31 DIAGNOSIS — M25532 Pain in left wrist: Secondary | ICD-10-CM | POA: Diagnosis not present

## 2016-05-31 DIAGNOSIS — E663 Overweight: Secondary | ICD-10-CM | POA: Diagnosis not present

## 2016-05-31 DIAGNOSIS — M47896 Other spondylosis, lumbar region: Secondary | ICD-10-CM | POA: Diagnosis not present

## 2016-05-31 DIAGNOSIS — K1379 Other lesions of oral mucosa: Secondary | ICD-10-CM | POA: Diagnosis not present

## 2016-05-31 DIAGNOSIS — M0589 Other rheumatoid arthritis with rheumatoid factor of multiple sites: Secondary | ICD-10-CM | POA: Diagnosis not present

## 2016-05-31 DIAGNOSIS — M1A09X0 Idiopathic chronic gout, multiple sites, without tophus (tophi): Secondary | ICD-10-CM | POA: Diagnosis not present

## 2016-05-31 DIAGNOSIS — Z6829 Body mass index (BMI) 29.0-29.9, adult: Secondary | ICD-10-CM | POA: Diagnosis not present

## 2016-06-02 DIAGNOSIS — M47896 Other spondylosis, lumbar region: Secondary | ICD-10-CM | POA: Diagnosis not present

## 2016-06-02 DIAGNOSIS — M545 Low back pain: Secondary | ICD-10-CM | POA: Diagnosis not present

## 2016-06-03 DIAGNOSIS — R0602 Shortness of breath: Secondary | ICD-10-CM | POA: Diagnosis not present

## 2016-06-03 DIAGNOSIS — R002 Palpitations: Secondary | ICD-10-CM | POA: Diagnosis not present

## 2016-06-03 DIAGNOSIS — I119 Hypertensive heart disease without heart failure: Secondary | ICD-10-CM | POA: Diagnosis not present

## 2016-06-03 DIAGNOSIS — I351 Nonrheumatic aortic (valve) insufficiency: Secondary | ICD-10-CM | POA: Diagnosis not present

## 2016-06-07 DIAGNOSIS — M47896 Other spondylosis, lumbar region: Secondary | ICD-10-CM | POA: Diagnosis not present

## 2016-06-07 DIAGNOSIS — M545 Low back pain: Secondary | ICD-10-CM | POA: Diagnosis not present

## 2016-06-15 ENCOUNTER — Encounter (HOSPITAL_COMMUNITY): Payer: Self-pay | Admitting: Emergency Medicine

## 2016-06-15 ENCOUNTER — Emergency Department (HOSPITAL_COMMUNITY): Payer: Medicare Other

## 2016-06-15 ENCOUNTER — Emergency Department (HOSPITAL_COMMUNITY)
Admission: EM | Admit: 2016-06-15 | Discharge: 2016-06-15 | Disposition: A | Discharge status: Home / Self Care | Payer: Medicare Other | Attending: Emergency Medicine | Admitting: Emergency Medicine

## 2016-06-15 DIAGNOSIS — S4991XA Unspecified injury of right shoulder and upper arm, initial encounter: Secondary | ICD-10-CM | POA: Diagnosis present

## 2016-06-15 DIAGNOSIS — Z853 Personal history of malignant neoplasm of breast: Secondary | ICD-10-CM | POA: Diagnosis not present

## 2016-06-15 DIAGNOSIS — Z87891 Personal history of nicotine dependence: Secondary | ICD-10-CM | POA: Diagnosis not present

## 2016-06-15 DIAGNOSIS — I10 Essential (primary) hypertension: Secondary | ICD-10-CM | POA: Diagnosis not present

## 2016-06-15 DIAGNOSIS — S301XXA Contusion of abdominal wall, initial encounter: Secondary | ICD-10-CM | POA: Diagnosis not present

## 2016-06-15 DIAGNOSIS — S299XXA Unspecified injury of thorax, initial encounter: Secondary | ICD-10-CM | POA: Diagnosis not present

## 2016-06-15 DIAGNOSIS — Y929 Unspecified place or not applicable: Secondary | ICD-10-CM | POA: Diagnosis not present

## 2016-06-15 DIAGNOSIS — Y939 Activity, unspecified: Secondary | ICD-10-CM | POA: Insufficient documentation

## 2016-06-15 DIAGNOSIS — S42291A Other displaced fracture of upper end of right humerus, initial encounter for closed fracture: Secondary | ICD-10-CM | POA: Diagnosis not present

## 2016-06-15 DIAGNOSIS — W010XXA Fall on same level from slipping, tripping and stumbling without subsequent striking against object, initial encounter: Secondary | ICD-10-CM | POA: Diagnosis not present

## 2016-06-15 DIAGNOSIS — S42201A Unspecified fracture of upper end of right humerus, initial encounter for closed fracture: Secondary | ICD-10-CM | POA: Diagnosis not present

## 2016-06-15 DIAGNOSIS — Z79899 Other long term (current) drug therapy: Secondary | ICD-10-CM | POA: Diagnosis not present

## 2016-06-15 DIAGNOSIS — W19XXXA Unspecified fall, initial encounter: Secondary | ICD-10-CM

## 2016-06-15 DIAGNOSIS — Y999 Unspecified external cause status: Secondary | ICD-10-CM | POA: Diagnosis not present

## 2016-06-15 LAB — BASIC METABOLIC PANEL
Anion gap: 11 (ref 5–15)
BUN: 21 mg/dL — ABNORMAL HIGH (ref 6–20)
CALCIUM: 10 mg/dL (ref 8.9–10.3)
CO2: 27 mmol/L (ref 22–32)
CREATININE: 0.93 mg/dL (ref 0.44–1.00)
Chloride: 103 mmol/L (ref 101–111)
GFR calc non Af Amer: >60 mL/min (ref >60)
GLUCOSE: 159 mg/dL — AB (ref 65–99)
Potassium: 3.1 mmol/L — ABNORMAL LOW (ref 3.5–5.1)
Sodium: 141 mmol/L (ref 135–145)

## 2016-06-15 LAB — CBC WITH DIFFERENTIAL/PLATELET
BASOS PCT: 0 %
Basophils Absolute: 0 10*3/uL (ref 0.0–0.1)
EOS PCT: 1 %
Eosinophils Absolute: 0.1 10*3/uL (ref 0.0–0.7)
HEMATOCRIT: 38.1 % (ref 36.0–46.0)
Hemoglobin: 11.6 g/dL — ABNORMAL LOW (ref 12.0–15.0)
Lymphocytes Relative: 14 %
Lymphs Abs: 2.9 10*3/uL (ref 0.7–4.0)
MCH: 27.5 pg (ref 26.0–34.0)
MCHC: 30.4 g/dL (ref 30.0–36.0)
MCV: 90.3 fL (ref 78.0–100.0)
MONO ABS: 1.9 10*3/uL — AB (ref 0.1–1.0)
MONOS PCT: 9 %
NEUTROS ABS: 15.4 10*3/uL — AB (ref 1.7–7.7)
Neutrophils Relative %: 76 %
Platelets: 273 10*3/uL (ref 150–400)
RBC: 4.22 MIL/uL (ref 3.87–5.11)
RDW: 18.1 % — AB (ref 11.5–15.5)
WBC: 20.2 10*3/uL — ABNORMAL HIGH (ref 4.0–10.5)

## 2016-06-15 MED ORDER — HYDROMORPHONE HCL 1 MG/ML IJ SOLN
1.0000 mg | Freq: Once | INTRAMUSCULAR | Status: AC
  Administered 2016-06-15: 1 mg via INTRAMUSCULAR
  Filled 2016-06-15: qty 1

## 2016-06-15 MED ORDER — PROMETHAZINE HCL 12.5 MG PO TABS: 12.5000 mg | ORAL_TABLET | Freq: Four times a day (QID) | ORAL | 0 refills | Status: DC | PRN

## 2016-06-15 MED ORDER — PROMETHAZINE HCL 12.5 MG PO TABS
12.5000 mg | ORAL_TABLET | Freq: Once | ORAL | Status: AC
  Administered 2016-06-15: 12.5 mg via ORAL
  Filled 2016-06-15: qty 1

## 2016-06-15 MED ORDER — HYDROMORPHONE HCL 4 MG PO TABS: 4.0000 mg | ORAL_TABLET | Freq: Four times a day (QID) | ORAL | 0 refills | Status: DC | PRN

## 2016-06-15 MED ORDER — METHOCARBAMOL 500 MG PO TABS
500.0000 mg | ORAL_TABLET | Freq: Once | ORAL | Status: AC
  Administered 2016-06-15: 500 mg via ORAL
  Filled 2016-06-15: qty 1

## 2016-06-15 NOTE — Discharge Instructions (Signed)
You have a broken bone in the right shoulder area called the humerus. Please keep your shoulder immobilizer in place to prevent pain and movement. Please apply ice. Use Tylenol for mild pain, use Dilaudid for more severe pain. This medication may cause drowsiness, and/or constipation. Please use a stool softener while taking this medication. Please use caution when up and about after taking this medication as it may cause drowsiness and dizziness. May use promethazine for nausea if needed.  The x-ray of your ribs and chest are negative for fracture or dislocation. Please ask your physician to recheck this x-ray and about one week if your pain continues in this flank area. Please call Dr. Para March tomorrow morning to arrange an orthopedic appointment in the office.

## 2016-06-15 NOTE — ED Notes (Signed)
EDP made aware of patient blood pressure.

## 2016-06-15 NOTE — ED Triage Notes (Signed)
Pt tripped and fell today. Pt c/o right shoulder and arm pain and right lateral rib pain. Pt guarding right arm. Radial pulses strong.

## 2016-06-15 NOTE — ED Provider Notes (Signed)
[facility] DEPT Provider Note   CSN: [id number] Arrival date & time: 06/15/16  1324     History   Chief Complaint Chief Complaint  Patient presents with  . Arm Pain    HPI [name] is a 66 y.o. female.  Patient is a 66 year old female presents to the emergency department with a complaint of right shoulder pain and right rib area pain.  The patient states that she tripped and fell from a standing position earlier today. She has been unable to use her right shoulder since that time. She has pain with any movement and with palpation of the shoulder. The patient also complains of pain in the right lower rib area. She has pain with any movement. She has pain with laughing or taking a deep breath. It is of note that the patient is on Xarelto. She denies hitting her head or hitting any other areas. She did not have any loss of consciousness. She has not been coughing up any blood, or having any excessive shortness of breath.      Past Medical History:  Diagnosis Date  . Anemia   . Angina   . Arthritis    Rheumatoid  . Cancer ([place])    left breast, cervical cancer  . Colon polyps   . Constipation   . Diarrhea   . GERD (gastroesophageal reflux disease)   . Heart murmur   . Hypercholesterolemia   . Hypertension   . Numbness and tingling in right hand   . Peripheral vascular disease ([place])    cerebrel aneurysm /sp coil  . PONV (postoperative nausea and vomiting)   . Rheumatoid arthritis with rheumatoid factor (HCC)   . Right knee DJD   . Stroke [facility])    jan 2012    Patient Active Problem List   Diagnosis Date Noted  . Venous insufficiency   . Venous intermittent claudication   . Diverticulitis of colon (without mention of hemorrhage)(562.11) 08/26/2013  . Postoperative anemia due to acute blood loss 02/26/2013  . Anemia, unspecified 02/26/2013  . DJD (degenerative joint disease) of knee 02/25/2013  . PONV (postoperative nausea and vomiting)   . Stroke ([place])     . Hypercholesterolemia   . Cancer ([place])   . Colon polyps   . Arthritis   . Rheumatoid arthritis with rheumatoid factor (HCC)   . Right knee DJD   . Asthenia 08/14/2012  . Cerebral aneurysm, nonruptured 08/14/2012  . TIA (transient ischemic attack) 08/14/2012  . RA (rheumatoid arthritis) 10/27/2011  . High cholesterol 10/27/2011  . Hypertension 10/27/2011  . GERD (gastroesophageal reflux disease) 10/27/2011    Past Surgical History:  Procedure Laterality Date  . ABDOMINAL HYSTERECTOMY  1977  . APPENDECTOMY  1964  . BILATERAL OOPHORECTOMY    . CHOLECYSTECTOMY    . COLONOSCOPY  12/09/2010   Procedure: COLONOSCOPY;  Surgeon: Rogene Houston, MD;  [facility];  Service: Endoscopy;  Laterality: N/A;  . COLONOSCOPY N/A 06/26/2015   Procedure: COLONOSCOPY;  Surgeon: Rogene Houston, MD;  [facility];  Service: Endoscopy;  Laterality: N/A;  1:05-moved to [address] to notify pt  . ESOPHAGOGASTRODUODENOSCOPY N/A 06/26/2015   Procedure: ESOPHAGOGASTRODUODENOSCOPY (EGD);  Surgeon: Rogene Houston, MD;  [facility];  Service: Endoscopy;  Laterality: N/A;  . GIVENS CAPSULE STUDY N/A 08/20/2015   Procedure: GIVENS CAPSULE STUDY;  Surgeon: Rogene Houston, MD;  [facility];  Service: Endoscopy;  Laterality: N/A;  730  .  left breast lumpectomy  2004  . left knee arthroscopy    . Right brain aneurysm  04/2010   Coiled   . right wrist surgery  1999   synevectomy  . TOTAL KNEE ARTHROPLASTY Right 02/25/2013   Procedure: TOTAL KNEE ARTHROPLASTY;  Surgeon: Lorn Junes, MD;  Location: [facility];  Service: Orthopedics;  Laterality: Right;    OB History    Gravida Para Term Preterm AB Living             0   SAB TAB Ectopic Multiple Live Births                   Home Medications    Prior to Admission medications   Medication Sig Start Date End Date Taking? Authorizing Provider  allopurinol (ZYLOPRIM) 100 MG tablet Take 300 mg by mouth daily.      Historical Provider, MD  aspirin 325 MG tablet Take 1 tablet (325 mg total) by mouth daily. 07/06/15   Rogene Houston, MD  Calcium Carbonate-Vit D-Min (CALTRATE 600+D PLUS) 600-400 MG-UNIT per tablet Chew 1 tablet by mouth 2 (two) times daily.      Historical Provider, MD  Certolizumab Pegol (CIMZIA PREFILLED) 2 X 200 MG/ML KIT Inject 200 mg into the skin every 30 (thirty) days.    Historical Provider, MD  cetirizine (ZYRTEC) 10 MG tablet Take 10 mg by mouth daily.    Historical Provider, MD  Cholecalciferol (VITAMIN D) 2000 UNITS tablet Take 2,000 Units by mouth daily.     Historical Provider, MD  DULoxetine (CYMBALTA) 60 MG capsule Take 60 mg by mouth daily.    Historical Provider, MD  ferrous sulfate 325 (65 FE) MG tablet Take 325 mg by mouth daily with breakfast.    Historical Provider, MD  [place] Take 2 each by mouth daily.     Historical Provider, MD  hydroxychloroquine (PLAQUENIL) 200 MG tablet Take 200 mg by mouth daily.     Historical Provider, MD  losartan-hydrochlorothiazide (HYZAAR) 100-12.5 MG tablet Take 1 tablet by mouth daily.    Historical Provider, MD  magnesium oxide (MAG-OX) 400 MG tablet Take 400 mg by mouth daily.      Historical Provider, MD  Nebivolol HCl (BYSTOLIC) 20 MG TABS Take 20 mg by mouth daily.     Historical Provider, MD  omeprazole (PRILOSEC) 40 MG capsule TAKE (1) CAPSULE BY MOUTH ONCE DAILY. 07/21/14   Rogene Houston, MD  Polyethyl Glycol-Propyl Glycol (SYSTANE) 0.4-0.3 % GEL Apply 1 application to eye daily as needed. For dry eye relief    Historical Provider, MD  Rivaroxaban 15 & 20 MG TBPK Take as directed on package: Start with one 43m tablet by mouth twice a day with food. On Day 22, switch to one 230mtablet once a day with food. 02/09/16   PeFatima BlankMD  traMADol (ULTRAM) 50 MG tablet Take 50 mg by mouth as needed for moderate pain.     Historical Provider, MD    Family History Family History  Problem Relation Age of Onset    . Hypertension Father   . Heart attack Father   . Heart disease Maternal Uncle   . Colon cancer Neg Hx     Social History Social History  Substance Use Topics  . Smoking status: Former Smoker    Packs/day: 2.00    Years: 15.00    Quit date: 02/20/1984  . Smokeless tobacco: Never Used  Comment: Quit 1986  . Alcohol use No     Allergies   Aurothioglucose [solganal]; Calcium channel blockers; Feldene [piroxicam]; Hydralazine; Imdur [isosorbide mononitrate]; Imuran [azathioprine]; Methotrexate derivatives; Neomycin; Nickel; Thimerosal; Percocet [oxycodone-acetaminophen]; Vioxx [rofecoxib]; Arava [leflunomide]; Asacol [mesalamine]; Hydrocodone; Iohexol; Sulfa antibiotics; Latex; and Nsaids   Review of Systems Review of Systems  Respiratory:       Chest wall pain.  Musculoskeletal: Positive for arthralgias.  All other systems reviewed and are negative.    Physical Exam Updated Vital Signs BP (!) 141/58 (BP Location: Right Arm)   Pulse (!) 58   Temp 99.1 F (37.3 C) (Oral)   Resp 19   Ht _0  (1.702 m)   Wt 85.7 kg   SpO2 100%   BMI 29.60 kg/m   Physical Exam  Constitutional: She is oriented to person, place, and time. She appears well-developed and well-nourished.  Non-toxic appearance.  HENT:  Head: Normocephalic.  Right Ear: Tympanic membrane and external ear normal.  Left Ear: Tympanic membrane and external ear normal.  Eyes: EOM and lids are normal. Pupils are equal, round, and reactive to light.  Neck: Normal range of motion. Neck supple. Carotid bruit is not present.  Cardiovascular: Normal rate, regular rhythm, normal heart sounds, intact distal pulses and normal pulses.   Pulmonary/Chest: Breath sounds normal. No respiratory distress. She exhibits tenderness. She exhibits no deformity.    Abdominal: Soft. Bowel sounds are normal. There is no tenderness. There is no guarding.  Musculoskeletal:       Right shoulder: She exhibits decreased range of  motion, tenderness, pain and spasm.  No pain of the right elbow, wrist, or fingers. Cap refill less than 2 sec. Radial pulse 2+.  Lymphadenopathy:       Head (right side): No submandibular adenopathy present.       Head (left side): No submandibular adenopathy present.    She has no cervical adenopathy.  Neurological: She is alert and oriented to person, place, and time. She has normal strength. No cranial nerve deficit or sensory deficit.  Skin: Skin is warm and dry.  Psychiatric: She has a normal mood and affect. Her speech is normal.  Nursing note and vitals reviewed.    ED Treatments / Results  Labs (all labs ordered are listed, but only abnormal results are displayed) Labs Reviewed  CBC WITH DIFFERENTIAL/PLATELET - Abnormal; Notable for the following:       Result Value   WBC 20.2 (*)    Hemoglobin 11.6 (*)    RDW 18.1 (*)    Neutro Abs 15.4 (*)    Monocytes Absolute 1.9 (*)    All other components within normal limits  BASIC METABOLIC PANEL - Abnormal; Notable for the following:    Potassium 3.1 (*)    Glucose, Bld 159 (*)    BUN 21 (*)    All other components within normal limits    EKG  EKG Interpretation None       Radiology Dg Ribs Unilateral W/chest Right  Result Date: 06/15/2016 CLINICAL DATA:  Status post fall today striking the right shoulder and right ribcage. EXAM: RIGHT RIBS AND CHEST - 3+ VIEW COMPARISON:  PA and lateral chest x-ray of December 10, 2014. FINDINGS: The lungs are reasonably well inflated. There is no pneumothorax or pleural effusion. The heart and pulmonary vascularity are normal. There is calcification in the wall of the aortic arch. Numerous surgical clips are present in the left breast and left axillary region. Patient does  have a history of breast malignancy. Right rib detail images reveal no acute displaced fracture. There is a known fracture of the right humeral head in the region of the greater tuberosity. There may be an impacted  fracture of the humeral neck. IMPRESSION: No acute right rib fracture is observed. There is no acute cardiopulmonary abnormality. Thoracic aortic atherosclerosis. Electronically Signed   By: David  Martinique M.D.   On: 06/15/2016 14:49   Dg Shoulder Right  Result Date: 06/15/2016 CLINICAL DATA:  Status post fall today striking the right shoulder and right chest wall. EXAM: RIGHT SHOULDER - 2+ VIEW COMPARISON:  Chest x-ray of December 10, 2014 which included portions of the right shoulder. FINDINGS: Positioning is quite limited and 1 of the views is nondiagnostic. The patient has sustained an acute fracture of the humerus in the region of the greater tuberosity. I cannot exclude a subcapital fracture on the limited views available. The glenohumeral joint and AC joint appear intact as does the subacromial subdeltoid space. No scapular or clavicular fracture is observed where visualized. IMPRESSION: Findings compatible with an acute humeral head fracture centered in the greater tuberosity region. I cannot exclude a mildly impacted subcapital fracture. A better positioned shoulder series or a CT scan are recommended. Electronically Signed   By: David  Martinique M.D.   On: 06/15/2016 14:47    Procedures Procedures (including critical care time) FRACTURE CARE Patient is a 67 year old female who fell earlier today and sustained a fracture of the right humeral head. I've discussed the fracture with the patient in terms which he understands. I've also discussed the procedure for immobilization with patient in terms which he understands.  Patient identified by arm band. The radial pulse is identified at 2+. The capillary refill is less than 2 seconds. The patient is fitted with a shoulder immobilizer. Following the positioning of the shoulder immobilizer, the radial pulse remains 2+, the capillary refill remains less than 2 seconds. Minimal change in the pain following the administration of the immobilizer. The patient  will be treated with promethazine for nausea and dilaudid for pain.  Ice pack applied.   Medications Ordered in ED Medications  HYDROmorphone (DILAUDID) injection 1 mg (1 mg Intramuscular Given 06/15/16 1610)  promethazine (PHENERGAN) tablet 12.5 mg (12.5 mg Oral Given 06/15/16 1609)  methocarbamol (ROBAXIN) tablet 500 mg (500 mg Oral Given 06/15/16 1609)     Initial Impression / Assessment and Plan / ED Course  I have reviewed the triage vital signs and the nursing notes.  Pertinent labs & imaging results that were available during my care of the patient were reviewed by me and considered in my medical decision making (see chart for details).       Final Clinical Impressions(s) / ED Diagnoses MDM  nal Clinical Impressions(s) / ED Diagnoses MDM Vital signs reviewed. X-ray of the right shoulder reveals a fracture of the greater trochanter area, and possibly a fracture of the humeral neck. No other fracture appreciated. X-ray of the ribs and chest are negative for fracture at this time.  I discussed the fracture with the patient patient is fitted with a shoulder immobilizer. Patient is treated with medication for pain and an ice pack has been provided. I discussed the case with Dr. Ar[facility] office. The patient will call in the morning to set up an appointment.  It is of note that the patient is on Xeralto. I have given the patient strict instructions to go to the [facility] emergency department if  she should notice excessive bleeding or swelling or bruising on. The patient acknowledges understanding of all of these discharge instructions and is in agreement.   Final diagnoses:  Fall  Closed fracture of proximal end of right humerus, unspecified fracture morphology, initial encounter  Contusion, flank, initial encounter    New Prescriptions New Prescriptions   HYDROMORPHONE (DILAUDID) 4 MG TABLET    Take 1 tablet (4 mg total) by mouth every 6 (six) hours as needed for severe  pain.   PROMETHAZINE (PHENERGAN) 12.5 MG TABLET    Take 1 tablet (12.5 mg total) by mouth every 6 (six) hours as needed for nausea or vomiting.     Lily Kocher, PA-C 06/15/16 1736    Milton Ferguson, MD 06/16/16 0005

## 2016-06-16 DIAGNOSIS — S42294A Other nondisplaced fracture of upper end of right humerus, initial encounter for closed fracture: Secondary | ICD-10-CM | POA: Diagnosis not present

## 2016-06-27 DIAGNOSIS — S42294D Other nondisplaced fracture of upper end of right humerus, subsequent encounter for fracture with routine healing: Secondary | ICD-10-CM | POA: Diagnosis not present

## 2016-06-28 DIAGNOSIS — M0589 Other rheumatoid arthritis with rheumatoid factor of multiple sites: Secondary | ICD-10-CM | POA: Diagnosis not present

## 2016-07-06 DIAGNOSIS — W19XXXD Unspecified fall, subsequent encounter: Secondary | ICD-10-CM | POA: Diagnosis not present

## 2016-07-06 DIAGNOSIS — Z7901 Long term (current) use of anticoagulants: Secondary | ICD-10-CM | POA: Diagnosis not present

## 2016-07-06 DIAGNOSIS — Z9181 History of falling: Secondary | ICD-10-CM | POA: Diagnosis not present

## 2016-07-06 DIAGNOSIS — S42201D Unspecified fracture of upper end of right humerus, subsequent encounter for fracture with routine healing: Secondary | ICD-10-CM | POA: Diagnosis not present

## 2016-07-07 DIAGNOSIS — S42294D Other nondisplaced fracture of upper end of right humerus, subsequent encounter for fracture with routine healing: Secondary | ICD-10-CM | POA: Diagnosis not present

## 2016-07-08 DIAGNOSIS — Z7901 Long term (current) use of anticoagulants: Secondary | ICD-10-CM | POA: Diagnosis not present

## 2016-07-08 DIAGNOSIS — Z9181 History of falling: Secondary | ICD-10-CM | POA: Diagnosis not present

## 2016-07-08 DIAGNOSIS — W19XXXD Unspecified fall, subsequent encounter: Secondary | ICD-10-CM | POA: Diagnosis not present

## 2016-07-08 DIAGNOSIS — S42201D Unspecified fracture of upper end of right humerus, subsequent encounter for fracture with routine healing: Secondary | ICD-10-CM | POA: Diagnosis not present

## 2016-07-12 DIAGNOSIS — Z7901 Long term (current) use of anticoagulants: Secondary | ICD-10-CM | POA: Diagnosis not present

## 2016-07-12 DIAGNOSIS — Z9181 History of falling: Secondary | ICD-10-CM | POA: Diagnosis not present

## 2016-07-12 DIAGNOSIS — S42201D Unspecified fracture of upper end of right humerus, subsequent encounter for fracture with routine healing: Secondary | ICD-10-CM | POA: Diagnosis not present

## 2016-07-12 DIAGNOSIS — W19XXXD Unspecified fall, subsequent encounter: Secondary | ICD-10-CM | POA: Diagnosis not present

## 2016-07-14 DIAGNOSIS — Z9181 History of falling: Secondary | ICD-10-CM | POA: Diagnosis not present

## 2016-07-14 DIAGNOSIS — W19XXXD Unspecified fall, subsequent encounter: Secondary | ICD-10-CM | POA: Diagnosis not present

## 2016-07-14 DIAGNOSIS — Z7901 Long term (current) use of anticoagulants: Secondary | ICD-10-CM | POA: Diagnosis not present

## 2016-07-14 DIAGNOSIS — S42201D Unspecified fracture of upper end of right humerus, subsequent encounter for fracture with routine healing: Secondary | ICD-10-CM | POA: Diagnosis not present

## 2016-07-19 DIAGNOSIS — Z7901 Long term (current) use of anticoagulants: Secondary | ICD-10-CM | POA: Diagnosis not present

## 2016-07-19 DIAGNOSIS — S42201D Unspecified fracture of upper end of right humerus, subsequent encounter for fracture with routine healing: Secondary | ICD-10-CM | POA: Diagnosis not present

## 2016-07-19 DIAGNOSIS — W19XXXD Unspecified fall, subsequent encounter: Secondary | ICD-10-CM | POA: Diagnosis not present

## 2016-07-19 DIAGNOSIS — Z9181 History of falling: Secondary | ICD-10-CM | POA: Diagnosis not present

## 2016-07-21 DIAGNOSIS — W19XXXD Unspecified fall, subsequent encounter: Secondary | ICD-10-CM | POA: Diagnosis not present

## 2016-07-21 DIAGNOSIS — Z9181 History of falling: Secondary | ICD-10-CM | POA: Diagnosis not present

## 2016-07-21 DIAGNOSIS — Z7901 Long term (current) use of anticoagulants: Secondary | ICD-10-CM | POA: Diagnosis not present

## 2016-07-21 DIAGNOSIS — S42201D Unspecified fracture of upper end of right humerus, subsequent encounter for fracture with routine healing: Secondary | ICD-10-CM | POA: Diagnosis not present

## 2016-07-22 DIAGNOSIS — W19XXXD Unspecified fall, subsequent encounter: Secondary | ICD-10-CM | POA: Diagnosis not present

## 2016-07-22 DIAGNOSIS — Z7901 Long term (current) use of anticoagulants: Secondary | ICD-10-CM | POA: Diagnosis not present

## 2016-07-22 DIAGNOSIS — Z9181 History of falling: Secondary | ICD-10-CM | POA: Diagnosis not present

## 2016-07-22 DIAGNOSIS — S42201D Unspecified fracture of upper end of right humerus, subsequent encounter for fracture with routine healing: Secondary | ICD-10-CM | POA: Diagnosis not present

## 2016-07-25 DIAGNOSIS — W19XXXD Unspecified fall, subsequent encounter: Secondary | ICD-10-CM | POA: Diagnosis not present

## 2016-07-25 DIAGNOSIS — Z7901 Long term (current) use of anticoagulants: Secondary | ICD-10-CM | POA: Diagnosis not present

## 2016-07-25 DIAGNOSIS — S42201D Unspecified fracture of upper end of right humerus, subsequent encounter for fracture with routine healing: Secondary | ICD-10-CM | POA: Diagnosis not present

## 2016-07-25 DIAGNOSIS — Z9181 History of falling: Secondary | ICD-10-CM | POA: Diagnosis not present

## 2016-07-26 DIAGNOSIS — M0589 Other rheumatoid arthritis with rheumatoid factor of multiple sites: Secondary | ICD-10-CM | POA: Diagnosis not present

## 2016-07-28 DIAGNOSIS — S42294D Other nondisplaced fracture of upper end of right humerus, subsequent encounter for fracture with routine healing: Secondary | ICD-10-CM | POA: Diagnosis not present

## 2016-08-02 DIAGNOSIS — W19XXXD Unspecified fall, subsequent encounter: Secondary | ICD-10-CM | POA: Diagnosis not present

## 2016-08-02 DIAGNOSIS — Z9181 History of falling: Secondary | ICD-10-CM | POA: Diagnosis not present

## 2016-08-02 DIAGNOSIS — S42201D Unspecified fracture of upper end of right humerus, subsequent encounter for fracture with routine healing: Secondary | ICD-10-CM | POA: Diagnosis not present

## 2016-08-02 DIAGNOSIS — Z7901 Long term (current) use of anticoagulants: Secondary | ICD-10-CM | POA: Diagnosis not present

## 2016-08-08 DIAGNOSIS — M25511 Pain in right shoulder: Secondary | ICD-10-CM | POA: Diagnosis not present

## 2016-08-08 DIAGNOSIS — S42294D Other nondisplaced fracture of upper end of right humerus, subsequent encounter for fracture with routine healing: Secondary | ICD-10-CM | POA: Diagnosis not present

## 2016-08-08 DIAGNOSIS — R531 Weakness: Secondary | ICD-10-CM | POA: Diagnosis not present

## 2016-08-08 DIAGNOSIS — M25611 Stiffness of right shoulder, not elsewhere classified: Secondary | ICD-10-CM | POA: Diagnosis not present

## 2016-08-10 DIAGNOSIS — M25611 Stiffness of right shoulder, not elsewhere classified: Secondary | ICD-10-CM | POA: Diagnosis not present

## 2016-08-10 DIAGNOSIS — M25511 Pain in right shoulder: Secondary | ICD-10-CM | POA: Diagnosis not present

## 2016-08-10 DIAGNOSIS — S42294D Other nondisplaced fracture of upper end of right humerus, subsequent encounter for fracture with routine healing: Secondary | ICD-10-CM | POA: Diagnosis not present

## 2016-08-10 DIAGNOSIS — R531 Weakness: Secondary | ICD-10-CM | POA: Diagnosis not present

## 2016-08-15 DIAGNOSIS — M25511 Pain in right shoulder: Secondary | ICD-10-CM | POA: Diagnosis not present

## 2016-08-15 DIAGNOSIS — M25611 Stiffness of right shoulder, not elsewhere classified: Secondary | ICD-10-CM | POA: Diagnosis not present

## 2016-08-15 DIAGNOSIS — S42294D Other nondisplaced fracture of upper end of right humerus, subsequent encounter for fracture with routine healing: Secondary | ICD-10-CM | POA: Diagnosis not present

## 2016-08-15 DIAGNOSIS — R531 Weakness: Secondary | ICD-10-CM | POA: Diagnosis not present

## 2016-08-18 DIAGNOSIS — M25511 Pain in right shoulder: Secondary | ICD-10-CM | POA: Diagnosis not present

## 2016-08-18 DIAGNOSIS — M25611 Stiffness of right shoulder, not elsewhere classified: Secondary | ICD-10-CM | POA: Diagnosis not present

## 2016-08-18 DIAGNOSIS — R531 Weakness: Secondary | ICD-10-CM | POA: Diagnosis not present

## 2016-08-18 DIAGNOSIS — S42294D Other nondisplaced fracture of upper end of right humerus, subsequent encounter for fracture with routine healing: Secondary | ICD-10-CM | POA: Diagnosis not present

## 2016-08-19 DIAGNOSIS — M1000 Idiopathic gout, unspecified site: Secondary | ICD-10-CM | POA: Diagnosis not present

## 2016-08-19 DIAGNOSIS — M859 Disorder of bone density and structure, unspecified: Secondary | ICD-10-CM | POA: Diagnosis not present

## 2016-08-19 DIAGNOSIS — I10 Essential (primary) hypertension: Secondary | ICD-10-CM | POA: Diagnosis not present

## 2016-08-19 DIAGNOSIS — S42201D Unspecified fracture of upper end of right humerus, subsequent encounter for fracture with routine healing: Secondary | ICD-10-CM | POA: Diagnosis not present

## 2016-08-19 DIAGNOSIS — R739 Hyperglycemia, unspecified: Secondary | ICD-10-CM | POA: Diagnosis not present

## 2016-08-19 DIAGNOSIS — M8580 Other specified disorders of bone density and structure, unspecified site: Secondary | ICD-10-CM | POA: Diagnosis not present

## 2016-08-19 DIAGNOSIS — M059 Rheumatoid arthritis with rheumatoid factor, unspecified: Secondary | ICD-10-CM | POA: Diagnosis not present

## 2016-08-22 DIAGNOSIS — M25611 Stiffness of right shoulder, not elsewhere classified: Secondary | ICD-10-CM | POA: Diagnosis not present

## 2016-08-22 DIAGNOSIS — R531 Weakness: Secondary | ICD-10-CM | POA: Diagnosis not present

## 2016-08-22 DIAGNOSIS — S42294D Other nondisplaced fracture of upper end of right humerus, subsequent encounter for fracture with routine healing: Secondary | ICD-10-CM | POA: Diagnosis not present

## 2016-08-22 DIAGNOSIS — M25511 Pain in right shoulder: Secondary | ICD-10-CM | POA: Diagnosis not present

## 2016-08-23 DIAGNOSIS — M0589 Other rheumatoid arthritis with rheumatoid factor of multiple sites: Secondary | ICD-10-CM | POA: Diagnosis not present

## 2016-08-24 DIAGNOSIS — H10413 Chronic giant papillary conjunctivitis, bilateral: Secondary | ICD-10-CM | POA: Diagnosis not present

## 2016-08-24 DIAGNOSIS — Z79899 Other long term (current) drug therapy: Secondary | ICD-10-CM | POA: Diagnosis not present

## 2016-08-24 DIAGNOSIS — M069 Rheumatoid arthritis, unspecified: Secondary | ICD-10-CM | POA: Diagnosis not present

## 2016-08-24 DIAGNOSIS — H04123 Dry eye syndrome of bilateral lacrimal glands: Secondary | ICD-10-CM | POA: Diagnosis not present

## 2016-08-24 DIAGNOSIS — Z961 Presence of intraocular lens: Secondary | ICD-10-CM | POA: Diagnosis not present

## 2016-08-24 DIAGNOSIS — Z09 Encounter for follow-up examination after completed treatment for conditions other than malignant neoplasm: Secondary | ICD-10-CM | POA: Diagnosis not present

## 2016-08-25 DIAGNOSIS — M25511 Pain in right shoulder: Secondary | ICD-10-CM | POA: Diagnosis not present

## 2016-08-25 DIAGNOSIS — S42294D Other nondisplaced fracture of upper end of right humerus, subsequent encounter for fracture with routine healing: Secondary | ICD-10-CM | POA: Diagnosis not present

## 2016-08-25 DIAGNOSIS — M25611 Stiffness of right shoulder, not elsewhere classified: Secondary | ICD-10-CM | POA: Diagnosis not present

## 2016-08-25 DIAGNOSIS — R531 Weakness: Secondary | ICD-10-CM | POA: Diagnosis not present

## 2016-08-29 DIAGNOSIS — M25511 Pain in right shoulder: Secondary | ICD-10-CM | POA: Diagnosis not present

## 2016-08-31 DIAGNOSIS — M25532 Pain in left wrist: Secondary | ICD-10-CM | POA: Diagnosis not present

## 2016-08-31 DIAGNOSIS — E663 Overweight: Secondary | ICD-10-CM | POA: Diagnosis not present

## 2016-08-31 DIAGNOSIS — M150 Primary generalized (osteo)arthritis: Secondary | ICD-10-CM | POA: Diagnosis not present

## 2016-08-31 DIAGNOSIS — Z6828 Body mass index (BMI) 28.0-28.9, adult: Secondary | ICD-10-CM | POA: Diagnosis not present

## 2016-08-31 DIAGNOSIS — K1379 Other lesions of oral mucosa: Secondary | ICD-10-CM | POA: Diagnosis not present

## 2016-08-31 DIAGNOSIS — M25531 Pain in right wrist: Secondary | ICD-10-CM | POA: Diagnosis not present

## 2016-08-31 DIAGNOSIS — M1A09X0 Idiopathic chronic gout, multiple sites, without tophus (tophi): Secondary | ICD-10-CM | POA: Diagnosis not present

## 2016-08-31 DIAGNOSIS — M5136 Other intervertebral disc degeneration, lumbar region: Secondary | ICD-10-CM | POA: Diagnosis not present

## 2016-08-31 DIAGNOSIS — M0589 Other rheumatoid arthritis with rheumatoid factor of multiple sites: Secondary | ICD-10-CM | POA: Diagnosis not present

## 2016-08-31 DIAGNOSIS — M797 Fibromyalgia: Secondary | ICD-10-CM | POA: Diagnosis not present

## 2016-09-01 DIAGNOSIS — I119 Hypertensive heart disease without heart failure: Secondary | ICD-10-CM | POA: Diagnosis not present

## 2016-09-01 DIAGNOSIS — I351 Nonrheumatic aortic (valve) insufficiency: Secondary | ICD-10-CM | POA: Diagnosis not present

## 2016-09-01 DIAGNOSIS — R002 Palpitations: Secondary | ICD-10-CM | POA: Diagnosis not present

## 2016-09-01 DIAGNOSIS — R0602 Shortness of breath: Secondary | ICD-10-CM | POA: Diagnosis not present

## 2016-09-19 DIAGNOSIS — E559 Vitamin D deficiency, unspecified: Secondary | ICD-10-CM | POA: Diagnosis not present

## 2016-09-19 DIAGNOSIS — R739 Hyperglycemia, unspecified: Secondary | ICD-10-CM | POA: Diagnosis not present

## 2016-09-19 DIAGNOSIS — I10 Essential (primary) hypertension: Secondary | ICD-10-CM | POA: Diagnosis not present

## 2016-09-19 DIAGNOSIS — M8580 Other specified disorders of bone density and structure, unspecified site: Secondary | ICD-10-CM | POA: Diagnosis not present

## 2016-09-20 DIAGNOSIS — M0589 Other rheumatoid arthritis with rheumatoid factor of multiple sites: Secondary | ICD-10-CM | POA: Diagnosis not present

## 2016-09-22 DIAGNOSIS — D649 Anemia, unspecified: Secondary | ICD-10-CM | POA: Diagnosis not present

## 2016-09-22 DIAGNOSIS — I351 Nonrheumatic aortic (valve) insufficiency: Secondary | ICD-10-CM | POA: Diagnosis not present

## 2016-09-22 DIAGNOSIS — L299 Pruritus, unspecified: Secondary | ICD-10-CM | POA: Diagnosis not present

## 2016-09-22 DIAGNOSIS — E118 Type 2 diabetes mellitus with unspecified complications: Secondary | ICD-10-CM | POA: Diagnosis not present

## 2016-09-22 DIAGNOSIS — I82402 Acute embolism and thrombosis of unspecified deep veins of left lower extremity: Secondary | ICD-10-CM | POA: Diagnosis not present

## 2016-09-22 DIAGNOSIS — D638 Anemia in other chronic diseases classified elsewhere: Secondary | ICD-10-CM | POA: Diagnosis not present

## 2016-09-22 DIAGNOSIS — I6523 Occlusion and stenosis of bilateral carotid arteries: Secondary | ICD-10-CM | POA: Diagnosis not present

## 2016-09-22 DIAGNOSIS — M810 Age-related osteoporosis without current pathological fracture: Secondary | ICD-10-CM | POA: Diagnosis not present

## 2016-09-22 DIAGNOSIS — D72829 Elevated white blood cell count, unspecified: Secondary | ICD-10-CM | POA: Diagnosis not present

## 2016-09-22 DIAGNOSIS — D509 Iron deficiency anemia, unspecified: Secondary | ICD-10-CM | POA: Diagnosis not present

## 2016-09-22 DIAGNOSIS — M159 Polyosteoarthritis, unspecified: Secondary | ICD-10-CM | POA: Diagnosis not present

## 2016-10-06 DIAGNOSIS — E118 Type 2 diabetes mellitus with unspecified complications: Secondary | ICD-10-CM | POA: Diagnosis not present

## 2016-10-06 DIAGNOSIS — I10 Essential (primary) hypertension: Secondary | ICD-10-CM | POA: Diagnosis not present

## 2016-10-06 DIAGNOSIS — D72829 Elevated white blood cell count, unspecified: Secondary | ICD-10-CM | POA: Diagnosis not present

## 2016-10-06 DIAGNOSIS — D649 Anemia, unspecified: Secondary | ICD-10-CM | POA: Diagnosis not present

## 2016-10-10 DIAGNOSIS — Z1231 Encounter for screening mammogram for malignant neoplasm of breast: Secondary | ICD-10-CM | POA: Diagnosis not present

## 2016-10-18 DIAGNOSIS — Z79899 Other long term (current) drug therapy: Secondary | ICD-10-CM | POA: Diagnosis not present

## 2016-10-18 DIAGNOSIS — M0589 Other rheumatoid arthritis with rheumatoid factor of multiple sites: Secondary | ICD-10-CM | POA: Diagnosis not present

## 2016-10-19 DIAGNOSIS — I6523 Occlusion and stenosis of bilateral carotid arteries: Secondary | ICD-10-CM | POA: Diagnosis not present

## 2016-10-19 DIAGNOSIS — R0602 Shortness of breath: Secondary | ICD-10-CM | POA: Diagnosis not present

## 2016-10-19 DIAGNOSIS — I351 Nonrheumatic aortic (valve) insufficiency: Secondary | ICD-10-CM | POA: Diagnosis not present

## 2016-11-16 DIAGNOSIS — M0589 Other rheumatoid arthritis with rheumatoid factor of multiple sites: Secondary | ICD-10-CM | POA: Diagnosis not present

## 2016-12-01 DIAGNOSIS — M150 Primary generalized (osteo)arthritis: Secondary | ICD-10-CM | POA: Diagnosis not present

## 2016-12-01 DIAGNOSIS — M25532 Pain in left wrist: Secondary | ICD-10-CM | POA: Diagnosis not present

## 2016-12-01 DIAGNOSIS — M797 Fibromyalgia: Secondary | ICD-10-CM | POA: Diagnosis not present

## 2016-12-01 DIAGNOSIS — K1379 Other lesions of oral mucosa: Secondary | ICD-10-CM | POA: Diagnosis not present

## 2016-12-01 DIAGNOSIS — Z6826 Body mass index (BMI) 26.0-26.9, adult: Secondary | ICD-10-CM | POA: Diagnosis not present

## 2016-12-01 DIAGNOSIS — M0589 Other rheumatoid arthritis with rheumatoid factor of multiple sites: Secondary | ICD-10-CM | POA: Diagnosis not present

## 2016-12-01 DIAGNOSIS — M5136 Other intervertebral disc degeneration, lumbar region: Secondary | ICD-10-CM | POA: Diagnosis not present

## 2016-12-01 DIAGNOSIS — E663 Overweight: Secondary | ICD-10-CM | POA: Diagnosis not present

## 2016-12-01 DIAGNOSIS — M1A09X0 Idiopathic chronic gout, multiple sites, without tophus (tophi): Secondary | ICD-10-CM | POA: Diagnosis not present

## 2016-12-01 DIAGNOSIS — M25531 Pain in right wrist: Secondary | ICD-10-CM | POA: Diagnosis not present

## 2016-12-08 DIAGNOSIS — R0602 Shortness of breath: Secondary | ICD-10-CM | POA: Diagnosis not present

## 2016-12-08 DIAGNOSIS — I951 Orthostatic hypotension: Secondary | ICD-10-CM | POA: Diagnosis not present

## 2016-12-08 DIAGNOSIS — I350 Nonrheumatic aortic (valve) stenosis: Secondary | ICD-10-CM | POA: Diagnosis not present

## 2016-12-08 DIAGNOSIS — I119 Hypertensive heart disease without heart failure: Secondary | ICD-10-CM | POA: Diagnosis not present

## 2016-12-14 DIAGNOSIS — M0589 Other rheumatoid arthritis with rheumatoid factor of multiple sites: Secondary | ICD-10-CM | POA: Diagnosis not present

## 2017-01-02 DIAGNOSIS — I10 Essential (primary) hypertension: Secondary | ICD-10-CM | POA: Diagnosis not present

## 2017-01-02 DIAGNOSIS — E118 Type 2 diabetes mellitus with unspecified complications: Secondary | ICD-10-CM | POA: Diagnosis not present

## 2017-01-10 DIAGNOSIS — M25551 Pain in right hip: Secondary | ICD-10-CM | POA: Diagnosis not present

## 2017-01-10 DIAGNOSIS — M25552 Pain in left hip: Secondary | ICD-10-CM | POA: Diagnosis not present

## 2017-01-10 DIAGNOSIS — E118 Type 2 diabetes mellitus with unspecified complications: Secondary | ICD-10-CM | POA: Diagnosis not present

## 2017-01-10 DIAGNOSIS — D649 Anemia, unspecified: Secondary | ICD-10-CM | POA: Diagnosis not present

## 2017-01-11 DIAGNOSIS — Z79899 Other long term (current) drug therapy: Secondary | ICD-10-CM | POA: Diagnosis not present

## 2017-01-11 DIAGNOSIS — M0589 Other rheumatoid arthritis with rheumatoid factor of multiple sites: Secondary | ICD-10-CM | POA: Diagnosis not present

## 2017-01-18 DIAGNOSIS — R042 Hemoptysis: Secondary | ICD-10-CM | POA: Diagnosis not present

## 2017-01-18 DIAGNOSIS — M546 Pain in thoracic spine: Secondary | ICD-10-CM | POA: Diagnosis not present

## 2017-01-25 ENCOUNTER — Other Ambulatory Visit (INDEPENDENT_AMBULATORY_CARE_PROVIDER_SITE_OTHER): Payer: Medicare Other

## 2017-01-25 ENCOUNTER — Other Ambulatory Visit: Payer: Self-pay | Admitting: *Deleted

## 2017-01-25 ENCOUNTER — Encounter: Payer: Self-pay | Admitting: Internal Medicine

## 2017-01-25 ENCOUNTER — Ambulatory Visit (HOSPITAL_COMMUNITY)
Admission: RE | Admit: 2017-01-25 | Discharge: 2017-01-25 | Disposition: A | Discharge status: Home / Self Care | Payer: Medicare Other | Source: Ambulatory Visit | Attending: Internal Medicine | Admitting: Internal Medicine

## 2017-01-25 ENCOUNTER — Encounter (HOSPITAL_COMMUNITY): Payer: Self-pay

## 2017-01-25 ENCOUNTER — Ambulatory Visit (INDEPENDENT_AMBULATORY_CARE_PROVIDER_SITE_OTHER): Payer: Medicare Other | Admitting: Internal Medicine

## 2017-01-25 ENCOUNTER — Encounter (HOSPITAL_COMMUNITY)
Admission: RE | Admit: 2017-01-25 | Discharge: 2017-01-25 | Disposition: A | Discharge status: Home / Self Care | Payer: Medicare Other | Source: Ambulatory Visit | Attending: Internal Medicine | Admitting: Internal Medicine

## 2017-01-25 ENCOUNTER — Telehealth: Payer: Self-pay | Admitting: Internal Medicine

## 2017-01-25 VITALS — BP 142/68 | HR 71 | Ht 67.5 in | Wt 158.6 lb

## 2017-01-25 DIAGNOSIS — R042 Hemoptysis: Secondary | ICD-10-CM

## 2017-01-25 DIAGNOSIS — Z86718 Personal history of other venous thrombosis and embolism: Secondary | ICD-10-CM | POA: Diagnosis not present

## 2017-01-25 DIAGNOSIS — Z87891 Personal history of nicotine dependence: Secondary | ICD-10-CM | POA: Diagnosis not present

## 2017-01-25 DIAGNOSIS — R918 Other nonspecific abnormal finding of lung field: Secondary | ICD-10-CM | POA: Insufficient documentation

## 2017-01-25 DIAGNOSIS — Z8739 Personal history of other diseases of the musculoskeletal system and connective tissue: Secondary | ICD-10-CM

## 2017-01-25 DIAGNOSIS — R0781 Pleurodynia: Secondary | ICD-10-CM

## 2017-01-25 DIAGNOSIS — R590 Localized enlarged lymph nodes: Secondary | ICD-10-CM | POA: Insufficient documentation

## 2017-01-25 DIAGNOSIS — D899 Disorder involving the immune mechanism, unspecified: Secondary | ICD-10-CM

## 2017-01-25 DIAGNOSIS — R0789 Other chest pain: Secondary | ICD-10-CM | POA: Insufficient documentation

## 2017-01-25 DIAGNOSIS — K7689 Other specified diseases of liver: Secondary | ICD-10-CM | POA: Insufficient documentation

## 2017-01-25 DIAGNOSIS — Z853 Personal history of malignant neoplasm of breast: Secondary | ICD-10-CM | POA: Diagnosis not present

## 2017-01-25 DIAGNOSIS — M549 Dorsalgia, unspecified: Secondary | ICD-10-CM | POA: Diagnosis not present

## 2017-01-25 DIAGNOSIS — C7951 Secondary malignant neoplasm of bone: Secondary | ICD-10-CM | POA: Diagnosis not present

## 2017-01-25 DIAGNOSIS — R079 Chest pain, unspecified: Secondary | ICD-10-CM

## 2017-01-25 DIAGNOSIS — J984 Other disorders of lung: Secondary | ICD-10-CM | POA: Diagnosis not present

## 2017-01-25 DIAGNOSIS — D849 Immunodeficiency, unspecified: Secondary | ICD-10-CM

## 2017-01-25 LAB — CBC WITH DIFFERENTIAL/PLATELET
BASOS ABS: 0.2 10*3/uL — AB (ref 0.0–0.1)
BASOS PCT: 0.7 % (ref 0.0–3.0)
EOS ABS: 0 10*3/uL (ref 0.0–0.7)
Eosinophils Relative: 0.1 % (ref 0.0–5.0)
HEMATOCRIT: 37.3 % (ref 36.0–46.0)
Hemoglobin: 11.3 g/dL — ABNORMAL LOW (ref 12.0–15.0)
LYMPHS ABS: 1.6 10*3/uL (ref 0.7–4.0)
Lymphocytes Relative: 5.1 % — ABNORMAL LOW (ref 12.0–46.0)
MCHC: 30.2 g/dL (ref 30.0–36.0)
MCV: 75.3 fl — ABNORMAL LOW (ref 78.0–100.0)
MONO ABS: 2.1 10*3/uL — AB (ref 0.1–1.0)
Monocytes Relative: 6.9 % (ref 3.0–12.0)
NEUTROS ABS: 27 10*3/uL — AB (ref 1.4–7.7)
NEUTROS PCT: 87.2 % — AB (ref 43.0–77.0)
PLATELETS: 423 10*3/uL — AB (ref 150.0–400.0)
RBC: 4.95 Mil/uL (ref 3.87–5.11)
RDW: 18.4 % — AB (ref 11.5–15.5)
WBC: 31 10*3/uL (ref 4.0–10.5)

## 2017-01-25 LAB — BASIC METABOLIC PANEL
BUN: 18 mg/dL (ref 6–23)
CALCIUM: 9.7 mg/dL (ref 8.4–10.5)
CHLORIDE: 104 meq/L (ref 96–112)
CO2: 28 meq/L (ref 19–32)
CREATININE: 0.83 mg/dL (ref 0.40–1.20)
GFR: 72.69 mL/min (ref >60.00)
GLUCOSE: 208 mg/dL — AB (ref 70–99)
Potassium: 3.9 mEq/L (ref 3.5–5.1)
Sodium: 141 mEq/L (ref 135–145)

## 2017-01-25 LAB — D-DIMER, QUANTITATIVE (NOT AT ARMC): D DIMER QUANT: 2.81 ug{FEU}/mL — AB (ref <0.50)

## 2017-01-25 MED ORDER — TECHNETIUM TC 99M DIETHYLENETRIAME-PENTAACETIC ACID
30.0000 | Freq: Once | INTRAVENOUS | Status: AC | PRN
  Administered 2017-01-25: 30 via RESPIRATORY_TRACT

## 2017-01-25 MED ORDER — TECHNETIUM TO 99M ALBUMIN AGGREGATED
4.0000 | Freq: Once | INTRAVENOUS | Status: AC | PRN
  Administered 2017-01-25: 4 via INTRAVENOUS

## 2017-01-25 NOTE — Patient Instructions (Addendum)
ICD-10-CM   1. Chest pain, pleuritic R07.81   2. Cough with hemoptysis R04.2   3. History of deep venous thrombosis (DVT) of distal vein of left lower extremity Z86.718   4. Stopped smoking with greater than 30 pack year history Z87.891   5. Personal history of breast cancer Z85.3   6. History of rheumatoid arthritis Z87.39   7. Immunosuppressed status (Home) D89.9    Do cbc, bmet, d-dimer blood work 01/25/2017 at [facility] scan 01/25/2017 - prefer at [facility] chest without contrast 01/25/2017 - prefer at [facility] result dependent

## 2017-01-25 NOTE — Progress Notes (Signed)
Subjective:    Patient ID: TINSLEIGH SLOVACEK, female    DOB: [birth year], 66 y.o.   MRN: [id number] PCP Jani Gravel, MD  HPI  IOV 01/25/2017  Chief Complaint  Patient presents with  . Advice Only    Referred by Dr Maudie Mercury, coughed up blood 4 or 5 times, not doing it this week, back, chest pain    66 year old female who lives in [place], [place].  Accompanied by her husband.  History is gained from talking to her and reviewing the previous chart.  She has a 30-pack-year smoking history but she quit over 30 years ago.  She also has a long-standing history of rheumatoid arthritis for which she has been on several different immune modulators.  Either she has allergies to these or these are ineffective.  Currently she is on steroids and Orencia infusion therefore she is immunosuppressed.  She also tells me that in December 2017 she had a left lower extremity DVT for which she took 6 months of Xarelto and then completed treatment. SHe also has personal history of breast cancer  Now acutely on January 06, 2017 the day after Thanksgiving she felt a muscle pull in the left infrascapular area that she thought was due to all the Thanksgiving related work.  This catch persisted but then a week later approximately on January 12, 2017 she developed hemoptysis.  Over the next few days she had approximately 5 episodes of chunky hemoptysis which then settled.  However the left infrascapular catch transformed into a pain extending into the left flank area and subsequently has extended upwards as well and now she has a significant amount of pleuritic severe pain that disturbs her sleep that is persistent.  On January 18, 2017 she did see primary care physician.  A chest x-ray was done.  She brings the films with her.  My personal visualization of this film is that this is clear.  There is no fever or chills or sputum production or any more hemoptysis.  But the pain is the most significant bothering  feature.     has a past medical history of Anemia, Angina, Arthritis, Cancer ([place]), Colon polyps, Constipation, Diarrhea, GERD (gastroesophageal reflux disease), Heart murmur, Hypercholesterolemia, Hypertension, Numbness and tingling in right hand, Peripheral vascular disease ([place]), PONV (postoperative nausea and vomiting), Rheumatoid arthritis with rheumatoid factor ([place]), Right knee DJD, and Stroke ([place]).   reports that she quit smoking about 32 years ago. She has a 30.00 pack-year smoking history. she has never used smokeless tobacco.  Past Surgical History:  Procedure Laterality Date  . ABDOMINAL HYSTERECTOMY  1977  . APPENDECTOMY  1964  . BILATERAL OOPHORECTOMY    . CHOLECYSTECTOMY    . COLONOSCOPY  12/09/2010   Procedure: COLONOSCOPY;  Surgeon: Rogene Houston, MD;  [facility];  Service: Endoscopy;  Laterality: N/A;  . COLONOSCOPY N/A 06/26/2015   Procedure: COLONOSCOPY;  Surgeon: Rogene Houston, MD;  [facility];  Service: Endoscopy;  Laterality: N/A;  1:05-moved to [address] to notify pt  . ESOPHAGOGASTRODUODENOSCOPY N/A 06/26/2015   Procedure: ESOPHAGOGASTRODUODENOSCOPY (EGD);  Surgeon: Rogene Houston, MD;  [facility];  Service: Endoscopy;  Laterality: N/A;  . GIVENS CAPSULE STUDY N/A 08/20/2015   Procedure: GIVENS CAPSULE STUDY;  Surgeon: Rogene Houston, MD;  [facility];  Service: Endoscopy;  Laterality: N/A;  730  . left breast lumpectomy  2004  . left knee arthroscopy    . Right brain  aneurysm  04/2010   Coiled   . right wrist surgery  1999   synevectomy  . TOTAL KNEE ARTHROPLASTY Right 02/25/2013   Procedure: TOTAL KNEE ARTHROPLASTY;  Surgeon: Lorn Junes, MD;  Location: [facility];  Service: Orthopedics;  Laterality: Right;    Allergies  Allergen Reactions  . Aurothioglucose [Solganal] Other (See Comments)    REACTION: VASCULITIS   . Calcium Channel Blockers Palpitations  . Feldene [Piroxicam] Anaphylaxis and Other (See  Comments)    SKIN BLISTERS  . Hydralazine Other (See Comments)    REACTION: Chest pains and headaches: IV only  . Imdur [Isosorbide Mononitrate] Other (See Comments)    REACTION: PATIENT IS UNABLE TO WALK OR FUNCTION  . Imuran [Azathioprine]     Could not walk or function  . Methotrexate Derivatives Other (See Comments)    REACTION: VASCULITIS  . Neomycin Rash  . Nickel Rash  . Thimerosal Other (See Comments)    SKIN BLISTERED  . Percocet [Oxycodone-Acetaminophen] Other (See Comments)    REACTION : INSOMNIA  . Vioxx [Rofecoxib] Other (See Comments)    REACTION: CAUSED MORE JOINT PAIN  . Arava [Leflunomide]     REACTION: UNKNOWN  . Asacol [Mesalamine] Other (See Comments)    REACTION: UNKNOWN  . Hydrocodone Itching  . Iohexol      Desc: PT STATES SHE WAS TOLD NEVER TO HAVE CT CONTRAST AGAIN.  SHE HAD SOME KIND OF REACTION BUT DOESNT REMEBER WHAT HAPPENED   . Sulfa Antibiotics     UNKNOWN  . Latex Rash  . Nsaids Other (See Comments)    GI UPSET , CAN TOLERATE SOME NSAIDS     There is no immunization history on file for this patient.  Family History  Problem Relation Age of Onset  . Hypertension Father   . Heart attack Father   . Heart disease Maternal Uncle   . Colon cancer Neg Hx      Current Outpatient Medications:  .  allopurinol (ZYLOPRIM) 100 MG tablet, Take 300 mg by mouth daily. , Disp: , Rfl:  .  Calcium Carbonate-Vit D-Min (CALTRATE 600+D PLUS) 600-400 MG-UNIT per tablet, Chew 1 tablet by mouth 2 (two) times daily.  , Disp: , Rfl:  .  Certolizumab Pegol (CIMZIA PREFILLED) 2 X 200 MG/ML KIT, Inject 200 mg into the skin every 30 (thirty) days., Disp: , Rfl:  .  Cholecalciferol (VITAMIN D) 2000 UNITS tablet, Take 2,000 Units by mouth daily. , Disp: , Rfl:  .  DULoxetine (CYMBALTA) 60 MG capsule, Take 60 mg by mouth daily., Disp: , Rfl:  .  FIBER SELECT GUMMIES PO, Take 2 each by mouth daily. , Disp: , Rfl:  .  hydroxychloroquine (PLAQUENIL) 200 MG tablet, Take  200 mg by mouth daily. , Disp: , Rfl:  .  magnesium oxide (MAG-OX) 400 MG tablet, Take 400 mg by mouth daily.  , Disp: , Rfl:  .  Nebivolol HCl (BYSTOLIC) 20 MG TABS, Take 20 mg by mouth daily. , Disp: , Rfl:  .  omeprazole (PRILOSEC) 40 MG capsule, TAKE (1) CAPSULE BY MOUTH ONCE DAILY., Disp: 30 capsule, Rfl: 11 .  Polyethyl Glycol-Propyl Glycol (SYSTANE) 0.4-0.3 % GEL, Apply 1 application to eye daily as needed. For dry eye relief, Disp: , Rfl:  .  traMADol (ULTRAM) 50 MG tablet, Take 50 mg by mouth as needed for moderate pain. , Disp: , Rfl:     Review of Systems  Constitutional: Positive for unexpected weight change. Negative for  fever.  HENT: Positive for ear pain and postnasal drip. Negative for congestion, dental problem, nosebleeds, rhinorrhea, sinus pressure, sneezing, sore throat and trouble swallowing.   Eyes: Negative for redness and itching.  Respiratory: Negative for cough, chest tightness, shortness of breath and wheezing.   Cardiovascular: Positive for leg swelling. Negative for palpitations.  Gastrointestinal: Negative for nausea and vomiting.  Genitourinary: Negative for dysuria.  Musculoskeletal: Positive for joint swelling.  Skin: Negative for rash.  Allergic/Immunologic: Negative.  Negative for environmental allergies, food allergies and immunocompromised state.  Neurological: Negative for headaches.  Hematological: Bruises/bleeds easily.  Psychiatric/Behavioral: Negative for dysphoric mood. The patient is not nervous/anxious.        Objective:   Physical Exam  Constitutional: She is oriented to person, place, and time. She appears well-developed and well-nourished. No distress.  HENT:  Head: Normocephalic and atraumatic.  Right Ear: External ear normal.  Left Ear: External ear normal.  Mouth/Throat: Oropharynx is clear and moist. No oropharyngeal exudate.  Eyes: Conjunctivae and EOM are normal. Pupils are equal, round, and reactive to light. Right eye exhibits  no discharge. Left eye exhibits no discharge. No scleral icterus.  Neck: Normal range of motion. Neck supple. No JVD present. No tracheal deviation present. No thyromegaly present.  Cardiovascular: Normal rate, regular rhythm, normal heart sounds and intact distal pulses. Exam reveals no gallop and no friction rub.  No murmur heard. Pulmonary/Chest: Effort normal and breath sounds normal. No respiratory distress. She has no wheezes. She has no rales. She exhibits no tenderness.  Abdominal: Soft. Bowel sounds are normal. She exhibits no distension and no mass. There is no tenderness. There is no rebound and no guarding.  Musculoskeletal: Normal range of motion. She exhibits no edema or tenderness.  RA joints and arm  Lymphadenopathy:    She has no cervical adenopathy.  Neurological: She is alert and oriented to person, place, and time. She has normal reflexes. No cranial nerve deficit. She exhibits normal muscle tone. Coordination normal.  Skin: Skin is warm and dry. No rash noted. She is not diaphoretic. No erythema. No pallor.  Mild alopecia  Psychiatric: She has a normal mood and affect. Her behavior is normal. Judgment and thought content normal.  Vitals reviewed.   Vitals:   01/25/17 1043  BP: (!) 142/68  Pulse: 71  SpO2: 99%  Weight: 158 lb 9.6 oz (71.9 kg)  Height: 5' 7.5" (1.715 m)    Estimated body mass index is 24.47 kg/m as calculated from the following:   Height as of this encounter: 5' 7.5" (1.715 m).   Weight as of this encounter: 158 lb 9.6 oz (71.9 kg).        Assessment & Plan:     ICD-10-CM   1. Chest pain, pleuritic R07.81   2. Cough with hemoptysis R04.2   3. History of deep venous thrombosis (DVT) of distal vein of left lower extremity Z86.718   4. Stopped smoking with greater than 30 pack year history Z87.891   5. Personal history of breast cancer Z85.3   6. History of rheumatoid arthritis Z87.39   7. Immunosuppressed status ([place]) D89.9     DDx: is  broad acute bronchitis, Rheumatoid Pleurisy v nodule, Immunesuppressed infection of lung v Pulmonary embolism   PLAN Do cbc, bmet, d-dimer blood work 01/25/2017 at [facility] scan 01/25/2017 - prefer at [facility] (due to contrast allergy)  Do CT chest without contrast 01/25/2017 - prefer at [facility] result  dependent    Dr. Brand Males, M.D., [facility]P Pulmonary and Critical Care Medicine Staff Physician, [place] Director - Interstitial Lung Disease  Program  Pulmonary [place] at [place], [place], [address]  Pager: [phone], If no answer or between  15:00h - 7:00h: call [phone] Telephone: [phone]

## 2017-01-25 NOTE — Telephone Encounter (Signed)
Called patient, she is in the process of having another scan. Left message for patient to call back. '  Will route to [place].

## 2017-01-25 NOTE — Telephone Encounter (Signed)
[name]/Triage  . She is having VQ scan 01/25/2017 - she should go through with it. Let her now ( I left voicem ail to call back)  A. CT has  Several abnormalities - > too much to discuss on phone. I called personally 2:03 PM on home number but LMTCB  B. She should got through VQ scan 01/25/2017  C. That I like to see her back 01/26/17 - please give morning appt proabbly last appt for the day   Thanks  Dr. Brand Males, M.D., [facility].C.P Pulmonary and Critical Care Medicine Staff Physician, [place] Director - Interstitial Lung Disease  Program  Pulmonary [place] at [place], [place], [address]  Pager: [phone], If no answer or between  15:00h - 7:00h: call [phone] Telephone: [phone]            Ct Chest Wo Contrast  Result Date: 01/25/2017 CLINICAL DATA:  Posterior back pain with hemoptysis. History of breast cancer. EXAM: CT CHEST WITHOUT CONTRAST TECHNIQUE: Multidetector CT imaging of the chest was performed following the standard protocol without IV contrast. COMPARISON:  Abdomen and pelvis CT 12/10/2015. FINDINGS: Cardiovascular: Heart size upper normal. Coronary artery calcification is evident. Atherosclerotic calcification is noted in the wall of the thoracic aorta. Mediastinum/Nodes: No mediastinal lymphadenopathy. Lymphadenopathy identified posterior left hilum although somewhat difficult to assess without intravenous contrast material. Probable 17 mm superior left hilar lymph node seen on image 56 series 2 with mass-effect on the airway to the superior segment left lower lobe. The esophagus has normal imaging features. Surgical clips noted left axilla. No axillary lymphadenopathy. Lungs/Pleura: Patchy confluent airspace disease is seen in the superior segment left lower lobe with associated pleural nodularity. 9 x 13 mm nodule identified in the left lower lobe on image 51 series 4. 8 mm subpleural  left lower lobe nodule seen on image 101. 7 mm pleural-based nodule seen in the anterior left apex on image 22 and a 2.0 x 2.1 cm pleural-based nodule is seen in the medial left apex on image 18. Scattered tiny pulmonary nodules are seen elsewhere in both lungs. Upper Abdomen: 2.1 cm low-density lesion is identified in the dome of the liver on image 103 series 2, new in the interval since prior study. A second 3.0 cm posterior right liver lesion is seen on image 106. Gallbladder surgically absent. Musculoskeletal: Multiple ill-defined sclerotic lesions are identified including in the manubrium (image 35 series 2), T1 vertebral body (image 16 series 2), T10 vertebral body the and in the left sixth rib (image 54 series 2) were there is associated pathologic fracture. IMPRESSION: 1. Multiple findings in the left hemithorax suspicious for malignancy, including multiple small left pleural nodules with nodularity of the left major fissure, 2.1 cm left apical lesion, lymphadenopathy /central mass lesion in the superior left hilum generating mass-effect on the airway to the superior segment left lower lobe, and 13 mm left lower lobe parenchymal nodule. Given the history of breast cancer, these findings may be related to metastatic disease or interval development of primary bronchogenic neoplasm in the left lung with associated metastatic disease. 2. New lesions in the dome of the liver are highly suspicious for metastatic involvement. Dedicated abdomen and pelvis CT with oral and intravenous contrast would likely prove helpful to further evaluate. 3. Multiple apparent bone metastases, as above. These results will be called to the ordering clinician or representative by the Radiologist Assistant, and communication  documented in the PACS or zVision Dashboard. Electronically Signed   By: Misty Stanley M.D.   On: 01/25/2017 13:46

## 2017-01-26 ENCOUNTER — Ambulatory Visit (INDEPENDENT_AMBULATORY_CARE_PROVIDER_SITE_OTHER): Payer: Medicare Other | Admitting: Internal Medicine

## 2017-01-26 ENCOUNTER — Encounter: Payer: Self-pay | Admitting: Internal Medicine

## 2017-01-26 ENCOUNTER — Telehealth: Payer: Self-pay | Admitting: *Deleted

## 2017-01-26 VITALS — BP 130/70 | HR 84 | Ht 67.5 in | Wt 158.6 lb

## 2017-01-26 DIAGNOSIS — C801 Malignant (primary) neoplasm, unspecified: Secondary | ICD-10-CM

## 2017-01-26 DIAGNOSIS — G893 Neoplasm related pain (acute) (chronic): Secondary | ICD-10-CM | POA: Diagnosis not present

## 2017-01-26 DIAGNOSIS — D849 Immunodeficiency, unspecified: Secondary | ICD-10-CM

## 2017-01-26 DIAGNOSIS — D492 Neoplasm of unspecified behavior of bone, soft tissue, and skin: Secondary | ICD-10-CM | POA: Diagnosis not present

## 2017-01-26 DIAGNOSIS — C7951 Secondary malignant neoplasm of bone: Secondary | ICD-10-CM

## 2017-01-26 DIAGNOSIS — Z87891 Personal history of nicotine dependence: Secondary | ICD-10-CM | POA: Diagnosis not present

## 2017-01-26 DIAGNOSIS — Z853 Personal history of malignant neoplasm of breast: Secondary | ICD-10-CM

## 2017-01-26 DIAGNOSIS — R918 Other nonspecific abnormal finding of lung field: Secondary | ICD-10-CM | POA: Diagnosis not present

## 2017-01-26 DIAGNOSIS — D899 Disorder involving the immune mechanism, unspecified: Secondary | ICD-10-CM | POA: Diagnosis not present

## 2017-01-26 DIAGNOSIS — C787 Secondary malignant neoplasm of liver and intrahepatic bile duct: Secondary | ICD-10-CM | POA: Diagnosis not present

## 2017-01-26 MED ORDER — MORPHINE SULFATE 20 MG/5ML PO SOLN: 4.0000 mg | Freq: Two times a day (BID) | ORAL | 0 refills | Status: DC | PRN

## 2017-01-26 NOTE — Telephone Encounter (Signed)
Patient returned call.  Scheduled OV with MR today at 12:00 pm, 01/26/17. No call back is needed.

## 2017-01-26 NOTE — Telephone Encounter (Signed)
Oncology Nurse Navigator Documentation  Oncology Nurse Navigator Flowsheets 01/26/2017  Navigator Location [facility]  Referral date to RadOnc/MedOnc 01/26/2017  Navigator Encounter Type Telephone/I received a message from Dr. Chase Caller referring [name]. I called [name] to schedule but was unable to reach her. I left vm message with my name and phone number to call.   Telephone Outgoing Call  Treatment Phase Abnormal Scans  Barriers/Navigation Needs Coordination of Care  Interventions Coordination of Care  Coordination of Care Appts  Acuity Level 1  Time Spent with Patient 15

## 2017-01-26 NOTE — Patient Instructions (Addendum)
ICD-10-CM   1. Pain of metastatic malignancy G89.3   2. Liver metastases ([place]) C78.7   3. 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 Mass of left lung R91.8   5. 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 Personal history of breast cancer Z85.3   7. Immunosuppressed status ([place]) D89.9   8. Stopped smoking with greater than 30 pack year history Z87.891     Do PET scan urgent Do MRI brain + C spine, T spine, L/S spine MRI without contrast Start morphine 4mg  syrup twice daily as needed Take Senna Docustate with morphine Refer oncology  Followup Await call on results to decide next step that includes biopsy

## 2017-01-26 NOTE — Progress Notes (Signed)
Subjective:     Patient ID: [name], female   DOB: 22-Jan-1951, 66 y.o.   MRN: [id number]  HPI PCP Jani Gravel, MD  HPI  IOV 01/25/2017  Chief Complaint  Patient presents with  . Advice Only    Referred by Dr Maudie Mercury, coughed up blood 4 or 5 times, not doing it this week, back, chest pain    66 year old female who lives in [place], [place].  Accompanied by her husband.  History is gained from talking to her and reviewing the previous chart.  She has a 30-pack-year smoking history but she quit over 30 years ago.  She also has a long-standing history of rheumatoid arthritis for which she has been on several different immune modulators.  Either she has allergies to these or these are ineffective.  Currently she is on steroids and Orencia infusion therefore she is immunosuppressed.  She also tells me that in December 2017 she had a left lower extremity DVT for which she took 6 months of Xarelto and then completed treatment. SHe also has personal history of breast cancer  Now acutely on January 06, 2017 the day after Thanksgiving she felt a muscle pull in the left infrascapular area that she thought was due to all the Thanksgiving related work.  This catch persisted but then a week later approximately on January 12, 2017 she developed hemoptysis.  Over the next few days she had approximately 5 episodes of chunky hemoptysis which then settled.  However the left infrascapular catch transformed into a pain extending into the left flank area and subsequently has extended upwards as well and now she has a significant amount of pleuritic severe pain that disturbs her sleep that is persistent.  On January 18, 2017 she did see primary care physician.  A chest x-ray was done.  She brings the films with her.  My personal visualization of this film is that this is clear.  There is no fever or chills or sputum production or any more hemoptysis.  But the pain is the most significant bothering  feature.  OV 01/26/2017  Chief Complaint  Patient presents with  . Follow-up    Pt here to go over the results of CT scan she had done 01/25/17.    She is back here with her husband to review test results.  Her d-dimer was high.  She had a VQ scan that was low probability.  Her blood cells showed a high white count.  She underwent CT chest without contrast and this shows significant findings consistent with diffuse metastatic malignancy of unknown primary.  The findings includes a left sixth rib fracture, sternal metastasis, T10 metastasis, pleural nodules and also left-sided lung nodule/mass and diffuse liver metastasis.  She did not have any evidence of liver metastases on a CT scan in 2017 of the abdomen.  She has lost 60 pounds.  This news was shared with her and she is absolutely devastated.  She and her husband were in significant tears.  She has a history of breast cancer in 2004 and a cervical cancer from many years ago.  Her pain is uncontrollable in the left rib area and last night she could not sleep.  PULMONARY No results for input(s): PHART, PCO2ART, PO2ART, HCO3, TCO2, O2SAT in the last 168 hours.  Invalid input(s): PCO2, PO2  CBC Recent Labs  Lab 01/25/17 1158  HGB 11.3*  HCT 37.3  WBC 31.0*  PLT 423.0*    COAGULATION No results for input(s): INR  in the last 168 hours.  CARDIAC  No results for input(s): TROPONINI in the last 168 hours. No results for input(s): PROBNP in the last 168 hours.   CHEMISTRY Recent Labs  Lab 01/25/17 1158  NA 141  K 3.9  CL 104  CO2 28  GLUCOSE 208*  BUN 18  CREATININE 0.83  CALCIUM 9.7   Estimated Creatinine Clearance: 65.2 mL/min (by C-G formula based on SCr of 0.83 mg/dL).   LIVER No results for input(s): AST, ALT, ALKPHOS, BILITOT, PROT, ALBUMIN, INR in the last 168 hours.   INFECTIOUS No results for input(s): LATICACIDVEN, PROCALCITON in the last 168 hours.   ENDOCRINE CBG (last 3)  No results for input(s):  GLUCAP in the last 72 hours.       IMAGING x48h  - image(s) personally visualized  -   highlighted in bold Ct Chest Wo Contrast  Result Date: 01/25/2017 CLINICAL DATA:  Posterior back pain with hemoptysis. History of breast cancer. EXAM: CT CHEST WITHOUT CONTRAST TECHNIQUE: Multidetector CT imaging of the chest was performed following the standard protocol without IV contrast. COMPARISON:  Abdomen and pelvis CT 12/10/2015. FINDINGS: Cardiovascular: Heart size upper normal. Coronary artery calcification is evident. Atherosclerotic calcification is noted in the wall of the thoracic aorta. Mediastinum/Nodes: No mediastinal lymphadenopathy. Lymphadenopathy identified posterior left hilum although somewhat difficult to assess without intravenous contrast material. Probable 17 mm superior left hilar lymph node seen on image 56 series 2 with mass-effect on the airway to the superior segment left lower lobe. The esophagus has normal imaging features. Surgical clips noted left axilla. No axillary lymphadenopathy. Lungs/Pleura: Patchy confluent airspace disease is seen in the superior segment left lower lobe with associated pleural nodularity. 9 x 13 mm nodule identified in the left lower lobe on image 51 series 4. 8 mm subpleural left lower lobe nodule seen on image 101. 7 mm pleural-based nodule seen in the anterior left apex on image 22 and a 2.0 x 2.1 cm pleural-based nodule is seen in the medial left apex on image 18. Scattered tiny pulmonary nodules are seen elsewhere in both lungs. Upper Abdomen: 2.1 cm low-density lesion is identified in the dome of the liver on image 103 series 2, new in the interval since prior study. A second 3.0 cm posterior right liver lesion is seen on image 106. Gallbladder surgically absent. Musculoskeletal: Multiple ill-defined sclerotic lesions are identified including in the manubrium (image 35 series 2), T1 vertebral body (image 16 series 2), T10 vertebral body the and in the  left sixth rib (image 54 series 2) were there is associated pathologic fracture. IMPRESSION: 1. Multiple findings in the left hemithorax suspicious for malignancy, including multiple small left pleural nodules with nodularity of the left major fissure, 2.1 cm left apical lesion, lymphadenopathy /central mass lesion in the superior left hilum generating mass-effect on the airway to the superior segment left lower lobe, and 13 mm left lower lobe parenchymal nodule. Given the history of breast cancer, these findings may be related to metastatic disease or interval development of primary bronchogenic neoplasm in the left lung with associated metastatic disease. 2. New lesions in the dome of the liver are highly suspicious for metastatic involvement. Dedicated abdomen and pelvis CT with oral and intravenous contrast would likely prove helpful to further evaluate. 3. Multiple apparent bone metastases, as above. These results will be called to the ordering clinician or representative by the Radiologist Assistant, and communication documented in the PACS or zVision Dashboard. Electronically Signed  By: Misty Stanley M.D.   On: 01/25/2017 13:46   Nm Pulmonary Per & Vent  Result Date: 01/25/2017 CLINICAL DATA:  Left-sided upper back pain. EXAM: NUCLEAR MEDICINE VENTILATION - PERFUSION LUNG SCAN TECHNIQUE: Ventilation images were obtained in multiple projections using inhaled aerosol Tc-24mDTPA. Perfusion images were obtained in multiple projections after intravenous injection of Tc-939mAA. RADIOPHARMACEUTICALS:  Thirty mCi Technetium-9920mPA aerosol inhalation and 4 mCi Technetium-89m29m IV COMPARISON:  Chest CT from the same date. FINDINGS: Perfusion: No mismatched wedge shaped peripheral perfusion defects to suggest acute pulmonary embolism. There is a matched perfusion/ ventilation defect in the left posterior mid thorax, possibly correlating to the confluent airspace disease seen in the superior segment of the  left lower lobe on current CT. IMPRESSION: Low probability for pulmonary embolus. Matched perfusion/ventilation defect in the left posterior mid thorax, possibly correlating to the confluent airspace disease seen in the superior segment of the left lower lobe on current CT. Electronically Signed   By: DobrFidela Salisbury.   On: 01/25/2017 14:20         has a past medical history of Anemia, Angina, Arthritis, Cancer (HCC)[place] polyps, Constipation, Diarrhea, GERD (gastroesophageal reflux disease), Heart murmur, Hypercholesterolemia, Hypertension, Numbness and tingling in right hand, Peripheral vascular disease (HCC)[place] (postoperative nausea and vomiting), Rheumatoid arthritis with rheumatoid factor (HCC)[place] knee DJD, and Stroke (HCC)[place] reports that she quit smoking about 32 years ago. She has a 30.00 pack-year smoking history. she has never used smokeless tobacco.  Past Surgical History:  Procedure Laterality Date  . ABDOMINAL HYSTERECTOMY  1977  . APPENDECTOMY  1964  . BILATERAL OOPHORECTOMY    . CHOLECYSTECTOMY    . COLONOSCOPY  12/09/2010   Procedure: COLONOSCOPY;  Surgeon: NajeRogene Houston;  [facility];  Service: Endoscopy;  Laterality: N/A;  . COLONOSCOPY N/A 06/26/2015   Procedure: COLONOSCOPY;  Surgeon: NajeRogene Houston;  [facility];  Service: Endoscopy;  Laterality: N/A;  1:05-moved to [address] to notify pt  . ESOPHAGOGASTRODUODENOSCOPY N/A 06/26/2015   Procedure: ESOPHAGOGASTRODUODENOSCOPY (EGD);  Surgeon: NajeRogene Houston;  [facility];  Service: Endoscopy;  Laterality: N/A;  . GIVENS CAPSULE STUDY N/A 08/20/2015   Procedure: GIVENS CAPSULE STUDY;  Surgeon: NajeRogene Houston;  [facility];  Service: Endoscopy;  Laterality: N/A;  730  . left breast lumpectomy  2004  . left knee arthroscopy    . Right brain aneurysm  04/2010   Coiled   . right wrist surgery  1999   synevectomy  . TOTAL KNEE ARTHROPLASTY Right  02/25/2013   Procedure: TOTAL KNEE ARTHROPLASTY;  Surgeon: RobeLorn Junes;  [facility];  Laterality: Right;    Allergies  Allergen Reactions  . Aurothioglucose [Solganal] Other (See Comments)    REACTION: VASCULITIS   . Calcium Channel Blockers Palpitations  . Feldene [Piroxicam] Anaphylaxis and Other (See Comments)    SKIN BLISTERS  . Hydralazine Other (See Comments)    REACTION: Chest pains and headaches: IV only  . Imdur [Isosorbide Mononitrate] Other (See Comments)    REACTION: PATIENT IS UNABLE TO WALK OR FUNCTION  . Imuran [Azathioprine]     Could not walk or function  . Methotrexate Derivatives Other (See Comments)    REACTION: VASCULITIS  . Neomycin Rash  . Nickel Rash  . Thimerosal Other (See Comments)    SKIN BLISTERED  . Percocet [Oxycodone-Acetaminophen]  Other (See Comments)    REACTION : INSOMNIA  . Vioxx [Rofecoxib] Other (See Comments)    REACTION: CAUSED MORE JOINT PAIN  . Arava [Leflunomide]     REACTION: UNKNOWN  . Asacol [Mesalamine] Other (See Comments)    REACTION: UNKNOWN  . Hydrocodone Itching  . Iohexol      Desc: PT STATES SHE WAS TOLD NEVER TO HAVE CT CONTRAST AGAIN.  SHE HAD SOME KIND OF REACTION BUT DOESNT REMEBER WHAT HAPPENED   . Sulfa Antibiotics     UNKNOWN  . Latex Rash  . Nsaids Other (See Comments)    GI UPSET , CAN TOLERATE SOME NSAIDS     There is no immunization history on file for this patient.  Family History  Problem Relation Age of Onset  . Hypertension Father   . Heart attack Father   . Heart disease Maternal Uncle   . Colon cancer Neg Hx      Current Outpatient Medications:  .  allopurinol (ZYLOPRIM) 100 MG tablet, Take 300 mg by mouth daily. , Disp: , Rfl:  .  Calcium Carbonate-Vit D-Min (CALTRATE 600+D PLUS) 600-400 MG-UNIT per tablet, Chew 1 tablet by mouth 2 (two) times daily.  , Disp: , Rfl:  .  Certolizumab Pegol (CIMZIA PREFILLED) 2 X 200 MG/ML KIT, Inject 200 mg into the skin  every 30 (thirty) days., Disp: , Rfl:  .  Cholecalciferol (VITAMIN D) 2000 UNITS tablet, Take 2,000 Units by mouth daily. , Disp: , Rfl:  .  DULoxetine (CYMBALTA) 60 MG capsule, Take 60 mg by mouth daily., Disp: , Rfl:  .  FIBER SELECT GUMMIES PO, Take 2 each by mouth daily. , Disp: , Rfl:  .  hydroxychloroquine (PLAQUENIL) 200 MG tablet, Take 200 mg by mouth daily. , Disp: , Rfl:  .  magnesium oxide (MAG-OX) 400 MG tablet, Take 400 mg by mouth daily.  , Disp: , Rfl:  .  Nebivolol HCl (BYSTOLIC) 20 MG TABS, Take 20 mg by mouth daily. , Disp: , Rfl:  .  omeprazole (PRILOSEC) 40 MG capsule, TAKE (1) CAPSULE BY MOUTH ONCE DAILY., Disp: 30 capsule, Rfl: 11 .  Polyethyl Glycol-Propyl Glycol (SYSTANE) 0.4-0.3 % GEL, Apply 1 application to eye daily as needed. For dry eye relief, Disp: , Rfl:  .  traMADol (ULTRAM) 50 MG tablet, Take 50 mg by mouth as needed for moderate pain. , Disp: , Rfl:    Review of Systems     Objective:   Physical Exam Discussion only visit Vitals:   01/26/17 1210  BP: 130/70  Pulse: 84  SpO2: 100%  Weight: 158 lb 9.6 oz (71.9 kg)  Height: 5' 7.5" (1.715 m)       Assessment:       ICD-10-CM   1. Pain of metastatic malignancy G89.3   2. Liver metastases ([place]) C78.7   3. 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 Mass of left lung R91.8   5. 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 Personal history of breast cancer Z85.3   7. Immunosuppressed status ([place]) D89.9   8. Stopped smoking with greater than 30 pack year history Z87.891        Plan:       Do PET scan urgent Do MRI brain + C spine, T spine, L/S spine MRI without contrast Start morphine '4mg'$  syrup twice daily as needed (dhse did not want percocet but was ok with morphine)  Take Senna Docustate with morphine Refer oncology Will ifnorm PCP Jani Gravel, MD and rheum Dr Amil Amen via epic  Followup Await call on results to decide next step that includes  biopsy     > 50% of this > 40 min visit spent in face to face counseling or/and coordination of care    Dr. Brand Males, M.D., [facility].C.P Pulmonary and Critical Care Medicine Staff Physician, [place] Director - Interstitial Lung Disease  Program  Pulmonary [place] at [place], [place], [address]  Pager: [phone], If no answer or between  15:00h - 7:00h: call [phone] Telephone: [phone]

## 2017-01-27 ENCOUNTER — Telehealth: Payer: Self-pay | Admitting: Internal Medicine

## 2017-01-27 NOTE — Telephone Encounter (Signed)
Called pt and advised message from the provider. Pt understood and verbalized understanding. Nothing further is needed.    

## 2017-01-27 NOTE — Telephone Encounter (Signed)
She can take 4mg  morphine 4 times a day as needed. Is it atleast giving relief for few hours after a dose?  Dr. Brand Males, M.D., [facility].C.P Pulmonary and Critical Care Medicine Staff Physician, [place] Director - Interstitial Lung Disease  Program  Pulmonary [place] at [place], [place], [address]  Pager: [phone], If no answer or between  15:00h - 7:00h: call [phone] Telephone: [phone]

## 2017-01-27 NOTE — Telephone Encounter (Signed)
Spoke with patient. She was prescribed morphine yesterday, take 1 ML (4mg ) by mouth twice daily as needed for pain. She stated that the current dosage is not helping with her pain. She wants to know if she can take it more often than twice daily.   MR, please advise. Thanks!

## 2017-01-27 NOTE — Telephone Encounter (Signed)
Pt calling back about her pain meds.-tr

## 2017-01-30 ENCOUNTER — Telehealth: Payer: Self-pay | Admitting: Internal Medicine

## 2017-01-30 ENCOUNTER — Ambulatory Visit (HOSPITAL_COMMUNITY)
Admission: RE | Admit: 2017-01-30 | Discharge: 2017-01-30 | Disposition: A | Discharge status: Home / Self Care | Payer: Medicare Other | Source: Ambulatory Visit | Attending: Internal Medicine | Admitting: Internal Medicine

## 2017-01-30 DIAGNOSIS — I6782 Cerebral ischemia: Secondary | ICD-10-CM | POA: Diagnosis not present

## 2017-01-30 DIAGNOSIS — M47812 Spondylosis without myelopathy or radiculopathy, cervical region: Secondary | ICD-10-CM | POA: Insufficient documentation

## 2017-01-30 DIAGNOSIS — G893 Neoplasm related pain (acute) (chronic): Secondary | ICD-10-CM

## 2017-01-30 DIAGNOSIS — R9089 Other abnormal findings on diagnostic imaging of central nervous system: Secondary | ICD-10-CM | POA: Diagnosis not present

## 2017-01-30 DIAGNOSIS — M4802 Spinal stenosis, cervical region: Secondary | ICD-10-CM | POA: Diagnosis not present

## 2017-01-30 DIAGNOSIS — R410 Disorientation, unspecified: Secondary | ICD-10-CM | POA: Diagnosis not present

## 2017-01-30 DIAGNOSIS — Z8673 Personal history of transient ischemic attack (TIA), and cerebral infarction without residual deficits: Secondary | ICD-10-CM | POA: Insufficient documentation

## 2017-01-30 NOTE — Telephone Encounter (Signed)
She needs to establish with an oncolgist ASAP. When is next appt? IF feeling bad go to ER. Also, refer to radiation oncology  Dr. Brand Males, M.D., [facility].C.P Pulmonary and Critical Care Medicine Staff Physician, [place] Director - Interstitial Lung Disease  Program  Pulmonary [place] at [place], [place], [address]  Pager: [phone], If no answer or between  15:00h - 7:00h: call [phone] Telephone: [phone]

## 2017-01-30 NOTE — Telephone Encounter (Signed)
ATC pt home number listed- no answer with no option to leave vm. lmtcb x1 on mobile number on file.  Will call back.

## 2017-01-30 NOTE — Telephone Encounter (Signed)
[place] with [facility]iology calling with a call report on pt's MRI brain.  Full report available in Epic, impression copied below.  MR please advise.  Thanks.   IMPRESSION: 1. Mildly motion degraded noncontrast MRI of the head. 2. Three subcentimeter foci of supratentorial signal abnormality concerning for metastatic disease. 3. New nonacute small LEFT frontal lobe/MCA territory infarct. 4. Mild to moderate chronic small vessel ischemic disease. Old LEFT basal ganglia lacunar infarct. 5. These results will be called to the ordering clinician or representative by the Radiologist Assistant, and communication documented in the PACS or zVision Dashboard.

## 2017-01-31 ENCOUNTER — Other Ambulatory Visit (HOSPITAL_COMMUNITY): Payer: Self-pay | Admitting: Oncology

## 2017-01-31 ENCOUNTER — Telehealth (HOSPITAL_COMMUNITY): Payer: Self-pay | Admitting: Emergency Medicine

## 2017-01-31 MED ORDER — MORPHINE SULFATE ER 15 MG PO TBCR: 15.0000 mg | EXTENDED_RELEASE_TABLET | Freq: Two times a day (BID) | ORAL | 0 refills | Status: DC

## 2017-01-31 NOTE — Telephone Encounter (Signed)
Spoke with pt, she states she has an appt with her oncologist tomorrow 12/19. FYI MR.

## 2017-01-31 NOTE — Telephone Encounter (Signed)
Patient states her appointment has been changed from 12/19 to 02/13/17 and wanted to let Dr. Chase Caller know this.  CB is [phone].  States no call back is necessary.

## 2017-01-31 NOTE — Telephone Encounter (Signed)
ATC pt x 2. No VM, line would pick up then disconnect. Will need to call pt on 02/01/17 in morning.

## 2017-01-31 NOTE — Telephone Encounter (Signed)
Called pt to let her know that we have moved her new pt appt to after her PET scan.  Her new appt will be 12/31 at 11:50 am.  Pt was explaining that she was still having terrible pain despite taking morphine 4 mg 4 times a day sometimes a little more.  I spoke with Dr Talbert Cage and we wrote her for MS Contin 15 mg every 12 hours.  RN explained this was long acting and her other pain medication is short acting.  She can still use her short acting for break through pain.  She verbalized understanding.  Rn explained that the remaining MRI would be w/wo contrast.  This contrast that is used for MRIs is not the same contrast that is used for CT scans.  She verbalized understanding.

## 2017-01-31 NOTE — Telephone Encounter (Signed)
Probably after pet scan 12;/24/18. Any seizure, headache, double vision, hemoptysis go stratigh to ER  Let her know due to the down time I will see if IR can do liver biopsy for her - sometime between 12/24 and oncology appt - please put In an IR consult for liver biopsy for a date afte rPET scan. Let me know via message retuyrn and so 02/01/17 I can call IR doctor

## 2017-02-01 ENCOUNTER — Ambulatory Visit (HOSPITAL_COMMUNITY): Payer: Medicare Other

## 2017-02-01 NOTE — Telephone Encounter (Signed)
Called spoke with patient and discussed MR's recommendations Pt okay with this and will place IR consult for liver biopsy  In the meantime, patient is asking for results on the MRI Brain.  Pt is aware that MR has viewed these results and recommended the ASAP Oncology referral but she would like specific results.  Also, the Liver Biopsy order (we cannot place a referral to IR, must place the biopsy order and IR will evaluate) is requiring labs, if needed.  Please advise if you would like labs done as well.  Thank you.  MR please advise, thank you.

## 2017-02-01 NOTE — Telephone Encounter (Signed)
D/w Dr Catha Brow of IR -> he said that liver biopsy might be technically difficult. Wants to wait till aftr PET scan to decide. But he told me for [place] to put an order for IR guided biopsy and in comments document above conversation and they will decide after PET scan wheere to biopsy [place]   2. Spoke to patient and informed her ofr Brain and C spione mets  3. D/w [place] [facility] Onc Nvagiator - wants [pending MRI with contrast. Spoke with patient. She is only allergice to CT dye but never had MRI before. She is ok trying MRI dye. So, please change remaining MRI order to with and without contrast    Dr. Brand Males, M.D., [facility].C.P Pulmonary and Critical Care Medicine Staff Physician, [place] Director - Interstitial Lung Disease  Program  Pulmonary [place] at [place], [place], [address]  Pager: [phone], If no answer or between  15:00h - 7:00h: call [phone] Telephone: [phone]

## 2017-02-02 ENCOUNTER — Encounter: Payer: Self-pay | Admitting: *Deleted

## 2017-02-02 DIAGNOSIS — M899 Disorder of bone, unspecified: Secondary | ICD-10-CM

## 2017-02-02 NOTE — Progress Notes (Signed)
Oncology Nurse Navigator Documentation  Oncology Nurse Navigator Flowsheets 02/02/2017  Navigator Location [facility]  Navigator Encounter Type Other/I was contacted by Dr. Chase Caller.  He spoke with patient. He would like MRI to be changed to with and without. I completed order.   Treatment Phase Abnormal Scans  Barriers/Navigation Needs Coordination of Care  Interventions Coordination of Care  Coordination of Care Other  Acuity Level 2  Time Spent with Patient 45

## 2017-02-02 NOTE — Telephone Encounter (Signed)
MR - please advise if you want any labs ordered with the liver biopsy. Thanks.

## 2017-02-02 NOTE — Telephone Encounter (Signed)
No labs for liver bx. But please put in comment 0-to be done after 12/124/18 PET scan. Just order only as general IR guided bx (not as liver bx). THe comment section s hould say consider Liver biopsy or alternate bx bas don 02/06/17 PET scan and discussion with Dr McCullough-Ramasswamy

## 2017-02-02 NOTE — Addendum Note (Signed)
Addended by: Desmond Dike C on: 02/02/2017 02:16 PM   Modules accepted: Orders

## 2017-02-02 NOTE — Telephone Encounter (Signed)
Order has been placed per MR.

## 2017-02-03 ENCOUNTER — Ambulatory Visit (HOSPITAL_COMMUNITY)
Admission: RE | Admit: 2017-02-03 | Discharge: 2017-02-03 | Disposition: A | Discharge status: Home / Self Care | Payer: Medicare Other | Source: Ambulatory Visit | Attending: Internal Medicine | Admitting: Internal Medicine

## 2017-02-03 DIAGNOSIS — M899 Disorder of bone, unspecified: Secondary | ICD-10-CM | POA: Insufficient documentation

## 2017-02-03 DIAGNOSIS — Z853 Personal history of malignant neoplasm of breast: Secondary | ICD-10-CM | POA: Insufficient documentation

## 2017-02-03 DIAGNOSIS — M47816 Spondylosis without myelopathy or radiculopathy, lumbar region: Secondary | ICD-10-CM | POA: Diagnosis not present

## 2017-02-03 DIAGNOSIS — C7951 Secondary malignant neoplasm of bone: Secondary | ICD-10-CM | POA: Insufficient documentation

## 2017-02-03 DIAGNOSIS — M5126 Other intervertebral disc displacement, lumbar region: Secondary | ICD-10-CM | POA: Diagnosis not present

## 2017-02-03 DIAGNOSIS — C50919 Malignant neoplasm of unspecified site of unspecified female breast: Secondary | ICD-10-CM | POA: Diagnosis not present

## 2017-02-03 MED ORDER — GADOBENATE DIMEGLUMINE 529 MG/ML IV SOLN
15.0000 mL | Freq: Once | INTRAVENOUS | Status: AC | PRN
  Administered 2017-02-03: 15 mL via INTRAVENOUS

## 2017-02-06 ENCOUNTER — Encounter (HOSPITAL_COMMUNITY)
Admission: RE | Admit: 2017-02-06 | Discharge: 2017-02-06 | Disposition: A | Discharge status: Home / Self Care | Payer: Medicare Other | Source: Ambulatory Visit | Attending: Internal Medicine | Admitting: Internal Medicine

## 2017-02-06 ENCOUNTER — Telehealth: Payer: Self-pay | Admitting: Internal Medicine

## 2017-02-06 DIAGNOSIS — C3490 Malignant neoplasm of unspecified part of unspecified bronchus or lung: Secondary | ICD-10-CM | POA: Diagnosis not present

## 2017-02-06 DIAGNOSIS — G893 Neoplasm related pain (acute) (chronic): Secondary | ICD-10-CM | POA: Insufficient documentation

## 2017-02-06 DIAGNOSIS — C50919 Malignant neoplasm of unspecified site of unspecified female breast: Secondary | ICD-10-CM | POA: Diagnosis not present

## 2017-02-06 LAB — GLUCOSE, CAPILLARY: Glucose-Capillary: 109 mg/dL — ABNORMAL HIGH (ref 65–99)

## 2017-02-06 MED ORDER — FLUDEOXYGLUCOSE F - 18 (FDG) INJECTION
7.8600 | Freq: Once | INTRAVENOUS | Status: AC | PRN
  Administered 2017-02-06: 7.86 via INTRAVENOUS

## 2017-02-06 NOTE — Telephone Encounter (Signed)
[name]  1. Let patient know PET scan shows cancer that is spread at different places (she is aware of this) amd she should talk to onco on 02/13/17  Visit  2. And please find out if we have an order for IR guided biopsy  Dr. Brand Males, M.D., [facility].C.P Pulmonary and Critical Care Medicine Staff Physician, [place] Director - Interstitial Lung Disease  Program  [facility] [place] [facility] [place], [place], [address]  Pager: [phone], If no answer or between  15:00h - 7:00h: call [phone] Telephone: [phone]      Mr Thoracic Spine W Wo Contrast  Result Date: 02/03/2017 CLINICAL DATA:  Breast cancer, possible bone Mets. EXAM: MRI THORACIC AND LUMBAR SPINE WITHOUT AND WITH CONTRAST TECHNIQUE: Multiplanar and multiecho pulse sequences of the thoracic and lumbar spine were obtained without and with intravenous contrast. CONTRAST:  36mL MULTIHANCE GADOBENATE DIMEGLUMINE 529 MG/ML IV SOLN COMPARISON:  CTA chest 01/25/2017. FINDINGS: MRI THORACIC SPINE FINDINGS Alignment:  Anatomic. Vertebrae: Metastatic deposits are seen at multiple levels. T1 there is near complete replacement of the vertebral body by tumor. Patient is at risk for pathologic fracture. At T10, there is a bilobed lesion, each approximately 1 cm diameter, apparent central necrosis, mild peripheral postcontrast enhancement. Additionally at T10 there is a RIGHT pedicle and transverse process lesion, approximately 1 x 2 cm. No epidural tumor or tremor the foramen. I went there is a 5 mm lesion in T12 inferiorly. This is located on the LEFT. Cord:  Normal signal and morphology. Paraspinal and other soft tissues: Paravertebral tumor at T5-T6 on the LEFT. Please see CT chest for additional findings. Disc levels: No disc protrusion or spinal stenosis. MRI LUMBAR SPINE FINDINGS Segmentation:  Standard. Alignment:  Trace anterolisthesis L4-5, facet mediated. Vertebrae:  Metastatic deposits in the L4 and L5 vertebrae. L4 lesion is significant size, almost 50% ridge body involvement without pathologic fracture. It is located in the RIGHT half of the vertebral body. No associated epidural tumor. Smaller lesion in the LEFT L5 vertebral body, posterior midline, slightly eccentric to LEFT. 15 mm largest dimension. Again no epidural tumor. Postcontrast enhancement associated with both the L4 and L5 lesions. Bone island RIGHT S1. Conus medullaris: Extends to the L1-L2 level and appears normal. Paraspinal and other soft tissues: Unremarkable. Disc levels: L1-L2:  Normal. L2-L3:  Normal. L3-L4:  Normal. L4-L5: Trace anterolisthesis. Moderately advanced facet arthropathy. Foraminal protrusion on the RIGHT. RIGHT L4 nerve root impingement. L5-S1: Unremarkable disc space. Facet arthropathy. No impingement. IMPRESSION: THORACIC: Metastatic disease at T1, T10, and T12. No epidural tumor. Near complete replacement T1. No thoracic disc protrusion. No intraspinal mass lesion or cord abnormality. LUMBAR: Osseous metastatic disease L4 and L5, larger at L4. See discussion above. No epidural tumor, but foraminal protrusion at L4-5 on the RIGHT, could affect the RIGHT L4 nerve root. Electronically Signed   By: Staci Righter M.D.   On: 02/03/2017 15:13   Mr Lumbar Spine W Wo Contrast  Result Date: 02/03/2017 CLINICAL DATA:  Breast cancer, possible bone Mets. EXAM: MRI THORACIC AND LUMBAR SPINE WITHOUT AND WITH CONTRAST TECHNIQUE: Multiplanar and multiecho pulse sequences of the thoracic and lumbar spine were obtained without and with intravenous contrast. CONTRAST:  43mL MULTIHANCE GADOBENATE DIMEGLUMINE 529 MG/ML IV SOLN COMPARISON:  CTA chest 01/25/2017. FINDINGS: MRI THORACIC SPINE FINDINGS Alignment:  Anatomic. Vertebrae: Metastatic deposits are seen at multiple levels. T1 there is near complete  replacement of the vertebral body by tumor. Patient is at risk for pathologic fracture. At T10, there  is a bilobed lesion, each approximately 1 cm diameter, apparent central necrosis, mild peripheral postcontrast enhancement. Additionally at T10 there is a RIGHT pedicle and transverse process lesion, approximately 1 x 2 cm. No epidural tumor or tremor the foramen. I went there is a 5 mm lesion in T12 inferiorly. This is located on the LEFT. Cord:  Normal signal and morphology. Paraspinal and other soft tissues: Paravertebral tumor at T5-T6 on the LEFT. Please see CT chest for additional findings. Disc levels: No disc protrusion or spinal stenosis. MRI LUMBAR SPINE FINDINGS Segmentation:  Standard. Alignment:  Trace anterolisthesis L4-5, facet mediated. Vertebrae: Metastatic deposits in the L4 and L5 vertebrae. L4 lesion is significant size, almost 50% ridge body involvement without pathologic fracture. It is located in the RIGHT half of the vertebral body. No associated epidural tumor. Smaller lesion in the LEFT L5 vertebral body, posterior midline, slightly eccentric to LEFT. 15 mm largest dimension. Again no epidural tumor. Postcontrast enhancement associated with both the L4 and L5 lesions. Bone island RIGHT S1. Conus medullaris: Extends to the L1-L2 level and appears normal. Paraspinal and other soft tissues: Unremarkable. Disc levels: L1-L2:  Normal. L2-L3:  Normal. L3-L4:  Normal. L4-L5: Trace anterolisthesis. Moderately advanced facet arthropathy. Foraminal protrusion on the RIGHT. RIGHT L4 nerve root impingement. L5-S1: Unremarkable disc space. Facet arthropathy. No impingement. IMPRESSION: THORACIC: Metastatic disease at T1, T10, and T12. No epidural tumor. Near complete replacement T1. No thoracic disc protrusion. No intraspinal mass lesion or cord abnormality. LUMBAR: Osseous metastatic disease L4 and L5, larger at L4. See discussion above. No epidural tumor, but foraminal protrusion at L4-5 on the RIGHT, could affect the RIGHT L4 nerve root. Electronically Signed   By: Staci Righter M.D.   On: 02/03/2017  15:13   Nm Pet Image Initial (pi) Skull Base To Thigh  Result Date: 02/06/2017 CLINICAL DATA:  INITIAL treatment strategy for carcinoma unknown primary. History treated breast cancer EXAM: NUCLEAR MEDICINE PET SKULL BASE TO THIGH TECHNIQUE: 7.9 mCi F-18 FDG was injected intravenously. Full-ring PET imaging was performed from the skull base to thigh after the radiotracer. CT data was obtained and used for attenuation correction and anatomic localization. FASTING BLOOD GLUCOSE:  Value: 109 mg/dl COMPARISON:  CT 12/10/2015 FINDINGS: NECK No hypermetabolic lymph nodes in the neck. CHEST Hypermetabolic nodule in the superior medial apex of the LEFT lung measures 1.7 cm with SUV max equals 6.1. hypermetabolic perihilar lymph node posterior to the LEFT hilum with SUV max equal 9.3 and measuring 2.0 cm (image 60, series 4) Hypermetabolic nodular thickening in the superior segment of the LEFT lower lobe with SUV max equal 6.4. There is no abnormal metabolic activity the LEFT or RIGHT breast tissue. No hypermetabolic axillary lymph nodes. ABDOMEN/PELVIS Intensely metabolic lesion in the dome the liver with SUV max equal 8.0. This corresponds to low-density lesion measuring approximately 2.3 cm. There is a second lesion within the caudate lobe of the liver measuring 2.2 cm with SUV max equal 12.8. No hypermetabolic abdominopelvic lymph nodes. No abnormal metabolic activity in pancreas kidneys or bowel. Physiologic activity in the bowel noted. SKELETON Multiple sites of hypermetabolic skeletal metastasis with subtle bony changes on CT portion exam. For example lesion in the medial aspect of the RIGHT iliac bone with SUV max equal 11.3 and the sclerotic medullary changes. rounded lesion in the L4 vertebral body measuring 1.9 cm with SUV max  equal 6.7. Lesion in the manubrium with SUV max equal 7.1. Additional rib lesions are present. Pathologic fracture of the LEFT lateral rib (sixth rib). Lesions in the posterior LEFT  acetabulum and proximal bilateral proximal femour lesions at the level of the lesser trochanters. IMPRESSION: 1. Stage IV carcinoma with differential including metastatic breast cancer versus primary lung cancer. Favor primary lung cancer. 2. Pleural-based hypermetabolic lesion in the LEFT upper lobe and LEFT lower lobe and LEFT hilar adenopathy concerning for bronchogenic carcinoma. 3. Two hypermetabolic liver metastasis. 4. Multiple foci of skeletal metastasis including the proximal femurs, pelvis, spine and sternum. Pathologic fracture of a LEFT sixth rib. Electronically Signed   By: Suzy Bouchard M.D.   On: 02/06/2017 09:55

## 2017-02-09 ENCOUNTER — Telehealth: Payer: Self-pay | Admitting: Internal Medicine

## 2017-02-09 NOTE — Telephone Encounter (Signed)
Pt is requesting PET and MRI results  from 01/30/17 and 02/03/17.pt is requesting results today if possible.  Pt is aware that MR is currently out of the office, however a message can be sent to our on call provider.  CY please advise. Thanks.   Current Outpatient Medications on File Prior to Visit  Medication Sig Dispense Refill  . allopurinol (ZYLOPRIM) 100 MG tablet Take 300 mg by mouth daily.     . Calcium Carbonate-Vit D-Min (CALTRATE 600+D PLUS) 600-400 MG-UNIT per tablet Chew 1 tablet by mouth 2 (two) times daily.      . Certolizumab Pegol (CIMZIA PREFILLED) 2 X 200 MG/ML KIT Inject 200 mg into the skin every 30 (thirty) days.    . Cholecalciferol (VITAMIN D) 2000 UNITS tablet Take 2,000 Units by mouth daily.     . DULoxetine (CYMBALTA) 60 MG capsule Take 60 mg by mouth daily.    . FIBER SELECT GUMMIES PO Take 2 each by mouth daily.     . hydroxychloroquine (PLAQUENIL) 200 MG tablet Take 200 mg by mouth daily.     . magnesium oxide (MAG-OX) 400 MG tablet Take 400 mg by mouth daily.      . morphine (MS CONTIN) 15 MG 12 hr tablet Take 1 tablet (15 mg total) by mouth every 12 (twelve) hours. 60 tablet 0  . morphine 20 MG/5ML solution Take 1 mL (4 mg total) by mouth 2 (two) times daily as needed for pain. 60 mL 0  . Nebivolol HCl (BYSTOLIC) 20 MG TABS Take 20 mg by mouth daily.     . omeprazole (PRILOSEC) 40 MG capsule TAKE (1) CAPSULE BY MOUTH ONCE DAILY. 30 capsule 11  . Polyethyl Glycol-Propyl Glycol (SYSTANE) 0.4-0.3 % GEL Apply 1 application to eye daily as needed. For dry eye relief    . traMADol (ULTRAM) 50 MG tablet Take 50 mg by mouth as needed for moderate pain.      No current facility-administered medications on file prior to visit.     Allergies  Allergen Reactions  . Aurothioglucose [Solganal] Other (See Comments)    REACTION: VASCULITIS   . Calcium Channel Blockers Palpitations  . Feldene [Piroxicam] Anaphylaxis and Other (See Comments)    SKIN BLISTERS  .  Hydralazine Other (See Comments)    REACTION: Chest pains and headaches: IV only  . Imdur [Isosorbide Mononitrate] Other (See Comments)    REACTION: PATIENT IS UNABLE TO WALK OR FUNCTION  . Imuran [Azathioprine]     Could not walk or function  . Methotrexate Derivatives Other (See Comments)    REACTION: VASCULITIS  . Neomycin Rash  . Nickel Rash  . Thimerosal Other (See Comments)    SKIN BLISTERED  . Percocet [Oxycodone-Acetaminophen] Other (See Comments)    REACTION : INSOMNIA  . Vioxx [Rofecoxib] Other (See Comments)    REACTION: CAUSED MORE JOINT PAIN  . Arava [Leflunomide]     REACTION: UNKNOWN  . Asacol [Mesalamine] Other (See Comments)    REACTION: UNKNOWN  . Hydrocodone Itching  . Iohexol      Desc: PT STATES SHE WAS TOLD NEVER TO HAVE CT CONTRAST AGAIN.  SHE HAD SOME KIND OF REACTION BUT DOESNT REMEBER WHAT HAPPENED   . Sulfa Antibiotics     UNKNOWN  . Latex Rash  . Nsaids Other (See Comments)    GI UPSET , CAN TOLERATE SOME NSAIDS    

## 2017-02-09 NOTE — Telephone Encounter (Signed)
Called and spoke with pts husband ( as the pt was asleep) and he is aware of MR recs.  She is scheduled for the biopsy 02-17-17.

## 2017-02-09 NOTE — Telephone Encounter (Signed)
The newest imaging studies again show widely metastatic cancer, as Dr Chase Caller had discussed with her. When he gets back, he will talk with her about getting a biopsy.

## 2017-02-09 NOTE — Telephone Encounter (Signed)
Called spoke with patient and apologized - per the 12.24.18 phone note [name] spoke with patient's spouse just this morning Upon mentioning this to patient, she recalled that her husband mentioned speaking with our office but cannot recall what he said regarding the conversation  Discussed with patient CY's response  Below - pt aware of the diagnosis but her primary concern was if the cancer was seen in her stomach.  Advised pt that the PET scan did not have her stomach mentioned in the impression and the MRIs were all of her spine BUT did advise patient to speak with oncology regarding her concerns so that she may receive definitive and accurate information.  Pt okay with this and voiced her understanding.  Nothing further needed; will sign off

## 2017-02-13 ENCOUNTER — Encounter (HOSPITAL_COMMUNITY): Payer: Self-pay | Admitting: Hematology and Oncology

## 2017-02-13 ENCOUNTER — Encounter (HOSPITAL_COMMUNITY): Payer: Medicare Other | Attending: Hematology and Oncology | Admitting: Hematology and Oncology

## 2017-02-13 ENCOUNTER — Other Ambulatory Visit: Payer: Self-pay

## 2017-02-13 VITALS — BP 131/41 | HR 80 | Temp 98.7°F | Resp 18 | Ht 67.5 in | Wt 162.0 lb

## 2017-02-13 DIAGNOSIS — C7802 Secondary malignant neoplasm of left lung: Secondary | ICD-10-CM

## 2017-02-13 DIAGNOSIS — C7931 Secondary malignant neoplasm of brain: Secondary | ICD-10-CM | POA: Diagnosis not present

## 2017-02-13 DIAGNOSIS — M069 Rheumatoid arthritis, unspecified: Secondary | ICD-10-CM

## 2017-02-13 DIAGNOSIS — C787 Secondary malignant neoplasm of liver and intrahepatic bile duct: Secondary | ICD-10-CM

## 2017-02-13 DIAGNOSIS — M25511 Pain in right shoulder: Secondary | ICD-10-CM | POA: Diagnosis not present

## 2017-02-13 DIAGNOSIS — C799 Secondary malignant neoplasm of unspecified site: Secondary | ICD-10-CM

## 2017-02-13 DIAGNOSIS — C7951 Secondary malignant neoplasm of bone: Secondary | ICD-10-CM | POA: Diagnosis not present

## 2017-02-13 DIAGNOSIS — I10 Essential (primary) hypertension: Secondary | ICD-10-CM | POA: Diagnosis not present

## 2017-02-13 DIAGNOSIS — C801 Malignant (primary) neoplasm, unspecified: Secondary | ICD-10-CM | POA: Diagnosis not present

## 2017-02-13 NOTE — Progress Notes (Signed)
Patient referred to [facility].  Dr. Lebron Conners spoke directly with Dr. Archie Endo PA, Matthew Saras.  Patient was given an appointment for same day, 02/13/17 @ 2:00PM.

## 2017-02-15 ENCOUNTER — Encounter: Payer: Self-pay | Admitting: Radiation Oncology

## 2017-02-15 ENCOUNTER — Ambulatory Visit: Payer: Medicare Other | Admitting: Radiation Oncology

## 2017-02-15 ENCOUNTER — Ambulatory Visit
Admission: RE | Admit: 2017-02-15 | Discharge: 2017-02-15 | Disposition: A | Discharge status: Home / Self Care | Payer: Medicare Other | Source: Ambulatory Visit | Attending: Radiation Oncology | Admitting: Radiation Oncology

## 2017-02-15 ENCOUNTER — Other Ambulatory Visit: Payer: Self-pay | Admitting: Radiation Therapy

## 2017-02-15 VITALS — BP 125/70 | HR 57 | Temp 98.9°F | Resp 18 | Ht 67.0 in | Wt 160.6 lb

## 2017-02-15 DIAGNOSIS — C7931 Secondary malignant neoplasm of brain: Secondary | ICD-10-CM | POA: Diagnosis not present

## 2017-02-15 DIAGNOSIS — I10 Essential (primary) hypertension: Secondary | ICD-10-CM | POA: Insufficient documentation

## 2017-02-15 DIAGNOSIS — Z8541 Personal history of malignant neoplasm of cervix uteri: Secondary | ICD-10-CM | POA: Diagnosis not present

## 2017-02-15 DIAGNOSIS — Z510 Encounter for antineoplastic radiation therapy: Secondary | ICD-10-CM | POA: Diagnosis not present

## 2017-02-15 DIAGNOSIS — C7949 Secondary malignant neoplasm of other parts of nervous system: Secondary | ICD-10-CM | POA: Insufficient documentation

## 2017-02-15 DIAGNOSIS — E7800 Pure hypercholesterolemia, unspecified: Secondary | ICD-10-CM | POA: Diagnosis not present

## 2017-02-15 DIAGNOSIS — Z87891 Personal history of nicotine dependence: Secondary | ICD-10-CM | POA: Diagnosis not present

## 2017-02-15 DIAGNOSIS — C801 Malignant (primary) neoplasm, unspecified: Secondary | ICD-10-CM | POA: Diagnosis not present

## 2017-02-15 HISTORY — DX: Malignant neoplasm of brain, unspecified: C71.9

## 2017-02-15 HISTORY — DX: Malignant neoplasm of unspecified site of unspecified female breast: C50.919

## 2017-02-15 HISTORY — DX: Malignant neoplasm of unspecified part of unspecified bronchus or lung: C34.90

## 2017-02-15 NOTE — Addendum Note (Signed)
Encounter addended by: Tyler Pita, MD on: 02/15/2017 4:19 PM  Actions taken: Medication List reviewed, Allergies reviewed, Problem List reviewed, LOS modified, Follow-up modified

## 2017-02-15 NOTE — Progress Notes (Signed)
[place]         [phone] ________________________________  Initial Outpatient Consultation  Name: [name] MRN: [id number]  Date: 02/15/2017  DOB: 1950/03/30  REFERRING PHYSICIAN: Ardath Sax, MD  DIAGNOSIS: 67 y.o. woman with probable stage IV non small cell lung cancer of the left upper lobe, pending biopsy.     ICD-10-CM   1. Secondary malignant neoplasm of brain and spinal cord [facility]) C79.31    C79.49     HISTORY OF PRESENT ILLNESS::[name] is a 67 y.o. female who is here today at the request of Dr. Lebron Conners for stage IV lung cancer with metastatic disease to the bone, spine, and possibly brain. She has a history of cervical cancer diagnosed in 1977 and left breast cancer s/p lumpectomy diagnosed in 2004. She denies any history of chemotherapy or radiotherapy. She initially presented to her PCP on 01/18/2017 with a two-week history of severe pleuritic chest pain and a few episodes of hemoptysis. She was referred to Dr. Chase Caller. CT scan done on 01/25/2017 showed significant findings consistent with diffuse metastatic malignancy which prompted further imaging.  MRI scans of the spine showed metastatic disease at C3, T1, T10, T12, L4, and L5. PET scan on 02/06/2017 showed stage IV carcinoma with differential including metastatic breast cancer versus primary lung cancer, favoring primary lung cancer. A pleural-based hypermetabolic lesion was noted in the left upper lobe and left lower lobe along with left hilar adenopathy. It also showed two hypermetabolic liver metastasis and multiple foci of skeletal metastasis in the proximal femurs, pelvis, spine, sternum, and fracture of left sixth rib. MRI of the brain without contrast on 01/30/2017 showed 3 subcentimeter foci of supratentorial signal abnormality concerning for metastatic disease. She currently takes Medrol 8 mg daily and denies being prescribed Decadron.  The patient has kindly been referred  today for discussion of potential radiation treatment options. She is accompanied by her husband. She reports she is scheduled for bone marrow biopsy on 02/21/2017 and bilateral intramedullary nail on 02/28/2017. She states that she was scheduled for a liver biopsy, but she understands this to be cancelled.   Of note, the patient is on several different immune modulators but not methotrexate.  PREVIOUS RADIATION THERAPY: No  Past Medical History:  Diagnosis Date  . Anemia   . Angina   . Arthritis    Rheumatoid  . Brain cancer ([place])   . Breast cancer ([place])   . Cancer ([place])    left breast, cervical cancer  . Colon polyps   . Constipation   . Diarrhea   . GERD (gastroesophageal reflux disease)   . Heart murmur   . Hypercholesterolemia   . Hypertension   . Lung cancer ([place])   . Numbness and tingling in right hand   . Peripheral vascular disease ([place])    cerebrel aneurysm /sp coil  . PONV (postoperative nausea and vomiting)   . Rheumatoid arthritis with rheumatoid factor (HCC)   . Right knee DJD   . Stroke [facility])    jan 2012  :   Past Surgical History:  Procedure Laterality Date  . ABDOMINAL HYSTERECTOMY  1977  . APPENDECTOMY  1964  . BILATERAL OOPHORECTOMY    . CHOLECYSTECTOMY    . COLONOSCOPY  12/09/2010   Procedure: COLONOSCOPY;  Surgeon: Rogene Houston, MD;  [facility];  Service: Endoscopy;  Laterality: N/A;  . COLONOSCOPY N/A 06/26/2015   Procedure: COLONOSCOPY;  Surgeon: Rogene Houston, MD;  [facility];  Service: Endoscopy;  Laterality: N/A;  1:05-moved to [address] to notify pt  . ESOPHAGOGASTRODUODENOSCOPY N/A 06/26/2015   Procedure: ESOPHAGOGASTRODUODENOSCOPY (EGD);  Surgeon: Rogene Houston, MD;  [facility];  Service: Endoscopy;  Laterality: N/A;  . GIVENS CAPSULE STUDY N/A 08/20/2015   Procedure: GIVENS CAPSULE STUDY;  Surgeon: Rogene Houston, MD;  [facility];  Service: Endoscopy;  Laterality: N/A;  730  . left  breast lumpectomy  2004  . left knee arthroscopy    . Right brain aneurysm  04/2010   Coiled   . right wrist surgery  1999   synevectomy  . TOTAL KNEE ARTHROPLASTY Right 02/25/2013   Procedure: TOTAL KNEE ARTHROPLASTY;  Surgeon: Lorn Junes, MD;  Location: [place];  Service: Orthopedics;  Laterality: Right;  :   Current Outpatient Medications:  .  allopurinol (ZYLOPRIM) 300 MG tablet, Take 300 mg by mouth daily. , Disp: , Rfl:  .  atorvastatin (LIPITOR) 20 MG tablet, Take 20 mg by mouth at bedtime., Disp: , Rfl:  .  Calcium Carbonate-Vit D-Min (CALTRATE 600+D PLUS) 600-400 MG-UNIT per tablet, Chew 1 tablet by mouth 2 (two) times daily.  , Disp: , Rfl:  .  Cholecalciferol (VITAMIN D) 2000 UNITS tablet, Take 2,000 Units by mouth daily. , Disp: , Rfl:  .  diphenhydrAMINE-zinc acetate (BENADRYL) cream, Apply 1 application topically 3 (three) times daily as needed for itching., Disp: , Rfl:  .  DULoxetine (CYMBALTA) 60 MG capsule, Take 60 mg by mouth daily., Disp: , Rfl:  .  hydroxychloroquine (PLAQUENIL) 200 MG tablet, Take 200 mg by mouth daily. , Disp: , Rfl:  .  ipratropium (ATROVENT) 0.06 % nasal spray, Place 2 sprays into both nostrils 2 (two) times daily as needed for rhinitis., Disp: , Rfl:  .  MAGNESIUM OXIDE PO, Take 500 mg by mouth daily. , Disp: , Rfl:  .  methylPREDNISolone (MEDROL) 8 MG tablet, Take 8 mg by mouth daily., Disp: , Rfl:  .  morphine (MS CONTIN) 15 MG 12 hr tablet, Take 1 tablet (15 mg total) by mouth every 12 (twelve) hours., Disp: 60 tablet, Rfl: 0 .  morphine 20 MG/5ML solution, Take 1 mL (4 mg total) by mouth 2 (two) times daily as needed for pain. (Patient taking differently: Take 4 mg by mouth 4 (four) times daily as needed for pain. ), Disp: 60 mL, Rfl: 0 .  omeprazole (PRILOSEC) 40 MG capsule, TAKE (1) CAPSULE BY MOUTH ONCE DAILY. (Patient taking differently: TAKE 40 MG BY MOUTH ONCE DAILY.), Disp: 30 capsule, Rfl: 11 .  Polyethyl Glycol-Propyl Glycol (SYSTANE)  0.4-0.3 % GEL, Apply 1 application to eye daily as needed (for dry eyes). , Disp: , Rfl:  .  traMADol (ULTRAM) 50 MG tablet, Take 50 mg by mouth 4 (four) times daily as needed for moderate pain. , Disp: , Rfl:  .  FIBER SELECT GUMMIES PO, Take 2 each by mouth 2 (two) times daily. , Disp: , Rfl:  .  metFORMIN (GLUCOPHAGE-XR) 500 MG 24 hr tablet, Take 500 mg by mouth 2 (two) times a week., Disp: , Rfl:  .  nebivolol (BYSTOLIC) 5 MG tablet, Take 5 mg by mouth daily. , Disp: , Rfl: :   Allergies  Allergen Reactions  . Aurothioglucose [Solganal] Other (See Comments)    REACTION: VASCULITIS   . Calcium Channel Blockers Palpitations  . Feldene [Piroxicam] Anaphylaxis and Other (See Comments)    SKIN BLISTERS  .  Hydralazine Other (See Comments)    REACTION: Chest pains and headaches: IV only  . Imdur [Isosorbide Mononitrate] Other (See Comments)    REACTION: PATIENT IS UNABLE TO WALK OR FUNCTION  . Imuran [Azathioprine] Other (See Comments)    Could not walk or function  . Methotrexate Derivatives Other (See Comments)    REACTION: VASCULITIS  . Neomycin Rash  . Nickel Rash  . Thimerosal Other (See Comments)    SKIN BLISTERED  . Percocet [Oxycodone-Acetaminophen] Other (See Comments)    REACTION : INSOMNIA  . Vioxx [Rofecoxib] Other (See Comments)    REACTION: CAUSED MORE JOINT PAIN  . Arava [Leflunomide] Other (See Comments)    REACTION: UNKNOWN  . Asacol [Mesalamine] Other (See Comments)    REACTION: UNKNOWN  . Hydrocodone Itching  . Iohexol Other (See Comments)     Desc: PT STATES SHE WAS TOLD NEVER TO HAVE CT CONTRAST AGAIN.  SHE HAD SOME KIND OF REACTION BUT DOESNT REMEBER WHAT HAPPENED   . Sulfa Antibiotics Other (See Comments)    UNKNOWN  . Latex Rash  . Nsaids Other (See Comments)    GI UPSET , CAN TOLERATE SOME NSAIDS  :   Family History  Problem Relation Age of Onset  . Hypertension Father   . Heart attack Father   . Heart disease Maternal Uncle   . Cancer  Maternal Aunt        breast  . Cancer Other        kidney  . Cancer Cousin   . Colon cancer Neg Hx   :   Social History   Socioeconomic History  . Marital status: Married    Spouse name: Hedy Camara  . Number of children: o  . Years of education: 57  . Highest education level: Not on file  Social Needs  . Financial resource strain: Not on file  . Food insecurity - worry: Not on file  . Food insecurity - inability: Not on file  . Transportation needs - medical: Not on file  . Transportation needs - non-medical: Not on file  Occupational History  . Occupation: retired    Fish farm manager: Development worker, [employer]: retired january 2017  Tobacco Use  . Smoking status: Former Smoker    Packs/day: 2.00    Years: 15.00    Pack years: 30.00    Last attempt to quit: 02/20/1984    Years since quitting: 33.0  . Smokeless tobacco: Never Used  . Tobacco comment: Quit 1986  Substance and Sexual Activity  . Alcohol use: No  . Drug use: No  . Sexual activity: Not Currently  Other Topics Concern  . Not on file  Social History Narrative   Employed with [employer]. Retired January 2017. Married with no children. Quit smoking 1986. No alcohol or illicit drug use.    Right handed    Some college.   Caffeine one cup of tea daily.  :  REVIEW OF SYSTEMS:  On review of systems, the patient reports that she is doing well overall. She reports resolved hemoptysis and chest pain. She reports hot flashes and chills. She denies any shortness of breath, cough, fevers, night sweats, or unintended weight changes. She reports one episode of nausea a few days ago. She denies any bowel or bladder disturbances, and denies abdominal pain or vomiting. She reports low back, left hip, and left knee pain that she rates a 7 on a scale of 0-10, managed with morphine  and tramadol. She denies any new skin lesions or concerns. She reports headaches in the right temporal, left temporal, and right parietal  areas. She reports dizziness. She reports weakness in her arms and legs as well as neuropathy in her hands and feet. She reports she has seen floaters in the past but reports seeing a large black "blob" a few days ago that resolved quickly. She reports poor short-term memory but denies episodes of confusion. A complete review of systems is obtained and is otherwise negative.   PHYSICAL EXAM:  Blood pressure 125/70, pulse (!) 57, temperature 98.9 F (37.2 C), temperature source Oral, resp. rate 18, height '5\' 7"'$  (1.702 m), weight 160 lb 9.6 oz (72.8 kg), SpO2 100 %. In general this is a well appearing Caucasian woman in no acute distress. She is alert and oriented x4 and appropriate throughout the examination. HEENT reveals that the patient is normocephalic, atraumatic. Skin is intact without any evidence of gross lesions. Cardiovascular exam reveals a regular rate and rhythm with a holosystolic murmur auscultated, consistent with aortic stenosis. Chest is clear to auscultation bilaterally. Lower extremities are negative for pretibial pitting edema, deep calf tenderness, cyanosis or clubbing. She has decreased motor function in the right lower extremity at the level of the foot with dorsiflexion. Otherwise she has 5/5 strength in the bilateral extremities and intact sensation to touch.   KPS = 40  100 - Normal; no complaints; no evidence of disease. 90   - Able to carry on normal activity; minor signs or symptoms of disease. 80   - Normal activity with effort; some signs or symptoms of disease. 92   - Cares for self; unable to carry on normal activity or to do active work. 60   - Requires occasional assistance, but is able to care for most of his personal needs. 50   - Requires considerable assistance and frequent medical care. 32   - Disabled; requires special care and assistance. 57   - Severely disabled; hospital admission is indicated although death not imminent. 33   - Very sick; hospital  admission necessary; active supportive treatment necessary. 10   - Moribund; fatal processes progressing rapidly. 0     - Dead  Karnofsky DA, [place], Craver LS and Burchenal [facility] [phone]) The use of the nitrogen mustards in the palliative treatment of carcinoma: with particular reference to bronchogenic carcinoma Cancer 1 [phone]  LABORATORY DATA:  Lab Results  Component Value Date   WBC 31.0 (HH) 01/25/2017   HGB 11.3 (L) 01/25/2017   HCT 37.3 01/25/2017   MCV 75.3 (L) 01/25/2017   PLT 423.0 (H) 01/25/2017   Lab Results  Component Value Date   NA 141 01/25/2017   K 3.9 01/25/2017   CL 104 01/25/2017   CO2 28 01/25/2017   Lab Results  Component Value Date   ALT 49 02/09/2016   AST 32 02/09/2016   ALKPHOS 91 02/09/2016   BILITOT 0.6 02/09/2016     RADIOGRAPHY: Ct Chest Wo Contrast  Result Date: 01/25/2017 CLINICAL DATA:  Posterior back pain with hemoptysis. History of breast cancer. EXAM: CT CHEST WITHOUT CONTRAST TECHNIQUE: Multidetector CT imaging of the chest was performed following the standard protocol without IV contrast. COMPARISON:  Abdomen and pelvis CT 12/10/2015. FINDINGS: Cardiovascular: Heart size upper normal. Coronary artery calcification is evident. Atherosclerotic calcification is noted in the wall of the thoracic aorta. Mediastinum/Nodes: No mediastinal lymphadenopathy. Lymphadenopathy identified posterior left hilum although somewhat difficult to assess without intravenous contrast  material. Probable 17 mm superior left hilar lymph node seen on image 56 series 2 with mass-effect on the airway to the superior segment left lower lobe. The esophagus has normal imaging features. Surgical clips noted left axilla. No axillary lymphadenopathy. Lungs/Pleura: Patchy confluent airspace disease is seen in the superior segment left lower lobe with associated pleural nodularity. 9 x 13 mm nodule identified in the left lower lobe on image 51 series 4. 8 mm subpleural left lower  lobe nodule seen on image 101. 7 mm pleural-based nodule seen in the anterior left apex on image 22 and a 2.0 x 2.1 cm pleural-based nodule is seen in the medial left apex on image 18. Scattered tiny pulmonary nodules are seen elsewhere in both lungs. Upper Abdomen: 2.1 cm low-density lesion is identified in the dome of the liver on image 103 series 2, new in the interval since prior study. A second 3.0 cm posterior right liver lesion is seen on image 106. Gallbladder surgically absent. Musculoskeletal: Multiple ill-defined sclerotic lesions are identified including in the manubrium (image 35 series 2), T1 vertebral body (image 16 series 2), T10 vertebral body the and in the left sixth rib (image 54 series 2) were there is associated pathologic fracture. IMPRESSION: 1. Multiple findings in the left hemithorax suspicious for malignancy, including multiple small left pleural nodules with nodularity of the left major fissure, 2.1 cm left apical lesion, lymphadenopathy /central mass lesion in the superior left hilum generating mass-effect on the airway to the superior segment left lower lobe, and 13 mm left lower lobe parenchymal nodule. Given the history of breast cancer, these findings may be related to metastatic disease or interval development of primary bronchogenic neoplasm in the left lung with associated metastatic disease. 2. New lesions in the dome of the liver are highly suspicious for metastatic involvement. Dedicated abdomen and pelvis CT with oral and intravenous contrast would likely prove helpful to further evaluate. 3. Multiple apparent bone metastases, as above. These results will be called to the ordering clinician or representative by the Radiologist Assistant, and communication documented in the PACS or zVision Dashboard. Electronically Signed   By: Misty Stanley M.D.   On: 01/25/2017 13:46   Mr Brain Wo Contrast  Result Date: 01/30/2017 CLINICAL DATA:  Confusion. History of hypertension,  hypercholesterolemia, stroke. History of breast cancer and endovascular embolization of RIGHT PCOM aneurysm. EXAM: MRI HEAD WITHOUT CONTRAST TECHNIQUE: Multiplanar, multiecho pulse sequences of the brain and surrounding structures were obtained without intravenous contrast. COMPARISON:  MRI of the head May 25, 2011 FINDINGS: Multiple sequences are mildly motion degraded. BRAIN: Subcentimeter RIGHT occipital, LEFT temporal lobe and LEFT parietal lobe foci of reduced diffusion in intermediate ADC values in minimal FLAIR T2 hyperintense signal. No susceptibility artifact to suggest hemorrhage. Old LEFT caudate head lacunar infarct. Small area LEFT frontal lobe encephalomalacia. A few additional scattered subcentimeter supratentorial white matter FLAIR T2 hyperintensities exclusive of aforementioned abnormality. Patchy pontine of white matter FLAIR T2 hyperintensities. No midline shift or mass effect. Ventricles and sulci are normal for patient's age. No abnormal extra-axial fluid collections. VASCULAR: Chronic slow flow versus occluded LEFT internal carotid artery. SKULL AND UPPER CERVICAL SPINE: No abnormal sellar expansion. No suspicious calvarial bone marrow signal. Craniocervical junction maintained. SINUSES/ORBITS: The mastoid air-cells and included paranasal sinuses are well-aerated. The included ocular globes and orbital contents are non-suspicious. OTHER: None. IMPRESSION: 1. Mildly motion degraded noncontrast MRI of the head. 2. Three subcentimeter foci of supratentorial signal abnormality concerning for metastatic disease.  3. New nonacute small LEFT frontal lobe/MCA territory infarct. 4. Mild to moderate chronic small vessel ischemic disease. Old LEFT basal ganglia lacunar infarct. 5. These results will be called to the ordering clinician or representative by the Radiologist Assistant, and communication documented in the PACS or zVision Dashboard. Electronically Signed   By: Elon Alas M.D.   On:  01/30/2017 16:07   Mr Cervical Spine Wo Contrast  Result Date: 01/30/2017 CLINICAL DATA:  Confusion.  History of breast and cervical CA. EXAM: MRI CERVICAL SPINE WITHOUT CONTRAST TECHNIQUE: Multiplanar, multisequence MR imaging of the cervical spine was performed. No intravenous contrast was administered. COMPARISON:  No prior MRI of the cervical spine. CT of chest 01/25/2017. FINDINGS: The study was ordered without and with contrast. According to technologist notes, the patient refused contrast, stating she is allergic to it and does not want. Sensitivity in the detection of metastatic disease is diminished in the absence of IV gadolinium. The patient was unable to remain motionless for the exam. Small or subtle lesions could be overlooked. Alignment: Straightening of the normal cervical lordosis. Trace anterolisthesis C4-5, 1-2 mm. Vertebrae: There is a metastatic deposit in the T1 vertebral body, sclerotic, correlating with recent CT scan. There is near complete replacement of that vertebral body. There is also a suspected metastatic deposit in the posterior inferior aspect of the C3 vertebral body, T1 hypointense, T2 hypointense, STIR hyperintense. Less than 1 cm in size. Suspected bone island superior C6 endplate. Cord: No abnormal cord signal. Assessment of epidural tumor or intramedullary disease is hampered in the lack of IV contrast. Mild stenosis at C4-5 appears noncompressive. Posterior Fossa, vertebral arteries, paraspinal tissues: No posterior fossa abnormality. No tonsillar herniation. Flow voids are maintained. No neck masses are evident. 1 cm Lung apex lesion suspected on the LEFT Disc levels: C2-3:  Normal. C3-4: Facet arthropathy worse on the LEFT. Uncinate spurring contributes to LEFT C4 foraminal narrowing. Metastatic disease to the posterior vertebral body without definite epidural tumor. C4-5: 1-2 mm anterolisthesis. Annular bulge. Mild stenosis. Facet arthropathy. LEFT C5 foraminal  narrowing is possible. C5-6:  Annular bulge.  No definite impingement. C6-7: Disc space narrowing. Osseous spurring. Annular bulge. BILATERAL C7 foraminal narrowing suspected. C7-T1: Unremarkable disc space. Facet arthropathy. No impingement. No definite epidural tumor. IMPRESSION: Motion degraded, noncontrast exam of the cervical spine suggesting metastatic disease at C3 and T1. No visible pathologic fracture or epidural tumor. Multilevel spondylosis, with facet arthropathy, uncinate spurring, disc space narrowing, and annular bulging as described. Potentially symptomatic foraminal narrowing at multiple levels. See discussion above. Suspected 1 cm LEFT apex lung lesion. Electronically Signed   By: Staci Righter M.D.   On: 01/30/2017 16:23   Mr Thoracic Spine W Wo Contrast  Result Date: 02/03/2017 CLINICAL DATA:  Breast cancer, possible bone Mets. EXAM: MRI THORACIC AND LUMBAR SPINE WITHOUT AND WITH CONTRAST TECHNIQUE: Multiplanar and multiecho pulse sequences of the thoracic and lumbar spine were obtained without and with intravenous contrast. CONTRAST:  109m MULTIHANCE GADOBENATE DIMEGLUMINE 529 MG/ML IV SOLN COMPARISON:  CTA chest 01/25/2017. FINDINGS: MRI THORACIC SPINE FINDINGS Alignment:  Anatomic. Vertebrae: Metastatic deposits are seen at multiple levels. T1 there is near complete replacement of the vertebral body by tumor. Patient is at risk for pathologic fracture. At T10, there is a bilobed lesion, each approximately 1 cm diameter, apparent central necrosis, mild peripheral postcontrast enhancement. Additionally at T10 there is a RIGHT pedicle and transverse process lesion, approximately 1 x 2 cm. No epidural tumor or tremor  the foramen. I went there is a 5 mm lesion in T12 inferiorly. This is located on the LEFT. Cord:  Normal signal and morphology. Paraspinal and other soft tissues: Paravertebral tumor at T5-T6 on the LEFT. Please see CT chest for additional findings. Disc levels: No disc  protrusion or spinal stenosis. MRI LUMBAR SPINE FINDINGS Segmentation:  Standard. Alignment:  Trace anterolisthesis L4-5, facet mediated. Vertebrae: Metastatic deposits in the L4 and L5 vertebrae. L4 lesion is significant size, almost 50% ridge body involvement without pathologic fracture. It is located in the RIGHT half of the vertebral body. No associated epidural tumor. Smaller lesion in the LEFT L5 vertebral body, posterior midline, slightly eccentric to LEFT. 15 mm largest dimension. Again no epidural tumor. Postcontrast enhancement associated with both the L4 and L5 lesions. Bone island RIGHT S1. Conus medullaris: Extends to the L1-L2 level and appears normal. Paraspinal and other soft tissues: Unremarkable. Disc levels: L1-L2:  Normal. L2-L3:  Normal. L3-L4:  Normal. L4-L5: Trace anterolisthesis. Moderately advanced facet arthropathy. Foraminal protrusion on the RIGHT. RIGHT L4 nerve root impingement. L5-S1: Unremarkable disc space. Facet arthropathy. No impingement. IMPRESSION: THORACIC: Metastatic disease at T1, T10, and T12. No epidural tumor. Near complete replacement T1. No thoracic disc protrusion. No intraspinal mass lesion or cord abnormality. LUMBAR: Osseous metastatic disease L4 and L5, larger at L4. See discussion above. No epidural tumor, but foraminal protrusion at L4-5 on the RIGHT, could affect the RIGHT L4 nerve root. Electronically Signed   By: Staci Righter M.D.   On: 02/03/2017 15:13   Mr Lumbar Spine W Wo Contrast  Result Date: 02/03/2017 CLINICAL DATA:  Breast cancer, possible bone Mets. EXAM: MRI THORACIC AND LUMBAR SPINE WITHOUT AND WITH CONTRAST TECHNIQUE: Multiplanar and multiecho pulse sequences of the thoracic and lumbar spine were obtained without and with intravenous contrast. CONTRAST:  58m MULTIHANCE GADOBENATE DIMEGLUMINE 529 MG/ML IV SOLN COMPARISON:  CTA chest 01/25/2017. FINDINGS: MRI THORACIC SPINE FINDINGS Alignment:  Anatomic. Vertebrae: Metastatic deposits are seen  at multiple levels. T1 there is near complete replacement of the vertebral body by tumor. Patient is at risk for pathologic fracture. At T10, there is a bilobed lesion, each approximately 1 cm diameter, apparent central necrosis, mild peripheral postcontrast enhancement. Additionally at T10 there is a RIGHT pedicle and transverse process lesion, approximately 1 x 2 cm. No epidural tumor or tremor the foramen. I went there is a 5 mm lesion in T12 inferiorly. This is located on the LEFT. Cord:  Normal signal and morphology. Paraspinal and other soft tissues: Paravertebral tumor at T5-T6 on the LEFT. Please see CT chest for additional findings. Disc levels: No disc protrusion or spinal stenosis. MRI LUMBAR SPINE FINDINGS Segmentation:  Standard. Alignment:  Trace anterolisthesis L4-5, facet mediated. Vertebrae: Metastatic deposits in the L4 and L5 vertebrae. L4 lesion is significant size, almost 50% ridge body involvement without pathologic fracture. It is located in the RIGHT half of the vertebral body. No associated epidural tumor. Smaller lesion in the LEFT L5 vertebral body, posterior midline, slightly eccentric to LEFT. 15 mm largest dimension. Again no epidural tumor. Postcontrast enhancement associated with both the L4 and L5 lesions. Bone island RIGHT S1. Conus medullaris: Extends to the L1-L2 level and appears normal. Paraspinal and other soft tissues: Unremarkable. Disc levels: L1-L2:  Normal. L2-L3:  Normal. L3-L4:  Normal. L4-L5: Trace anterolisthesis. Moderately advanced facet arthropathy. Foraminal protrusion on the RIGHT. RIGHT L4 nerve root impingement. L5-S1: Unremarkable disc space. Facet arthropathy. No impingement. IMPRESSION: THORACIC: Metastatic disease  at T1, T10, and T12. No epidural tumor. Near complete replacement T1. No thoracic disc protrusion. No intraspinal mass lesion or cord abnormality. LUMBAR: Osseous metastatic disease L4 and L5, larger at L4. See discussion above. No epidural tumor,  but foraminal protrusion at L4-5 on the RIGHT, could affect the RIGHT L4 nerve root. Electronically Signed   By: Staci Righter M.D.   On: 02/03/2017 15:13   Nm Pulmonary Per & Vent  Result Date: 01/25/2017 CLINICAL DATA:  Left-sided upper back pain. EXAM: NUCLEAR MEDICINE VENTILATION - PERFUSION LUNG SCAN TECHNIQUE: Ventilation images were obtained in multiple projections using inhaled aerosol Tc-25mDTPA. Perfusion images were obtained in multiple projections after intravenous injection of Tc-991mAA. RADIOPHARMACEUTICALS:  Thirty mCi Technetium-9958mPA aerosol inhalation and 4 mCi Technetium-33m34m IV COMPARISON:  Chest CT from the same date. FINDINGS: Perfusion: No mismatched wedge shaped peripheral perfusion defects to suggest acute pulmonary embolism. There is a matched perfusion/ ventilation defect in the left posterior mid thorax, possibly correlating to the confluent airspace disease seen in the superior segment of the left lower lobe on current CT. IMPRESSION: Low probability for pulmonary embolus. Matched perfusion/ventilation defect in the left posterior mid thorax, possibly correlating to the confluent airspace disease seen in the superior segment of the left lower lobe on current CT. Electronically Signed   By: DobrFidela Salisbury.   On: 01/25/2017 14:20   Nm Pet Image Initial (pi) Skull Base To Thigh  Result Date: 02/06/2017 CLINICAL DATA:  INITIAL treatment strategy for carcinoma unknown primary. History treated breast cancer EXAM: NUCLEAR MEDICINE PET SKULL BASE TO THIGH TECHNIQUE: 7.9 mCi F-18 FDG was injected intravenously. Full-ring PET imaging was performed from the skull base to thigh after the radiotracer. CT data was obtained and used for attenuation correction and anatomic localization. FASTING BLOOD GLUCOSE:  Value: 109 mg/dl COMPARISON:  CT 12/10/2015 FINDINGS: NECK No hypermetabolic lymph nodes in the neck. CHEST Hypermetabolic nodule in the superior medial apex of the LEFT  lung measures 1.7 cm with SUV max equals 6.1. hypermetabolic perihilar lymph node posterior to the LEFT hilum with SUV max equal 9.3 and measuring 2.0 cm (image 60, series 4) Hypermetabolic nodular thickening in the superior segment of the LEFT lower lobe with SUV max equal 6.4. There is no abnormal metabolic activity the LEFT or RIGHT breast tissue. No hypermetabolic axillary lymph nodes. ABDOMEN/PELVIS Intensely metabolic lesion in the dome the liver with SUV max equal 8.0. This corresponds to low-density lesion measuring approximately 2.3 cm. There is a second lesion within the caudate lobe of the liver measuring 2.2 cm with SUV max equal 12.8. No hypermetabolic abdominopelvic lymph nodes. No abnormal metabolic activity in pancreas kidneys or bowel. Physiologic activity in the bowel noted. SKELETON Multiple sites of hypermetabolic skeletal metastasis with subtle bony changes on CT portion exam. For example lesion in the medial aspect of the RIGHT iliac bone with SUV max equal 11.3 and the sclerotic medullary changes. rounded lesion in the L4 vertebral body measuring 1.9 cm with SUV max equal 6.7. Lesion in the manubrium with SUV max equal 7.1. Additional rib lesions are present. Pathologic fracture of the LEFT lateral rib (sixth rib). Lesions in the posterior LEFT acetabulum and proximal bilateral proximal femour lesions at the level of the lesser trochanters. IMPRESSION: 1. Stage IV carcinoma with differential including metastatic breast cancer versus primary lung cancer. Favor primary lung cancer. 2. Pleural-based hypermetabolic lesion in the LEFT upper lobe and LEFT lower lobe and LEFT hilar adenopathy  concerning for bronchogenic carcinoma. 3. Two hypermetabolic liver metastasis. 4. Multiple foci of skeletal metastasis including the proximal femurs, pelvis, spine and sternum. Pathologic fracture of a LEFT sixth rib. Electronically Signed   By: Suzy Bouchard M.D.   On: 02/06/2017 09:55      IMPRESSION:  This is a 67 y.o. woman with probable stage IV non small cell lung cancer, pending biopsy. She has multiple painful sites of skeletal involvement and may benefit from palliative radiation once her diagnosis is finalized. In addition, recent brain MRI without contrast suggested possible metastatic involvement of the brain, and she will require additional brain imaging with contrast. The patient reports some question of contrast allergy in the past. In reviewing her records, she has had multiple MRIs with contrast without any adverse reaction documented.  PLAN: Today, Dr. Tammi Klippel reviewed the findings and workup thus far with the patient. We discussed palliative radiotherapy options including stereotactic radiosurgery. The patient seems to understand the treatment options and would like to proceed. We will first need to obtain an MRI of her brain with contrast and a bone marrow biopsy prior to the start of radiation. She will follow up with [place] after her MRI and biopsy.  We spent time face to face with the patient and more than 50% of that time was spent in counseling and/or coordination of care.     Carola Rhine, PAC    Tyler Pita, MD  [facility] Direct Dial: [phone]  Fax: [phone] [email or web address]  Skype  [email or web address]    Page Me   This document serves as a record of services personally performed by Tyler Pita, MD and Shona Simpson, PA-C. It was created on their behalf by Rae Lips, a trained medical scribe. The creation of this record is based on the scribe's personal observations and the providers' statements to them. This document has been checked and approved by the attending providers.

## 2017-02-15 NOTE — Progress Notes (Signed)
Location/Histology of Brain Tumor: Stage IV cancer. Hx of breast (2004) and cervical (1977) ca. Now with metastatic breast ca vs primary lung ca (pt understands from [place] its primary lung). Metastatic lesions to bone, spine, and brain.  Patient presented with symptoms of:  Severe pleuritic pain and hemoptysis end of November 2018.  Past or anticipated interventions, if any, per neurosurgery: No  Past or anticipated interventions, if any, per medical oncology: denies hx of chemotherapy  Dose of Decadron, if applicable: Medrol 8 mg daily. Denies taking/being prescribed decadron.  Recent neurologic symptoms, if any:   Seizures: no  Headaches: yes, right temporal, right parietal, or left temporal  Nausea: one episode of nausea a few days ago but no vomiting  Dizziness/ataxia: yes  Difficulty with hand coordination: no  Focal numbness/weakness: weakness in arms and leg plus neuropathy in hands and feet  Visual deficits/changes: has had floaters in the past but reports seeing a large black blob a few days ago that resolved quickly  Confusion/Memory deficits: poor short term memory but denies episodes of confusion   Painful bone metastases at present, if any: reports low back and left hip pain 7 on a scale of 0-10. Reports taking morphine short and long acting plus tramadol to manage pain  SAFETY ISSUES:  Prior radiation? No  Pacemaker/ICD?  NO  Possible current pregnancy?  No  Is the patient on methotrexate? On several different immune modulators but NOT methotrexate  Additional Complaints / other details: 66 year old female. Hx of DVT, left breast ca, cervical ca, diabetes, stroke, cerebral aneurysm and heart murmur    PET SCAN 02/06/17:,  IMPRESSION: 1. Stage IV carcinoma with differential including metastatic breast cancer versus primary lung cancer. Favor primary lung cancer. 2. Pleural-based hypermetabolic lesion in the LEFT upper lobe and LEFT lower lobe and LEFT  hilar adenopathy concerning for bronchogenic carcinoma. 3. Two hypermetabolic liver metastasis. 4. Multiple foci of skeletal metastasis including the proximal femurs, pelvis, spine and sternum. Pathologic fracture of a LEFT sixth rib.   BRAIN: Subcentimeter RIGHT occipital, LEFT temporal lobe and LEFT parietal lobe foci of reduced diffusion in intermediate ADC valuesin minimal FLAIR T2 hyperintense signal. No susceptibility artifactto suggest hemorrhage. Old LEFT caudate head lacunar infarct. Smallarea LEFT frontal lobe encephalomalacia. A few additional scatteredsubcentimeter supratentorial white matter FLAIR T2 hyperintensitiesexclusive of aforementioned abnormality. Patchy pontine of white matter FLAIR T2 hyperintensities. No midline shift or mass effect.Ventricles and sulci are normal for patient's age. No abnormal extra-axial fluid collections.  CT SIM at 2 pm today.  U/S LIVER biopsy 02-17-17 (pt understands this is supposed to be cancelled.) Bone marrow biopsy 02-21-17 Intramedullary nail bilateral 02-28-17 

## 2017-02-15 NOTE — Progress Notes (Signed)
See progress note under physician encounter. 

## 2017-02-16 ENCOUNTER — Other Ambulatory Visit: Payer: Self-pay | Admitting: Radiology

## 2017-02-16 NOTE — H&P (Signed)
[facility]  [address] [place], [place] [address] [phone]   RE: [place], [place]   [id number]      DOB: 11/23/1950  02-13-17  REASON FOR VISIT: Bilateral pathologic fractures pending to her femurs.   HPI:   Janeah is a pleasant 67 year old who has been treated previously for her shoulder fracture. She unfortunately has a new cancer diagnosis with Bone Scan that demonstrates large metastatic lesions to her intertrochanteric and subtrochanteric region of her bilateral femurs. She has pain with weight bearing. She has also developed a foot drop on the right.   EXAMINATION: Well appearing female no apparent distress. She does have an antalgic gait as well as a foot drop on the right. Neurovascularly intact.   IMAGES: X-rays reviewed by me:   X-rays show no obvious fracture at this time. The PET scan did show large lesions.   ASSESSMENT & PLAN: We had a long talk about her options. Given her pain and large lesions, I think she is at high risk for completing a pathologic fracture.  I would recommend IM nailing prior to this. We discussed using bracing for her foot drop. She would like to hold off on that. I strongly recommend she use a walker. She would like to wait on doing her IM  nailing until after her bone biopsy. I discussed the risk of completing fracture. In light of that, she would still like to hold off for a few days.    Ernesta Amble.  Percell Miller, M.D.  Electronically verified by Ernesta Amble. Percell Miller, M.D. TDMDelorise Royals D 02-13-17 T  02-16-17

## 2017-02-17 ENCOUNTER — Other Ambulatory Visit: Payer: Medicare Other

## 2017-02-17 ENCOUNTER — Ambulatory Visit
Admission: RE | Admit: 2017-02-17 | Discharge: 2017-02-17 | Disposition: A | Discharge status: Home / Self Care | Payer: Medicare Other | Source: Ambulatory Visit | Attending: Radiation Oncology | Admitting: Radiation Oncology

## 2017-02-17 ENCOUNTER — Ambulatory Visit (HOSPITAL_COMMUNITY)
Admission: RE | Admit: 2017-02-17 | Discharge: 2017-02-17 | Disposition: A | Discharge status: Home / Self Care | Payer: Medicare Other | Source: Ambulatory Visit | Attending: Internal Medicine | Admitting: Internal Medicine

## 2017-02-17 DIAGNOSIS — C7931 Secondary malignant neoplasm of brain: Secondary | ICD-10-CM

## 2017-02-17 DIAGNOSIS — C7949 Secondary malignant neoplasm of other parts of nervous system: Principal | ICD-10-CM

## 2017-02-20 ENCOUNTER — Ambulatory Visit: Payer: Medicare Other | Admitting: Radiation Oncology

## 2017-02-20 ENCOUNTER — Ambulatory Visit (HOSPITAL_COMMUNITY)
Admission: RE | Admit: 2017-02-20 | Discharge: 2017-02-20 | Disposition: A | Discharge status: Home / Self Care | Payer: Medicare Other | Source: Ambulatory Visit | Attending: Radiation Oncology | Admitting: Radiation Oncology

## 2017-02-20 ENCOUNTER — Ambulatory Visit: Payer: Medicare Other

## 2017-02-20 ENCOUNTER — Other Ambulatory Visit: Payer: Self-pay | Admitting: Radiology

## 2017-02-20 DIAGNOSIS — Z86000 Personal history of in-situ neoplasm of breast: Secondary | ICD-10-CM | POA: Insufficient documentation

## 2017-02-20 DIAGNOSIS — E7800 Pure hypercholesterolemia, unspecified: Secondary | ICD-10-CM | POA: Insufficient documentation

## 2017-02-20 DIAGNOSIS — C7951 Secondary malignant neoplasm of bone: Secondary | ICD-10-CM | POA: Diagnosis not present

## 2017-02-20 DIAGNOSIS — K219 Gastro-esophageal reflux disease without esophagitis: Secondary | ICD-10-CM | POA: Insufficient documentation

## 2017-02-20 DIAGNOSIS — Z86010 Personal history of colonic polyps: Secondary | ICD-10-CM | POA: Diagnosis not present

## 2017-02-20 DIAGNOSIS — R042 Hemoptysis: Secondary | ICD-10-CM | POA: Insufficient documentation

## 2017-02-20 DIAGNOSIS — C3490 Malignant neoplasm of unspecified part of unspecified bronchus or lung: Secondary | ICD-10-CM | POA: Diagnosis not present

## 2017-02-20 DIAGNOSIS — Z85118 Personal history of other malignant neoplasm of bronchus and lung: Secondary | ICD-10-CM | POA: Diagnosis not present

## 2017-02-20 DIAGNOSIS — Z8673 Personal history of transient ischemic attack (TIA), and cerebral infarction without residual deficits: Secondary | ICD-10-CM | POA: Insufficient documentation

## 2017-02-20 DIAGNOSIS — R0609 Other forms of dyspnea: Secondary | ICD-10-CM | POA: Insufficient documentation

## 2017-02-20 DIAGNOSIS — C801 Malignant (primary) neoplasm, unspecified: Secondary | ICD-10-CM | POA: Insufficient documentation

## 2017-02-20 DIAGNOSIS — Z888 Allergy status to other drugs, medicaments and biological substances status: Secondary | ICD-10-CM | POA: Insufficient documentation

## 2017-02-20 DIAGNOSIS — Z85841 Personal history of malignant neoplasm of brain: Secondary | ICD-10-CM | POA: Insufficient documentation

## 2017-02-20 DIAGNOSIS — E119 Type 2 diabetes mellitus without complications: Secondary | ICD-10-CM | POA: Diagnosis not present

## 2017-02-20 DIAGNOSIS — I6522 Occlusion and stenosis of left carotid artery: Secondary | ICD-10-CM | POA: Diagnosis not present

## 2017-02-20 DIAGNOSIS — I7300 Raynaud's syndrome without gangrene: Secondary | ICD-10-CM | POA: Insufficient documentation

## 2017-02-20 DIAGNOSIS — Z885 Allergy status to narcotic agent status: Secondary | ICD-10-CM | POA: Insufficient documentation

## 2017-02-20 DIAGNOSIS — Z79899 Other long term (current) drug therapy: Secondary | ICD-10-CM | POA: Insufficient documentation

## 2017-02-20 DIAGNOSIS — Z91040 Latex allergy status: Secondary | ICD-10-CM | POA: Insufficient documentation

## 2017-02-20 DIAGNOSIS — R590 Localized enlarged lymph nodes: Secondary | ICD-10-CM | POA: Diagnosis not present

## 2017-02-20 DIAGNOSIS — Z8541 Personal history of malignant neoplasm of cervix uteri: Secondary | ICD-10-CM | POA: Diagnosis not present

## 2017-02-20 DIAGNOSIS — R51 Headache: Secondary | ICD-10-CM | POA: Diagnosis not present

## 2017-02-20 DIAGNOSIS — Z882 Allergy status to sulfonamides status: Secondary | ICD-10-CM | POA: Insufficient documentation

## 2017-02-20 DIAGNOSIS — Z886 Allergy status to analgesic agent status: Secondary | ICD-10-CM | POA: Insufficient documentation

## 2017-02-20 DIAGNOSIS — Z9049 Acquired absence of other specified parts of digestive tract: Secondary | ICD-10-CM | POA: Insufficient documentation

## 2017-02-20 DIAGNOSIS — D72829 Elevated white blood cell count, unspecified: Secondary | ICD-10-CM | POA: Insufficient documentation

## 2017-02-20 DIAGNOSIS — M069 Rheumatoid arthritis, unspecified: Secondary | ICD-10-CM | POA: Insufficient documentation

## 2017-02-20 DIAGNOSIS — M549 Dorsalgia, unspecified: Secondary | ICD-10-CM | POA: Diagnosis not present

## 2017-02-20 DIAGNOSIS — I639 Cerebral infarction, unspecified: Secondary | ICD-10-CM | POA: Insufficient documentation

## 2017-02-20 DIAGNOSIS — Z90722 Acquired absence of ovaries, bilateral: Secondary | ICD-10-CM | POA: Insufficient documentation

## 2017-02-20 DIAGNOSIS — I10 Essential (primary) hypertension: Secondary | ICD-10-CM | POA: Diagnosis not present

## 2017-02-20 DIAGNOSIS — Z7984 Long term (current) use of oral hypoglycemic drugs: Secondary | ICD-10-CM | POA: Diagnosis not present

## 2017-02-20 DIAGNOSIS — R110 Nausea: Secondary | ICD-10-CM | POA: Insufficient documentation

## 2017-02-20 DIAGNOSIS — Z96651 Presence of right artificial knee joint: Secondary | ICD-10-CM | POA: Insufficient documentation

## 2017-02-20 DIAGNOSIS — D509 Iron deficiency anemia, unspecified: Secondary | ICD-10-CM | POA: Diagnosis not present

## 2017-02-20 DIAGNOSIS — K769 Liver disease, unspecified: Secondary | ICD-10-CM | POA: Insufficient documentation

## 2017-02-20 DIAGNOSIS — Z90710 Acquired absence of both cervix and uterus: Secondary | ICD-10-CM | POA: Insufficient documentation

## 2017-02-20 DIAGNOSIS — R0789 Other chest pain: Secondary | ICD-10-CM | POA: Insufficient documentation

## 2017-02-20 MED ORDER — GADOBENATE DIMEGLUMINE 529 MG/ML IV SOLN
15.0000 mL | Freq: Once | INTRAVENOUS | Status: AC | PRN
  Administered 2017-02-20: 15 mL via INTRAVENOUS

## 2017-02-20 NOTE — Assessment & Plan Note (Signed)
67 y.o. 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  Currently presenting with what appears to be a widely metastatic malignancy of unknown primary.  Known sites of disease involving left lung, hilar lymphadenopathy, possibly multifocal brain disease, liver lesions, and multifocal skeletal disease including multilevel spinal involvement without cord compression.  Plan: --Priority is to establish a tissue diagnosis.  Easiest site to access are the lesions in the pelvic bones which can be easily accessed with the procedure some of the bone marrow biopsy.  Defer liver biopsy in case bone biopsy from the pelvis is nondiagnostic. --Consult interventional radiology for bone biopsy from the right side of the pelvis -- Consult orthopedic surgery for possible need for prophylactic bilateral femoral fixation due to presence of significant metastatic disease. --Return to my clinic in 1 week for pain management.

## 2017-02-20 NOTE — Progress Notes (Signed)
[facility] New Visit:  Assessment: Multiple lesions of metastatic malignancy [facility]) 67 y.o. 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  Currently presenting with what appears to be a widely metastatic malignancy of unknown primary.  Known sites of disease involving left lung, hilar lymphadenopathy, possibly multifocal brain disease, liver lesions, and multifocal skeletal disease including multilevel spinal involvement without cord compression.  Plan: --Priority is to establish a tissue diagnosis.  Easiest site to access are the lesions in the pelvic bones which can be easily accessed with the procedure some of the bone marrow biopsy.  Defer liver biopsy in case bone biopsy from the pelvis is nondiagnostic. --Consult interventional radiology for bone biopsy from the right side of the pelvis -- Consult orthopedic surgery for possible need for prophylactic bilateral femoral fixation due to presence of significant metastatic disease. --Return to my clinic in 1 week for pain management.  Voice recognition software was used and creation of this note. Despite my best effort at editing the text, some misspelling/errors may have occurred.  Orders Placed This Encounter  Procedures  . CT Biopsy    Standing Status:   Future    Standing Expiration Date:   02/13/2018    Order Specific Question:   Lab orders requested (DO NOT place separate lab orders, these will be automatically ordered during procedure specimen collection):    Answer:   Cytology - Non Pap    Comments:   Save sample for flow, fish, molecular studies (poss lung cancer)    Order Specific Question:   Lab orders requested (DO NOT place separate lab orders, these will be automatically ordered during procedure specimen collection):    Answer:   Surgical Pathology    Order Specific Question:   Reason for Exam (SYMPTOM  OR DIAGNOSIS REQUIRED)    Answer:   Metastatic malignancy with widespread skeletal lesions including BL pelvis. Need tissue diagnosis    Order Specific Question:   Preferred imaging location?    Answer:   [facility]    Order Specific Question:   Radiology Contrast Protocol - do NOT remove file path    Answer:   [email or web address]  . CT BONE MARROW BIOPSY & ASPIRATION    Standing Status:   Future    Standing Expiration Date:   05/15/2018    Order Specific Question:   Reason for Exam (SYMPTOM  OR DIAGNOSIS REQUIRED)    Answer:   Metastatic malignancy with widespread skeletal lesions including BL pelvis. Need tissue diagnosis    Order Specific Question:   Preferred imaging location?    Answer:   [facility]    Order Specific Question:   Radiology Contrast Protocol - do NOT remove file path    Answer:   [email or web address]  . Ambulatory referral to Radiation Oncology    Referral Priority:   Urgent    Referral Type:   Consultation    Referral Reason:   Specialty Services Required    Requested Specialty:   Radiation Oncology    Number of Visits Requested:   1  . AMB referral to orthopedics    Referral Priority:   Urgent    Referral Type:   Consultation    Referred to Provider:   Elsie Saas, MD    Number of Visits Requested:   1    All questions were answered.  . The patient knows to call the clinic with any problems,  questions or concerns.  This note was electronically signed.    History of Presenting Illness [name] 67 y.o. presenting to the [place] for discovery of findings worrisome for metastatic malignancy.  Please see oncological history below for details.  Patient's past medical history significant for history of breast cancer diagnosed in 2004, treated surgically with lumpectomy with DCIS on pathology.  Additionally, history of deep vein thrombosis in the left lower extremity in December 2017 treated with  Xarelto for 6 months.  Currently undergoing treatment with prednisone and Orencia for diagnosis of rheumatoid arthritis.  Her social history is significant for 30-pack-year tobacco smoking, patient quit in 1980s.  No family history of malignancy.    Patient initially presented on January 06, 2017 with interscapular back pain which was new to her.  On 01/12/17, patient had a 5-day episode of hemoptysis that resolved spontaneously.  Since initial presentation, patient had progressive worsening of the back pain.  Initially, plain films of the back were obtained demonstrating an abnormality at T10 which went to additional imaging outlined below.  Patient has not found a biopsy yet, one scheduled from the liver lesions for Friday this week.  At this time, patient denies fevers,  night sweats.  Is complaining of chills and fatigue.  She has been feeling intermittent dizziness, numbness and burning in feet and fingers, generalized weakness, nausea, and swelling in bilateral lower extremities.  She is receiving pain medications with both short-acting and long-acting forms, she rates her pain control is suboptimal with current pain in the lower back and knees 6 out of 10.  Oncological/hematological History: --MRI C Spine, 01/30/17: Abnormality at C3 & T1 --sclerotic lesion T1 with near complete replacement of the vertebral body. --MRI Brain, 01/30/17: 3 subcentimeter lesions concerning for possible metastatic disease. --MRI T/L spine, 02/03/17: Metastatic deposits at multiple levels including previously described T1 lesion.  Bilobed lesion at the T10 approximately 1 cm diameter with central necrosis.  Additional right pedicle and transverse process lesion at T10 measuring 2.0 cm.  No epidural tumor.  0.5 cm at T12 on the left.  Paravertebral tumor at T5/T6 on the left.  Metastatic deposits in L4 and L5 with almost 50% replacement of L4 without pathological fracture.  No evidence of cord compression.  Right L4 nerve  root impingement noted. --PET-CT, 02/06/17: 1.7cm LtUL mass, SUVmax 6.1, Lt perihilar LAD, 2.0cm, SUVmax 9.3. No axillary lymphadenopathy, no abnormal enhancement in the breasts.  2.2 cm lesion in the dome of the liver, SUV max 8.0.  2.2 cm lesion in the caudate lobe of the liver, SUV max 12.8.  Multiple hypermetabolic skeletal lesions including bilateral proximal femoral lesions.     Medical History: Past Medical History:  Diagnosis Date  . Anemia   . Angina   . Arthritis    Rheumatoid  . Brain cancer ([place])   . Breast cancer ([place])   . Cancer ([place])    left breast, cervical cancer  . Colon polyps   . Constipation   . Diarrhea   . GERD (gastroesophageal reflux disease)   . Heart murmur   . Hypercholesterolemia   . Hypertension   . Lung cancer ([place])   . Numbness and tingling in right hand   . Peripheral vascular disease ([place])    cerebrel aneurysm /sp coil  . PONV (postoperative nausea and vomiting)   . Rheumatoid arthritis with rheumatoid factor (HCC)   . Right knee DJD   . Stroke [facility])    jan 2012  Surgical History: Past Surgical History:  Procedure Laterality Date  . ABDOMINAL HYSTERECTOMY  1977  . APPENDECTOMY  1964  . BILATERAL OOPHORECTOMY    . CHOLECYSTECTOMY    . COLONOSCOPY  12/09/2010   Procedure: COLONOSCOPY;  Surgeon: Rogene Houston, MD;  [facility];  Service: Endoscopy;  Laterality: N/A;  . COLONOSCOPY N/A 06/26/2015   Procedure: COLONOSCOPY;  Surgeon: Rogene Houston, MD;  [facility];  Service: Endoscopy;  Laterality: N/A;  1:05-moved to [address] to notify pt  . ESOPHAGOGASTRODUODENOSCOPY N/A 06/26/2015   Procedure: ESOPHAGOGASTRODUODENOSCOPY (EGD);  Surgeon: Rogene Houston, MD;  [facility];  Service: Endoscopy;  Laterality: N/A;  . GIVENS CAPSULE STUDY N/A 08/20/2015   Procedure: GIVENS CAPSULE STUDY;  Surgeon: Rogene Houston, MD;  [facility];  Service: Endoscopy;  Laterality: N/A;  730  . left breast  lumpectomy  2004  . left knee arthroscopy    . Right brain aneurysm  04/2010   Coiled   . right wrist surgery  1999   synevectomy  . TOTAL KNEE ARTHROPLASTY Right 02/25/2013   Procedure: TOTAL KNEE ARTHROPLASTY;  Surgeon: Lorn Junes, MD;  Location: [place];  Service: Orthopedics;  Laterality: Right;    Family History: Family History  Problem Relation Age of Onset  . Hypertension Father   . Heart attack Father   . Heart disease Maternal Uncle   . Cancer Maternal Aunt        breast  . Cancer Other        kidney  . Cancer Cousin   . Colon cancer Neg Hx     Social History: Social History   Socioeconomic History  . Marital status: Married    Spouse name: Hedy Camara  . Number of children: o  . Years of education: 5  . Highest education level: Not on file  Social Needs  . Financial resource strain: Not on file  . Food insecurity - worry: Not on file  . Food insecurity - inability: Not on file  . Transportation needs - medical: Not on file  . Transportation needs - non-medical: Not on file  Occupational History  . Occupation: retired    Fish farm manager: Development worker, [employer]: retired january 2017  Tobacco Use  . Smoking status: Former Smoker    Packs/day: 2.00    Years: 15.00    Pack years: 30.00    Last attempt to quit: 02/20/1984    Years since quitting: 33.0  . Smokeless tobacco: Never Used  . Tobacco comment: Quit 1986  Substance and Sexual Activity  . Alcohol use: No  . Drug use: No  . Sexual activity: Not Currently  Other Topics Concern  . Not on file  Social History Narrative   Employed with [employer]. Retired January 2017. Married with no children. Quit smoking 1986. No alcohol or illicit drug use.    Right handed    Some college.   Caffeine one cup of tea daily.    Allergies: Allergies  Allergen Reactions  . Aurothioglucose [Solganal] Other (See Comments)    REACTION: VASCULITIS   . Calcium Channel Blockers Palpitations  .  Feldene [Piroxicam] Anaphylaxis and Other (See Comments)    SKIN BLISTERS  . Hydralazine Other (See Comments)    REACTION: Chest pains and headaches: IV only  . Imdur [Isosorbide Mononitrate] Other (See Comments)    REACTION: PATIENT IS UNABLE TO WALK OR FUNCTION  .  Imuran [Azathioprine] Other (See Comments)    Could not walk or function  . Methotrexate Derivatives Other (See Comments)    REACTION: VASCULITIS  . Neomycin Rash  . Nickel Rash  . Thimerosal Other (See Comments)    SKIN BLISTERED  . Percocet [Oxycodone-Acetaminophen] Other (See Comments)    REACTION : INSOMNIA  . Vioxx [Rofecoxib] Other (See Comments)    REACTION: CAUSED MORE JOINT PAIN  . Arava [Leflunomide] Other (See Comments)    REACTION: UNKNOWN  . Asacol [Mesalamine] Other (See Comments)    REACTION: UNKNOWN  . Hydrocodone Itching  . Iohexol Other (See Comments)     Desc: PT STATES SHE WAS TOLD NEVER TO HAVE CT CONTRAST AGAIN.  SHE HAD SOME KIND OF REACTION BUT DOESNT REMEBER WHAT HAPPENED   . Sulfa Antibiotics Diarrhea and Nausea And Vomiting  . Latex Rash  . Nsaids Other (See Comments)    GI UPSET , CAN TOLERATE SOME NSAIDS    Medications:  Current Outpatient Medications  Medication Sig Dispense Refill  . allopurinol (ZYLOPRIM) 300 MG tablet Take 300 mg by mouth daily.     Marland Kitchen atorvastatin (LIPITOR) 20 MG tablet Take 20 mg by mouth at bedtime.    . Calcium Carbonate-Vit D-Min (CALTRATE 600+D PLUS) 600-400 MG-UNIT per tablet Chew 1 tablet by mouth 2 (two) times daily.      . Cholecalciferol (VITAMIN D) 2000 UNITS tablet Take 2,000 Units by mouth daily.     . DULoxetine (CYMBALTA) 60 MG capsule Take 60 mg by mouth daily.    Marland Kitchen FIBER SELECT GUMMIES PO Take 2 each by mouth 2 (two) times daily.     . hydroxychloroquine (PLAQUENIL) 200 MG tablet Take 200 mg by mouth daily.     Marland Kitchen ipratropium (ATROVENT) 0.06 % nasal spray Place 2 sprays into both nostrils 2 (two) times daily as needed for rhinitis.    Marland Kitchen MAGNESIUM  OXIDE PO Take 500 mg by mouth daily.     . metFORMIN (GLUCOPHAGE-XR) 500 MG 24 hr tablet Take 500 mg by mouth 2 (two) times a week.    . morphine (MS CONTIN) 15 MG 12 hr tablet Take 1 tablet (15 mg total) by mouth every 12 (twelve) hours. 60 tablet 0  . morphine 20 MG/5ML solution Take 1 mL (4 mg total) by mouth 2 (two) times daily as needed for pain. (Patient taking differently: Take 4 mg by mouth 4 (four) times daily as needed for pain (breakthrough pain). ) 60 mL 0  . nebivolol (BYSTOLIC) 5 MG tablet Take 5 mg by mouth every evening.     Marland Kitchen omeprazole (PRILOSEC) 40 MG capsule TAKE (1) CAPSULE BY MOUTH ONCE DAILY. (Patient taking differently: TAKE 40 MG BY MOUTH ONCE DAILY.) 30 capsule 11  . Polyethyl Glycol-Propyl Glycol (SYSTANE) 0.4-0.3 % GEL Apply 1 application to eye daily as needed (for dry eyes).     . methylPREDNISolone (MEDROL) 8 MG tablet Take 8 mg by mouth daily.     No current facility-administered medications for this visit.     Review of Systems: Review of Systems  Respiratory: Positive for hemoptysis.   Musculoskeletal: Positive for back pain.  All other systems reviewed and are negative.    PHYSICAL EXAMINATION Blood pressure (!) 131/41, pulse 80, temperature 98.7 F (37.1 C), temperature source Oral, resp. rate 18, height 5' 7.5" (1.715 m), weight 162 lb (73.5 kg), SpO2 99 %.  ECOG PERFORMANCE STATUS: 1 - Symptomatic but completely ambulatory  Physical Exam  Constitutional:  She is oriented to person, place, and time and well-developed, well-nourished, and in no distress. No distress.  HENT:  Head: Normocephalic and atraumatic.  Mouth/Throat: Oropharynx is clear and moist. No oropharyngeal exudate.  Eyes: Conjunctivae and EOM are normal. Pupils are equal, round, and reactive to light. No scleral icterus.  Neck: No thyromegaly present.  Cardiovascular: Normal rate, regular rhythm, normal heart sounds and intact distal pulses.  No murmur heard. Pulmonary/Chest:  Effort normal and breath sounds normal. No respiratory distress. She has no wheezes. She has no rales.  Abdominal: Soft. Bowel sounds are normal. She exhibits no distension. There is no tenderness. There is no rebound and no guarding.  Musculoskeletal: She exhibits no edema.  Lymphadenopathy:    She has no cervical adenopathy.  Neurological: She is alert and oriented to person, place, and time. She has normal reflexes. No cranial nerve deficit.  Skin: Skin is warm and dry. No rash noted. She is not diaphoretic. No erythema.     LABORATORY DATA: I have personally reviewed the data as listed: No visits with results within 1 Week(s) from this visit.  Latest known visit with results is:  Hospital Outpatient Visit on 02/06/2017  Component Date Value Ref Range Status  . Glucose-Capillary 02/06/2017 109* 65 - 99 mg/dL Final         Ardath Sax, MD

## 2017-02-21 ENCOUNTER — Telehealth: Payer: Self-pay | Admitting: *Deleted

## 2017-02-21 ENCOUNTER — Ambulatory Visit (HOSPITAL_COMMUNITY)
Admission: RE | Admit: 2017-02-21 | Discharge: 2017-02-21 | Disposition: A | Discharge status: Home / Self Care | Payer: Medicare Other | Source: Ambulatory Visit | Attending: Hematology and Oncology | Admitting: Hematology and Oncology

## 2017-02-21 ENCOUNTER — Other Ambulatory Visit: Payer: Self-pay | Admitting: Radiation Oncology

## 2017-02-21 ENCOUNTER — Other Ambulatory Visit: Payer: Self-pay | Admitting: Hematology and Oncology

## 2017-02-21 ENCOUNTER — Encounter (HOSPITAL_COMMUNITY): Payer: Self-pay

## 2017-02-21 DIAGNOSIS — M898X8 Other specified disorders of bone, other site: Secondary | ICD-10-CM | POA: Diagnosis not present

## 2017-02-21 DIAGNOSIS — C799 Secondary malignant neoplasm of unspecified site: Secondary | ICD-10-CM

## 2017-02-21 DIAGNOSIS — D7589 Other specified diseases of blood and blood-forming organs: Secondary | ICD-10-CM | POA: Diagnosis not present

## 2017-02-21 DIAGNOSIS — C801 Malignant (primary) neoplasm, unspecified: Secondary | ICD-10-CM | POA: Diagnosis not present

## 2017-02-21 DIAGNOSIS — D72829 Elevated white blood cell count, unspecified: Secondary | ICD-10-CM | POA: Diagnosis not present

## 2017-02-21 DIAGNOSIS — C7951 Secondary malignant neoplasm of bone: Secondary | ICD-10-CM | POA: Diagnosis not present

## 2017-02-21 HISTORY — DX: Type 2 diabetes mellitus without complications: E11.9

## 2017-02-21 HISTORY — DX: Anesthesia of skin: R20.2

## 2017-02-21 HISTORY — DX: Raynaud's syndrome without gangrene: I73.00

## 2017-02-21 HISTORY — DX: Paresthesia of skin: R20.0

## 2017-02-21 LAB — GLUCOSE, CAPILLARY: GLUCOSE-CAPILLARY: 88 mg/dL (ref 65–99)

## 2017-02-21 LAB — CBC WITH DIFFERENTIAL/PLATELET
BASOS ABS: 0 10*3/uL (ref 0.0–0.1)
Basophils Relative: 0 %
Eosinophils Absolute: 0.1 10*3/uL (ref 0.0–0.7)
Eosinophils Relative: 1 %
HEMATOCRIT: 33.9 % — AB (ref 36.0–46.0)
Hemoglobin: 9.8 g/dL — ABNORMAL LOW (ref 12.0–15.0)
Lymphocytes Relative: 11 %
Lymphs Abs: 2.1 10*3/uL (ref 0.7–4.0)
MCH: 22.8 pg — ABNORMAL LOW (ref 26.0–34.0)
MCHC: 28.9 g/dL — ABNORMAL LOW (ref 30.0–36.0)
MCV: 78.8 fL (ref 78.0–100.0)
Monocytes Absolute: 1.8 10*3/uL — ABNORMAL HIGH (ref 0.1–1.0)
Monocytes Relative: 10 %
NEUTROS ABS: 14.4 10*3/uL — AB (ref 1.7–7.7)
Neutrophils Relative %: 78 %
PLATELETS: 267 10*3/uL (ref 150–400)
RBC: 4.3 MIL/uL (ref 3.87–5.11)
RDW: 18.3 % — ABNORMAL HIGH (ref 11.5–15.5)
WBC: 18.5 10*3/uL — AB (ref 4.0–10.5)

## 2017-02-21 LAB — PROTIME-INR
INR: 0.99
Prothrombin Time: 13 seconds (ref 11.4–15.2)

## 2017-02-21 LAB — APTT: aPTT: 26 seconds (ref 24–36)

## 2017-02-21 MED ORDER — SODIUM CHLORIDE 0.9 % IV SOLN
INTRAVENOUS | Status: DC
  Administered 2017-02-21 (×2): via INTRAVENOUS

## 2017-02-21 MED ORDER — MIDAZOLAM HCL 2 MG/2ML IJ SOLN
INTRAMUSCULAR | Status: AC | PRN
  Administered 2017-02-21: 2 mg via INTRAVENOUS

## 2017-02-21 MED ORDER — MIDAZOLAM HCL 2 MG/2ML IJ SOLN
INTRAMUSCULAR | Status: AC
  Filled 2017-02-21: qty 4

## 2017-02-21 MED ORDER — FENTANYL CITRATE (PF) 100 MCG/2ML IJ SOLN
INTRAMUSCULAR | Status: AC
  Filled 2017-02-21: qty 2

## 2017-02-21 MED ORDER — FENTANYL CITRATE (PF) 100 MCG/2ML IJ SOLN
INTRAMUSCULAR | Status: AC | PRN
  Administered 2017-02-21 (×2): 50 ug via INTRAVENOUS

## 2017-02-21 NOTE — Procedures (Signed)
R iliac BM Bx R iliac bone lesion Bx EBL 0 Comp 0

## 2017-02-21 NOTE — H&P (Signed)
Referring Physician(s): Perlov,Mikhail G  Supervising Physician: Marybelle Killings  Patient Status:  [facility] OP  Chief Complaint:  "I'm having a bone marrow biopsy"  Subjective: Patient familiar to IR service from prior cerebral arteriogram in 2012.  She has a history of DCIS of the breast status post lumpectomy 2004.  She now presents with what appears to be widely metastatic malignancy of unknown primary with known sites of disease involving left lung, hilar lymphadenopathy,  liver lesions and multifocal skeletal disease.  She presents today for CT-guided bone marrow biopsy for further evaluation.  She denies fever, abdominal pain, vomiting; does have intermittent headaches, occasional chest discomfort, some dyspnea with exertion, recent blood-tinged sputum, back pain as well as nausea. Past Medical History:  Diagnosis Date  . Anemia   . Angina   . Arthritis    Rheumatoid  . Brain cancer ([place])   . Breast cancer ([place])   . Cancer ([place])    left breast, cervical cancer  . Colon polyps   . Constipation   . Diabetes mellitus without complication ([place])   . Diarrhea   . GERD (gastroesophageal reflux disease)   . Heart murmur   . Hypercholesterolemia   . Hypertension   . Lung cancer ([place])   . Numbness and tingling in right hand   . Numbness and tingling of both feet   . Peripheral vascular disease ([place])    cerebrel aneurysm /sp coil  . PONV (postoperative nausea and vomiting)   . Raynaud's syndrome   . Rheumatoid arthritis with rheumatoid factor (HCC)   . Right knee DJD   . Stroke [facility])    jan 2012   Past Surgical History:  Procedure Laterality Date  . ABDOMINAL HYSTERECTOMY  1977  . APPENDECTOMY  1964  . BILATERAL OOPHORECTOMY    . CHOLECYSTECTOMY    . COLONOSCOPY  12/09/2010   Procedure: COLONOSCOPY;  Surgeon: Rogene Houston, MD;  [facility];  Service: Endoscopy;  Laterality: N/A;  . COLONOSCOPY N/A 06/26/2015   Procedure: COLONOSCOPY;  Surgeon: Rogene Houston, MD;  [facility];  Service: Endoscopy;  Laterality: N/A;  1:05-moved to [address] to notify pt  . ESOPHAGOGASTRODUODENOSCOPY N/A 06/26/2015   Procedure: ESOPHAGOGASTRODUODENOSCOPY (EGD);  Surgeon: Rogene Houston, MD;  [facility];  Service: Endoscopy;  Laterality: N/A;  . EYE SURGERY     cataract surgery bilat   . GIVENS CAPSULE STUDY N/A 08/20/2015   Procedure: GIVENS CAPSULE STUDY;  Surgeon: Rogene Houston, MD;  [facility];  Service: Endoscopy;  Laterality: N/A;  730  . left breast lumpectomy  2004  . left knee arthroscopy    . Right brain aneurysm  04/2010   Coiled   . right wrist surgery  1999   synevectomy  . TOTAL KNEE ARTHROPLASTY Right 02/25/2013   Procedure: TOTAL KNEE ARTHROPLASTY;  Surgeon: Lorn Junes, MD;  Location: [place];  Service: Orthopedics;  Laterality: Right;     Allergies: Aurothioglucose [solganal]; Calcium channel blockers; Feldene [piroxicam]; Hydralazine; Imdur [isosorbide mononitrate]; Imuran [azathioprine]; Methotrexate derivatives; Neomycin; Nickel; Thimerosal; Percocet [oxycodone-acetaminophen]; Vioxx [rofecoxib]; Arava [leflunomide]; Asacol [mesalamine]; Hydrocodone; Iohexol; Sulfa antibiotics; Latex; and Nsaids  Medications: Prior to Admission medications   Medication Sig Start Date End Date Taking? Authorizing Provider  allopurinol (ZYLOPRIM) 300 MG tablet Take 300 mg by mouth daily.    Yes [provider]  atorvastatin (LIPITOR) 20 MG tablet Take 20 mg by mouth at bedtime.  Yes [provider]  Calcium Carbonate-Vit D-Min (CALTRATE 600+D PLUS) 600-400 MG-UNIT per tablet Chew 1 tablet by mouth 2 (two) times daily.     Yes [provider]  Cholecalciferol (VITAMIN D) 2000 UNITS tablet Take 2,000 Units by mouth daily.    Yes [provider]  DULoxetine (CYMBALTA) 60 MG capsule Take 60 mg by mouth daily.   Yes [provider]  FIBER SELECT GUMMIES PO Take 2 each by mouth  2 (two) times daily.    Yes [provider]  hydroxychloroquine (PLAQUENIL) 200 MG tablet Take 200 mg by mouth daily.    Yes [provider]  ipratropium (ATROVENT) 0.06 % nasal spray Place 2 sprays into both nostrils 2 (two) times daily as needed for rhinitis.   Yes [provider]  MAGNESIUM OXIDE PO Take 500 mg by mouth daily.    Yes [provider]  metFORMIN (GLUCOPHAGE-XR) 500 MG 24 hr tablet Take 500 mg by mouth 2 (two) times a week.   Yes [provider]  methylPREDNISolone (MEDROL) 8 MG tablet Take 8 mg by mouth daily.   Yes [provider]  morphine (MS CONTIN) 15 MG 12 hr tablet Take 1 tablet (15 mg total) by mouth every 12 (twelve) hours. 01/31/17  Yes Twana First, MD  morphine 20 MG/5ML solution Take 1 mL (4 mg total) by mouth 2 (two) times daily as needed for pain. Patient taking differently: Take 4 mg by mouth 4 (four) times daily as needed for pain (breakthrough pain).  01/26/17  Yes Brand Males, MD  nebivolol (BYSTOLIC) 5 MG tablet Take 5 mg by mouth every evening.    Yes [provider]  omeprazole (PRILOSEC) 40 MG capsule TAKE (1) CAPSULE BY MOUTH ONCE DAILY. Patient taking differently: TAKE 40 MG BY MOUTH ONCE DAILY. 07/21/14  Yes Rehman, Mechele Dawley, MD  Polyethyl Glycol-Propyl Glycol (SYSTANE) 0.4-0.3 % GEL Apply 1 application to eye daily as needed (for dry eyes).    Yes [provider]     Vital Signs:pend   Physical Exam await, alert.  Chest clear to auscultation bilaterally.  Heart with regular rate and rhythm, positive murmur.  Abdomen soft, positive bowel sounds, nontender; left greater than right lower extremity edema.  Imaging: Mr Jeri Cos VP Contrast  Result Date: 02/21/2017 CLINICAL DATA:  Breast cancer. Metastatic lung cancer. History of right posterior communicating artery aneurysm coiling. EXAM: MRI HEAD WITHOUT AND WITH CONTRAST TECHNIQUE: Multiplanar, multiecho pulse sequences of the  brain and surrounding structures were obtained without and with intravenous contrast. CONTRAST:  15 mL MultiHance IV COMPARISON:  MRI head 01/30/2017, 05/25/2011 FINDINGS: Brain: New area of restricted diffusion head of caudate on the left compatible with acute/subacute infarct. Previously noted small areas of restricted diffusion in the right occipital lobe now shows ill-defined enhancement, most consistent with subacute infarct. Small areas of restricted diffusion left parietal cortex and left posterior temporal lobe have resolved. Findings most compatible with ischemia rather than metastatic disease. Chronic microvascular ischemic changes in the white matter and pons. Small chronic infarct left frontal lobe. Negative for hemorrhage. Postcontrast imaging degraded by motion. 6 mm enhancing lesion right occipital lobe has angular margins and most compatible with subacute enhancing infarct based on prior MRI and morphology. No other worrisome enhancing lesions. Vascular: Prior coiling of right posterior communicating artery aneurysm with susceptibility. Chronic occlusion left internal carotid artery Skull and upper cervical spine: C3 vertebral body lesion has progressed in the interval compatible with metastatic  disease. No calvarial lesions. Sinuses/Orbits: Negative Other: None IMPRESSION: When compared with the prior MRI of 01/30/2017, there is improvement in the areas of restricted diffusion in the left parietal and left temporal lobe compatible with infarction. There enhancement of the small lesion in the right occipital lobe most consistent with subacute infarction. There is a new area of subacute infarct in the head of caudate on the left. Chronic occlusion left internal carotid artery with chronic ischemic changes as above. No definite metastatic disease in the brain. Continued close followup is warranted. Progression of bone marrow lesion in the C3 vertebral body compatible with metastatic disease.  Electronically Signed   By: Franchot Gallo M.D.   On: 02/21/2017 07:52    Labs:  CBC: Recent Labs    06/15/16 1611 01/25/17 1158 02/21/17 0721  WBC 20.2* 31.0* 18.5*  HGB 11.6* 11.3* 9.8*  HCT 38.1 37.3 33.9*  PLT 273 423.0* 267    COAGS: No results for input(s): INR, APTT in the last 8760 hours.  BMP: Recent Labs    06/15/16 1611 01/25/17 1158  NA 141 141  K 3.1* 3.9  CL 103 104  CO2 27 28  GLUCOSE 159* 208*  BUN 21* 18  CALCIUM 10.0 9.7  CREATININE 0.93 0.83  GFRNONAA >60  --   GFRAA >60  --     LIVER FUNCTION TESTS: No results for input(s): BILITOT, AST, ALT, ALKPHOS, PROT, ALBUMIN in the last 8760 hours.  Assessment and Plan: Pt with  history of DCIS of the breast status post lumpectomy 2004.  She now presents with what appears to be widely metastatic malignancy of unknown primary with known sites of disease involving left lung, hilar lymphadenopathy,  liver lesions and multifocal skeletal disease.  She is scheduled today for CT-guided bone marrow biopsy for further evaluation.Risks and benefits discussed with the patient/spouse including, but not limited to bleeding, infection, damage to adjacent structures or low yield requiring additional tests.All of the patient's questions were answered, patient is agreeable to proceed.Consent signed and in chart.     Electronically Signed: D. Rowe Robert, PA-C 02/21/2017, 8:23 AM   I spent a total of 25 minutes at the the patient's bedside AND on the patient's hospital floor or unit, greater than 50% of which was counseling/coordinating care for CT-guided bone marrow biopsy

## 2017-02-21 NOTE — Telephone Encounter (Signed)
"  Does Dr. Lebron Conners want a bone marrow biopsy, bone lesion biopsy or both?" Shared plan with 02-13-2017 office visit.  "We need an order for Bone lesion.  The bone marrow biopsy was ordered.  Ask on-call provider to enter order.  Patient currently in radiology.  We'll proceed at this time."  02-13-2017 plan reads.     --Priority is to establish a tissue diagnosis.  Easiest site to access are the lesions in the pelvic bones which can be easily accessed with the procedure some of the bone marrow biopsy.  Defer liver biopsy in case bone biopsy from the pelvis is nondiagnostic. --Consult interventional radiology for bone biopsy from the right side of the pelvis -- Consult orthopedic surgery for possible need for prophylactic bilateral femoral fixation due to presence of significant metastatic disease. --Return to my clinic in 1 week for pain management.  Will proceed office to notify on-call provider.

## 2017-02-21 NOTE — Discharge Instructions (Signed)
You may take off dressing and bathe in 24 hours.   Bone Marrow Aspiration and Bone Marrow Biopsy, Adult, Care After This sheet gives you information about how to care for yourself after your procedure. Your health care provider may also give you more specific instructions. If you have problems or questions, contact your health care provider. What can I expect after the procedure? After the procedure, it is common to have:  Mild pain and tenderness.  Swelling.  Bruising.  Follow these instructions at home:  Take over-the-counter or prescription medicines only as told by your health care provider.  Do not take baths, swim, or use a hot tub until your health care provider approves. Ask if you can take a shower or have a sponge bath.  Follow instructions from your health care provider about how to take care of the puncture site. Make sure you: ? Wash your hands with soap and water before you change your bandage (dressing). If soap and water are not available, use hand sanitizer. ? Change your dressing as told by your health care provider.  Check your puncture siteevery day for signs of infection. Check for: ? More redness, swelling, or pain. ? More fluid or blood. ? Warmth. ? Pus or a bad smell.  Return to your normal activities as told by your health care provider. Ask your health care provider what activities are safe for you.  Do not drive for 24 hours if you were given a medicine to help you relax (sedative).  Keep all follow-up visits as told by your health care provider. This is important. Contact a health care provider if:  You have more redness, swelling, or pain around the puncture site.  You have more fluid or blood coming from the puncture site.  Your puncture site feels warm to the touch.  You have pus or a bad smell coming from the puncture site.  You have a fever.  Your pain is not controlled with medicine. This information is not intended to replace advice  given to you by your health care provider. Make sure you discuss any questions you have with your health care provider. Document Released: 08/20/2004 Document Revised: 08/21/2015 Document Reviewed: 07/15/2015 Elsevier Interactive Patient Education  2018 [employer].    Moderate Conscious Sedation, Adult, Care After These instructions provide you with information about caring for yourself after your procedure. Your health care provider may also give you more specific instructions. Your treatment has been planned according to current medical practices, but problems sometimes occur. Call your health care provider if you have any problems or questions after your procedure. What can I expect after the procedure? After your procedure, it is common:  To feel sleepy for several hours.  To feel clumsy and have poor balance for several hours.  To have poor judgment for several hours.  To vomit if you eat too soon.  Follow these instructions at home: For at least 24 hours after the procedure:   Do not: ? Participate in activities where you could fall or become injured. ? Drive. ? Use heavy machinery. ? Drink alcohol. ? Take sleeping pills or medicines that cause drowsiness. ? Make important decisions or sign legal documents. ? Take care of children on your own.  Rest. Eating and drinking  Follow the diet recommended by your health care provider.  If you vomit: ? Drink water, juice, or soup when you can drink without vomiting. ? Make sure you have little or no nausea before eating  solid foods. General instructions  Have a responsible adult stay with you until you are awake and alert.  Take over-the-counter and prescription medicines only as told by your health care provider.  If you smoke, do not smoke without supervision.  Keep all follow-up visits as told by your health care provider. This is important. Contact a health care provider if:  You keep feeling nauseous or you  keep vomiting.  You feel light-headed.  You develop a rash.  You have a fever. Get help right away if:  You have trouble breathing. This information is not intended to replace advice given to you by your health care provider. Make sure you discuss any questions you have with your health care provider. Document Released: 11/21/2012 Document Revised: 07/06/2015 Document Reviewed: 05/23/2015 Elsevier Interactive Patient Education  [employer].

## 2017-02-21 NOTE — Telephone Encounter (Signed)
Called CT, spoke with Maudie Mercury requesting order entry routed to provider for signature.  "Enter CT Biopsy, in comments type bone lesion biopsy."  Order entered as instructed.

## 2017-02-22 ENCOUNTER — Other Ambulatory Visit (HOSPITAL_COMMUNITY): Payer: Self-pay | Admitting: Hematology and Oncology

## 2017-02-22 ENCOUNTER — Ambulatory Visit (HOSPITAL_COMMUNITY): Payer: Medicare Other | Admitting: Hematology and Oncology

## 2017-02-22 DIAGNOSIS — C799 Secondary malignant neoplasm of unspecified site: Secondary | ICD-10-CM

## 2017-02-22 MED ORDER — GADOBENATE DIMEGLUMINE 529 MG/ML IV SOLN
15.0000 mL | Freq: Once | INTRAVENOUS | Status: AC | PRN
  Administered 2017-02-20: 15 mL via INTRAVENOUS

## 2017-02-23 ENCOUNTER — Ambulatory Visit: Payer: Medicare Other | Admitting: Nutrition

## 2017-02-23 ENCOUNTER — Other Ambulatory Visit (HOSPITAL_COMMUNITY): Payer: Self-pay | Admitting: Hematology and Oncology

## 2017-02-23 DIAGNOSIS — C799 Secondary malignant neoplasm of unspecified site: Secondary | ICD-10-CM

## 2017-02-23 NOTE — Pre-Procedure Instructions (Signed)
    [name]  02/23/2017      [facility] - [place], [place] - [address] [place] [address] Phone: [phone] Fax: [phone]    Your procedure is scheduled on 02/28/17.  Report to [facility] Admitting at 11 A.M.  Call this number if you have problems the morning of surgery:  [phone]   Remember:  Do not eat food or drink liquids after midnight.  Take these medicines the morning of surgery with A SIP OF WATER ---medrol,ms contin,allopurinol,cymbalta   Do not wear jewelry, make-up or nail polish.  Do not wear lotions, powders, or perfumes, or deodorant.  Do not shave 48 hours prior to surgery.  Men may shave face and neck.  Do not bring valuables to the hospital.  [facility] is not responsible for any belongings or valuables.  Contacts, dentures or bridgework may not be worn into surgery.  Leave your suitcase in the car.  After surgery it may be brought to your room.  For patients admitted to the hospital, discharge time will be determined by your treatment team.  Patients discharged the day of surgery will not be allowed to drive home.   Name and phone number of your driver:    Special instructions:  Do not take any aspirin,anti-inflammatories,vitamins,or herbal supplements 5-7 days prior to surgery.  Please read over the following fact sheets that you were given. MRSA Information

## 2017-02-24 ENCOUNTER — Encounter (HOSPITAL_COMMUNITY)
Admission: RE | Admit: 2017-02-24 | Discharge: 2017-02-24 | Disposition: A | Discharge status: Home / Self Care | Payer: Medicare Other | Source: Ambulatory Visit | Attending: Orthopedic Surgery | Admitting: Orthopedic Surgery

## 2017-02-24 ENCOUNTER — Other Ambulatory Visit: Payer: Medicare Other

## 2017-02-24 ENCOUNTER — Encounter (HOSPITAL_COMMUNITY): Payer: Self-pay | Admitting: Emergency Medicine

## 2017-02-24 ENCOUNTER — Telehealth: Payer: Self-pay

## 2017-02-24 ENCOUNTER — Other Ambulatory Visit: Payer: Self-pay | Admitting: Hematology and Oncology

## 2017-02-24 ENCOUNTER — Other Ambulatory Visit: Payer: Self-pay

## 2017-02-24 ENCOUNTER — Encounter (HOSPITAL_COMMUNITY): Payer: Self-pay

## 2017-02-24 ENCOUNTER — Ambulatory Visit (HOSPITAL_COMMUNITY)
Admission: RE | Admit: 2017-02-24 | Discharge: 2017-02-24 | Disposition: A | Discharge status: Home / Self Care | Payer: Medicare Other | Source: Ambulatory Visit | Attending: Hematology and Oncology | Admitting: Hematology and Oncology

## 2017-02-24 DIAGNOSIS — I872 Venous insufficiency (chronic) (peripheral): Secondary | ICD-10-CM

## 2017-02-24 DIAGNOSIS — I517 Cardiomegaly: Secondary | ICD-10-CM | POA: Insufficient documentation

## 2017-02-24 DIAGNOSIS — Z86718 Personal history of other venous thrombosis and embolism: Secondary | ICD-10-CM | POA: Diagnosis not present

## 2017-02-24 DIAGNOSIS — Z01812 Encounter for preprocedural laboratory examination: Secondary | ICD-10-CM | POA: Diagnosis not present

## 2017-02-24 DIAGNOSIS — Z01810 Encounter for preprocedural cardiovascular examination: Secondary | ICD-10-CM | POA: Insufficient documentation

## 2017-02-24 DIAGNOSIS — I82402 Acute embolism and thrombosis of unspecified deep veins of left lower extremity: Secondary | ICD-10-CM

## 2017-02-24 DIAGNOSIS — Z01818 Encounter for other preprocedural examination: Secondary | ICD-10-CM | POA: Diagnosis not present

## 2017-02-24 HISTORY — DX: Headache: R51

## 2017-02-24 HISTORY — DX: Pneumonia, unspecified organism: J18.9

## 2017-02-24 HISTORY — DX: Personal history of urinary calculi: Z87.442

## 2017-02-24 HISTORY — DX: Cardiac arrhythmia, unspecified: I49.9

## 2017-02-24 HISTORY — DX: Headache, unspecified: R51.9

## 2017-02-24 LAB — BASIC METABOLIC PANEL
Anion gap: 11 (ref 5–15)
BUN: 11 mg/dL (ref 6–20)
CALCIUM: 9.5 mg/dL (ref 8.9–10.3)
CO2: 26 mmol/L (ref 22–32)
Chloride: 103 mmol/L (ref 101–111)
Creatinine, Ser: 0.75 mg/dL (ref 0.44–1.00)
Glucose, Bld: 158 mg/dL — ABNORMAL HIGH (ref 65–99)
POTASSIUM: 3 mmol/L — AB (ref 3.5–5.1)
SODIUM: 140 mmol/L (ref 135–145)

## 2017-02-24 LAB — CBC
HCT: 36.2 % (ref 36.0–46.0)
HEMOGLOBIN: 10.1 g/dL — AB (ref 12.0–15.0)
MCH: 22.1 pg — ABNORMAL LOW (ref 26.0–34.0)
MCHC: 27.9 g/dL — AB (ref 30.0–36.0)
MCV: 79.4 fL (ref 78.0–100.0)
Platelets: 288 10*3/uL (ref 150–400)
RBC: 4.56 MIL/uL (ref 3.87–5.11)
RDW: 18.4 % — ABNORMAL HIGH (ref 11.5–15.5)
WBC: 19.7 10*3/uL — AB (ref 4.0–10.5)

## 2017-02-24 LAB — HEMOGLOBIN A1C
HEMOGLOBIN A1C: 6.9 % — AB (ref 4.8–5.6)
MEAN PLASMA GLUCOSE: 151.33 mg/dL

## 2017-02-24 LAB — GLUCOSE, CAPILLARY: GLUCOSE-CAPILLARY: 120 mg/dL — AB (ref 65–99)

## 2017-02-24 LAB — SURGICAL PCR SCREEN
MRSA, PCR: NEGATIVE
STAPHYLOCOCCUS AUREUS: NEGATIVE

## 2017-02-24 MED ORDER — ENOXAPARIN SODIUM 30 MG/0.3ML ~~LOC~~ SOLN: 60.0000 mg | Freq: Two times a day (BID) | SUBCUTANEOUS | 0 refills | Status: DC

## 2017-02-24 MED ORDER — RIVAROXABAN (XARELTO) VTE STARTER PACK (15 & 20 MG): ORAL_TABLET | ORAL | 0 refills | Status: DC

## 2017-02-24 NOTE — Telephone Encounter (Signed)
Call received from [facility], [facility] that lower left extremity venous duplex has been completed and there is evidence of a DVT. Dr. Lebron Conners made aware and sent in prescriptions to patient's pharmacy. Spoke to [facility], who will make sure patient receives instructions on how to administer Lovenox injections.

## 2017-02-24 NOTE — Progress Notes (Addendum)
PCP is Dr Jani Gravel Cardiologist is Dr Carollee Massed   Dr. Lebron Conners is oncology  Denies chest pain, fever, reports a productive cough with bloody sputum at times. Reports fasting CBG's run 60-140's Echo noted 2012 States she had a stress many years ago. Denies ever having a card cath. C/o pain behind her left knee with swelling  And redness noted to lower leg.  States she has had a blood clot in that leg before.  Willeen Cass, FNP called to assess.

## 2017-02-24 NOTE — Progress Notes (Signed)
Left lower extremity venous duplex has been completed. There is evidence of acute deep vein thrombosis involving the femoral, popliteal, intramuscular gastrocnemius, posterior tibial, and peroneal veins of the left lower extremity. Results were given to [facility] at Dr. Clydene Laming office.  02/24/17 3:40 PM [name] [facility]

## 2017-02-24 NOTE — Pre-Procedure Instructions (Signed)
DAYTON SHERR  02/24/2017      [facility] - [place], [place] - [address] [place] [address] Phone: [phone] Fax: [phone]    Your procedure is scheduled on 02/28/17.  Report to [facility] Admitting at 11 A.M.  Call this number if you have problems the morning of surgery:  [phone]   Remember:  Do not eat food or drink liquids after midnight.  Take these medicines the morning of surgery with A SIP OF WATER allopurinol (Zyloprim), Duloxetine (Cymbalta), Plaquenil, Atrovent inhaler (bring your inhalers with you on the day of surgery), Medrol, Morphine (MS Contin), Morphine if needed, Omeprazole (Prilosec)  Stop taking aspirin, BC', Goody's, Herbal medications, Fish Oil, Aleve, Ibuprofen, Advil, Motrin    How to Manage Your Diabetes Before and After Surgery  Why is it important to control my blood sugar before and after surgery? . Improving blood sugar levels before and after surgery helps healing and can limit problems. . A way of improving blood sugar control is eating a healthy diet by: o  Eating less sugar and carbohydrates o  Increasing activity/exercise o  Talking with your doctor about reaching your blood sugar goals . High blood sugars (greater than 180 mg/dL) can raise your risk of infections and slow your recovery, so you will need to focus on controlling your diabetes during the weeks before surgery. . Make sure that the doctor who takes care of your diabetes knows about your planned surgery including the date and location.  How do I manage my blood sugar before surgery? . Check your blood sugar at least 4 times a day, starting 2 days before surgery, to make sure that the level is not too high or low. o Check your blood sugar the morning of your surgery when you wake up and every 2 hours until you get to the Short Stay unit. . If your blood sugar is less than 70 mg/dL, you will need to treat for low  blood sugar: o Do not take insulin. o Treat a low blood sugar (less than 70 mg/dL) with  cup of clear juice (cranberry or apple), 4 glucose tablets, OR glucose gel. Recheck blood sugar in 15 minutes after treatment (to make sure it is greater than 70 mg/dL). If your blood sugar is not greater than 70 mg/dL on recheck, call [phone] o  for further instructions. . Report your blood sugar to the short stay nurse when you get to Short Stay.  . If you are admitted to the hospital after surgery: o Your blood sugar will be checked by the staff and you will probably be given insulin after surgery (instead of oral diabetes medicines) to make sure you have good blood sugar levels. o The goal for blood sugar control after surgery is 80-180 mg/dL.              WHAT DO I DO ABOUT MY DIABETES MEDICATION?   Marland Kitchen Do not take oral diabetes medicines (pills) the morning of surgery.Metformin (Glucophage XR)      . The day of surgery, do not take other diabetes injectables, including Byetta (exenatide), Bydureon (exenatide ER), Victoza (liraglutide), or Trulicity (dulaglutide).  . If your CBG is greater than 220 mg/dL, you may take  of your sliding scale (correction) dose of insulin.  Other Instructions:          Patient Signature:  Date:   Nurse Signature:  Date:   Reviewed  and Endorsed by [facility] Patient Education Committee, August 2015  Do not wear jewelry, make-up or nail polish.  Do not wear lotions, powders, or perfumes, or deodorant.  Do not shave 48 hours prior to surgery.  Men may shave face and neck.  Do not bring valuables to the hospital.  [facility] is not responsible for any belongings or valuables.  Contacts, dentures or bridgework may not be worn into surgery.  Leave your suitcase in the car.  After surgery it may be brought to your room.  For patients admitted to the hospital, discharge time will be determined by your treatment team.  Patients discharged the  day of surgery will not be allowed to drive home.   Name and phone number of your driver:    Special instructions:  Do not take any aspirin,anti-inflammatories,vitamins,or herbal supplements 5-7 days prior to surgery.  Please read over the following fact sheets that you were given. MRSA Information

## 2017-02-27 ENCOUNTER — Inpatient Hospital Stay (HOSPITAL_COMMUNITY): Payer: Medicare Other | Attending: Hematology and Oncology | Admitting: Hematology and Oncology

## 2017-02-27 ENCOUNTER — Encounter (HOSPITAL_COMMUNITY): Payer: Self-pay | Admitting: Hematology and Oncology

## 2017-02-27 ENCOUNTER — Other Ambulatory Visit (HOSPITAL_COMMUNITY): Payer: Self-pay | Admitting: Hematology and Oncology

## 2017-02-27 ENCOUNTER — Ambulatory Visit (HOSPITAL_BASED_OUTPATIENT_CLINIC_OR_DEPARTMENT_OTHER)
Admission: RE | Admit: 2017-02-27 | Discharge: 2017-02-27 | Disposition: A | Discharge status: Home / Self Care | Payer: Medicare Other | Source: Ambulatory Visit | Attending: Radiation Oncology | Admitting: Radiation Oncology

## 2017-02-27 ENCOUNTER — Inpatient Hospital Stay: Payer: Medicare Other | Attending: Internal Medicine | Admitting: Internal Medicine

## 2017-02-27 ENCOUNTER — Encounter: Payer: Self-pay | Admitting: Radiation Oncology

## 2017-02-27 ENCOUNTER — Other Ambulatory Visit: Payer: Self-pay

## 2017-02-27 ENCOUNTER — Ambulatory Visit
Admission: RE | Admit: 2017-02-27 | Discharge: 2017-02-27 | Disposition: A | Discharge status: Home / Self Care | Payer: Medicare Other | Source: Ambulatory Visit | Attending: Radiation Oncology | Admitting: Radiation Oncology

## 2017-02-27 ENCOUNTER — Encounter: Payer: Self-pay | Admitting: Internal Medicine

## 2017-02-27 VITALS — BP 152/52 | HR 80 | Temp 98.2°F | Resp 16 | Ht 67.5 in | Wt 163.0 lb

## 2017-02-27 DIAGNOSIS — Z66 Do not resuscitate: Secondary | ICD-10-CM | POA: Diagnosis not present

## 2017-02-27 DIAGNOSIS — R110 Nausea: Secondary | ICD-10-CM | POA: Diagnosis not present

## 2017-02-27 DIAGNOSIS — Z7901 Long term (current) use of anticoagulants: Secondary | ICD-10-CM

## 2017-02-27 DIAGNOSIS — R42 Dizziness and giddiness: Secondary | ICD-10-CM | POA: Diagnosis not present

## 2017-02-27 DIAGNOSIS — Z86000 Personal history of in-situ neoplasm of breast: Secondary | ICD-10-CM | POA: Insufficient documentation

## 2017-02-27 DIAGNOSIS — R6883 Chills (without fever): Secondary | ICD-10-CM | POA: Insufficient documentation

## 2017-02-27 DIAGNOSIS — R042 Hemoptysis: Secondary | ICD-10-CM | POA: Insufficient documentation

## 2017-02-27 DIAGNOSIS — Z8673 Personal history of transient ischemic attack (TIA), and cerebral infarction without residual deficits: Secondary | ICD-10-CM | POA: Diagnosis not present

## 2017-02-27 DIAGNOSIS — M069 Rheumatoid arthritis, unspecified: Secondary | ICD-10-CM | POA: Diagnosis not present

## 2017-02-27 DIAGNOSIS — G893 Neoplasm related pain (acute) (chronic): Secondary | ICD-10-CM | POA: Insufficient documentation

## 2017-02-27 DIAGNOSIS — Z87442 Personal history of urinary calculi: Secondary | ICD-10-CM

## 2017-02-27 DIAGNOSIS — Z510 Encounter for antineoplastic radiation therapy: Secondary | ICD-10-CM | POA: Diagnosis not present

## 2017-02-27 DIAGNOSIS — Z515 Encounter for palliative care: Secondary | ICD-10-CM | POA: Diagnosis not present

## 2017-02-27 DIAGNOSIS — I63139 Cerebral infarction due to embolism of unspecified carotid artery: Secondary | ICD-10-CM | POA: Diagnosis not present

## 2017-02-27 DIAGNOSIS — K219 Gastro-esophageal reflux disease without esophagitis: Secondary | ICD-10-CM | POA: Diagnosis not present

## 2017-02-27 DIAGNOSIS — C3412 Malignant neoplasm of upper lobe, left bronchus or lung: Secondary | ICD-10-CM

## 2017-02-27 DIAGNOSIS — E7800 Pure hypercholesterolemia, unspecified: Secondary | ICD-10-CM

## 2017-02-27 DIAGNOSIS — I82402 Acute embolism and thrombosis of unspecified deep veins of left lower extremity: Secondary | ICD-10-CM

## 2017-02-27 DIAGNOSIS — I10 Essential (primary) hypertension: Secondary | ICD-10-CM | POA: Diagnosis not present

## 2017-02-27 DIAGNOSIS — R5383 Other fatigue: Secondary | ICD-10-CM

## 2017-02-27 DIAGNOSIS — Z86718 Personal history of other venous thrombosis and embolism: Secondary | ICD-10-CM | POA: Diagnosis not present

## 2017-02-27 DIAGNOSIS — R9402 Abnormal brain scan: Secondary | ICD-10-CM

## 2017-02-27 DIAGNOSIS — R531 Weakness: Secondary | ICD-10-CM | POA: Diagnosis not present

## 2017-02-27 DIAGNOSIS — C799 Secondary malignant neoplasm of unspecified site: Secondary | ICD-10-CM

## 2017-02-27 DIAGNOSIS — C7931 Secondary malignant neoplasm of brain: Secondary | ICD-10-CM | POA: Diagnosis not present

## 2017-02-27 DIAGNOSIS — M549 Dorsalgia, unspecified: Secondary | ICD-10-CM

## 2017-02-27 DIAGNOSIS — Z803 Family history of malignant neoplasm of breast: Secondary | ICD-10-CM | POA: Insufficient documentation

## 2017-02-27 DIAGNOSIS — Z79899 Other long term (current) drug therapy: Secondary | ICD-10-CM | POA: Diagnosis not present

## 2017-02-27 DIAGNOSIS — C7951 Secondary malignant neoplasm of bone: Secondary | ICD-10-CM | POA: Insufficient documentation

## 2017-02-27 DIAGNOSIS — Z7984 Long term (current) use of oral hypoglycemic drugs: Secondary | ICD-10-CM | POA: Insufficient documentation

## 2017-02-27 DIAGNOSIS — E119 Type 2 diabetes mellitus without complications: Secondary | ICD-10-CM | POA: Diagnosis not present

## 2017-02-27 DIAGNOSIS — Z882 Allergy status to sulfonamides status: Secondary | ICD-10-CM | POA: Diagnosis not present

## 2017-02-27 DIAGNOSIS — Z8614 Personal history of Methicillin resistant Staphylococcus aureus infection: Secondary | ICD-10-CM | POA: Insufficient documentation

## 2017-02-27 DIAGNOSIS — Z87891 Personal history of nicotine dependence: Secondary | ICD-10-CM | POA: Insufficient documentation

## 2017-02-27 DIAGNOSIS — C7949 Secondary malignant neoplasm of other parts of nervous system: Secondary | ICD-10-CM | POA: Diagnosis not present

## 2017-02-27 DIAGNOSIS — I639 Cerebral infarction, unspecified: Secondary | ICD-10-CM

## 2017-02-27 DIAGNOSIS — C7952 Secondary malignant neoplasm of bone marrow: Secondary | ICD-10-CM | POA: Diagnosis not present

## 2017-02-27 DIAGNOSIS — I7300 Raynaud's syndrome without gangrene: Secondary | ICD-10-CM | POA: Insufficient documentation

## 2017-02-27 DIAGNOSIS — Z8051 Family history of malignant neoplasm of kidney: Secondary | ICD-10-CM | POA: Insufficient documentation

## 2017-02-27 MED ORDER — FOLIC ACID 1 MG PO TABS: 1.0000 mg | ORAL_TABLET | Freq: Every day | ORAL | 3 refills | Status: DC

## 2017-02-27 MED ORDER — PROCHLORPERAZINE MALEATE 10 MG PO TABS: 10.0000 mg | ORAL_TABLET | Freq: Four times a day (QID) | ORAL | 1 refills | Status: DC | PRN

## 2017-02-27 MED ORDER — DEXAMETHASONE 4 MG PO TABS: ORAL_TABLET | ORAL | 1 refills | Status: DC

## 2017-02-27 MED ORDER — MORPHINE SULFATE ER 30 MG PO TBCR: 30.0000 mg | EXTENDED_RELEASE_TABLET | Freq: Two times a day (BID) | ORAL | 0 refills | Status: DC

## 2017-02-27 MED ORDER — ENOXAPARIN SODIUM 30 MG/0.3ML ~~LOC~~ SOLN: 60.0000 mg | Freq: Two times a day (BID) | SUBCUTANEOUS | 0 refills | Status: DC

## 2017-02-27 MED ORDER — LORAZEPAM 0.5 MG PO TABS: 0.5000 mg | ORAL_TABLET | Freq: Four times a day (QID) | ORAL | 0 refills | Status: DC | PRN

## 2017-02-27 MED ORDER — LIDOCAINE-PRILOCAINE 2.5-2.5 % EX CREA: TOPICAL_CREAM | CUTANEOUS | 3 refills | Status: DC

## 2017-02-27 MED ORDER — ONDANSETRON HCL 8 MG PO TABS: 8.0000 mg | ORAL_TABLET | Freq: Two times a day (BID) | ORAL | 1 refills | Status: DC | PRN

## 2017-02-27 NOTE — Progress Notes (Signed)
Anesthesia consult:  Pt is a 67 year old female scheduled for bilateral intertrochantric nail on 02/28/2017 with Edmonia Lynch, MD  - PCP is Jani Gravel, MD - Oncologist is Grace Isaac, MD - Cardiologist is Kela Millin, MD  I was called to see pt in pre-admission testing due to c/o possible DVT.  LLE significantly edematous. Pt c/o pain behind L knee.  Pt reports symptoms feel similar to when she had a LLE DVT a year ago.  I contacted Dr. Lebron Conners who ordered a LLE doppler US which is positive for DVT (see [place] report below).  Dr. Lebron Conners rx lovenox for 6 doses, last dose morning of 02/27/17.  Dr. Lebron Conners also rx xarelto for pt to initiate after surgery.    PMH includes:  Metastatic cancer of unknown primary, stroke, dysrhythmia, HTN, heart murmur, cerebral aneurysm (s/p coil), DVT (01/2016 and 02/24/17), DM, hyperlipidemia, anemia, RA, Raynaud's, post-op N/V, GERD. Former smoker. BMI 25. S/p R TKA 02/25/13.  Medications include: Lipitor, lovenox, Plaquenil, metformin, methylprednisolone, nebivolol, Prilosec.  Last dose lovenox 02/27/17 in the morning. Pt to start xarelto post-op for DVT.   BP (!) 110/48 Comment: taken manually  Pulse 61   Temp 36.9 C   Resp 18   Ht 5' 7.5" (1.715 m)   Wt 161 lb 9.6 oz (73.3 kg)   SpO2 100%   BMI 24.94 kg/m   Preoperative labs reviewed.   - HbA1c 6.9, glucose 158 - WBC 19.7. This is consistent with prior results dating back to 06/15/16.   EKG 02/24/17: NSR with sinus arrhythmia. LVH - new since previous tracing  LE doppler 02/24/17:  - Right: No evidence of common femoral vein obstruction. - Left: There is evidence of acute DVT in the Femoral vein, Popliteal vein, Posterior Tibial vein, Peroneal vein, and Gastrocnemius vein. There is no evidence of superficial venous thrombosis. No cystic structure found in the popliteal fossa.  CT chest 01/25/17:  1. Multiple findings in the left hemithorax suspicious for malignancy, including multiple small left pleural  nodules with nodularity of the left major fissure, 2.1 cm left apical lesion, lymphadenopathy /central mass lesion in the superior left hilum generating mass-effect on the airway to the superior segment left lower lobe, and 13 mm left lower lobe parenchymal nodule. Given the history of breast cancer, these findings may be related to metastatic disease or interval development of primary bronchogenic neoplasm in the left lung with associated metastatic disease. 2. New lesions in the dome of the liver are highly suspicious for metastatic involvement. Dedicated abdomen and pelvis CT with oral and intravenous contrast would likely prove helpful to further evaluate. 3. Multiple apparent bone metastases, as above.  Willeen Cass, FNP-BC [facility] Short Stay Surgical Center/Anesthesiology Phone: [phone] 02/27/2017 2:12 PM

## 2017-02-27 NOTE — Progress Notes (Signed)
START ON PATHWAY REGIMEN - Non-Small Cell Lung     A cycle is every 21 days:     Pembrolizumab      Pemetrexed      Carboplatin   **Always confirm dose/schedule in your pharmacy ordering system**    Patient Characteristics: Stage IV Metastatic, Nonsquamous, Initial Chemotherapy/Immunotherapy, PS = 2, PD-L1 Expression Positive 1-49% (TPS) / Negative / Not Tested / Awaiting Test Results AJCC T Category: TX Current Disease Status: Distant Metastases AJCC N Category: NX AJCC M Category: M1c AJCC 8 Stage Grouping: IVB Histology: Nonsquamous Cell ROS1 Rearrangement Status: Awaiting Test Results T790M Mutation Status: Not Applicable - EGFR Mutation Negative/Unknown Other Mutations/Biomarkers: No Other Actionable Mutations PD-L1 Expression Status: Awaiting Test Results Chemotherapy/Immunotherapy LOT: Initial Chemotherapy/Immunotherapy Molecular Targeted Therapy: Not Appropriate ALK Translocation Status: Awaiting Test Results Would you be surprised if this patient died  in the next year<= I would NOT be surprised if this patient died in the next year EGFR Mutation Status: Awaiting Test Results BRAF V600E Mutation Status: Awaiting Test Results Performance Status: PS = 2 Intent of Therapy: Non-Curative / Palliative Intent, Discussed with Patient

## 2017-02-27 NOTE — Progress Notes (Signed)
[facility], [place] [address] [phone]   New Patient Evaluation  Date of Service: 02/27/17 Patient Name: [name] Patient MRN: [id number] Patient DOB: Oct 07, 1950 Provider: Ventura Sellers, MD  Identifying Statement:  [name] is a 67 y.o. female with Cerebrovascular accident (CVA), unspecified mechanism ([place]) [I63.9] who presents for initial consultation and evaluation regarding cancer associated neurologic deficits.    Referring Provider: Jani Gravel, MD [address] [place], [place] [address]  Primary Cancer: stage IV non small cell lung cancer of the left upper lobe  History of Present Illness: The patient's records from the referring physician were obtained and reviewed and the patient interviewed to confirm this HPI.  [name] presents today for stroke evaluation after characteristic imaging findings.  In presence of this newly diagnosed metastatic (likely lung) cancer, screening brain MRI was performed on 01/30/17 showing several lesions with restricted diffusion.  Repeat MRI was performed on 02/20/17 which demonstrated an enhancing lesion and several DWI positive lesions consistent with multi-focal infarcts over metastases.  She has a history notable for PCOM aneurysm s/p coiling (2010?) and recurrent TIA with known left carotid artery stenosis.  She has an orthopedic procedure scheduled tomorrow to prevent pathologic fracture in the leg.  Additionally, proximal DVT was identified after left leg pain/swelling this past week, she started on Lovenox this weekend.  Medications: Current Outpatient Medications on File Prior to Visit  Medication Sig Dispense Refill  . allopurinol (ZYLOPRIM) 300 MG tablet Take 300 mg by mouth daily.     Marland Kitchen atorvastatin (LIPITOR) 20 MG tablet Take 20 mg by mouth at bedtime.    . Calcium Carbonate-Vit D-Min (CALTRATE 600+D PLUS) 600-400 MG-UNIT per tablet Chew  1 tablet by mouth 2 (two) times daily.      . Cholecalciferol (VITAMIN D) 2000 UNITS tablet Take 2,000 Units by mouth daily.     . DULoxetine (CYMBALTA) 60 MG capsule Take 60 mg by mouth daily.    Marland Kitchen FIBER SELECT GUMMIES PO Take 2 each by mouth 2 (two) times daily.     . hydroxychloroquine (PLAQUENIL) 200 MG tablet Take 200 mg by mouth daily.     Marland Kitchen ipratropium (ATROVENT) 0.06 % nasal spray Place 2 sprays into both nostrils 2 (two) times daily as needed for rhinitis.    Marland Kitchen MAGNESIUM OXIDE PO Take 500 mg by mouth daily.     . metFORMIN (GLUCOPHAGE-XR) 500 MG 24 hr tablet Take 500 mg by mouth 2 (two) times a week.    . methylPREDNISolone (MEDROL) 8 MG tablet Take 8 mg by mouth daily.    Marland Kitchen morphine (MS CONTIN) 15 MG 12 hr tablet Take 1 tablet (15 mg total) by mouth every 12 (twelve) hours. 60 tablet 0  . morphine 20 MG/5ML solution Take 1 mL (4 mg total) by mouth 2 (two) times daily as needed for pain. (Patient taking differently: Take 4 mg by mouth 4 (four) times daily as needed for pain (breakthrough pain). ) 60 mL 0  . nebivolol (BYSTOLIC) 5 MG tablet Take 5 mg by mouth every evening.     Marland Kitchen omeprazole (PRILOSEC) 40 MG capsule TAKE (1) CAPSULE BY MOUTH ONCE DAILY. (Patient taking differently: TAKE 40 MG BY MOUTH ONCE DAILY.) 30 capsule 11  . Polyethyl Glycol-Propyl Glycol (SYSTANE) 0.4-0.3 % GEL Apply 1 application to eye daily as needed (for dry eyes).     . Rivaroxaban 15 &  20 MG TBPK Take as directed on package: Start with one 15mg  tablet by mouth twice a day with food. On Day 22, switch to one 20mg  tablet once a day with food. 51 each 0  . enoxaparin (LOVENOX) 30 MG/0.3ML injection Inject 0.6 mLs (60 mg total) into the skin every 12 (twelve) hours for 6 doses. (Patient not taking: Reported on 02/27/2017) 3.6 mL 0   No current facility-administered medications on file prior to visit.     Allergies:  Allergies  Allergen Reactions  . Aurothioglucose [Solganal] Other (See Comments)    REACTION:  VASCULITIS   . Calcium Channel Blockers Palpitations  . Feldene [Piroxicam] Anaphylaxis and Other (See Comments)    SKIN BLISTERS  . Hydralazine Other (See Comments)    REACTION: Chest pains and headaches: IV only  . Imdur [Isosorbide Mononitrate] Other (See Comments)    REACTION: PATIENT IS UNABLE TO WALK OR FUNCTION  . Imuran [Azathioprine] Other (See Comments)    Could not walk or function  . Methotrexate Derivatives Other (See Comments)    REACTION: VASCULITIS  . Neomycin Rash  . Nickel Rash  . Thimerosal Other (See Comments)    SKIN BLISTERED  . Percocet [Oxycodone-Acetaminophen] Other (See Comments)    REACTION : INSOMNIA  . Vioxx [Rofecoxib] Other (See Comments)    REACTION: CAUSED MORE JOINT PAIN  . Arava [Leflunomide] Other (See Comments)    REACTION: UNKNOWN  . Asacol [Mesalamine] Other (See Comments)    REACTION: UNKNOWN  . Hydrocodone Itching  . Iohexol Other (See Comments)     Desc: PT STATES SHE WAS TOLD NEVER TO HAVE CT CONTRAST AGAIN.  SHE HAD SOME KIND OF REACTION BUT DOESNT REMEBER WHAT HAPPENED   . Sulfa Antibiotics Diarrhea and Nausea And Vomiting  . Latex Rash  . Nsaids Other (See Comments)    GI UPSET , CAN TOLERATE SOME NSAIDS   Past Medical History:  Past Medical History:  Diagnosis Date  . Anemia   . Angina   . Arthritis    Rheumatoid  . Brain cancer ([place])   . Breast cancer ([place])   . Cancer ([place])    left breast, cervical cancer  . Colon polyps   . Constipation   . Diabetes mellitus without complication ([place])   . Diarrhea   . Dysrhythmia   . GERD (gastroesophageal reflux disease)   . Headache   . Heart murmur   . History of kidney stones   . Hypercholesterolemia   . Hypertension   . Lung cancer ([place])   . Numbness and tingling in right hand   . Numbness and tingling of both feet   . Peripheral vascular disease ([place])    cerebrel aneurysm /sp coil  . Pneumonia   . PONV (postoperative nausea and vomiting)   . Raynaud's syndrome   .  Rheumatoid arthritis with rheumatoid factor ([facility])   . Right knee DJD   . Stroke [facility])    jan 2012   Past Surgical History:  Past Surgical History:  Procedure Laterality Date  . ABDOMINAL HYSTERECTOMY  1977  . APPENDECTOMY  1964  . BILATERAL OOPHORECTOMY    . CHOLECYSTECTOMY    . COLONOSCOPY  12/09/2010   Procedure: COLONOSCOPY;  Surgeon: Rogene Houston, MD;  [facility];  Service: Endoscopy;  Laterality: N/A;  . COLONOSCOPY N/A 06/26/2015   Procedure: COLONOSCOPY;  Surgeon: Rogene Houston, MD;  [facility];  Service: Endoscopy;  Laterality: N/A;  1:05-moved to 215  Ann to notify pt  . ESOPHAGOGASTRODUODENOSCOPY N/A 06/26/2015   Procedure: ESOPHAGOGASTRODUODENOSCOPY (EGD);  Surgeon: Rogene Houston, MD;  [facility];  Service: Endoscopy;  Laterality: N/A;  . EYE SURGERY     cataract surgery bilat   . GIVENS CAPSULE STUDY N/A 08/20/2015   Procedure: GIVENS CAPSULE STUDY;  Surgeon: Rogene Houston, MD;  [facility];  Service: Endoscopy;  Laterality: N/A;  730  . left breast lumpectomy  2004  . left knee arthroscopy    . Right brain aneurysm  04/2010   Coiled   . right wrist surgery  1999   synevectomy  . TOTAL KNEE ARTHROPLASTY Right 02/25/2013   Procedure: TOTAL KNEE ARTHROPLASTY;  Surgeon: Lorn Junes, MD;  Location: [facility];  Service: Orthopedics;  Laterality: Right;   Social History:  Social History   Socioeconomic History  . Marital status: Married    Spouse name: Hedy Camara  . Number of children: o  . Years of education: 69  . Highest education level: Not on file  Social Needs  . Financial resource strain: Not on file  . Food insecurity - worry: Not on file  . Food insecurity - inability: Not on file  . Transportation needs - medical: Not on file  . Transportation needs - non-medical: Not on file  Occupational History  . 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  Smoking status: Former Smoker    Packs/day: 2.00    Years: 15.00    Pack years: 30.00    Last attempt to quit: 02/20/1984    Years since quitting: 33.0  . Smokeless tobacco: Never Used  . Tobacco comment: Quit 1986  Substance and Sexual Activity  . Alcohol use: No  . Drug use: No  . Sexual activity: Not Currently  Other Topics Concern  . Not on file  Social History Narrative   Employed with [employer]. Retired January 2017. Married with no children. Quit smoking 1986. No alcohol or illicit drug use.    Right handed    Some college.   Caffeine one cup of tea daily.   Family History:  Family History  Problem Relation Age of Onset  . Hypertension Father   . Heart attack Father   . Heart disease Maternal Uncle   . Cancer Maternal Aunt        breast  . Cancer Other        kidney  . Cancer Cousin   . Colon cancer Neg Hx     Review of Systems: Constitutional: Denies fevers, chills or abnormal weight loss Eyes: Denies blurriness of vision Ears, nose, mouth, throat, and face: Denies mucositis or sore throat Respiratory: Denies cough, dyspnea or wheezes Cardiovascular: Denies palpitation, chest discomfort or lower extremity swelling Gastrointestinal:  Denies nausea, constipation, diarrhea GU: Denies dysuria or incontinence Skin: Denies abnormal skin rashes Neurological: Per HPI Musculoskeletal: Denies joint pain, back or neck discomfort. No decrease in ROM Behavioral/Psych: Denies anxiety, disturbance in thought content, and mood instability   Physical Exam: There were no vitals filed for this visit. KPS: 70. General: Alert, cooperative, pleasant, in no acute distress Head: Craniotomy scar noted, dry and intact. EENT: No conjunctival injection or scleral icterus. Oral mucosa moist Lungs: Resp effort normal Cardiac: Regular rate and rhythm Abdomen: Soft, non-distended abdomen Skin: No rashes cyanosis or petechiae. Extremities: No clubbing or  edema  Neurologic Exam:  Mental Status: Awake, alert, attentive to examiner. Oriented to self and environment. Language is fluent with intact comprehension.  Cranial Nerves: Visual acuity is grossly normal. Visual fields are full. Extra-ocular movements intact. No ptosis. Face is symmetric, tongue midline. Motor: Tone and bulk are normal. Power is full in both arms and legs. Reflexes are symmetric, no pathologic reflexes present. Intact finger to nose bilaterally Sensory: Intact to light touch and temperature Gait: Normal and tandem gait is normal.   Labs: I have reviewed the data as listed    Component Value Date/Time   NA 140 02/24/2017 1430   K 3.0 (L) 02/24/2017 1430   CL 103 02/24/2017 1430   CO2 26 02/24/2017 1430   GLUCOSE 158 (H) 02/24/2017 1430   BUN 11 02/24/2017 1430   CREATININE 0.75 02/24/2017 1430   CREATININE 1.00 (H) 02/04/2016 1440   CALCIUM 9.5 02/24/2017 1430   PROT 6.1 (L) 02/09/2016 2009   ALBUMIN 3.4 (L) 02/09/2016 2009   AST 32 02/09/2016 2009   ALT 49 02/09/2016 2009   ALKPHOS 91 02/09/2016 2009   BILITOT 0.6 02/09/2016 2009   GFRNONAA >60 02/24/2017 1430   GFRAA >60 02/24/2017 1430   Lab Results  Component Value Date   WBC 19.7 (H) 02/24/2017   NEUTROABS 14.4 (H) 02/21/2017   HGB 10.1 (L) 02/24/2017   HCT 36.2 02/24/2017   MCV 79.4 02/24/2017   PLT 288 02/24/2017    Imaging:  CLINICAL DATA:  Breast cancer. Metastatic lung cancer. History of right posterior communicating artery aneurysm coiling.  EXAM: MRI HEAD WITHOUT AND WITH CONTRAST  TECHNIQUE: Multiplanar, multiecho pulse sequences of the brain and surrounding structures were obtained without and with intravenous contrast.  CONTRAST:  15 mL MultiHance IV  COMPARISON:  MRI head 01/30/2017, 05/25/2011  FINDINGS: Brain: New area of restricted diffusion head of caudate on the left compatible with acute/subacute infarct. Previously noted small areas of restricted diffusion in  the right occipital lobe now shows ill-defined enhancement, most consistent with subacute infarct. Small areas of restricted diffusion left parietal cortex and left posterior temporal lobe have resolved. Findings most compatible with ischemia rather than metastatic disease.  Chronic microvascular ischemic changes in the white matter and pons. Small chronic infarct left frontal lobe. Negative for hemorrhage.  Postcontrast imaging degraded by motion. 6 mm enhancing lesion right occipital lobe has angular margins and most compatible with subacute enhancing infarct based on prior MRI and morphology. No other worrisome enhancing lesions.  Vascular: Prior coiling of right posterior communicating artery aneurysm with susceptibility. Chronic occlusion left internal carotid artery  Skull and upper cervical spine: C3 vertebral body lesion has progressed in the interval compatible with metastatic disease. No calvarial lesions.  Sinuses/Orbits: Negative  Other: None  IMPRESSION: When compared with the prior MRI of 01/30/2017, there is improvement in the areas of restricted diffusion in the left parietal and left temporal lobe compatible with infarction. There enhancement of the small lesion in the right occipital lobe most consistent with subacute infarction. There is a new area of subacute infarct in the head of caudate on the left.  Chronic occlusion left internal carotid artery with chronic ischemic changes as above.  No definite metastatic disease in the brain. Continued close followup is warranted.  Progression of bone marrow lesion in the C3 vertebral body compatible with metastatic disease.   Electronically Signed   By: Franchot Gallo M.D.   On: 02/21/2017 07:52  [facility] Clinician Interpretation: I have personally reviewed the radiological images  as listed.  My interpretation, in the context of the patient's clinical presentation, is likely stroke(s) rather than  metastases   Assessment/Plan Cerebrovascular accident (CVA), unspecified mechanism ([place]) - Plan: MR MRA HEAD W WO CONTRAST, MR MRA NECK W WO CONTRAST  [name] has radiologic findings consistent with multi-focal infarcts.  She has a noted history of severe intracranial left carotid artery stenosis and PCOM aneursym s/p coiling in 2013.  At this time her stroke burden is bilateral, and she has been >5 years without vascular evaluation.  She has recent echocardiogram within past year through her cardiologist although we do not have record at this time.  Her recent cancer diagnosis increases the probability of an arterial vascular event, and she has already demonstrated hypercoagulability given very recent unprovoked DVT.   We believe she portends high risk from neurologic standpoint for surgery at this time.  We understand there is risk of pathologic leg fracture in absence of surgical stabilization.    This assessment was communicated to her orthopedic surgeon, Dr. Fredonia Highland, via direct call.  It is very likely surgery will be deferred at this time.  In the meantime, we have recommended an MRA head and neck for evaluation.  We have asked her to obtain copy of most recent echocardiogram as well.  Given acute unprovoked DVT, plan for anticoagulation with lovenox or xarelto is considered safe and appropriate from a neurologic standpoint at this time.  We spent twenty additional minutes teaching regarding the natural history, biology, and historical experience in the treatment of neurologic complications of cancer. We also provided teaching sheets for the patient to take home as an additional resource.  We appreciate the opportunity to participate in the care of [name].  She will return after MRA to review options for treatment or intervention.  Extensive counseling was provided on secondary stroke prevention including diet, exercise.  All questions were answered. The patient knows to call  the clinic with any problems, questions or concerns. No barriers to learning were detected.  The total time spent in the encounter was 45 minutes and more than 50% was on counseling and review of test results   Ventura Sellers, MD Medical Director of Neuro-Oncology [facility] at [place] 02/27/17 11:21 AM

## 2017-02-27 NOTE — Progress Notes (Signed)
DISCONTINUE ON PATHWAY REGIMEN - Non-Small Cell Lung     A cycle is every 21 days:     Pembrolizumab      Pemetrexed      Carboplatin   **Always confirm dose/schedule in your pharmacy ordering system**    REASON: Other Reason PRIOR TREATMENT: [id number]: Pembrolizumab 200 mg + Pemetrexed 500 mg/m2 + Carboplatin AUC=5 q21 Days x 4 Cycles  START ON PATHWAY REGIMEN - Non-Small Cell Lung     A cycle is every 21 days:     Pemetrexed      Carboplatin   **Always confirm dose/schedule in your pharmacy ordering system**    Patient Characteristics: Stage IV Metastatic, Nonsquamous, Initial Chemotherapy/Immunotherapy, PS = 2, PD-L1 Expression Positive 1-49% (TPS) / Negative / Not Tested / Awaiting Test Results AJCC T Category: T3 Current Disease Status: Distant Metastases AJCC N Category: N2 AJCC M Category: M1c AJCC 8 Stage Grouping: IVB Histology: Nonsquamous Cell ROS1 Rearrangement Status: Awaiting Test Results T790M Mutation Status: Not Applicable - EGFR Mutation Negative/Unknown Other Mutations/Biomarkers: No Other Actionable Mutations PD-L1 Expression Status: Awaiting Test Results Chemotherapy/Immunotherapy LOT: Initial Chemotherapy/Immunotherapy Molecular Targeted Therapy: Not Appropriate ALK Translocation Status: Awaiting Test Results Would you be surprised if this patient died  in the next year<= I would NOT be surprised if this patient died in the next year EGFR Mutation Status: Awaiting Test Results BRAF V600E Mutation Status: Awaiting Test Results Performance Status: PS = 2 Intent of Therapy: Non-Curative / Palliative Intent, Discussed with Patient

## 2017-02-27 NOTE — Progress Notes (Addendum)
Location/Histology of Brain Tumor: Stage IV cancer. Hx of breast (2004) and cervical (1977) ca. Now with metastatic breast ca vs primary lung ca (pt understands from [facility] its primary lung). Metastatic lesions to bone, spine, and brain. Returns today to review brain MRI and pathology.  Patient presented with symptoms of:  Severe pleuritic pain and hemoptysis end of November 2018.  Past or anticipated interventions, if any, per neurosurgery: No  Past or anticipated interventions, if any, per medical oncology: denies hx of chemotherapy  Dose of Decadron, if applicable: Medrol 8 mg daily. Denies taking/being prescribed decadron.  Recent neurologic symptoms, if any:   Seizures: no  Headaches: yes, right temporal, right parietal, or left temporal  Nausea: one episode of nausea a few days ago but no vomiting  Dizziness/ataxia: yes  Difficulty with hand coordination: no  Focal numbness/weakness: weakness in arms and leg plus neuropathy in hands and feet  Visual deficits/changes: has had floaters in the past but reports seeing a large black blob a few days ago that resolved quickly  Confusion/Memory deficits: poor short term memory but denies episodes of confusion   Painful bone metastases at present, if any: reports low back and left hip pain 7 on a scale of 0-10. Reports taking morphine short and long acting plus tramadol to manage pain  Bone marrow biopsy done 02/21/2017:   SAFETY ISSUES:  Prior radiation? No  Pacemaker/ICD?  NO  Possible current pregnancy?  No  Is the patient on methotrexate? On several different immune modulators but NOT methotrexate  Additional Complaints / other details: 67 year old female. Hx of DVT, left breast ca, cervical ca, diabetes, stroke, cerebral aneurysm and heart murmur   Reports she started Lovenox Saturday morning. Reports on Saturday she had blood in her stool. Reports this morning she almost passed out going down the steps but caught  herself. She reports since that time her right upper leg has felt different but is unable to describe how.    PET SCAN 02/06/17:,  IMPRESSION: 1. Stage IV carcinoma with differential including metastatic breast cancer versus primary lung cancer. Favor primary lung cancer. 2. Pleural-based hypermetabolic lesion in the LEFT upper lobe and LEFT lower lobe and LEFT hilar adenopathy concerning for bronchogenic carcinoma. 3. Two hypermetabolic liver metastasis. 4. Multiple foci of skeletal metastasis including the proximal femurs, pelvis, spine and sternum. Pathologic fracture of a LEFT sixth rib.   BRAIN: Subcentimeter RIGHT occipital, LEFT temporal lobe and LEFT parietal lobe foci of reduced diffusion in intermediate ADC valuesin minimal FLAIR T2 hyperintense signal. No susceptibility artifactto suggest hemorrhage. Old LEFT caudate head lacunar infarct. Smallarea LEFT frontal lobe encephalomalacia. A few additional scatteredsubcentimeter supratentorial white matter FLAIR T2 hyperintensitiesexclusive of aforementioned abnormality. Patchy pontine of white matter FLAIR T2 hyperintensities. No midline shift or mass effect.Ventricles and sulci are normal for patient's age. No abnormal extra-axial fluid collections.  U/S LIVER biopsy 03-07-17 Intramedullary nail bilateral 02-28-17

## 2017-02-27 NOTE — Progress Notes (Signed)
Radiation Oncology         [phone]   Name: [name]   Date: 02/27/2017   MRN: [id number]  DOB: 19-Aug-1950    Multidisciplinary Brain and Spine Oncology Clinic Follow-Up Visit Note  CC: Jani Gravel, MD  Jani Gravel, MD    ICD-10-CM   1. Multiple lesions of metastatic malignancy [facility]) C79.9   2. Primary cancer of left upper lobe of lung (HCC) C34.12     Diagnosis:   67 y.o. 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  The recent films were presented in our multidisciplinary conference with neuroradiology just prior to the clinic.  MRI of the brain with and without contrast on 02/20/2017 showed improvement in the areas of restricted diffusion in the left parietal and left temporal lobe, compatible with infarction. There was enhancement of the small lesion in the right occipital lobe, most consistent with subacute infarction, and a new area of subacute infarct in the head of caudate on the left. This is suggestive of no definite metastatic disease in the brain. The patient confirms that she did have a stroke in 2010. Continued close follow-up warranted.   The patient had a bone marrow biopsy done on 02/21/2017 which revealed metastatic adenocarcinoma. She is scheduled to undergo bilateral intramedullary nail tomorrow with Dr. Percell Miller. Her liver biopsy is scheduled for 03/07/2017.   On review of systems, the patient reports headaches in the left and right temporal and right parietal. She reports one episode of nausea but no vomiting. She reports dizziness. She reports weakness in arms and legs plus neuropathy in her hands and feet. She has had floaters in the past but reports seeing a large black "blob" that resolved quickly. She reports poor short-term memory but denies episodes of confusion. She denies history of seizures or difficulty with  coordination. She report slow back and left hip pain rated a 7 on a scale of 0-10, managed with morphine and tramadol.       Of note, the patient reports she started Lovenox two days ago and had blood in her stool. She reports this morning she almost passed out going down the steps but caught herself. She reports since that time her right upper leg has felt different but is unable to describe how.   ALLERGIES:  is allergic to aurothioglucose [solganal]; calcium channel blockers; feldene [piroxicam]; hydralazine; imdur [isosorbide mononitrate]; imuran [azathioprine]; methotrexate derivatives; neomycin; nickel; thimerosal; percocet [oxycodone-acetaminophen]; vioxx [rofecoxib]; arava [leflunomide]; asacol [mesalamine]; hydrocodone; iohexol; sulfa antibiotics; latex; and nsaids.  Meds: Current Outpatient Medications  Medication Sig Dispense Refill  . allopurinol (ZYLOPRIM) 300 MG tablet Take 300 mg by mouth daily.     Marland Kitchen atorvastatin (LIPITOR) 20 MG tablet Take 20 mg by mouth at bedtime.    . Calcium Carbonate-Vit D-Min (CALTRATE 600+D PLUS) 600-400 MG-UNIT per tablet Chew 1 tablet by mouth 2 (two) times daily.      . Cholecalciferol (VITAMIN D) 2000 UNITS tablet Take 2,000 Units by mouth daily.     . DULoxetine (CYMBALTA) 60 MG capsule Take 60 mg by mouth daily.    Marland Kitchen enoxaparin (LOVENOX) 30 MG/0.3ML injection Inject 0.6 mLs (60 mg total) into the skin every 12 (twelve) hours for 6 doses. 3.6 mL 0  . FIBER SELECT GUMMIES PO Take 2 each by mouth 2 (two) times daily.     . hydroxychloroquine (  PLAQUENIL) 200 MG tablet Take 200 mg by mouth daily.     Marland Kitchen ipratropium (ATROVENT) 0.06 % nasal spray Place 2 sprays into both nostrils 2 (two) times daily as needed for rhinitis.    Marland Kitchen MAGNESIUM OXIDE PO Take 500 mg by mouth daily.     . metFORMIN (GLUCOPHAGE-XR) 500 MG 24 hr tablet Take 500 mg by mouth 2 (two) times a week.    . methylPREDNISolone (MEDROL) 8 MG tablet Take 8 mg by mouth daily.    Marland Kitchen morphine (MS  CONTIN) 15 MG 12 hr tablet Take 1 tablet (15 mg total) by mouth every 12 (twelve) hours. 60 tablet 0  . morphine 20 MG/5ML solution Take 1 mL (4 mg total) by mouth 2 (two) times daily as needed for pain. (Patient taking differently: Take 4 mg by mouth 4 (four) times daily as needed for pain (breakthrough pain). ) 60 mL 0  . nebivolol (BYSTOLIC) 5 MG tablet Take 5 mg by mouth every evening.     Marland Kitchen omeprazole (PRILOSEC) 40 MG capsule TAKE (1) CAPSULE BY MOUTH ONCE DAILY. (Patient taking differently: TAKE 40 MG BY MOUTH ONCE DAILY.) 30 capsule 11  . Polyethyl Glycol-Propyl Glycol (SYSTANE) 0.4-0.3 % GEL Apply 1 application to eye daily as needed (for dry eyes).     . Rivaroxaban 15 & 20 MG TBPK Take as directed on package: Start with one '15mg'$  tablet by mouth twice a day with food. On Day 22, switch to one '20mg'$  tablet once a day with food. 51 each 0   No current facility-administered medications for this encounter.     Physical Findings: The patient is in no acute distress. Patient is alert and oriented.  height is '5\' 8"'$  (1.727 m) and weight is 162 lb (73.5 kg). Her oral temperature is 98.2 F (36.8 C). Her blood pressure is 153/49 (abnormal) and her pulse is 114 (abnormal). Her respiration is 18 and oxygen saturation is 100%. .  No significant changes.  Lab Findings: Lab Results  Component Value Date   WBC 19.7 (H) 02/24/2017   HGB 10.1 (L) 02/24/2017   HCT 36.2 02/24/2017   MCV 79.4 02/24/2017   PLT 288 02/24/2017    '@LASTCHEM'$ @  Radiographic Findings: Mr Brain Wo Contrast  Result Date: 01/30/2017 CLINICAL DATA:  Confusion. History of hypertension, hypercholesterolemia, stroke. History of breast cancer and endovascular embolization of RIGHT PCOM aneurysm. EXAM: MRI HEAD WITHOUT CONTRAST TECHNIQUE: Multiplanar, multiecho pulse sequences of the brain and surrounding structures were obtained without intravenous contrast. COMPARISON:  MRI of the head May 25, 2011 FINDINGS: Multiple sequences  are mildly motion degraded. BRAIN: Subcentimeter RIGHT occipital, LEFT temporal lobe and LEFT parietal lobe foci of reduced diffusion in intermediate ADC values in minimal FLAIR T2 hyperintense signal. No susceptibility artifact to suggest hemorrhage. Old LEFT caudate head lacunar infarct. Small area LEFT frontal lobe encephalomalacia. A few additional scattered subcentimeter supratentorial white matter FLAIR T2 hyperintensities exclusive of aforementioned abnormality. Patchy pontine of white matter FLAIR T2 hyperintensities. No midline shift or mass effect. Ventricles and sulci are normal for patient's age. No abnormal extra-axial fluid collections. VASCULAR: Chronic slow flow versus occluded LEFT internal carotid artery. SKULL AND UPPER CERVICAL SPINE: No abnormal sellar expansion. No suspicious calvarial bone marrow signal. Craniocervical junction maintained. SINUSES/ORBITS: The mastoid air-cells and included paranasal sinuses are well-aerated. The included ocular globes and orbital contents are non-suspicious. OTHER: None. IMPRESSION: 1. Mildly motion degraded noncontrast MRI of the head. 2. Three subcentimeter foci of supratentorial signal  abnormality concerning for metastatic disease. 3. New nonacute small LEFT frontal lobe/MCA territory infarct. 4. Mild to moderate chronic small vessel ischemic disease. Old LEFT basal ganglia lacunar infarct. 5. These results will be called to the ordering clinician or representative by the Radiologist Assistant, and communication documented in the PACS or zVision Dashboard. Electronically Signed   By: Elon Alas M.D.   On: 01/30/2017 16:07   Mr Jeri Cos ZT Contrast  Result Date: 02/21/2017 CLINICAL DATA:  Breast cancer. Metastatic lung cancer. History of right posterior communicating artery aneurysm coiling. EXAM: MRI HEAD WITHOUT AND WITH CONTRAST TECHNIQUE: Multiplanar, multiecho pulse sequences of the brain and surrounding structures were obtained without and  with intravenous contrast. CONTRAST:  15 mL MultiHance IV COMPARISON:  MRI head 01/30/2017, 05/25/2011 FINDINGS: Brain: New area of restricted diffusion head of caudate on the left compatible with acute/subacute infarct. Previously noted small areas of restricted diffusion in the right occipital lobe now shows ill-defined enhancement, most consistent with subacute infarct. Small areas of restricted diffusion left parietal cortex and left posterior temporal lobe have resolved. Findings most compatible with ischemia rather than metastatic disease. Chronic microvascular ischemic changes in the white matter and pons. Small chronic infarct left frontal lobe. Negative for hemorrhage. Postcontrast imaging degraded by motion. 6 mm enhancing lesion right occipital lobe has angular margins and most compatible with subacute enhancing infarct based on prior MRI and morphology. No other worrisome enhancing lesions. Vascular: Prior coiling of right posterior communicating artery aneurysm with susceptibility. Chronic occlusion left internal carotid artery Skull and upper cervical spine: C3 vertebral body lesion has progressed in the interval compatible with metastatic disease. No calvarial lesions. Sinuses/Orbits: Negative Other: None IMPRESSION: When compared with the prior MRI of 01/30/2017, there is improvement in the areas of restricted diffusion in the left parietal and left temporal lobe compatible with infarction. There enhancement of the small lesion in the right occipital lobe most consistent with subacute infarction. There is a new area of subacute infarct in the head of caudate on the left. Chronic occlusion left internal carotid artery with chronic ischemic changes as above. No definite metastatic disease in the brain. Continued close followup is warranted. Progression of bone marrow lesion in the C3 vertebral body compatible with metastatic disease. Electronically Signed   By: Franchot Gallo M.D.   On: 02/21/2017  07:52   Mr Cervical Spine Wo Contrast  Result Date: 01/30/2017 CLINICAL DATA:  Confusion.  History of breast and cervical CA. EXAM: MRI CERVICAL SPINE WITHOUT CONTRAST TECHNIQUE: Multiplanar, multisequence MR imaging of the cervical spine was performed. No intravenous contrast was administered. COMPARISON:  No prior MRI of the cervical spine. CT of chest 01/25/2017. FINDINGS: The study was ordered without and with contrast. According to technologist notes, the patient refused contrast, stating she is allergic to it and does not want. Sensitivity in the detection of metastatic disease is diminished in the absence of IV gadolinium. The patient was unable to remain motionless for the exam. Small or subtle lesions could be overlooked. Alignment: Straightening of the normal cervical lordosis. Trace anterolisthesis C4-5, 1-2 mm. Vertebrae: There is a metastatic deposit in the T1 vertebral body, sclerotic, correlating with recent CT scan. There is near complete replacement of that vertebral body. There is also a suspected metastatic deposit in the posterior inferior aspect of the C3 vertebral body, T1 hypointense, T2 hypointense, STIR hyperintense. Less than 1 cm in size. Suspected bone island superior C6 endplate. Cord: No abnormal cord signal. Assessment of epidural  tumor or intramedullary disease is hampered in the lack of IV contrast. Mild stenosis at C4-5 appears noncompressive. Posterior Fossa, vertebral arteries, paraspinal tissues: No posterior fossa abnormality. No tonsillar herniation. Flow voids are maintained. No neck masses are evident. 1 cm Lung apex lesion suspected on the LEFT Disc levels: C2-3:  Normal. C3-4: Facet arthropathy worse on the LEFT. Uncinate spurring contributes to LEFT C4 foraminal narrowing. Metastatic disease to the posterior vertebral body without definite epidural tumor. C4-5: 1-2 mm anterolisthesis. Annular bulge. Mild stenosis. Facet arthropathy. LEFT C5 foraminal narrowing is  possible. C5-6:  Annular bulge.  No definite impingement. C6-7: Disc space narrowing. Osseous spurring. Annular bulge. BILATERAL C7 foraminal narrowing suspected. C7-T1: Unremarkable disc space. Facet arthropathy. No impingement. No definite epidural tumor. IMPRESSION: Motion degraded, noncontrast exam of the cervical spine suggesting metastatic disease at C3 and T1. No visible pathologic fracture or epidural tumor. Multilevel spondylosis, with facet arthropathy, uncinate spurring, disc space narrowing, and annular bulging as described. Potentially symptomatic foraminal narrowing at multiple levels. See discussion above. Suspected 1 cm LEFT apex lung lesion. Electronically Signed   By: Staci Righter M.D.   On: 01/30/2017 16:23   Mr Thoracic Spine W Wo Contrast  Result Date: 02/03/2017 CLINICAL DATA:  Breast cancer, possible bone Mets. EXAM: MRI THORACIC AND LUMBAR SPINE WITHOUT AND WITH CONTRAST TECHNIQUE: Multiplanar and multiecho pulse sequences of the thoracic and lumbar spine were obtained without and with intravenous contrast. CONTRAST:  68m MULTIHANCE GADOBENATE DIMEGLUMINE 529 MG/ML IV SOLN COMPARISON:  CTA chest 01/25/2017. FINDINGS: MRI THORACIC SPINE FINDINGS Alignment:  Anatomic. Vertebrae: Metastatic deposits are seen at multiple levels. T1 there is near complete replacement of the vertebral body by tumor. Patient is at risk for pathologic fracture. At T10, there is a bilobed lesion, each approximately 1 cm diameter, apparent central necrosis, mild peripheral postcontrast enhancement. Additionally at T10 there is a RIGHT pedicle and transverse process lesion, approximately 1 x 2 cm. No epidural tumor or tremor the foramen. I went there is a 5 mm lesion in T12 inferiorly. This is located on the LEFT. Cord:  Normal signal and morphology. Paraspinal and other soft tissues: Paravertebral tumor at T5-T6 on the LEFT. Please see CT chest for additional findings. Disc levels: No disc protrusion or spinal  stenosis. MRI LUMBAR SPINE FINDINGS Segmentation:  Standard. Alignment:  Trace anterolisthesis L4-5, facet mediated. Vertebrae: Metastatic deposits in the L4 and L5 vertebrae. L4 lesion is significant size, almost 50% ridge body involvement without pathologic fracture. It is located in the RIGHT half of the vertebral body. No associated epidural tumor. Smaller lesion in the LEFT L5 vertebral body, posterior midline, slightly eccentric to LEFT. 15 mm largest dimension. Again no epidural tumor. Postcontrast enhancement associated with both the L4 and L5 lesions. Bone island RIGHT S1. Conus medullaris: Extends to the L1-L2 level and appears normal. Paraspinal and other soft tissues: Unremarkable. Disc levels: L1-L2:  Normal. L2-L3:  Normal. L3-L4:  Normal. L4-L5: Trace anterolisthesis. Moderately advanced facet arthropathy. Foraminal protrusion on the RIGHT. RIGHT L4 nerve root impingement. L5-S1: Unremarkable disc space. Facet arthropathy. No impingement. IMPRESSION: THORACIC: Metastatic disease at T1, T10, and T12. No epidural tumor. Near complete replacement T1. No thoracic disc protrusion. No intraspinal mass lesion or cord abnormality. LUMBAR: Osseous metastatic disease L4 and L5, larger at L4. See discussion above. No epidural tumor, but foraminal protrusion at L4-5 on the RIGHT, could affect the RIGHT L4 nerve root. Electronically Signed   By: JRoderic OvensD.  On: 02/03/2017 15:13   Mr Lumbar Spine W Wo Contrast  Result Date: 02/03/2017 CLINICAL DATA:  Breast cancer, possible bone Mets. EXAM: MRI THORACIC AND LUMBAR SPINE WITHOUT AND WITH CONTRAST TECHNIQUE: Multiplanar and multiecho pulse sequences of the thoracic and lumbar spine were obtained without and with intravenous contrast. CONTRAST:  5m MULTIHANCE GADOBENATE DIMEGLUMINE 529 MG/ML IV SOLN COMPARISON:  CTA chest 01/25/2017. FINDINGS: MRI THORACIC SPINE FINDINGS Alignment:  Anatomic. Vertebrae: Metastatic deposits are seen at multiple levels.  T1 there is near complete replacement of the vertebral body by tumor. Patient is at risk for pathologic fracture. At T10, there is a bilobed lesion, each approximately 1 cm diameter, apparent central necrosis, mild peripheral postcontrast enhancement. Additionally at T10 there is a RIGHT pedicle and transverse process lesion, approximately 1 x 2 cm. No epidural tumor or tremor the foramen. I went there is a 5 mm lesion in T12 inferiorly. This is located on the LEFT. Cord:  Normal signal and morphology. Paraspinal and other soft tissues: Paravertebral tumor at T5-T6 on the LEFT. Please see CT chest for additional findings. Disc levels: No disc protrusion or spinal stenosis. MRI LUMBAR SPINE FINDINGS Segmentation:  Standard. Alignment:  Trace anterolisthesis L4-5, facet mediated. Vertebrae: Metastatic deposits in the L4 and L5 vertebrae. L4 lesion is significant size, almost 50% ridge body involvement without pathologic fracture. It is located in the RIGHT half of the vertebral body. No associated epidural tumor. Smaller lesion in the LEFT L5 vertebral body, posterior midline, slightly eccentric to LEFT. 15 mm largest dimension. Again no epidural tumor. Postcontrast enhancement associated with both the L4 and L5 lesions. Bone island RIGHT S1. Conus medullaris: Extends to the L1-L2 level and appears normal. Paraspinal and other soft tissues: Unremarkable. Disc levels: L1-L2:  Normal. L2-L3:  Normal. L3-L4:  Normal. L4-L5: Trace anterolisthesis. Moderately advanced facet arthropathy. Foraminal protrusion on the RIGHT. RIGHT L4 nerve root impingement. L5-S1: Unremarkable disc space. Facet arthropathy. No impingement. IMPRESSION: THORACIC: Metastatic disease at T1, T10, and T12. No epidural tumor. Near complete replacement T1. No thoracic disc protrusion. No intraspinal mass lesion or cord abnormality. LUMBAR: Osseous metastatic disease L4 and L5, larger at L4. See discussion above. No epidural tumor, but foraminal  protrusion at L4-5 on the RIGHT, could affect the RIGHT L4 nerve root. Electronically Signed   By: JStaci RighterM.D.   On: 02/03/2017 15:13   Nm Pet Image Initial (pi) Skull Base To Thigh  Result Date: 02/06/2017 CLINICAL DATA:  INITIAL treatment strategy for carcinoma unknown primary. History treated breast cancer EXAM: NUCLEAR MEDICINE PET SKULL BASE TO THIGH TECHNIQUE: 7.9 mCi F-18 FDG was injected intravenously. Full-ring PET imaging was performed from the skull base to thigh after the radiotracer. CT data was obtained and used for attenuation correction and anatomic localization. FASTING BLOOD GLUCOSE:  Value: 109 mg/dl COMPARISON:  CT 12/10/2015 FINDINGS: NECK No hypermetabolic lymph nodes in the neck. CHEST Hypermetabolic nodule in the superior medial apex of the LEFT lung measures 1.7 cm with SUV max equals 6.1. hypermetabolic perihilar lymph node posterior to the LEFT hilum with SUV max equal 9.3 and measuring 2.0 cm (image 60, series 4) Hypermetabolic nodular thickening in the superior segment of the LEFT lower lobe with SUV max equal 6.4. There is no abnormal metabolic activity the LEFT or RIGHT breast tissue. No hypermetabolic axillary lymph nodes. ABDOMEN/PELVIS Intensely metabolic lesion in the dome the liver with SUV max equal 8.0. This corresponds to low-density lesion measuring approximately 2.3 cm. There is  a second lesion within the caudate lobe of the liver measuring 2.2 cm with SUV max equal 12.8. No hypermetabolic abdominopelvic lymph nodes. No abnormal metabolic activity in pancreas kidneys or bowel. Physiologic activity in the bowel noted. SKELETON Multiple sites of hypermetabolic skeletal metastasis with subtle bony changes on CT portion exam. For example lesion in the medial aspect of the RIGHT iliac bone with SUV max equal 11.3 and the sclerotic medullary changes. rounded lesion in the L4 vertebral body measuring 1.9 cm with SUV max equal 6.7. Lesion in the manubrium with SUV max  equal 7.1. Additional rib lesions are present. Pathologic fracture of the LEFT lateral rib (sixth rib). Lesions in the posterior LEFT acetabulum and proximal bilateral proximal femour lesions at the level of the lesser trochanters. IMPRESSION: 1. Stage IV carcinoma with differential including metastatic breast cancer versus primary lung cancer. Favor primary lung cancer. 2. Pleural-based hypermetabolic lesion in the LEFT upper lobe and LEFT lower lobe and LEFT hilar adenopathy concerning for bronchogenic carcinoma. 3. Two hypermetabolic liver metastasis. 4. Multiple foci of skeletal metastasis including the proximal femurs, pelvis, spine and sternum. Pathologic fracture of a LEFT sixth rib. Electronically Signed   By: Suzy Bouchard M.D.   On: 02/06/2017 09:55   Ct Biopsy  Result Date: 02/21/2017 INDICATION: Bone lesions EXAM: CT BIOPSY; CT BONE MARROW BIOPSY AND ASPIRATION MEDICATIONS: None. ANESTHESIA/SEDATION: Fentanyl 100 mcg IV; Versed 2 mg IV Moderate Sedation Time:  20 minutes The patient was continuously monitored during the procedure by the interventional radiology nurse under my direct supervision. FLUOROSCOPY TIME:  Fluoroscopy Time:  minutes  seconds ( mGy). COMPLICATIONS: None immediate. PROCEDURE: Informed written consent was obtained from the patient after a thorough discussion of the procedural risks, benefits and alternatives. All questions were addressed. Maximal Sterile Barrier Technique was utilized including caps, mask, sterile gowns, sterile gloves, sterile drape, hand hygiene and skin antiseptic. A timeout was performed prior to the initiation of the procedure. Under CT guidance, a(n) 11 gauge guide needle was advanced into the right iliac bone. Aspirates and a core were obtained. Subsequently, the 11 gauge needle was directed towards a sclerotic bone lesion in the right iliac bone and a final 11 gauge core was obtained. Post biopsy images demonstrate no hemorrhage. Patient tolerated the  procedure well without complication. Vital sign monitoring by nursing staff during the procedure will continue as patient is in the special procedures unit for post procedure observation. The images document guide needle placement within the normal portion of the right iliac bone. Subsequent images demonstrate needle placement in the sclerotic lesion within the right iliac bone. Post biopsy images demonstrate no hemorrhage. FINDINGS: The images document guide needle placement within the normal portion of the right iliac bone. Subsequent images demonstrate needle placement in the sclerotic lesion within the right iliac bone. Post biopsy images demonstrate no hemorrhage. IMPRESSION: Successful right sclerotic bone lesion core biopsy. Successful right iliac bone marrow biopsy. Electronically Signed   By: Marybelle Killings M.D.   On: 02/21/2017 11:20   Ct Biopsy  Result Date: 02/21/2017 INDICATION: Bone lesions EXAM: CT BIOPSY; CT BONE MARROW BIOPSY AND ASPIRATION MEDICATIONS: None. ANESTHESIA/SEDATION: Fentanyl 100 mcg IV; Versed 2 mg IV Moderate Sedation Time:  20 minutes The patient was continuously monitored during the procedure by the interventional radiology nurse under my direct supervision. FLUOROSCOPY TIME:  Fluoroscopy Time:  minutes  seconds ( mGy). COMPLICATIONS: None immediate. PROCEDURE: Informed written consent was obtained from the patient after a thorough discussion of  the procedural risks, benefits and alternatives. All questions were addressed. Maximal Sterile Barrier Technique was utilized including caps, mask, sterile gowns, sterile gloves, sterile drape, hand hygiene and skin antiseptic. A timeout was performed prior to the initiation of the procedure. Under CT guidance, a(n) 11 gauge guide needle was advanced into the right iliac bone. Aspirates and a core were obtained. Subsequently, the 11 gauge needle was directed towards a sclerotic bone lesion in the right iliac bone and a final 11 gauge core  was obtained. Post biopsy images demonstrate no hemorrhage. Patient tolerated the procedure well without complication. Vital sign monitoring by nursing staff during the procedure will continue as patient is in the special procedures unit for post procedure observation. The images document guide needle placement within the normal portion of the right iliac bone. Subsequent images demonstrate needle placement in the sclerotic lesion within the right iliac bone. Post biopsy images demonstrate no hemorrhage. FINDINGS: The images document guide needle placement within the normal portion of the right iliac bone. Subsequent images demonstrate needle placement in the sclerotic lesion within the right iliac bone. Post biopsy images demonstrate no hemorrhage. IMPRESSION: Successful right sclerotic bone lesion core biopsy. Successful right iliac bone marrow biopsy. Electronically Signed   By: Marybelle Killings M.D.   On: 02/21/2017 11:20   Ct Bone Marrow Biopsy & Aspiration  Result Date: 02/21/2017 INDICATION: Bone lesions EXAM: CT BIOPSY; CT BONE MARROW BIOPSY AND ASPIRATION MEDICATIONS: None. ANESTHESIA/SEDATION: Fentanyl 100 mcg IV; Versed 2 mg IV Moderate Sedation Time:  20 minutes The patient was continuously monitored during the procedure by the interventional radiology nurse under my direct supervision. FLUOROSCOPY TIME:  Fluoroscopy Time:  minutes  seconds ( mGy). COMPLICATIONS: None immediate. PROCEDURE: Informed written consent was obtained from the patient after a thorough discussion of the procedural risks, benefits and alternatives. All questions were addressed. Maximal Sterile Barrier Technique was utilized including caps, mask, sterile gowns, sterile gloves, sterile drape, hand hygiene and skin antiseptic. A timeout was performed prior to the initiation of the procedure. Under CT guidance, a(n) 11 gauge guide needle was advanced into the right iliac bone. Aspirates and a core were obtained. Subsequently, the 11  gauge needle was directed towards a sclerotic bone lesion in the right iliac bone and a final 11 gauge core was obtained. Post biopsy images demonstrate no hemorrhage. Patient tolerated the procedure well without complication. Vital sign monitoring by nursing staff during the procedure will continue as patient is in the special procedures unit for post procedure observation. The images document guide needle placement within the normal portion of the right iliac bone. Subsequent images demonstrate needle placement in the sclerotic lesion within the right iliac bone. Post biopsy images demonstrate no hemorrhage. FINDINGS: The images document guide needle placement within the normal portion of the right iliac bone. Subsequent images demonstrate needle placement in the sclerotic lesion within the right iliac bone. Post biopsy images demonstrate no hemorrhage. IMPRESSION: Successful right sclerotic bone lesion core biopsy. Successful right iliac bone marrow biopsy. Electronically Signed   By: Marybelle Killings M.D.   On: 02/21/2017 11:20    Impression:  [name] is a 67 y.o. 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 Her brain scan shows no metastatic disease. However, the findings on her brain scan suggest thromboembolic events. Molecular profiling of her recent biopsy remains to be completed. She may ultimately benefit from radiation once we establish her molecular profile and treatment  plan.   Plan:  The patient is scheduled to undergo bilateral intramedullary nail tomorrow with Dr. Percell Miller. We will reach out to him to let him know her stroke history. She knows to stop her Lovenox today. The patient will then have a liver biopsy done on 03/07/2017. She will follow up after biopsy.   Today, she'll meet with the following other specialists also given the complexity of her issues: -[facility] - [place]  I  spent time face to face with the patient and more than 50% of that time was spent in counseling and/or coordination of care.  _____________________________________  Sheral Apley. Tammi Klippel, M.D.  This document serves as a record of services personally performed by Tyler Pita, MD. It was created on his behalf by Rae Lips, a trained medical scribe. The creation of this record is based on the scribe's personal observations and the provider's statements to them. This document has been checked and approved by the attending provider.

## 2017-02-27 NOTE — Consult Note (Signed)
Consultation Note Date: 02/27/2017   Patient Name: [name]  DOB: 01/10/1951  MRN: [id number]  Age / Sex: 67 y.o., female  PCP: Jani Gravel, MD Referring Physician: Tyler Pita, MD  Reason for Consultation: Establishing goals of care and Psychosocial/spiritual support  HPI/Patient Profile: 67 y.o. 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  Seen today in the OP radiation-oncology clinic/ Dr Tammi Klippel,  accompanied by her husband.   She is here today to  review   MRI of the brain, with and without contrast,  from 02/20/2017.  It showed improvement in the areas of restricted diffusion in the left parietal and left temporal lobe, compatible with infarction.  There was enhancement of the small lesion in the right occipital lobe, most consistent with subacute infarction, and a new area of subacute infarct in the head of caudate on the left. This suggests no definite metastatic disease in the brain, radiologic findings consistent with multi-focal infarcts The patient confirms that she did have a stroke in 2010.   The patient had a bone marrow biopsy done on 02/21/2017 which revealed metastatic adenocarcinoma. She is scheduled to undergo bilateral intramedullary nail tomorrow with Dr. Percell Miller. Her liver biopsy is scheduled for 03/07/2017.   She is scheduled to see Dr. Mickeal Skinner later this morning.  Patient and her family face  treatment option decisions, advanced directive decisions, and anticipatory  care needs.  Clinical Assessment and Goals of Care:  This NP Wadie Lessen reviewed medical records, received report from team,  and then meet with  the patient's  along with her husband to introduce the concept of palliative medicine into  a holistic treatment plan.  Concept of Hospice and Palliative Care were discussed.  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  Today we discussed diagnosis, prognosis, GOC,  and options.  Patient  and her husband are understandably anxious at this recent diagnosis.  It has been especially difficult taking in complex medical information from multiple healthcare providers.  Values and goals of care important to patient and family were attempted to be elicited.   Questions and concerns addressed.   Family encouraged to call with questions or concerns.    PMT will continue to support holistically.   PATIENT    SUMMARY OF RECOMMENDATIONS    Code Status/Advance Care Planning:  DNR  Discussed the importance of documentation of advanced directives and healthcare power of attorney  Symptom Management:   Pain:  Continue MS Contin 15 mg every 12 hrs   Additional Recommendations (Limitations, Scope, Preferences):     Patient is open to all offered and available medical interventions to prolong life.  She is hopeful for treatment that will give her more quality time.  She has an appointment this morning with Dr. Mickeal Skinner and planned follow-up with Dr. Percell Miller  Psycho-social/Spiritual:   Emotional support offered.   Discussed with  patient the importance of continued conversation with family and their  medical providers regarding overall plan of care and treatment options,  ensuring decisions are within the context of the patients values and GOCs.   Prognosis:   Unable to determine     Primary Diagnoses: Present on Admission: **None**   I have reviewed the medical record, interviewed the patient and family, and examined the patient. The following aspects are pertinent.  Past Medical History:  Diagnosis Date  . Anemia   . Angina   . Arthritis    Rheumatoid  . Brain cancer ([place])   . Breast cancer ([place])   . Cancer ([place])    left breast, cervical cancer  . Colon polyps   . Constipation    . Diabetes mellitus without complication ([place])   . Diarrhea   . Dysrhythmia   . GERD (gastroesophageal reflux disease)   . Headache   . Heart murmur   . History of kidney stones   . Hypercholesterolemia   . Hypertension   . Lung cancer ([place])   . Numbness and tingling in right hand   . Numbness and tingling of both feet   . Peripheral vascular disease ([place])    cerebrel aneurysm /sp coil  . Pneumonia   . PONV (postoperative nausea and vomiting)   . Raynaud's syndrome   . Rheumatoid arthritis with rheumatoid factor (H[facility])   . Right knee DJD   . Stroke [facility])    jan 2012   Social History   Socioeconomic History  . Marital status: Married    Spouse name: Hedy Camara  . Number of children: o  . Years of education: 38  . Highest education level: Not on file  Social Needs  . Financial resource strain: Not on file  . Food insecurity - worry: Not on file  . Food insecurity - inability: Not on file  . Transportation needs - medical: Not on file  . Transportation needs - non-medical: Not on file  Occupational History  . 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 Smoking status: Former Smoker    Packs/day: 2.00    Years: 15.00    Pack years: 30.00    Last attempt to quit: 02/20/1984    Years since quitting: 33.0  . Smokeless tobacco: Never Used  . Tobacco comment: Quit 1986  Substance and Sexual Activity  . Alcohol use: No  . Drug use: No  . Sexual activity: Not Currently  Other Topics Concern  . Not on file  Social History Narrative   Employed with [employer]. Retired January 2017. Married with no children. Quit smoking 1986. No alcohol or illicit drug use.    Right handed    Some college.   Caffeine one cup of tea daily.   Family History  Problem Relation Age of Onset  . Hypertension Father   . Heart attack Father   . Heart disease Maternal Uncle   . Cancer Maternal Aunt        breast  .  Cancer Other        kidney  . Cancer Cousin   . Colon cancer Neg Hx    Scheduled Meds: Continuous Infusions: PRN Meds:. Medications Prior to Admission:  Prior to Admission medications   Medication Sig Start Date End Date Taking? Authorizing Provider  allopurinol (ZYLOPRIM) 300 MG tablet Take 300 mg by mouth daily.  [provider]  atorvastatin (LIPITOR) 20 MG tablet Take 20 mg by mouth at bedtime.    [provider]  Calcium Carbonate-Vit D-Min (CALTRATE 600+D PLUS) 600-400 MG-UNIT per tablet Chew 1 tablet by mouth 2 (two) times daily.      [provider]  Cholecalciferol (VITAMIN D) 2000 UNITS tablet Take 2,000 Units by mouth daily.     [provider]  DULoxetine (CYMBALTA) 60 MG capsule Take 60 mg by mouth daily.    [provider]  enoxaparin (LOVENOX) 30 MG/0.3ML injection Inject 0.6 mLs (60 mg total) into the skin every 12 (twelve) hours for 6 doses. 02/24/17 02/27/17  Perlov, Marinell Blight, MD  FIBER SELECT GUMMIES PO Take 2 each by mouth 2 (two) times daily.     [provider]  hydroxychloroquine (PLAQUENIL) 200 MG tablet Take 200 mg by mouth daily.     [provider]  ipratropium (ATROVENT) 0.06 % nasal spray Place 2 sprays into both nostrils 2 (two) times daily as needed for rhinitis.    [provider]  MAGNESIUM OXIDE PO Take 500 mg by mouth daily.     [provider]  metFORMIN (GLUCOPHAGE-XR) 500 MG 24 hr tablet Take 500 mg by mouth 2 (two) times a week.    [provider]  methylPREDNISolone (MEDROL) 8 MG tablet Take 8 mg by mouth daily.    [provider]  morphine (MS CONTIN) 15 MG 12 hr tablet Take 1 tablet (15 mg total) by mouth every 12 (twelve) hours. 01/31/17   Twana First, MD  morphine 20 MG/5ML solution Take 1 mL (4 mg total) by mouth 2 (two) times daily as needed for pain. Patient taking differently: Take 4 mg by mouth 4 (four) times daily as needed for pain  (breakthrough pain).  01/26/17   Brand Males, MD  nebivolol (BYSTOLIC) 5 MG tablet Take 5 mg by mouth every evening.     [provider]  omeprazole (PRILOSEC) 40 MG capsule TAKE (1) CAPSULE BY MOUTH ONCE DAILY. Patient taking differently: TAKE 40 MG BY MOUTH ONCE DAILY. 07/21/14   Rogene Houston, MD  Polyethyl Glycol-Propyl Glycol (SYSTANE) 0.4-0.3 % GEL Apply 1 application to eye daily as needed (for dry eyes).     [provider]  Rivaroxaban 15 & 20 MG TBPK Take as directed on package: Start with one '15mg'$  tablet by mouth twice a day with food. On Day 22, switch to one '20mg'$  tablet once a day with food. 02/24/17   Ardath Sax, MD   Allergies  Allergen Reactions  . Aurothioglucose [Solganal] Other (See Comments)    REACTION: VASCULITIS   . Feldene [Piroxicam] Anaphylaxis and Other (See Comments)    SKIN BLISTERS  . Hydralazine Other (See Comments)    REACTION: Chest pains and headaches: IV only  . Imdur [Isosorbide Mononitrate] Other (See Comments)    REACTION: PATIENT IS UNABLE TO WALK OR FUNCTION  . Imuran [Azathioprine] Other (See Comments)    Could not walk or function  . Methotrexate Derivatives Other (See Comments)    REACTION: VASCULITIS  . Calcium Channel Blockers Palpitations  . Sulfa Antibiotics Diarrhea and Nausea And Vomiting  . Thimerosal Other (See Comments)    SKIN BLISTERED  . Vioxx [Rofecoxib] Other (See Comments)    REACTION: CAUSED MORE JOINT PAIN  . Arava [Leflunomide]     UNSPECIFIED REACTION   . Asacol [Mesalamine]     UNSPECIFIED REACTION   . Iohexol Other (See  Comments)    PT STATES SHE WAS TOLD NEVER TO HAVE CT CONTRAST AGAIN.  SHE HAD SOME KIND OF REACTION BUT DOESNT REMEBER WHAT HAPPENED   . Hydrocodone Itching  . Latex Rash  . Neomycin Rash  . Nickel Rash  . Nsaids Other (See Comments)    GI UPSET , CAN TOLERATE SOME NSAIDS  . Percocet [Oxycodone-Acetaminophen] Other (See Comments)    INSOMNIA   Review of Systems    Constitutional: Positive for fatigue.  Neurological: Positive for weakness.  Psychiatric/Behavioral: Positive for decreased concentration.    Physical Exam  Constitutional: She is oriented to person, place, and time. She appears well-developed.  Pulmonary/Chest: Effort normal.  Neurological: She is alert and oriented to person, place, and time.  Skin: Skin is warm and dry.    Vital Signs: There were no vitals taken for this visit.         SpO2:   O2 Device:  O2 Flow Rate: .   IO: Intake/output summary: No intake or output data in the 24 hours ending 02/27/17 1559  LBM:   Baseline Weight:   Most recent weight:       Palliative Assessment/Data: 40 %   Discussed with Dr Tammi Klippel  Time In: 0900 Time Out: 1015 Time Total: 75 minutes  Greater than 50%  of this time was spent counseling and coordinating care related to the above assessment and plan.  Signed by: Wadie Lessen, NP   Please contact Palliative Medicine Team phone at [phone] for questions and concerns.  For individual provider: See Shea Evans

## 2017-02-28 ENCOUNTER — Inpatient Hospital Stay (HOSPITAL_COMMUNITY): Admission: RE | Admit: 2017-02-28 | Payer: Medicare Other | Source: Ambulatory Visit | Admitting: Orthopedic Surgery

## 2017-02-28 ENCOUNTER — Encounter (HOSPITAL_COMMUNITY): Payer: Self-pay | Admitting: *Deleted

## 2017-02-28 ENCOUNTER — Ambulatory Visit (HOSPITAL_COMMUNITY): Payer: Medicare Other | Admitting: Hematology and Oncology

## 2017-02-28 ENCOUNTER — Telehealth: Payer: Self-pay | Admitting: Internal Medicine

## 2017-02-28 ENCOUNTER — Encounter (HOSPITAL_COMMUNITY): Admission: RE | Payer: Self-pay | Source: Ambulatory Visit

## 2017-02-28 SURGERY — FIXATION, FRACTURE, INTERTROCHANTERIC, WITH INTRAMEDULLARY ROD
Anesthesia: Choice | Laterality: Bilateral

## 2017-02-28 NOTE — Telephone Encounter (Signed)
Per 1/14 no los

## 2017-02-28 NOTE — Progress Notes (Signed)
Per Dr. Jerilynn Mages. Perlov, it is okay for the patient to stop Xarelto 24 hours prior to port-a-cath insertion.  It is okay to restart Xarelto the morning after procedure, unless directed otherwise by surgeon.

## 2017-03-06 ENCOUNTER — Telehealth: Payer: Self-pay

## 2017-03-06 DIAGNOSIS — Z66 Do not resuscitate: Secondary | ICD-10-CM | POA: Insufficient documentation

## 2017-03-06 DIAGNOSIS — Z515 Encounter for palliative care: Secondary | ICD-10-CM | POA: Insufficient documentation

## 2017-03-06 NOTE — Telephone Encounter (Signed)
Fax sent to [facility] in response to verbal order refill request for lovenox. Sent to [phone]. Confirmed fax receipt 03/06/17 at 1058.

## 2017-03-06 NOTE — Telephone Encounter (Signed)
Cytogenetic lab results faxed to Dr. Sherrine Maples, [place], at [facility]. Confirmed fax receipt 03/06/17 at 1239.

## 2017-03-07 ENCOUNTER — Encounter (HOSPITAL_COMMUNITY): Payer: Self-pay

## 2017-03-07 ENCOUNTER — Other Ambulatory Visit: Payer: Self-pay | Admitting: Radiology

## 2017-03-07 ENCOUNTER — Ambulatory Visit (HOSPITAL_COMMUNITY): Payer: Medicare Other

## 2017-03-07 LAB — CHROMOSOME ANALYSIS, BONE MARROW

## 2017-03-07 NOTE — Patient Instructions (Signed)
[place]   CHEMOTHERAPY INSTRUCTIONS  You have been diagnosed with Stage 4 lung cancer.  We are going to treat you with carboplatin and alimta every 3 weeks.  One cycle is 3 weeks.  This is with palliative intent, which means that you are treatable but not curable.  You will see the doctor regularly throughout treatment.  We monitor your lab work prior to every treatment.  The doctor monitors your response to treatment by the way you are feeling, your blood work, and scans periodically.  There will be wait times while you are here for treatment.  It will take 30 minutes to 1 hour for your lab work to result.  Then pharmacy has to mix your medications.   You must take folic acid every day by mouth beginning 7 days before your first dose of alimta.  You must keep taking folic acid every day during the time you are being treated with alimta, and for every day for 21 days after you receive your last alimta dose.    You will get B12 injections while you are on alimta.  The first one about 1 week prior to starting treatment and then about every 9 weeks during treatment.   You will take steriods the day before, and then for 3 days after each treatment of alimta to reduce rash.     You will receive the following pre-medications prior to receiving chemotherapy: Premeds: Aloxi (IV push)- high powered nausea/vomiting prevention medication used for chemotherapy patients.  Dexamethasone - steroid - given to reduce the risk of you having an allergic type reaction to the chemotherapy. Dex can cause you to feel energized, nervous/anxious/jittery, make you have trouble sleeping, and/or make you feel hot/flushed in the face/neck and/or look pink/red in the face/neck. These side effects will pass as the Dex wears off. (takes 20 minutes to infuse) POTENTIAL SIDE EFFECTS OF TREATMENT:  Carboplatin (Generic Name) Other Names: Paraplatin, CBDCA  About This Drug Carboplatin is a  drug used to treat cancer. This drug is given in the vein (IV).  Takes 30 minutes to infuse.   Possible Side Effects (More Common) . Nausea and throwing up (vomiting). These symptoms may happen within a few hours after your treatment and may last up to 24 hours. Medicines are available to stop or lessen these side effects. . Bone marrow depression. This is a decrease in the number of white blood cells, red blood cells, and platelets. This may raise your risk of infection, make you tired and weak (fatigue), and raise your risk of bleeding. . Soreness of the mouth and throat. You may have red areas, white patches, or sores that hurt. . This drug may affect how your kidneys work. Your kidney function will be checked as needed. . Electrolyte changes. Your blood will be checked for electrolyte changes as needed.  Possible Side Effects (Less Common) . Hair loss. Some patients lose their hair on the scalp and body. You may notice your hair thinning seven to 14 days after getting this drug. . Effects on the nerves are called peripheral neuropathy. You may feel numbness, tingling, or pain in your hands and feet. It may be hard for you to button your clothes, open jars, or walk as usual. The effect on the nerves may get worse with more doses of the drug. These effects get better in some people after the drug is stopped but it does not get better in all people. . Loose  bowel movements (diarrhea) that may last for several days . Decreased hearing or ringing in the ears . Changes in the way food and drinks taste . Changes in liver function. Your liver function will be checked as needed.     Allergic Reactions Serious allergic reactions including anaphylaxis are rare. While you are getting this drug in your vein (IV), tell your nurse right away if you have any of these symptoms of an allergic reaction: . Trouble catching your breath . Feeling like your tongue or throat are swelling . Feeling your heart  beat quickly or in a not normal way (palpitations) . Feeling dizzy or lightheaded . Flushing, itching, rash, and/or hives  Treating Side Effects . Drink 6-8 cups of fluids each day unless your doctor has told you to limit your fluid intake due to some other health problem. A cup is 8 ounces of fluid. If you throw up or have loose bowel movements, you should drink more fluids so that you do not become dehydrated (lack water in the body from losing too much fluid). . Mouth care is very important. Your mouth care should consist of routine, gentle cleaning of your teeth or dentures and rinsing your mouth with a mixture of 1/2 teaspoon of salt in 8 ounces of water or  teaspoon of baking soda in 8 ounces of water. This should be done at least after each meal and at bedtime. . If you have mouth sores, avoid mouthwash that has alcohol. Avoid alcohol and smoking because they can bother your mouth and throat. . If you have numbness and tingling in your hands and feet, be careful when cooking, walking, and handling sharp objects and hot liquids. . Talk with your nurse about getting a wig before you lose your hair. Also, call the [facility] at [phone]-2345 to find out information about the "Look Good, Feel Better" program close to where you live. It is a free program where women getting chemotherapy can learn about wigs, turbans and scarves as well as makeup techniques and skin and nail care.  Food and Drug Interactions There are no known interactions of carboplatin with food. This drug may interact with other medicines. Tell your doctor and pharmacist about all the medicines and dietary supplements (vitamins, minerals, herbs and others) that you are taking at this time. The safety and use of dietary supplements and alternative diets are often not known. Using these might affect your cancer or interfere with your treatment. Until more is known, you should not use dietary supplements or alternative  diets without your cancer doctor's help. When to Call the Doctor Call your doctor or nurse right away if you have any of these symptoms: . Fever of 100.5 F (38 C) or above; chills . Bleeding or bruising that is not normal . Wheezing or trouble breathing . Nausea that stops you from eating or drinking . Throwing up more than once a day . Rash or itching . Loose bowel movements (diarrhea) more than four times a day or diarrhea with weakness or feeling lightheaded . Call your doctor or nurse as soon as possible if any of these symptoms happen: . Numbness, tingling, decreased feeling or weakness in fingers, toes, arms, or legs . Change in hearing, ringing in the ears . Blurred vision or other changes in eyesight . Decreased urine . Yellowing of skin or eyes  Sexual Problems and Reproductive Concerns Sexual problems and reproduction concerns may happen. In both men and women, this drug may  affect your ability to have children. This cannot be determined before your treatment. Talk with your doctor or nurse if you plan to have children. Ask for information on sperm or egg banking. In men, this drug may interfere with your ability to make sperm, but it should not change your ability to have sexual relations. In women, menstrual bleeding may become irregular or stop while you are getting this drug. Do not assume that you cannot become pregnant if you do not have a menstrual period. Women may go through signs of menopause (change of life) like vaginal dryness or itching. Vaginal lubricants can be used to lessen vaginal dryness, itching, and pain during sexual relations. Genetic counseling is available for you to talk about the effects of this drug therapy on future pregnancies. Also, a genetic counselor can look at the possible risk of problems in the unborn baby due to this medicine if an exposure happens during pregnancy. . Pregnancy warning: This drug may have harmful effects on the unborn child, so  effective methods of birth control should be used during your cancer treatment. . Breast feeding warning: It is not known if this drug passes into breast milk. For this reason, women should talk to their doctor about the risks and benefits of breast feeding during treatment with this drug because this drug may enter the breast milk and badly harm a breast feeding baby.   Pemetrexed (Generic Name) Other Names: ALIMTA  About This Drug Pemetrexed is used to treat cancer. It is given in the vein (IV).  Takes 10 minutes to infuse.   Possible Side Effects (More Common) . Bone marrow depression. This is a decrease in the number of white blood cells, red blood cells, and platelets. This may raise your risk of infection, make you tired and weak (fatigue), and raise your risk of bleeding. . Fatigue . Soreness of the mouth and throat. You may have red areas, white patches, or sores that hurt.  Possible Side Effects (less common) . Trouble breathing or feeling short of breath . Nausea and throwing up (vomiting) . Skin rash  Treating Side Effects . Ask your doctor or nurse about medicine that is available to help stop or lessen nausea and throwing up. . If you get a rash, do not put anything on it unless your doctor or nurse says you may. Keep the area around the rash clean and dry. . Mouth care is very important. Your mouth care should consist of routine, gentle cleaning of your teeth or dentures and rinsing your mouth with a mixture of 1/2 teaspoon of salt in 8 ounces of water or  teaspoon of baking soda in 8 ounces of water. This should be done at least after each meal and at bedtime. . If you have mouth sores, avoid mouthwash that has alcohol. Avoid alcohol and smoking because they can bother your mouth and throat.  Food and Drug Interactions There are no known interactions of this medicine with food. Tell your doctor if you are taking ibuprofen. Pemetrexed may interact with other medicines.  Tell your doctor and pharmacist about all the medicines and dietary supplements (vitamins, minerals, herbs and others) that you are taking at this time. The safety and use of dietary supplements and alternative diets are often not known. Using these might affect your cancer or interfere with your treatment. Until more is known, you should not use dietary supplements or alternative diets without your cancer doctor's help.  When to Call the Doctor Call  your doctor or nurse right away if you have any of these symptoms: . Temperature of 100.5 F (38 C) or above . Chills . Easy bruising or bleeding . Trouble breathing . Chest pain . Nausea that stops you from eating or drinking . Throwing up more than 3 times in a day Call your doctor or nurse as soon as possible if you have any of these symptoms: . Rash that does not go away with prescribed medicine . Nausea or vomiting that does not go away with prescribed medicine . Extreme fatigue that interferes with normal activities  Reproduction Concerns . Pregnancy warning: This drug may have harmful effects on the unborn child, so effective methods of birth control should be used during your cancer treatment. . Genetic counseling is available for you to talk about the effects of this drug therapy on future pregnancies. Also, a genetic counselor can look at the possible risk of problems in the unborn baby due to this medicine if an exposure happens during pregnancy. . Breast feeding warning: It is not known if this drug passes into breast milk. For this reason, women should talk to their doctor about the risks and benefits of breast feeding during treatment with this drug because this drug may enter the breast milk and badly harm a breast feeding baby.       SELF CARE ACTIVITIES WHILE ON CHEMOTHERAPY:  Hydration Increase your fluid intake 48 hours prior to treatment and drink at least 8 to 12 cups (64 ounces) of water/decaff beverages per day after  treatment. You can still have your cup of coffee or soda but these beverages do not count as part of your 8 to 12 cups that you need to drink daily. No alcohol intake.  Medications Continue taking your normal prescription medication as prescribed.  If you start any new herbal or new supplements please let us know first to make sure it is safe.  Mouth Care Have teeth cleaned professionally before starting treatment. Keep dentures and partial plates clean. Use soft toothbrush and do not use mouthwashes that contain alcohol. Biotene is a good mouthwash that is available at most pharmacies or may be ordered by calling [phone]. Use warm salt water gargles (1 teaspoon salt per 1 quart warm water) before and after meals and at bedtime. Or you may rinse with 2 tablespoons of three-percent hydrogen peroxide mixed in eight ounces of water. If you are still having problems with your mouth or sores in your mouth please call the clinic. If you need dental work, please let the doctor know before you go for your appointment so that we can coordinate the best possible time for you in regards to your chemo regimen. You need to also let your dentist know that you are actively taking chemo. We may need to do labs prior to your dental appointment.   Skin Care Always use sunscreen that has not expired and with SPF (Sun Protection Factor) of 50 or higher. Wear hats to protect your head from the sun. Remember to use sunscreen on your hands, ears, face, & feet.  Use good moisturizing lotions such as udder cream, eucerin, or even Vaseline. Some chemotherapies can cause dry skin, color changes in your skin and nails.    . Avoid long, hot showers or baths. . Use gentle, fragrance-free soaps and laundry detergent. . Use moisturizers, preferably creams or ointments rather than lotions because the thicker consistency is better at preventing skin dehydration. Apply the cream or ointment  within 15 minutes of showering.  Reapply moisturizer at night, and moisturize your hands every time after you wash them.  Hair Loss (if your doctor says your hair will fall out)  . If your doctor says that your hair is likely to fall out, decide before you begin chemo whether you want to wear a wig. You may want to shop before treatment to match your hair color. . Hats, turbans, and scarves can also camouflage hair loss, although some people prefer to leave their heads uncovered. If you go bare-headed outdoors, be sure to use sunscreen on your scalp. . Cut your hair short. It eases the inconvenience of shedding lots of hair, but it also can reduce the emotional impact of watching your hair fall out. . Don't perm or color your hair during chemotherapy. Those chemical treatments are already damaging to hair and can enhance hair loss. Once your chemo treatments are done and your hair has grown back, it's OK to resume dyeing or perming hair. With chemotherapy, hair loss is almost always temporary. But when it grows back, it may be a different color or texture. In older adults who still had hair color before chemotherapy, the new growth may be completely gray.  Often, new hair is very fine and soft.  Infection Prevention Please wash your hands for at least 30 seconds using warm soapy water. Handwashing is the #1 way to prevent the spread of germs. Stay away from sick people or people who are getting over a cold. If you develop respiratory systems such as green/yellow mucus production or productive cough or persistent cough let us know and we will see if you need an antibiotic. It is a good idea to keep a pair of gloves on when going into grocery stores/[employer] to decrease your risk of coming into contact with germs on the carts, etc. Carry alcohol hand gel with you at all times and use it frequently if out in public. If your temperature reaches 100.5 or higher please call the clinic and let us know.  If it is after hours or on the weekend  please go to the ER if your temperature is over 100.5.  Please have your own personal thermometer at home to use.    Sex and bodily fluids If you are going to have sex, a condom must be used to protect the person that isn't taking chemotherapy. Chemo can decrease your libido (sex drive). For a few days after chemotherapy, chemotherapy can be excreted through your bodily fluids.  When using the toilet please close the lid and flush the toilet twice.  Do this for a few day after you have had chemotherapy.   Effects of chemotherapy on your sex life Some changes are simple and won't last long. They won't affect your sex life permanently. Sometimes you may feel: . too tired . not strong enough to be very active . sick or sore  . not in the mood . anxious or low Your anxiety might not seem related to sex. For example, you may be worried about the cancer and how your treatment is going. Or you may be worried about money, or about how you family are coping with your illness. These things can cause stress, which can affect your interest in sex. It's important to talk to your partner about how you feel. Remember - the changes to your sex life don't usually last long. There's usually no medical reason to stop having sex during chemo. The drugs won't have  any long term physical effects on your performance or enjoyment of sex. Cancer can't be passed on to your partner during sex  Contraception It's important to use reliable contraception during treatment. Avoid getting pregnant while you or your partner are having chemotherapy. This is because the drugs may harm the baby. Sometimes chemotherapy drugs can leave a man or woman infertile.  This means you would not be able to have children in the future. You might want to talk to someone about permanent infertility. It can be very difficult to learn that you may no longer be able to have children. Some people find counselling helpful. There might be ways to  preserve your fertility, although this is easier for men than for women. You may want to speak to a fertility expert. You can talk about sperm banking or harvesting your eggs. You can also ask about other fertility options, such as donor eggs. If you have or have had breast cancer, your doctor might advise you not to take the contraceptive pill. This is because the hormones in it might affect the cancer.  It is not known for sure whether or not chemotherapy drugs can be passed on through semen or secretions from the vagina. Because of this some doctors advise people to use a barrier method if you have sex during treatment. This applies to vaginal, anal or oral sex. Generally, doctors advise a barrier method only for the time you are actually having the treatment and for about a week after your treatment. Advice like this can be worrying, but this does not mean that you have to avoid being intimate with your partner. You can still have close contact with your partner and continue to enjoy sex.  Animals If you have cats or birds we just ask that you not change the litter or change the cage.  Please have someone else do this for you while you are on chemotherapy.   Food Safety During and After Cancer Treatment Food safety is important for people both during and after cancer treatment. Cancer and cancer treatments, such as chemotherapy, radiation therapy, and stem cell/bone marrow transplantation, often weaken the immune system. This makes it harder for your body to protect itself from foodborne illness, also called food poisoning. Foodborne illness is caused by eating food that contains harmful bacteria, parasites, or viruses.  Foods to avoid Some foods have a higher risk of becoming tainted with bacteria. These include: Marland Kitchen Unwashed fresh fruit and vegetables, especially leafy vegetables that can hide dirt and other contaminants . Raw sprouts, such as alfalfa sprouts . Raw or undercooked beef, especially  ground beef, or other raw or undercooked meat and poultry . Fatty, fried, or spicy foods immediately before or after treatment.  These can sit heavy on your stomach and make you feel nauseous. . Raw or undercooked shellfish, such as oysters. . Sushi and sashimi, which often contain raw fish.  . Unpasteurized beverages, such as unpasteurized fruit juices, raw milk, raw yogurt, or cider . Undercooked eggs, such as soft boiled, over easy, and poached; raw, unpasteurized eggs; or foods made with raw egg, such as homemade raw cookie dough and homemade mayonnaise Simple steps for food safety Shop smart. . Do not buy food stored or displayed in an unclean area. . Do not buy bruised or damaged fruits or vegetables. . Do not buy cans that have cracks, dents, or bulges. . Pick up foods that can spoil at the end of your shopping trip and store them  in a cooler on the way home. Prepare and clean up foods carefully. . Rinse all fresh fruits and vegetables under running water, and dry them with a clean towel or paper towel. . Clean the top of cans before opening them. . After preparing food, wash your hands for 20 seconds with hot water and soap. Pay special attention to areas between fingers and under nails. . Clean your utensils and dishes with hot water and soap. Marland Kitchen Disinfect your kitchen and cutting boards using 1 teaspoon of liquid, unscented bleach mixed into 1 quart of water.   Dispose of old food. . Eat canned and packaged food before its expiration date (the "use by" or "best before" date). . Consume refrigerated leftovers within 3 to 4 days. After that time, throw out the food. Even if the food does not smell or look spoiled, it still may be unsafe. Some bacteria, such as Listeria, can grow even on foods stored in the refrigerator if they are kept for too long. Take precautions when eating out. . At restaurants, avoid buffets and salad bars where food sits out for a long time and comes in contact  with many people. Food can become contaminated when someone with a virus, often a norovirus, or another "bug" handles it. . Put any leftover food in a "to-go" container yourself, rather than having the server do it. And, refrigerate leftovers as soon as you get home. . Choose restaurants that are clean and that are willing to prepare your food as you order it cooked.    MEDICATIONS: Folic Acid 1 mg:  Take 1 tablet (1 mg total) by mouth daily.  Dexamethasone 4 mg tablets:  Take 1 tab two times a day the day before Alimta chemo. Take 2 tabs two times a day starting the day after chemo for 3 days.  Take with food.    Zofran/Ondansetron 8mg  tablet. Take 1 tablet every 8 hours as needed for nausea/vomiting. (#1 nausea med to take, this can constipate)  Compazine/Prochlorperazine 10mg  tablet. Take 1 tablet every 6 hours as needed for nausea/vomiting. (#2 nausea med to take, this can make you sleepy)  EMLA cream. Apply a quarter size amount to port site 1 hour prior to chemo. Do not rub in. Cover with plastic wrap.     Over-the-Counter Meds:  Miralax 1 capful in 8 oz of fluid daily. May increase to two times a day if needed. This is a stool softener. If this doesn't work proceed you can add:  Senokot S-start with 1 tablet two times a day and increase to 4 tablets two times a day if needed. (total of 8 tablets in a 24 hour period). This is a stimulant laxative.   Call us if this does not help your bowels move.   Imodium 2mg  capsule. Take 2 capsules after the 1st loose stool and then 1 capsule every 2 hours until you go a total of 12 hours without having a loose stool. Call the [facility] if loose stools continue. If diarrhea occurs @ bedtime, take 2 capsules @ bedtime. Then take 2 capsules every 4 hours until morning. Call [facility].    Diarrhea Sheet  If you are having loose stools/diarrhea, please purchase Imodium and begin taking as outlined:  At the first sign of poorly formed or  loose stools you should begin taking Imodium(loperamide) 2 mg capsules.  Take two caplets (4mg ) followed by one caplet (2mg ) every 2 hours until you have had no diarrhea for 12  hours.  During the night take two caplets (4mg ) at bedtime and continue every 4 hours during the night until the morning.  Stop taking Imodium only after there is no sign of diarrhea for 12 hours.    Always call the [facility] if you are having loose stools/diarrhea that you can't get under control.  Loose stools/disrrhea leads to dehydration (loss of water) in your body.  We have other options of trying to get the loose stools/diarrhea to stopped but you must let us know!     Constipation Sheet *Miralax in 8 oz of fluid daily.  May increase to two times a day if needed.  This is a stool softener.  If this not enough to keep your bowel regular:  You can add:  *Senokot S, start with one tablet twice a day and can increase to 4 tablets twice a day if needed.  This is a stimulant laxative.   Sometimes when you take pain medication you need BOTH a medicine to keep your stool soft and a medicine to help your bowel push it out!  Please call if the above does not work for you.   Do not go more than 2 days without a bowel movement.  It is very important that you do not become constipated.  It will make you feel sick to your stomach (nausea) and can cause abdominal pain and vomiting.      Nausea Sheet  Zofran/Ondansetron 8mg  tablet. Take 1 tablet every 8 hours as needed for nausea/vomiting. (#1 nausea med to take, this can constipate)  Compazine/Prochlorperazine 10mg  tablet. Take 1 tablet every 6 hours as needed for nausea/vomiting. (#2 nausea med to take, this can make you sleepy)  You can take these medications together or separately.  We would first like for you to try the Ondansetron by itself and then take the Prochloperizine if needed. But you are allowed to take both medications at the same time if your nausea  is that severe.  If you are having persistent nausea (nausea that does not stop) please take these medications on a staggered schedule so that the nausea medication stays in your body.  Please call the [facility] and let us know the amount of nausea that you are experiencing.  If you begin to vomit, you need to call the [facility] and if it is the weekend and you have vomited more than one time and cant get it to stop-go to the Emergency Room.  Persistent nausea/vomiting can lead to dehydration (loss of fluid in your body) and will make you feel terrible.   Ice chips, sips of clear liquids, foods that are @ room temperature, crackers, and toast tend to be better tolerated.    SYMPTOMS TO REPORT AS SOON AS POSSIBLE AFTER TREATMENT:  FEVER GREATER THAN 100.5 F  CHILLS WITH OR WITHOUT FEVER  NAUSEA AND VOMITING THAT IS NOT CONTROLLED WITH YOUR NAUSEA MEDICATION  UNUSUAL SHORTNESS OF BREATH  UNUSUAL BRUISING OR BLEEDING  TENDERNESS IN MOUTH AND THROAT WITH OR WITHOUT PRESENCE OF ULCERS  URINARY PROBLEMS  BOWEL PROBLEMS  UNUSUAL RASH    Wear comfortable clothing and clothing appropriate for easy access to any Portacath or PICC line. Let us know if there is anything that we can do to make your therapy better!    What to do if you need assistance after hours or on the weekends: CALL [phone].  HOLD on the line, do not hang up.  You will hear multiple messages  but at the end you will be connected with a nurse triage line.  They will contact the doctor if necessary.  Most of the time they will be able to assist you.  Do not call the hospital operator.     I have been informed and understand all of the instructions given to me and have received a copy. I have been instructed to call the clinic [phone] or my family physician as soon as possible for continued medical care, if indicated. I do not have any more questions at this time but understand that I may call the [facility] or the Patient Navigator at [phone] during office hours should I have questions or need assistance in obtaining follow-up care.

## 2017-03-08 ENCOUNTER — Encounter (HOSPITAL_COMMUNITY): Payer: Self-pay

## 2017-03-08 ENCOUNTER — Ambulatory Visit (HOSPITAL_COMMUNITY)
Admission: RE | Admit: 2017-03-08 | Discharge: 2017-03-08 | Disposition: A | Discharge status: Home / Self Care | Payer: Medicare Other | Source: Ambulatory Visit | Attending: Hematology and Oncology | Admitting: Hematology and Oncology

## 2017-03-08 ENCOUNTER — Other Ambulatory Visit (HOSPITAL_COMMUNITY): Payer: Self-pay | Admitting: Hematology and Oncology

## 2017-03-08 DIAGNOSIS — I10 Essential (primary) hypertension: Secondary | ICD-10-CM | POA: Diagnosis not present

## 2017-03-08 DIAGNOSIS — E119 Type 2 diabetes mellitus without complications: Secondary | ICD-10-CM | POA: Diagnosis not present

## 2017-03-08 DIAGNOSIS — E7800 Pure hypercholesterolemia, unspecified: Secondary | ICD-10-CM | POA: Insufficient documentation

## 2017-03-08 DIAGNOSIS — Z86718 Personal history of other venous thrombosis and embolism: Secondary | ICD-10-CM | POA: Insufficient documentation

## 2017-03-08 DIAGNOSIS — Z85841 Personal history of malignant neoplasm of brain: Secondary | ICD-10-CM | POA: Insufficient documentation

## 2017-03-08 DIAGNOSIS — I7300 Raynaud's syndrome without gangrene: Secondary | ICD-10-CM | POA: Insufficient documentation

## 2017-03-08 DIAGNOSIS — Z79899 Other long term (current) drug therapy: Secondary | ICD-10-CM | POA: Insufficient documentation

## 2017-03-08 DIAGNOSIS — Z5111 Encounter for antineoplastic chemotherapy: Secondary | ICD-10-CM | POA: Diagnosis not present

## 2017-03-08 DIAGNOSIS — Z8673 Personal history of transient ischemic attack (TIA), and cerebral infarction without residual deficits: Secondary | ICD-10-CM | POA: Insufficient documentation

## 2017-03-08 DIAGNOSIS — Z8541 Personal history of malignant neoplasm of cervix uteri: Secondary | ICD-10-CM | POA: Diagnosis not present

## 2017-03-08 DIAGNOSIS — Z7901 Long term (current) use of anticoagulants: Secondary | ICD-10-CM | POA: Insufficient documentation

## 2017-03-08 DIAGNOSIS — C3412 Malignant neoplasm of upper lobe, left bronchus or lung: Secondary | ICD-10-CM

## 2017-03-08 DIAGNOSIS — Z7952 Long term (current) use of systemic steroids: Secondary | ICD-10-CM | POA: Diagnosis not present

## 2017-03-08 DIAGNOSIS — Z7984 Long term (current) use of oral hypoglycemic drugs: Secondary | ICD-10-CM | POA: Insufficient documentation

## 2017-03-08 DIAGNOSIS — Z853 Personal history of malignant neoplasm of breast: Secondary | ICD-10-CM | POA: Insufficient documentation

## 2017-03-08 DIAGNOSIS — C3490 Malignant neoplasm of unspecified part of unspecified bronchus or lung: Secondary | ICD-10-CM | POA: Diagnosis not present

## 2017-03-08 HISTORY — PX: IR FLUORO GUIDE PORT INSERTION RIGHT: IMG5741

## 2017-03-08 HISTORY — PX: IR US GUIDE VASC ACCESS RIGHT: IMG2390

## 2017-03-08 LAB — CBC WITH DIFFERENTIAL/PLATELET
BASOS PCT: 0 %
Basophils Absolute: 0.1 10*3/uL (ref 0.0–0.1)
EOS ABS: 0.2 10*3/uL (ref 0.0–0.7)
EOS PCT: 1 %
HCT: 36.1 % (ref 36.0–46.0)
Hemoglobin: 10.1 g/dL — ABNORMAL LOW (ref 12.0–15.0)
Lymphocytes Relative: 18 %
Lymphs Abs: 3.3 10*3/uL (ref 0.7–4.0)
MCH: 22.1 pg — AB (ref 26.0–34.0)
MCHC: 28 g/dL — AB (ref 30.0–36.0)
MCV: 78.8 fL (ref 78.0–100.0)
MONO ABS: 1.4 10*3/uL — AB (ref 0.1–1.0)
MONOS PCT: 8 %
Neutro Abs: 12.7 10*3/uL — ABNORMAL HIGH (ref 1.7–7.7)
Neutrophils Relative %: 73 %
Platelets: 366 10*3/uL (ref 150–400)
RBC: 4.58 MIL/uL (ref 3.87–5.11)
RDW: 18.5 % — AB (ref 11.5–15.5)
WBC: 17.7 10*3/uL — ABNORMAL HIGH (ref 4.0–10.5)

## 2017-03-08 LAB — BASIC METABOLIC PANEL
Anion gap: 11 (ref 5–15)
BUN: 15 mg/dL (ref 6–20)
CALCIUM: 9.5 mg/dL (ref 8.9–10.3)
CO2: 26 mmol/L (ref 22–32)
CREATININE: 0.63 mg/dL (ref 0.44–1.00)
Chloride: 104 mmol/L (ref 101–111)
GFR calc non Af Amer: >60 mL/min (ref >60)
GLUCOSE: 99 mg/dL (ref 65–99)
Potassium: 3.3 mmol/L — ABNORMAL LOW (ref 3.5–5.1)
Sodium: 141 mmol/L (ref 135–145)

## 2017-03-08 MED ORDER — FENTANYL CITRATE (PF) 100 MCG/2ML IJ SOLN
INTRAMUSCULAR | Status: AC
  Filled 2017-03-08: qty 4

## 2017-03-08 MED ORDER — LIDOCAINE HCL 1 % IJ SOLN
INTRAMUSCULAR | Status: AC | PRN
  Administered 2017-03-08: 15 mL

## 2017-03-08 MED ORDER — CEFAZOLIN SODIUM-DEXTROSE 2-4 GM/100ML-% IV SOLN
INTRAVENOUS | Status: AC
  Administered 2017-03-08: 2 g via INTRAVENOUS
  Filled 2017-03-08: qty 100

## 2017-03-08 MED ORDER — MIDAZOLAM HCL 2 MG/2ML IJ SOLN
INTRAMUSCULAR | Status: AC
  Filled 2017-03-08: qty 4

## 2017-03-08 MED ORDER — MIDAZOLAM HCL 2 MG/2ML IJ SOLN
INTRAMUSCULAR | Status: AC | PRN
  Administered 2017-03-08: 1 mg via INTRAVENOUS

## 2017-03-08 MED ORDER — LIDOCAINE-EPINEPHRINE (PF) 2 %-1:200000 IJ SOLN
INTRAMUSCULAR | Status: AC | PRN
  Administered 2017-03-08: 10 mL

## 2017-03-08 MED ORDER — CEFAZOLIN SODIUM-DEXTROSE 2-4 GM/100ML-% IV SOLN
2.0000 g | INTRAVENOUS | Status: AC
  Administered 2017-03-08: 2 g via INTRAVENOUS

## 2017-03-08 MED ORDER — LIDOCAINE HCL 1 % IJ SOLN
INTRAMUSCULAR | Status: AC
  Filled 2017-03-08: qty 20

## 2017-03-08 MED ORDER — HEPARIN SOD (PORK) LOCK FLUSH 100 UNIT/ML IV SOLN
INTRAVENOUS | Status: AC | PRN
  Administered 2017-03-08: 500 [IU] via INTRAVENOUS

## 2017-03-08 MED ORDER — SODIUM CHLORIDE 0.9 % IV SOLN
INTRAVENOUS | Status: DC
  Administered 2017-03-08: 13:00:00 via INTRAVENOUS

## 2017-03-08 MED ORDER — MIDAZOLAM HCL 5 MG/5ML IJ SOLN
INTRAMUSCULAR | Status: AC | PRN
  Administered 2017-03-08: 1 mg via INTRAVENOUS

## 2017-03-08 MED ORDER — LIDOCAINE-EPINEPHRINE (PF) 2 %-1:200000 IJ SOLN
INTRAMUSCULAR | Status: AC
  Filled 2017-03-08: qty 20

## 2017-03-08 MED ORDER — FENTANYL CITRATE (PF) 100 MCG/2ML IJ SOLN
INTRAMUSCULAR | Status: AC | PRN
  Administered 2017-03-08 (×2): 50 ug via INTRAVENOUS

## 2017-03-08 MED ORDER — HEPARIN SOD (PORK) LOCK FLUSH 100 UNIT/ML IV SOLN
INTRAVENOUS | Status: AC
  Filled 2017-03-08: qty 5

## 2017-03-08 NOTE — Procedures (Signed)
Placement of right jugular portacath.  Tip at SVC/RA junction.  Minimal blood loss and no immediate complication.

## 2017-03-08 NOTE — Discharge Instructions (Signed)
You may remove dressing and bathe in 24 hours.   Do not use EMLA cream for 2 weeks.   Implanted Port Insertion, Care After This sheet gives you information about how to care for yourself after your procedure. Your health care provider may also give you more specific instructions. If you have problems or questions, contact your health care provider. What can I expect after the procedure? After your procedure, it is common to have:  Discomfort at the port insertion site.  Bruising on the skin over the port. This should improve over 3-4 days.  Follow these instructions at home: [facility] care  After your port is placed, you will get a manufacturer's information card. The card has information about your port. Keep this card with you at all times.  Take care of the port as told by your health care provider. Ask your health care provider if you or a family member can get training for taking care of the port at home. A home health care nurse may also take care of the port.  Make sure to remember what type of port you have. Incision care  Follow instructions from your health care provider about how to take care of your port insertion site. Make sure you: ? Wash your hands with soap and water before you change your bandage (dressing). If soap and water are not available, use hand sanitizer. ? Change your dressing as told by your health care provider. ? Leave stitches (sutures), skin glue, or adhesive strips in place. These skin closures may need to stay in place for 2 weeks or longer. If adhesive strip edges start to loosen and curl up, you may trim the loose edges. Do not remove adhesive strips completely unless your health care provider tells you to do that.  Check your port insertion site every day for signs of infection. Check for: ? More redness, swelling, or pain. ? More fluid or blood. ? Warmth. ? Pus or a bad smell. General instructions  Do not take baths, swim, or use a hot tub until  your health care provider approves.  Do not lift anything that is heavier than 10 lb (4.5 kg) for a week, or as told by your health care provider.  Ask your health care provider when it is okay to: ? Return to work or school. ? Resume usual physical activities or sports.  Do not drive for 24 hours if you were given a medicine to help you relax (sedative).  Take over-the-counter and prescription medicines only as told by your health care provider.  Wear a medical alert bracelet in case of an emergency. This will tell any health care providers that you have a port.  Keep all follow-up visits as told by your health care provider. This is important. Contact a health care provider if:  You cannot flush your port with saline as directed, or you cannot draw blood from the port.  You have a fever or chills.  You have more redness, swelling, or pain around your port insertion site.  You have more fluid or blood coming from your port insertion site.  Your port insertion site feels warm to the touch.  You have pus or a bad smell coming from the port insertion site. Get help right away if:  You have chest pain or shortness of breath.  You have bleeding from your port that you cannot control. Summary  Take care of the port as told by your health care provider.  Change  your dressing as told by your health care provider.  Keep all follow-up visits as told by your health care provider. This information is not intended to replace advice given to you by your health care provider. Make sure you discuss any questions you have with your health care provider. Document Released: 11/21/2012 Document Revised: 12/23/2015 Document Reviewed: 12/23/2015 Elsevier Interactive Patient Education  2017 [place] An implanted port is a type of central line that is placed under the skin. Central lines are used to provide IV access when treatment or nutrition needs to be  given through a persons veins. Implanted ports are used for long-term IV access. An implanted port may be placed because:  You need IV medicine that would be irritating to the small veins in your hands or arms.  You need long-term IV medicines, such as antibiotics.  You need IV nutrition for a long period.  You need frequent blood draws for lab tests.  You need dialysis.  Implanted ports are usually placed in the chest area, but they can also be placed in the upper arm, the abdomen, or the leg. An implanted port has two main parts:  Reservoir. The reservoir is round and will appear as a small, raised area under your skin. The reservoir is the part where a needle is inserted to give medicines or draw blood.  Catheter. The catheter is a thin, flexible tube that extends from the reservoir. The catheter is placed into a large vein. Medicine that is inserted into the reservoir goes into the catheter and then into the vein.  How will I care for my incision site? Do not get the incision site wet. Bathe or shower as directed by your health care provider. How is my port accessed? Special steps must be taken to access the port:  Before the port is accessed, a numbing cream can be placed on the skin. This helps numb the skin over the port site.  Your health care provider uses a sterile technique to access the port. ? Your health care provider must put on a mask and sterile gloves. ? The skin over your port is cleaned carefully with an antiseptic and allowed to dry. ? The port is gently pinched between sterile gloves, and a needle is inserted into the port.  Only "non-coring" port needles should be used to access the port. Once the port is accessed, a blood return should be checked. This helps ensure that the port is in the vein and is not clogged.  If your port needs to remain accessed for a constant infusion, a clear (transparent) bandage will be placed over the needle site. The bandage and  needle will need to be changed every week, or as directed by your health care provider.  Keep the bandage covering the needle clean and dry. Do not get it wet. Follow your health care providers instructions on how to take a shower or bath while the port is accessed.  If your port does not need to stay accessed, no bandage is needed over the port.  What is flushing? Flushing helps keep the port from getting clogged. Follow your health care providers instructions on how and when to flush the port. Ports are usually flushed with saline solution or a medicine called heparin. The need for flushing will depend on how the port is used.  If the port is used for intermittent medicines or blood draws, the port will need to be flushed: ?  After medicines have been given. ? After blood has been drawn. ? As part of routine maintenance.  If a constant infusion is running, the port may not need to be flushed.  How long will my port stay implanted? The port can stay in for as long as your health care provider thinks it is needed. When it is time for the port to come out, surgery will be done to remove it. The procedure is similar to the one performed when the port was put in. When should I seek immediate medical care? When you have an implanted port, you should seek immediate medical care if:  You notice a bad smell coming from the incision site.  You have swelling, redness, or drainage at the incision site.  You have more swelling or pain at the port site or the surrounding area.  You have a fever that is not controlled with medicine.  This information is not intended to replace advice given to you by your health care provider. Make sure you discuss any questions you have with your health care provider. Document Released: 01/31/2005 Document Revised: 07/09/2015 Document Reviewed: 10/08/2012 Elsevier Interactive Patient Education  2017 [place]. Moderate Conscious Sedation, Adult, Care  After These instructions provide you with information about caring for yourself after your procedure. Your health care provider may also give you more specific instructions. Your treatment has been planned according to current medical practices, but problems sometimes occur. Call your health care provider if you have any problems or questions after your procedure. What can I expect after the procedure? After your procedure, it is common:  To feel sleepy for several hours.  To feel clumsy and have poor balance for several hours.  To have poor judgment for several hours.  To vomit if you eat too soon.  Follow these instructions at home: For at least 24 hours after the procedure:   Do not: ? Participate in activities where you could fall or become injured. ? Drive. ? Use heavy machinery. ? Drink alcohol. ? Take sleeping pills or medicines that cause drowsiness. ? Make important decisions or sign legal documents. ? Take care of children on your own.  Rest. Eating and drinking  Follow the diet recommended by your health care provider.  If you vomit: ? Drink water, juice, or soup when you can drink without vomiting. ? Make sure you have little or no nausea before eating solid foods. General instructions  Have a responsible adult stay with you until you are awake and alert.  Take over-the-counter and prescription medicines only as told by your health care provider.  If you smoke, do not smoke without supervision.  Keep all follow-up visits as told by your health care provider. This is important. Contact a health care provider if:  You keep feeling nauseous or you keep vomiting.  You feel light-headed.  You develop a rash.  You have a fever. Get help right away if:  You have trouble breathing. This information is not intended to replace advice given to you by your health care provider. Make sure you discuss any questions you have with your health care  provider. Document Released: 11/21/2012 Document Revised: 07/06/2015 Document Reviewed: 05/23/2015 Elsevier Interactive Patient Education  [employer].

## 2017-03-08 NOTE — H&P (Signed)
Referring Physician(s): Perlov,Mikhail G  Supervising Physician: Markus Daft  Patient Status:  [facility] OP  Chief Complaint:  "I'm here for a port"  Subjective: Patient familiar to IR service from right sclerotic bone lesion and right iliac bone marrow biopsy on 02/21/17 which revealed findings consistent with metastatic non-small cell lung cancer.  She presents again today for Port-A-Cath placement for chemotherapy.  She currently denies fever, worsening dyspnea, abdominal pain, nausea, vomiting or bleeding.  She states that her lungs "hurt", with occasional cough and some dyspnea with exertion as well as back pain.  She is currently on Lovenox for left lower extremity DVT with last dose at midnight 1/22. Past Medical History:  Diagnosis Date  . Anemia   . Angina   . Arthritis    Rheumatoid  . Brain cancer ([place])   . Breast cancer ([place])   . Cancer ([place])    left breast, cervical cancer  . Colon polyps   . Constipation   . Diabetes mellitus without complication ([place])   . Diarrhea   . Dysrhythmia   . GERD (gastroesophageal reflux disease)   . Headache   . Heart murmur   . History of kidney stones   . Hypercholesterolemia   . Hypertension   . Lung cancer ([place])   . Numbness and tingling in right hand   . Numbness and tingling of both feet   . Peripheral vascular disease ([place])    cerebrel aneurysm /sp coil  . Pneumonia   . PONV (postoperative nausea and vomiting)   . Raynaud's syndrome   . Rheumatoid arthritis with rheumatoid factor (HCC)   . Right knee DJD   . Stroke [facility])    jan 2012     Allergies: Aurothioglucose [solganal]; Feldene [piroxicam]; Hydralazine; Imdur [isosorbide mononitrate]; Imuran [azathioprine]; Methotrexate derivatives; Calcium channel blockers; Sulfa antibiotics; Thimerosal; Vioxx [rofecoxib]; Arava [leflunomide]; Asacol [mesalamine]; Iohexol; Hydrocodone; Latex; Neomycin; Nickel; Nsaids; and Percocet [oxycodone-acetaminophen]  Medications: Prior to  Admission medications   Medication Sig Start Date End Date Taking? Authorizing Provider  allopurinol (ZYLOPRIM) 300 MG tablet Take 300 mg by mouth daily.    Yes [provider]  atorvastatin (LIPITOR) 20 MG tablet Take 20 mg by mouth at bedtime.   Yes [provider]  Calcium Carbonate-Vit D-Min (CALTRATE 600+D PLUS) 600-400 MG-UNIT per tablet Chew 1 tablet by mouth 2 (two) times daily.     Yes [provider]  Cholecalciferol (VITAMIN D) 2000 UNITS tablet Take 2,000 Units by mouth daily.    Yes [provider]  DULoxetine (CYMBALTA) 60 MG capsule Take 60 mg by mouth daily.   Yes [provider]  enoxaparin (LOVENOX) 60 MG/0.6ML injection INJECT 1 SYRINGE('60MG'$ ) INTO THE SKIN EVERY 12 HOURS FOR 6 TOTAL DOSES FIRST DOSE EVENING ON 01/11 AND LAST DOSE MONDAY MORNING 02/27/17  Yes Perlov, Marinell Blight, MD  hydroxychloroquine (PLAQUENIL) 200 MG tablet Take 200 mg by mouth daily.    Yes [provider]  MAGNESIUM OXIDE PO Take 500 mg by mouth daily.    Yes [provider]  metFORMIN (GLUCOPHAGE-XR) 500 MG 24 hr tablet Take 500 mg by mouth 2 (two) times a week.   Yes [provider]  methylPREDNISolone (MEDROL) 8 MG tablet Take 8 mg by mouth daily.   Yes [provider]  morphine (MS CONTIN) 30 MG 12 hr tablet Take 1 tablet (30 mg total) by mouth every 12 (twelve) hours. 02/27/17 03/29/17 Yes Ardath Sax, MD  morphine 20  MG/5ML solution Take 1 mL (4 mg total) by mouth 2 (two) times daily as needed for pain. Patient taking differently: Take 4 mg by mouth 4 (four) times daily as needed for pain (breakthrough pain).  01/26/17  Yes Brand Males, MD  nebivolol (BYSTOLIC) 5 MG tablet Take 5 mg by mouth every evening.    Yes [provider]  omeprazole (PRILOSEC) 40 MG capsule TAKE (1) CAPSULE BY MOUTH ONCE DAILY. Patient taking differently: TAKE 40 MG BY MOUTH ONCE DAILY. 07/21/14  Yes Rehman, Mechele Dawley, MD  CARBOPLATIN  IV Inject into the vein.    [provider]  Denosumab (XGEVA [place]) Inject into the skin.    [provider]  dexamethasone (DECADRON) 4 MG tablet Take 1 tab two times a day the day before Alimta chemo. Take 2 tabs two times a day starting the day after chemo for 3 days. 02/27/17   Perlov, Marinell Blight, MD  FIBER SELECT GUMMIES PO Take 2 each by mouth 2 (two) times daily.     [provider]  folic acid (FOLVITE) 1 MG tablet Take 1 tablet (1 mg total) by mouth daily. Start 5-7 days before Alimta chemotherapy. Continue until 21 days after Alimta completed. 02/27/17   Ardath Sax, MD  ipratropium (ATROVENT) 0.06 % nasal spray Place 2 sprays into both nostrils 2 (two) times daily as needed for rhinitis.    [provider]  lidocaine-prilocaine (EMLA) cream Apply to affected area once 02/27/17   Perlov, Marinell Blight, MD  LORazepam (ATIVAN) 0.5 MG tablet Take 1 tablet (0.5 mg total) by mouth every 6 (six) hours as needed (Nausea or vomiting). 02/27/17   Ardath Sax, MD  ondansetron (ZOFRAN) 8 MG tablet Take 1 tablet (8 mg total) by mouth 2 (two) times daily as needed for refractory nausea / vomiting. Start on day 3 after chemo. 02/27/17   Ardath Sax, MD  PEMEtrexed Disodium (ALIMTA IV) Inject into the vein.    [provider]  Polyethyl Glycol-Propyl Glycol (SYSTANE) 0.4-0.3 % GEL Apply 1 application to eye daily as needed (for dry eyes).     [provider]  prochlorperazine (COMPAZINE) 10 MG tablet Take 1 tablet (10 mg total) by mouth every 6 (six) hours as needed (Nausea or vomiting). 02/27/17   Ardath Sax, MD  Rivaroxaban 15 & 20 MG TBPK Take as directed on package: Start with one '15mg'$  tablet by mouth twice a day with food. On Day 22, switch to one '20mg'$  tablet once a day with food. 02/24/17   Ardath Sax, MD     Vital Signs: Ht 5' 7.5" (1.715 m)   Wt 160 lb (72.6 kg)   BMI 24.69 kg/m   Blood pressure 146/65, heart rate 63,  respirations 16, O2 sat 100% room air, temp 98.4  Physical Exam patient awake, alert.  Chest with clear breath sounds bilaterally.  Heart with normal rate, occasional ectopy, positive murmur.  Abdomen soft, positive bowel sounds, nontender.  Bilateral lower extremity edema, greater on left.  Imaging: No results found.  Labs:  CBC: Recent Labs    01/25/17 1158 02/21/17 0721 02/24/17 1430 03/08/17 1236  WBC 31.0* 18.5* 19.7* 17.7*  HGB 11.3* 9.8* 10.1* 10.1*  HCT 37.3 33.9* 36.2 36.1  PLT 423.0* 267 288 366    COAGS: Recent Labs    02/21/17 0721  INR 0.99  APTT 26    BMP: Recent Labs    06/15/16 1611 01/25/17 1158 02/24/17 1430  NA 141 141 140  K 3.1* 3.9 3.0*  CL 103 104 103  CO2 '27 28 26  '$ GLUCOSE 159* 208* 158*  BUN 21* 18 11  CALCIUM 10.0 9.7 9.5  CREATININE 0.93 0.83 0.75  GFRNONAA >60  --  >60  GFRAA >60  --  >60    LIVER FUNCTION TESTS: No results for input(s): BILITOT, AST, ALT, ALKPHOS, PROT, ALBUMIN in the last 8760 hours.  Assessment and Plan: Patient with history of newly diagnosed metastatic non-small cell lung cancer; also on Lovenox for left lower extremity DVT-last dose at midnight 1/22; presents today for Port-A-Cath placement for chemotherapy.Risks and benefits discussed with the patient including, but not limited to bleeding, infection, pneumothorax, or fibrin sheath development and need for additional procedures. All of the patient's questions were answered, patient is agreeable to proceed. Consent signed and in chart.     Electronically Signed: D. Rowe Robert, PA-C 03/08/2017, 1:43 PM   I spent a total of  20 minutes at the the patient's bedside AND on the patient's hospital floor or unit, greater than 50% of which was counseling/coordinating care for Port-A-Cath placement

## 2017-03-09 ENCOUNTER — Ambulatory Visit (HOSPITAL_COMMUNITY): Payer: Medicare Other

## 2017-03-09 ENCOUNTER — Telehealth (HOSPITAL_COMMUNITY): Payer: Self-pay | Admitting: Emergency Medicine

## 2017-03-09 NOTE — Telephone Encounter (Signed)
Called pt to see how she was doing.  Pt is not coming in for teaching today she does not feel like it.  She is going to come Monday.  I went over all the medications that we had called in for her that go along with chemotherapy.  I explained that the folic acid is to be taken everyday and go ahead and start taken that one today.  She has a cream for her port that she will not use yet because she has fresh glue on her incision.  She has 3 different nausea medications she can take as needed for nausea.  Ans we went over how to take her steroids.  She will start the day before chemo take 1 tablets two times a days and the days after chemo take 2 tablets two times a day for 3 days.  She did repeat this back to me to make sure she understood the directions correctly.  I explained I would go over the medications in more detail and have a print off for her when she came in for teaching.  She verbalized understanding.

## 2017-03-10 ENCOUNTER — Ambulatory Visit (HOSPITAL_COMMUNITY)
Admission: RE | Admit: 2017-03-10 | Discharge: 2017-03-10 | Disposition: A | Discharge status: Home / Self Care | Payer: Medicare Other | Source: Ambulatory Visit | Attending: Internal Medicine | Admitting: Internal Medicine

## 2017-03-10 DIAGNOSIS — I639 Cerebral infarction, unspecified: Secondary | ICD-10-CM | POA: Insufficient documentation

## 2017-03-10 DIAGNOSIS — I6522 Occlusion and stenosis of left carotid artery: Secondary | ICD-10-CM | POA: Insufficient documentation

## 2017-03-10 DIAGNOSIS — Z9889 Other specified postprocedural states: Secondary | ICD-10-CM | POA: Insufficient documentation

## 2017-03-10 DIAGNOSIS — I6502 Occlusion and stenosis of left vertebral artery: Secondary | ICD-10-CM | POA: Diagnosis not present

## 2017-03-10 DIAGNOSIS — I6523 Occlusion and stenosis of bilateral carotid arteries: Secondary | ICD-10-CM | POA: Diagnosis not present

## 2017-03-12 NOTE — Progress Notes (Signed)
[facility] New Visit:  Assessment: Primary cancer of left upper lobe of lung ([place]) 67 y.o. 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  Currently presenting with widely metastatic malignancy.  Biopsy of the pelvic lesions demonstrates presence of adenocarcinoma which is consistent with pulmonary primary based on presence of CK 7, and TTF-1.  Patient also has developed malignancy-associated DVT and is currently receiving enoxaparin. At this point, Patient is being considered for possible orthopedic surgery for prophylactic fixation of her femoral lesions, but the risk of the surgery may be too significant.  Additionally, the sample obtained during the last biopsy was decalcified rendering it unusable for additional molecular testing with a newly suggested non-small cell lung cancer diagnosis.  Plan: - Consult interventional radiology for port placement as well as repeat bone biopsy to be submitted for molecular diagnosis with foundation 1 testing -Delay planned orthopedic surgery at this time. -Increase the current dose of the long-acting opioid to improve pain control -Return to clinic in 2 weeks with labs, clinic visit, and possible initiation of systemic therapy with carboplatin/pemetrexed with potential addition of pembrolizumab in the future if tumor proved to be PD1 positive or replacement of the systemic chemotherapy with a targeted agent should we discover any targetable mutations in the malignancy.  Delayed initiation of therapy likely result in further morbidity for the patient and is not advised.  Voice recognition software was used and creation of this note. Despite my best effort at editing the text, some misspelling/errors may have occurred.  Orders Placed This Encounter  Procedures  . IR FLUORO GUIDE PORT INSERTION RIGHT    Standing Status:   Future    Number of Occurrences:   1     Standing Expiration Date:   04/28/2018    Order Specific Question:   Reason for Exam (SYMPTOM  OR DIAGNOSIS REQUIRED)    Answer:   Metastatic NSCLC, planning chemotherapy, please time with repeat Bone Bx    Order Specific Question:   Preferred Imaging Location?    Answer:   [facility]  . CT Biopsy    Standing Status:   Future    Standing Expiration Date:   02/27/2018    Order Specific Question:   Lab orders requested (DO NOT place separate lab orders, these will be automatically ordered during procedure specimen collection):    Answer:   Other    Comments:   Do not decalcify -- please send for FoundationOne study -- no need for other path eval at this time    Order Specific Question:   Reason for Exam (SYMPTOM  OR DIAGNOSIS REQUIRED)    Answer:   Metastatic NSCLC, need additional sample for targeted therapy eligibility    Order Specific Question:   Preferred imaging location?    Answer:   [facility]    Order Specific Question:   Radiology Contrast Protocol - do NOT remove file path    Answer:   [email or web address]  . CBC with Differential    Standing Status:   Standing    Number of Occurrences:   20    Standing Expiration Date:   02/28/2018  . Comprehensive metabolic panel    Standing Status:   Standing    Number of Occurrences:   20    Standing Expiration Date:   02/28/2018    All questions were answered.  . The patient knows to call the clinic with  any problems, questions or concerns.  This note was electronically signed.    History of Presenting Illness [name] 67 y.o. presenting to the [facility] for discovery of findings worrisome for metastatic malignancy.  Please see oncological history below for details.  Patient's past medical history significant for history of breast cancer diagnosed in 2004, treated surgically with lumpectomy with DCIS on pathology.  Additionally, history of deep vein thrombosis in the left lower  extremity in December 2017 treated with Xarelto for 6 months.  Currently undergoing treatment with prednisone and Orencia for diagnosis of rheumatoid arthritis.  Her social history is significant for 30-pack-year tobacco smoking, patient quit in 1980s.  No family history of malignancy.    Patient initially presented on January 06, 2017 with interscapular back pain which was new to her.  On 01/12/17, patient had a 5-day episode of hemoptysis that resolved spontaneously.  Since initial presentation, patient had progressive worsening of the back pain.  Initially, plain films of the back were obtained demonstrating an abnormality at T10 which went to additional imaging outlined below.  Patient has not found a biopsy yet, one scheduled from the liver lesions for Friday this week.  At this time, patient denies fevers,  night sweats.  Is complaining of chills and fatigue.  She has been feeling intermittent dizziness, numbness and burning in feet and fingers, generalized weakness, nausea, and swelling in bilateral lower extremities.  She is receiving pain medications with both short-acting and long-acting forms, she rates her pain control is suboptimal with current pain in the lower back and knees 6 out of 10.  Since last visit to the clinic, patient underwent bone biopsy which revealed presence of metastatic adenocarcinoma.  Additionally, patient developed progressive swelling left lower extremity and was found to have recurrent deep vein thrombosis.  Currently, patient is receiving enoxaparin.  Oncological/hematological History: --MRI C Spine, 01/30/17: Abnormality at C3 & T1 --sclerotic lesion T1 with near complete replacement of the vertebral body. --MRI Brain, 01/30/17: 3 subcentimeter lesions concerning for possible metastatic disease. --MRI T/L spine, 02/03/17: Metastatic deposits at multiple levels including previously described T1 lesion.  Bilobed lesion at the T10 approximately 1 cm diameter with central  necrosis.  Additional right pedicle and transverse process lesion at T10 measuring 2.0 cm.  No epidural tumor.  0.5 cm at T12 on the left.  Paravertebral tumor at T5/T6 on the left.  Metastatic deposits in L4 and L5 with almost 50% replacement of L4 without pathological fracture.  No evidence of cord compression.  Right L4 nerve root impingement noted. --PET-CT, 02/06/17: 1.7cm LtUL mass, SUVmax 6.1, Lt perihilar LAD, 2.0cm, SUVmax 9.3. No axillary lymphadenopathy, no abnormal enhancement in the breasts.  2.2 cm lesion in the dome of the liver, SUV max 8.0.  2.2 cm lesion in the caudate lobe of the liver, SUV max 12.8.  Multiple hypermetabolic skeletal lesions including bilateral proximal femoral lesions.   --Bone Bx, 02/21/17: Metastatic adenocarcinoma; IHC -- positive for CK 7, TTF-1, Napsin-A & negative for CK20, CDX-2, GATA3, PAX8 --consistent with pulmonary primary. --Doppler US Lt LE, 02/25/16 positive for recurrent deep vein thrombosis.    Medical History: Past Medical History:  Diagnosis Date  . Anemia   . Angina   . Arthritis    Rheumatoid  . Brain cancer ([place])   . Breast cancer ([place])   . Cancer ([place])    left breast, cervical cancer  . Colon polyps   . Constipation   . Diabetes mellitus without  complication ([place])   . Diarrhea   . Dysrhythmia   . GERD (gastroesophageal reflux disease)   . Headache   . Heart murmur   . History of kidney stones   . Hypercholesterolemia   . Hypertension   . Lung cancer ([place])   . Numbness and tingling in right hand   . Numbness and tingling of both feet   . Peripheral vascular disease ([place])    cerebrel aneurysm /sp coil  . Pneumonia   . PONV (postoperative nausea and vomiting)   . Raynaud's syndrome   . Rheumatoid arthritis with rheumatoid factor (HCC)   . Right knee DJD   . Stroke [facility])    jan 2012    Surgical History: Past Surgical History:  Procedure Laterality Date  . ABDOMINAL HYSTERECTOMY  1977  . APPENDECTOMY  1964  .  BILATERAL OOPHORECTOMY    . CHOLECYSTECTOMY    . COLONOSCOPY  12/09/2010   Procedure: COLONOSCOPY;  Surgeon: Rogene Houston, MD;  Location[facility];  Service: Endoscopy;  Laterality: N/A;  . COLONOSCOPY N/A 06/26/2015   Procedure: COLONOSCOPY;  Surgeon: Rogene Houston, MD;  Location: [facility];  Service: Endoscopy;  Laterality: N/A;  1:05-moved to [address] to notify pt  . ESOPHAGOGASTRODUODENOSCOPY N/A 06/26/2015   Procedure: ESOPHAGOGASTRODUODENOSCOPY (EGD);  Surgeon: Rogene Houston, MD;  Location: [facility];  Service: Endoscopy;  Laterality: N/A;  . EYE SURGERY     cataract surgery bilat   . GIVENS CAPSULE STUDY N/A 08/20/2015   Procedure: GIVENS CAPSULE STUDY;  Surgeon: Rogene Houston, MD;  Location: [facility];  Service: Endoscopy;  Laterality: N/A;  730  . IR FLUORO GUIDE PORT INSERTION RIGHT  03/08/2017  . IR US GUIDE VASC ACCESS RIGHT  03/08/2017  . left breast lumpectomy  2004  . left knee arthroscopy    . Right brain aneurysm  04/2010   Coiled   . right wrist surgery  1999   synevectomy  . TOTAL KNEE ARTHROPLASTY Right 02/25/2013   Procedure: TOTAL KNEE ARTHROPLASTY;  Surgeon: Lorn Junes, MD;  Location: [facility];  Service: Orthopedics;  Laterality: Right;    Family History: Family History  Problem Relation Age of Onset  . Hypertension Father   . Heart attack Father   . Heart disease Maternal Uncle   . Cancer Maternal Aunt        breast  . Cancer Other        kidney  . Cancer Cousin   . Colon cancer Neg Hx     Social History: Social History   Socioeconomic History  . Marital status: Married    Spouse name: Hedy Camara  . Number of children: o  . Years of education: 78  . Highest education level: Not on file  Social Needs  . Financial resource strain: Not on file  . Food insecurity - worry: Not on file  . Food insecurity - inability: Not on file  . Transportation needs - medical: Not on file  . Transportation needs - non-medical: Not on file   Occupational History  . Occupation: retired    Fish farm manager: Development worker, [employer]: retired january 2017  Tobacco Use  . Smoking status: Former Smoker    Packs/day: 2.00    Years: 15.00    Pack years: 30.00    Last attempt to quit: 02/20/1984    Years since quitting: 33.0  . Smokeless tobacco: Never Used  . Tobacco comment:  Quit 1986  Substance and Sexual Activity  . Alcohol use: No  . Drug use: No  . Sexual activity: Not Currently  Other Topics Concern  . Not on file  Social History Narrative   Employed with [employer]. Retired January 2017. Married with no children. Quit smoking 1986. No alcohol or illicit drug use.    Right handed    Some college.   Caffeine one cup of tea daily.    Allergies: Allergies  Allergen Reactions  . Aurothioglucose [Solganal] Other (See Comments)    REACTION: VASCULITIS   . Feldene [Piroxicam] Anaphylaxis and Other (See Comments)    SKIN BLISTERS  . Hydralazine Other (See Comments)    REACTION: Chest pains and headaches: IV only  . Imdur [Isosorbide Mononitrate] Other (See Comments)    REACTION: PATIENT IS UNABLE TO WALK OR FUNCTION  . Imuran [Azathioprine] Other (See Comments)    Could not walk or function  . Methotrexate Derivatives Other (See Comments)    REACTION: VASCULITIS  . Calcium Channel Blockers Palpitations  . Sulfa Antibiotics Diarrhea and Nausea And Vomiting  . Thimerosal Other (See Comments)    SKIN BLISTERED  . Vioxx [Rofecoxib] Other (See Comments)    REACTION: CAUSED MORE JOINT PAIN  . Arava [Leflunomide]     UNSPECIFIED REACTION   . Asacol [Mesalamine]     UNSPECIFIED REACTION   . Iohexol Other (See Comments)    PT STATES SHE WAS TOLD NEVER TO HAVE CT CONTRAST AGAIN.  SHE HAD SOME KIND OF REACTION BUT DOESNT REMEBER WHAT HAPPENED   . Hydrocodone Itching  . Latex Rash  . Neomycin Rash  . Nickel Rash  . Nsaids Other (See Comments)    GI UPSET , CAN TOLERATE SOME NSAIDS  . Percocet  [Oxycodone-Acetaminophen] Other (See Comments)    INSOMNIA    Medications:  Current Outpatient Medications  Medication Sig Dispense Refill  . allopurinol (ZYLOPRIM) 300 MG tablet Take 300 mg by mouth daily.     Marland Kitchen atorvastatin (LIPITOR) 20 MG tablet Take 20 mg by mouth at bedtime.    . Calcium Carbonate-Vit D-Min (CALTRATE 600+D PLUS) 600-400 MG-UNIT per tablet Chew 1 tablet by mouth 2 (two) times daily.      Marland Kitchen CARBOPLATIN IV Inject into the vein.    . Cholecalciferol (VITAMIN D) 2000 UNITS tablet Take 2,000 Units by mouth daily.     . Denosumab (XGEVA [place]) Inject into the skin.    . DULoxetine (CYMBALTA) 60 MG capsule Take 60 mg by mouth daily.    Marland Kitchen FIBER SELECT GUMMIES PO Take 2 each by mouth 2 (two) times daily.     . hydroxychloroquine (PLAQUENIL) 200 MG tablet Take 200 mg by mouth daily.     Marland Kitchen ipratropium (ATROVENT) 0.06 % nasal spray Place 2 sprays into both nostrils 2 (two) times daily as needed for rhinitis.    Marland Kitchen MAGNESIUM OXIDE PO Take 500 mg by mouth daily.     . metFORMIN (GLUCOPHAGE-XR) 500 MG 24 hr tablet Take 500 mg by mouth 2 (two) times a week.    . methylPREDNISolone (MEDROL) 8 MG tablet Take 8 mg by mouth daily.    Marland Kitchen morphine (MS CONTIN) 30 MG 12 hr tablet Take 1 tablet (30 mg total) by mouth every 12 (twelve) hours. 60 tablet 0  . morphine 20 MG/5ML solution Take 1 mL (4 mg total) by mouth 2 (two) times daily as needed for pain. (Patient taking differently: Take 4 mg by  mouth 4 (four) times daily as needed for pain (breakthrough pain). ) 60 mL 0  . nebivolol (BYSTOLIC) 5 MG tablet Take 5 mg by mouth every evening.     Marland Kitchen omeprazole (PRILOSEC) 40 MG capsule TAKE (1) CAPSULE BY MOUTH ONCE DAILY. (Patient taking differently: TAKE 40 MG BY MOUTH ONCE DAILY.) 30 capsule 11  . PEMEtrexed Disodium (ALIMTA IV) Inject into the vein.    Vladimir Faster Glycol-Propyl Glycol (SYSTANE) 0.4-0.3 % GEL Apply 1 application to eye daily as needed (for dry eyes).     . Rivaroxaban 15 & 20 MG TBPK  Take as directed on package: Start with one '15mg'$  tablet by mouth twice a day with food. On Day 22, switch to one '20mg'$  tablet once a day with food. 51 each 0  . dexamethasone (DECADRON) 4 MG tablet Take 1 tab two times a day the day before Alimta chemo. Take 2 tabs two times a day starting the day after chemo for 3 days. 30 tablet 1  . enoxaparin (LOVENOX) 60 MG/0.6ML injection INJECT 1 SYRINGE('60MG'$ ) INTO THE SKIN EVERY 12 HOURS FOR 6 TOTAL DOSES FIRST DOSE EVENING ON 01/11 AND LAST DOSE MONDAY MORNING 3.6 mL 0  . folic acid (FOLVITE) 1 MG tablet Take 1 tablet (1 mg total) by mouth daily. Start 5-7 days before Alimta chemotherapy. Continue until 21 days after Alimta completed. 100 tablet 3  . lidocaine-prilocaine (EMLA) cream Apply to affected area once 30 g 3  . LORazepam (ATIVAN) 0.5 MG tablet Take 1 tablet (0.5 mg total) by mouth every 6 (six) hours as needed (Nausea or vomiting). 30 tablet 0  . ondansetron (ZOFRAN) 8 MG tablet Take 1 tablet (8 mg total) by mouth 2 (two) times daily as needed for refractory nausea / vomiting. Start on day 3 after chemo. 30 tablet 1  . prochlorperazine (COMPAZINE) 10 MG tablet Take 1 tablet (10 mg total) by mouth every 6 (six) hours as needed (Nausea or vomiting). 30 tablet 1   No current facility-administered medications for this visit.     Review of Systems: Review of Systems  Respiratory: Positive for hemoptysis.   Musculoskeletal: Positive for back pain.  All other systems reviewed and are negative.    PHYSICAL EXAMINATION Blood pressure (!) 152/52, pulse 80, temperature 98.2 F (36.8 C), temperature source Oral, resp. rate 16, height 5' 7.5" (1.715 m), weight 163 lb (73.9 kg), SpO2 100 %.  ECOG PERFORMANCE STATUS: 1 - Symptomatic but completely ambulatory  Physical Exam  Constitutional: She is oriented to person, place, and time and well-developed, well-nourished, and in no distress. No distress.  HENT:  Head: Normocephalic and atraumatic.   Mouth/Throat: Oropharynx is clear and moist. No oropharyngeal exudate.  Eyes: Conjunctivae and EOM are normal. Pupils are equal, round, and reactive to light. No scleral icterus.  Neck: No thyromegaly present.  Cardiovascular: Normal rate, regular rhythm, normal heart sounds and intact distal pulses.  No murmur heard. Pulmonary/Chest: Effort normal and breath sounds normal. No respiratory distress. She has no wheezes. She has no rales.  Abdominal: Soft. Bowel sounds are normal. She exhibits no distension. There is no tenderness. There is no rebound and no guarding.  Musculoskeletal: She exhibits no edema.  Lymphadenopathy:    She has no cervical adenopathy.  Neurological: She is alert and oriented to person, place, and time. She has normal reflexes. No cranial nerve deficit.  Skin: Skin is warm and dry. No rash noted. She is not diaphoretic. No erythema.  LABORATORY DATA: I have personally reviewed the data as listed: Hospital Outpatient Visit on 02/24/2017  Component Date Value Ref Range Status  . MRSA, PCR 02/24/2017 NEGATIVE  NEGATIVE Final  . Staphylococcus aureus 02/24/2017 NEGATIVE  NEGATIVE Final   Comment: (NOTE) The Xpert SA Assay (FDA approved for NASAL specimens in patients 46 years of age and older), is one component of a comprehensive surveillance program. It is not intended to diagnose infection nor to guide or monitor treatment.   . Glucose-Capillary 02/24/2017 120* 65 - 99 mg/dL Final  . Sodium 02/24/2017 140  135 - 145 mmol/L Final  . Potassium 02/24/2017 3.0* 3.5 - 5.1 mmol/L Final  . Chloride 02/24/2017 103  101 - 111 mmol/L Final  . CO2 02/24/2017 26  22 - 32 mmol/L Final  . Glucose, Bld 02/24/2017 158* 65 - 99 mg/dL Final  . BUN 02/24/2017 11  6 - 20 mg/dL Final  . Creatinine, Ser 02/24/2017 0.75  0.44 - 1.00 mg/dL Final  . Calcium 02/24/2017 9.5  8.9 - 10.3 mg/dL Final  . GFR calc non Af Amer 02/24/2017 >60  >60 mL/min Final  . GFR calc Af Amer  02/24/2017 >60  >60 mL/min Final   Comment: (NOTE) The eGFR has been calculated using the CKD EPI equation. This calculation has not been validated in all clinical situations. eGFR's persistently <60 mL/min signify possible Chronic Kidney Disease.   . Anion gap 02/24/2017 11  5 - 15 Final  . WBC 02/24/2017 19.7* 4.0 - 10.5 K/uL Final  . RBC 02/24/2017 4.56  3.87 - 5.11 MIL/uL Final  . Hemoglobin 02/24/2017 10.1* 12.0 - 15.0 g/dL Final  . HCT 02/24/2017 36.2  36.0 - 46.0 % Final  . MCV 02/24/2017 79.4  78.0 - 100.0 fL Final  . MCH 02/24/2017 22.1* 26.0 - 34.0 pg Final  . MCHC 02/24/2017 27.9* 30.0 - 36.0 g/dL Final  . RDW 02/24/2017 18.4* 11.5 - 15.5 % Final  . Platelets 02/24/2017 288  150 - 400 K/uL Final  . Hgb A1c MFr Bld 02/24/2017 6.9* 4.8 - 5.6 % Final   Comment: (NOTE) Pre diabetes:          5.7%-6.4% Diabetes:              >6.4% Glycemic control for   <7.0% adults with diabetes   . Mean Plasma Glucose 02/24/2017 151.33  mg/dL Final  Hospital Outpatient Visit on 02/21/2017  Component Date Value Ref Range Status  . Glucose-Capillary 02/21/2017 88  65 - 99 mg/dL Final  Hospital Outpatient Visit on 02/21/2017  Component Date Value Ref Range Status  . WBC 02/21/2017 18.5* 4.0 - 10.5 K/uL Final  . RBC 02/21/2017 4.30  3.87 - 5.11 MIL/uL Final  . Hemoglobin 02/21/2017 9.8* 12.0 - 15.0 g/dL Final  . HCT 02/21/2017 33.9* 36.0 - 46.0 % Final  . MCV 02/21/2017 78.8  78.0 - 100.0 fL Final  . MCH 02/21/2017 22.8* 26.0 - 34.0 pg Final  . MCHC 02/21/2017 28.9* 30.0 - 36.0 g/dL Final  . RDW 02/21/2017 18.3* 11.5 - 15.5 % Final  . Platelets 02/21/2017 267  150 - 400 K/uL Final  . Neutrophils Relative % 02/21/2017 78  % Final  . Neutro Abs 02/21/2017 14.4* 1.7 - 7.7 K/uL Final  . Lymphocytes Relative 02/21/2017 11  % Final  . Lymphs Abs 02/21/2017 2.1  0.7 - 4.0 K/uL Final  . Monocytes Relative 02/21/2017 10  % Final  . Monocytes Absolute 02/21/2017 1.8* 0.1 - 1.0  K/uL Final  .  Eosinophils Relative 02/21/2017 1  % Final  . Eosinophils Absolute 02/21/2017 0.1  0.0 - 0.7 K/uL Final  . Basophils Relative 02/21/2017 0  % Final  . Basophils Absolute 02/21/2017 0.0  0.0 - 0.1 K/uL Final  . aPTT 02/21/2017 26  24 - 36 seconds Final  . Prothrombin Time 02/21/2017 13.0  11.4 - 15.2 seconds Final  . INR 02/21/2017 0.99   Final  . Chromosome-Routine 02/21/2017 SEE SEPARATE REPORT   Final   Performed at [place], [place]

## 2017-03-12 NOTE — Assessment & Plan Note (Signed)
67 y.o. 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  Currently presenting with widely metastatic malignancy.  Biopsy of the pelvic lesions demonstrates presence of adenocarcinoma which is consistent with pulmonary primary based on presence of CK 7, and TTF-1.  Patient also has developed malignancy-associated DVT and is currently receiving enoxaparin. At this point, Patient is being considered for possible orthopedic surgery for prophylactic fixation of her femoral lesions, but the risk of the surgery may be too significant.  Additionally, the sample obtained during the last biopsy was decalcified rendering it unusable for additional molecular testing with a newly suggested non-small cell lung cancer diagnosis.  Plan: - Consult interventional radiology for port placement as well as repeat bone biopsy to be submitted for molecular diagnosis with foundation 1 testing -Delay planned orthopedic surgery at this time. -Increase the current dose of the long-acting opioid to improve pain control -Return to clinic in 2 weeks with labs, clinic visit, and possible initiation of systemic therapy with carboplatin/pemetrexed with potential addition of pembrolizumab in the future if tumor proved to be PD1 positive or replacement of the systemic chemotherapy with a targeted agent should we discover any targetable mutations in the malignancy.  Delayed initiation of therapy likely result in further morbidity for the patient and is not advised.

## 2017-03-13 ENCOUNTER — Encounter (HOSPITAL_COMMUNITY): Payer: Self-pay

## 2017-03-13 ENCOUNTER — Inpatient Hospital Stay (HOSPITAL_BASED_OUTPATIENT_CLINIC_OR_DEPARTMENT_OTHER): Payer: Medicare Other | Admitting: Internal Medicine

## 2017-03-13 ENCOUNTER — Telehealth: Payer: Self-pay | Admitting: Internal Medicine

## 2017-03-13 ENCOUNTER — Inpatient Hospital Stay (HOSPITAL_COMMUNITY): Payer: Medicare Other

## 2017-03-13 ENCOUNTER — Inpatient Hospital Stay (HOSPITAL_COMMUNITY): Payer: Medicare Other | Attending: Internal Medicine

## 2017-03-13 ENCOUNTER — Ambulatory Visit
Admission: RE | Admit: 2017-03-13 | Discharge: 2017-03-13 | Disposition: A | Discharge status: Home / Self Care | Payer: Medicare Other | Source: Ambulatory Visit | Attending: Radiation Oncology | Admitting: Radiation Oncology

## 2017-03-13 VITALS — BP 128/49 | HR 67 | Resp 16 | Ht 67.5 in | Wt 166.5 lb

## 2017-03-13 DIAGNOSIS — C7951 Secondary malignant neoplasm of bone: Secondary | ICD-10-CM | POA: Diagnosis not present

## 2017-03-13 DIAGNOSIS — C3412 Malignant neoplasm of upper lobe, left bronchus or lung: Secondary | ICD-10-CM

## 2017-03-13 DIAGNOSIS — I63232 Cerebral infarction due to unspecified occlusion or stenosis of left carotid arteries: Secondary | ICD-10-CM

## 2017-03-13 MED ORDER — CYANOCOBALAMIN 1000 MCG/ML IJ SOLN
INTRAMUSCULAR | Status: AC
  Filled 2017-03-13: qty 1

## 2017-03-13 MED ORDER — CYANOCOBALAMIN 1000 MCG/ML IJ SOLN
1000.0000 ug | Freq: Once | INTRAMUSCULAR | Status: AC
  Administered 2017-03-13: 1000 ug via INTRAMUSCULAR

## 2017-03-13 NOTE — Progress Notes (Signed)
[name] presents today for injection per the provider's orders.  Vitamin B12 injection administration without incident; see MAR for injection details.  Patient tolerated procedure well and without incident.  No questions or complaints noted at this time.

## 2017-03-13 NOTE — Progress Notes (Signed)
[facility] [place], [place] [address] [phone]   Interval Evaluation  Date of Service: 03/13/17 Patient Name: [name] Patient MRN: [id number] Patient DOB: 09-18-1950 Provider: Ventura Sellers, MD  Identifying Statement:  [name] is a 67 y.o. female with Cerebrovascular accident (CVA) due to stenosis of left carotid artery [facility]) [I63.232]   Primary Cancer: stage IV non small cell lung cancer of the left upper lobe  Interval History:  [name] presents today for stroke follow up after MRA head/neck. She describes no new or progressive neurologic complaints.  She is ambulating with walker, or using wheelchair secondary to pain with weight bearing.  There is no limitation due to weakness or sensory deficit.  She will start chemotherapy tomorrow at [facility] [name] and pemetrexed).    Medications: Current Outpatient Medications on File Prior to Visit  Medication Sig Dispense Refill  . allopurinol (ZYLOPRIM) 300 MG tablet Take 300 mg by mouth daily.     Marland Kitchen atorvastatin (LIPITOR) 20 MG tablet Take 20 mg by mouth at bedtime.    . Calcium Carbonate-Vit D-Min (CALTRATE 600+D PLUS) 600-400 MG-UNIT per tablet Chew 1 tablet by mouth 2 (two) times daily.      . Cholecalciferol (VITAMIN D) 2000 UNITS tablet Take 2,000 Units by mouth daily.     Marland Kitchen dexamethasone (DECADRON) 4 MG tablet Take 1 tab two times a day the day before Alimta chemo. Take 2 tabs two times a day starting the day after chemo for 3 days. 30 tablet 1  . DULoxetine (CYMBALTA) 60 MG capsule Take 60 mg by mouth daily.    Marland Kitchen FIBER SELECT GUMMIES PO Take 2 each by mouth 2 (two) times daily.     . folic acid (FOLVITE) 1 MG tablet Take 1 tablet (1 mg total) by mouth daily. Start 5-7 days before Alimta chemotherapy. Continue until 21 days after Alimta completed. 100 tablet 3  . hydroxychloroquine (PLAQUENIL) 200 MG tablet Take 200 mg by mouth daily.     Marland Kitchen  ipratropium (ATROVENT) 0.06 % nasal spray Place 2 sprays into both nostrils 2 (two) times daily as needed for rhinitis.    Marland Kitchen lidocaine-prilocaine (EMLA) cream Apply to affected area once 30 g 3  . LORazepam (ATIVAN) 0.5 MG tablet Take 1 tablet (0.5 mg total) by mouth every 6 (six) hours as needed (Nausea or vomiting). 30 tablet 0  . MAGNESIUM OXIDE PO Take 500 mg by mouth daily.     . metFORMIN (GLUCOPHAGE-XR) 500 MG 24 hr tablet Take 500 mg by mouth 2 (two) times a week.    . methylPREDNISolone (MEDROL) 8 MG tablet Take 8 mg by mouth daily.    Marland Kitchen morphine (MS CONTIN) 30 MG 12 hr tablet Take 1 tablet (30 mg total) by mouth every 12 (twelve) hours. 60 tablet 0  . morphine 20 MG/5ML solution Take 1 mL (4 mg total) by mouth 2 (two) times daily as needed for pain. (Patient taking differently: Take 4 mg by mouth 4 (four) times daily as needed for pain (breakthrough pain). ) 60 mL 0  . nebivolol (BYSTOLIC) 5 MG tablet Take 5 mg by mouth every evening.     Marland Kitchen omeprazole (PRILOSEC) 40 MG capsule TAKE (1) CAPSULE BY MOUTH ONCE DAILY. (Patient taking differently: TAKE 40 MG BY MOUTH ONCE DAILY.) 30 capsule 11  . ondansetron (ZOFRAN) 8 MG tablet Take 1 tablet (8 mg total) by mouth 2 (  two) times daily as needed for refractory nausea / vomiting. Start on day 3 after chemo. 30 tablet 1  . Polyethyl Glycol-Propyl Glycol (SYSTANE) 0.4-0.3 % GEL Apply 1 application to eye daily as needed (for dry eyes).     . prochlorperazine (COMPAZINE) 10 MG tablet Take 1 tablet (10 mg total) by mouth every 6 (six) hours as needed (Nausea or vomiting). 30 tablet 1  . XARELTO 20 MG TABS tablet Take 1 tablet by mouth daily.     No current facility-administered medications on file prior to visit.     Allergies:  Allergies  Allergen Reactions  . Aurothioglucose [Solganal] Other (See Comments)    REACTION: VASCULITIS   . Feldene [Piroxicam] Anaphylaxis and Other (See Comments)    SKIN BLISTERS  . Hydralazine Other (See Comments)     REACTION: Chest pains and headaches: IV only  . Imdur [Isosorbide Mononitrate] Other (See Comments)    REACTION: PATIENT IS UNABLE TO WALK OR FUNCTION  . Imuran [Azathioprine] Other (See Comments)    Could not walk or function  . Methotrexate Derivatives Other (See Comments)    REACTION: VASCULITIS  . Calcium Channel Blockers Palpitations  . Sulfa Antibiotics Diarrhea and Nausea And Vomiting  . Thimerosal Other (See Comments)    SKIN BLISTERED  . Vioxx [Rofecoxib] Other (See Comments)    REACTION: CAUSED MORE JOINT PAIN  . Arava [Leflunomide]     UNSPECIFIED REACTION   . Asacol [Mesalamine]     UNSPECIFIED REACTION   . Iohexol Other (See Comments)    PT STATES SHE WAS TOLD NEVER TO HAVE CT CONTRAST AGAIN.  SHE HAD SOME KIND OF REACTION BUT DOESNT REMEBER WHAT HAPPENED   . Hydrocodone Itching  . Latex Rash  . Neomycin Rash  . Nickel Rash  . Nsaids Other (See Comments)    GI UPSET , CAN TOLERATE SOME NSAIDS  . Percocet [Oxycodone-Acetaminophen] Other (See Comments)    INSOMNIA   Past Medical History:  Past Medical History:  Diagnosis Date  . Anemia   . Angina   . Arthritis    Rheumatoid  . Brain cancer ([place])   . Breast cancer ([place])   . Cancer ([place])    left breast, cervical cancer  . Colon polyps   . Constipation   . Diabetes mellitus without complication ([place])   . Diarrhea   . Dysrhythmia   . GERD (gastroesophageal reflux disease)   . Headache   . Heart murmur   . History of kidney stones   . Hypercholesterolemia   . Hypertension   . Lung cancer ([place])   . Numbness and tingling in right hand   . Numbness and tingling of both feet   . Peripheral vascular disease ([place])    cerebrel aneurysm /sp coil  . Pneumonia   . PONV (postoperative nausea and vomiting)   . Raynaud's syndrome   . Rheumatoid arthritis with rheumatoid factor (HCC)   . Right knee DJD   . Stroke [facility])    jan 2012   Past Surgical History:  Past Surgical History:  Procedure Laterality Date   . ABDOMINAL HYSTERECTOMY  1977  . APPENDECTOMY  1964  . BILATERAL OOPHORECTOMY    . CHOLECYSTECTOMY    . COLONOSCOPY  12/09/2010   Procedure: COLONOSCOPY;  Surgeon: Rogene Houston, MD;  [facility];  Service: Endoscopy;  Laterality: N/A;  . COLONOSCOPY N/A 06/26/2015   Procedure: COLONOSCOPY;  Surgeon: Rogene Houston, MD;  [facility];  Service: Endoscopy;  Laterality: N/A;  1:05-moved to [address] to notify pt  . ESOPHAGOGASTRODUODENOSCOPY N/A 06/26/2015   Procedure: ESOPHAGOGASTRODUODENOSCOPY (EGD);  Surgeon: Rogene Houston, MD;  [facility];  Service: Endoscopy;  Laterality: N/A;  . EYE SURGERY     cataract surgery bilat   . GIVENS CAPSULE STUDY N/A 08/20/2015   Procedure: GIVENS CAPSULE STUDY;  Surgeon: Rogene Houston, MD;  [facility];  Service: Endoscopy;  Laterality: N/A;  730  . IR FLUORO GUIDE PORT INSERTION RIGHT  03/08/2017  . IR US GUIDE VASC ACCESS RIGHT  03/08/2017  . left breast lumpectomy  2004  . left knee arthroscopy    . Right brain aneurysm  04/2010   Coiled   . right wrist surgery  1999   synevectomy  . TOTAL KNEE ARTHROPLASTY Right 02/25/2013   Procedure: TOTAL KNEE ARTHROPLASTY;  Surgeon: Lorn Junes, MD;  Location: [place];  Service: Orthopedics;  Laterality: Right;   Social History:  Social History   Socioeconomic History  . Marital status: Married    Spouse name: Hedy Camara  . Number of children: o  . Years of education: 2  . Highest education level: Not on file  Social Needs  . Financial resource strain: Not on file  . Food insecurity - worry: Not on file  . Food insecurity - inability: Not on file  . Transportation needs - medical: Not on file  . Transportation needs - non-medical: Not on file  Occupational History  . 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 Smoking status: Former Smoker    Packs/day: 2.00    Years: 15.00    Pack years:  30.00    Last attempt to quit: 02/20/1984    Years since quitting: 33.0  . Smokeless tobacco: Never Used  . Tobacco comment: Quit 1986  Substance and Sexual Activity  . Alcohol use: No  . Drug use: No  . Sexual activity: Not Currently  Other Topics Concern  . Not on file  Social History Narrative   Employed with [employer]. Retired January 2017. Married with no children. Quit smoking 1986. No alcohol or illicit drug use.    Right handed    Some college.   Caffeine one cup of tea daily.   Family History:  Family History  Problem Relation Age of Onset  . Hypertension Father   . Heart attack Father   . Heart disease Maternal Uncle   . Cancer Maternal Aunt        breast  . Cancer Other        kidney  . Cancer Cousin   . Colon cancer Neg Hx     Review of Systems: Constitutional: Denies fevers, chills or abnormal weight loss Eyes: Denies blurriness of vision Ears, nose, mouth, throat, and face: Denies mucositis or sore throat Respiratory: Denies cough, dyspnea or wheezes Cardiovascular: Denies palpitation, chest discomfort or lower extremity swelling Gastrointestinal:  Denies nausea, constipation, diarrhea GU: Denies dysuria or incontinence Skin: Denies abnormal skin rashes Neurological: Per HPI Musculoskeletal: Extremity pain as noted Behavioral/Psych: Denies anxiety, disturbance in thought content, and mood instability   Physical Exam: Vitals:   03/13/17 0949  BP: (!) 128/49  Pulse: 67  Resp: 16  SpO2: 98%   KPS: 70. General: Alert, cooperative, pleasant, in no acute distress Head: Normal EENT: No conjunctival injection or scleral icterus. Oral mucosa  moist Lungs: Resp effort normal Cardiac: Regular rate and rhythm Abdomen: Soft, non-distended abdomen Skin: No rashes cyanosis or petechiae. Extremities: No clubbing or edema  Neurologic Exam: Mental Status: Awake, alert, attentive to examiner. Oriented to self and environment. Language is  fluent with intact comprehension.  Cranial Nerves: Visual acuity is grossly normal. Visual fields are full. Extra-ocular movements intact. No ptosis. Face is symmetric, tongue midline. Motor: Tone and bulk are normal. Power is full in both arms and legs. Reflexes are symmetric, no pathologic reflexes present. Intact finger to nose bilaterally Sensory: Intact to light touch and temperature Gait: Deferred   Labs: I have reviewed the data as listed    Component Value Date/Time   NA 141 03/08/2017 1236   K 3.3 (L) 03/08/2017 1236   CL 104 03/08/2017 1236   CO2 26 03/08/2017 1236   GLUCOSE 99 03/08/2017 1236   BUN 15 03/08/2017 1236   CREATININE 0.63 03/08/2017 1236   CREATININE 1.00 (H) 02/04/2016 1440   CALCIUM 9.5 03/08/2017 1236   PROT 6.1 (L) 02/09/2016 2009   ALBUMIN 3.4 (L) 02/09/2016 2009   AST 32 02/09/2016 2009   ALT 49 02/09/2016 2009   ALKPHOS 91 02/09/2016 2009   BILITOT 0.6 02/09/2016 2009   GFRNONAA >60 03/08/2017 1236   GFRAA >60 03/08/2017 1236   Lab Results  Component Value Date   WBC 17.7 (H) 03/08/2017   NEUTROABS 12.7 (H) 03/08/2017   HGB 10.1 (L) 03/08/2017   HCT 36.1 03/08/2017   MCV 78.8 03/08/2017   PLT 366 03/08/2017   Imaging:   Mr Jodene Nam Head Wo Contrast  Result Date: 03/10/2017 CLINICAL DATA:  Stroke follow-up. History of aneurysm coiling. Weakness and difficulty walking. Cervical and breast cancer history. EXAM: MRA NECK WITHOUT CONTRAST MRA HEAD WITHOUT CONTRAST TECHNIQUE: Multiplanar and multiecho pulse sequences of the neck were obtained without intravenous contrast. Angiographic images of the neck were obtained using MRA technique without intravenous contrast.; Angiographic images of the Circle of Willis were obtained using MRA technique without intravenous contrast. COMPARISON:  None. FINDINGS: MRA NECK FINDINGS Normal three-vessel aortic arch branching pattern. The visualized portions of the subclavian arteries are normal. Both vertebral artery  origins are normal. The vertebral system is left dominant. Moderate narrowing of the left vertebral artery mid V2 segment (series 6, image 128). The remainder of the left vertebral artery course is normal. No focal stenosis of the right vertebral artery. There is moderate atherosclerotic plaque at the right carotid bifurcation causing approximately 50% narrowing of the distal right common carotid artery. No hemodynamically significant stenosis of the right internal carotid artery. Atherosclerotic plaque at the left carotid bifurcation extending into the left internal carotid artery results in critical stenosis of the left ICA with severe narrowing along the remainder of the cervical internal carotid artery course. This has worsened slightly relative to 05/25/2011. MRA HEAD FINDINGS Intracranial internal carotid arteries: Minimal flow related enhancement of the left internal carotid artery to the ophthalmic segment. No flow related enhancement within the left carotid terminus. No right internal carotid artery stenosis. Status post coiling of right P-comm origin aneurysm. No residual aneurysm filling. Anterior cerebral arteries: Normal right anterior cerebral artery. There is no flow related enhancement within the left ACA A1 segment. Enhancement of the left A2 segment is via the anterior communicating artery. Middle cerebral arteries: Normal right MCA. No flow related enhancement in the left MCA M1 segment. Unchanged degree of collateralization in the distal left MCA distribution. Posterior communicating arteries: Patent  on the left. Incomplete on the right. Status post coiling of right P-comm origin aneurysm. Posterior cerebral arteries: Normal. Basilar artery: Normal. Vertebral arteries: Left dominant. Normal. Superior cerebellar arteries: Normal. Anterior inferior cerebellar arteries: Normal. Posterior inferior cerebellar arteries: Normal. IMPRESSION: 1. Critical stenosis of the left internal carotid artery,  slightly worsened at the left ICA origin, but otherwise unchanged with minimal flow related enhancement to the level of the ophthalmic segment and no enhancement beyond. No right ICA stenosis. 2. Unchanged occlusion of the left anterior cerebral artery A 1 segment and left middle cerebral artery M 1 segment. 3. Unchanged moderate narrowing of the mid P 2 segment of the left vertebral artery. This may be exaggerated by changes in flow direction on this time-of-flight study. 4. Status post coiling of right P-comm origin aneurysm without residual aneurysm filling. Electronically Signed   By: Ulyses Jarred M.D.   On: 03/10/2017 14:27   Mr Jodene Nam Neck Wo Contrast  Result Date: 03/10/2017 CLINICAL DATA:  Stroke follow-up. History of aneurysm coiling. Weakness and difficulty walking. Cervical and breast cancer history. EXAM: MRA NECK WITHOUT CONTRAST MRA HEAD WITHOUT CONTRAST TECHNIQUE: Multiplanar and multiecho pulse sequences of the neck were obtained without intravenous contrast. Angiographic images of the neck were obtained using MRA technique without intravenous contrast.; Angiographic images of the Circle of Willis were obtained using MRA technique without intravenous contrast. COMPARISON:  None. FINDINGS: MRA NECK FINDINGS Normal three-vessel aortic arch branching pattern. The visualized portions of the subclavian arteries are normal. Both vertebral artery origins are normal. The vertebral system is left dominant. Moderate narrowing of the left vertebral artery mid V2 segment (series 6, image 128). The remainder of the left vertebral artery course is normal. No focal stenosis of the right vertebral artery. There is moderate atherosclerotic plaque at the right carotid bifurcation causing approximately 50% narrowing of the distal right common carotid artery. No hemodynamically significant stenosis of the right internal carotid artery. Atherosclerotic plaque at the left carotid bifurcation extending into the left  internal carotid artery results in critical stenosis of the left ICA with severe narrowing along the remainder of the cervical internal carotid artery course. This has worsened slightly relative to 05/25/2011. MRA HEAD FINDINGS Intracranial internal carotid arteries: Minimal flow related enhancement of the left internal carotid artery to the ophthalmic segment. No flow related enhancement within the left carotid terminus. No right internal carotid artery stenosis. Status post coiling of right P-comm origin aneurysm. No residual aneurysm filling. Anterior cerebral arteries: Normal right anterior cerebral artery. There is no flow related enhancement within the left ACA A1 segment. Enhancement of the left A2 segment is via the anterior communicating artery. Middle cerebral arteries: Normal right MCA. No flow related enhancement in the left MCA M1 segment. Unchanged degree of collateralization in the distal left MCA distribution. Posterior communicating arteries: Patent on the left. Incomplete on the right. Status post coiling of right P-comm origin aneurysm. Posterior cerebral arteries: Normal. Basilar artery: Normal. Vertebral arteries: Left dominant. Normal. Superior cerebellar arteries: Normal. Anterior inferior cerebellar arteries: Normal. Posterior inferior cerebellar arteries: Normal. IMPRESSION: 1. Critical stenosis of the left internal carotid artery, slightly worsened at the left ICA origin, but otherwise unchanged with minimal flow related enhancement to the level of the ophthalmic segment and no enhancement beyond. No right ICA stenosis. 2. Unchanged occlusion of the left anterior cerebral artery A 1 segment and left middle cerebral artery M 1 segment. 3. Unchanged moderate narrowing of the mid P 2 segment of the  left vertebral artery. This may be exaggerated by changes in flow direction on this time-of-flight study. 4. Status post coiling of right P-comm origin aneurysm without residual aneurysm filling.  Electronically Signed   By: Ulyses Jarred M.D.   On: 03/10/2017 14:27   Mr Jeri Cos DG Contrast  Result Date: 02/21/2017 CLINICAL DATA:  Breast cancer. Metastatic lung cancer. History of right posterior communicating artery aneurysm coiling. EXAM: MRI HEAD WITHOUT AND WITH CONTRAST TECHNIQUE: Multiplanar, multiecho pulse sequences of the brain and surrounding structures were obtained without and with intravenous contrast. CONTRAST:  15 mL MultiHance IV COMPARISON:  MRI head 01/30/2017, 05/25/2011 FINDINGS: Brain: New area of restricted diffusion head of caudate on the left compatible with acute/subacute infarct. Previously noted small areas of restricted diffusion in the right occipital lobe now shows ill-defined enhancement, most consistent with subacute infarct. Small areas of restricted diffusion left parietal cortex and left posterior temporal lobe have resolved. Findings most compatible with ischemia rather than metastatic disease. Chronic microvascular ischemic changes in the white matter and pons. Small chronic infarct left frontal lobe. Negative for hemorrhage. Postcontrast imaging degraded by motion. 6 mm enhancing lesion right occipital lobe has angular margins and most compatible with subacute enhancing infarct based on prior MRI and morphology. No other worrisome enhancing lesions. Vascular: Prior coiling of right posterior communicating artery aneurysm with susceptibility. Chronic occlusion left internal carotid artery Skull and upper cervical spine: C3 vertebral body lesion has progressed in the interval compatible with metastatic disease. No calvarial lesions. Sinuses/Orbits: Negative Other: None IMPRESSION: When compared with the prior MRI of 01/30/2017, there is improvement in the areas of restricted diffusion in the left parietal and left temporal lobe compatible with infarction. There enhancement of the small lesion in the right occipital lobe most consistent with subacute infarction. There is a  new area of subacute infarct in the head of caudate on the left. Chronic occlusion left internal carotid artery with chronic ischemic changes as above. No definite metastatic disease in the brain. Continued close followup is warranted. Progression of bone marrow lesion in the C3 vertebral body compatible with metastatic disease. Electronically Signed   By: Franchot Gallo M.D.   On: 02/21/2017 07:52   Ir US Guide Vasc Access Right  Result Date: 03/08/2017 INDICATION: 67 year old with metastatic non-small cell lung cancer. Planning for chemotherapy. EXAM: FLUOROSCOPIC AND ULTRASOUND GUIDED PLACEMENT OF A SUBCUTANEOUS PORT COMPARISON:  None. MEDICATIONS: Ancef 2 g; The antibiotic was administered within an appropriate time interval prior to skin puncture. ANESTHESIA/SEDATION: Versed 2.0 mg IV; Fentanyl 100 mcg IV; Moderate Sedation Time:  36 minutes The patient was continuously monitored during the procedure by the interventional radiology nurse under my direct supervision. FLUOROSCOPY TIME:  18 seconds, 3 mGy COMPLICATIONS: None immediate. PROCEDURE: The procedure, risks, benefits, and alternatives were explained to the patient. Questions regarding the procedure were encouraged and answered. The patient understands and consents to the procedure. Patient was placed supine on the interventional table. Ultrasound confirmed a patent right internal jugular vein. The right chest and neck were cleaned with a skin antiseptic and a sterile drape was placed. Maximal barrier sterile technique was utilized including caps, mask, sterile gowns, sterile gloves, sterile drape, hand hygiene and skin antiseptic. The right neck was anesthetized with 1% lidocaine. Small incision was made in the right neck with a blade. Micropuncture set was placed in the right internal jugular vein with ultrasound guidance. The micropuncture wire was used for measurement purposes. The right chest was anesthetized with  1% lidocaine with epinephrine.  #15 blade was used to make an incision and a subcutaneous port pocket was formed. [place] was assembled. Subcutaneous tunnel was formed with a stiff tunneling device. The port catheter was brought through the subcutaneous tunnel. The port was placed in the subcutaneous pocket and sutured in place. The micropuncture set was exchanged for a peel-away sheath. The catheter was placed through the peel-away sheath and the tip was positioned at the superior cavoatrial junction. Catheter placement was confirmed with fluoroscopy. The port was accessed and flushed with heparinized saline. The port pocket was closed using two layers of absorbable sutures and Dermabond. The vein skin site was closed using a single layer of absorbable suture and Dermabond. Sterile dressings were applied. Patient tolerated the procedure well without an immediate complication. Ultrasound and fluoroscopic images were taken and saved for this procedure. IMPRESSION: Placement of a subcutaneous port device. Electronically Signed   By: Markus Daft M.D.   On: 03/08/2017 16:54   Ct Biopsy  Result Date: 02/21/2017 INDICATION: Bone lesions EXAM: CT BIOPSY; CT BONE MARROW BIOPSY AND ASPIRATION MEDICATIONS: None. ANESTHESIA/SEDATION: Fentanyl 100 mcg IV; Versed 2 mg IV Moderate Sedation Time:  20 minutes The patient was continuously monitored during the procedure by the interventional radiology nurse under my direct supervision. FLUOROSCOPY TIME:  Fluoroscopy Time:  minutes  seconds ( mGy). COMPLICATIONS: None immediate. PROCEDURE: Informed written consent was obtained from the patient after a thorough discussion of the procedural risks, benefits and alternatives. All questions were addressed. Maximal Sterile Barrier Technique was utilized including caps, mask, sterile gowns, sterile gloves, sterile drape, hand hygiene and skin antiseptic. A timeout was performed prior to the initiation of the procedure. Under CT guidance, a(n) 11 gauge guide  needle was advanced into the right iliac bone. Aspirates and a core were obtained. Subsequently, the 11 gauge needle was directed towards a sclerotic bone lesion in the right iliac bone and a final 11 gauge core was obtained. Post biopsy images demonstrate no hemorrhage. Patient tolerated the procedure well without complication. Vital sign monitoring by nursing staff during the procedure will continue as patient is in the special procedures unit for post procedure observation. The images document guide needle placement within the normal portion of the right iliac bone. Subsequent images demonstrate needle placement in the sclerotic lesion within the right iliac bone. Post biopsy images demonstrate no hemorrhage. FINDINGS: The images document guide needle placement within the normal portion of the right iliac bone. Subsequent images demonstrate needle placement in the sclerotic lesion within the right iliac bone. Post biopsy images demonstrate no hemorrhage. IMPRESSION: Successful right sclerotic bone lesion core biopsy. Successful right iliac bone marrow biopsy. Electronically Signed   By: Marybelle Killings M.D.   On: 02/21/2017 11:20   Ct Biopsy  Result Date: 02/21/2017 INDICATION: Bone lesions EXAM: CT BIOPSY; CT BONE MARROW BIOPSY AND ASPIRATION MEDICATIONS: None. ANESTHESIA/SEDATION: Fentanyl 100 mcg IV; Versed 2 mg IV Moderate Sedation Time:  20 minutes The patient was continuously monitored during the procedure by the interventional radiology nurse under my direct supervision. FLUOROSCOPY TIME:  Fluoroscopy Time:  minutes  seconds ( mGy). COMPLICATIONS: None immediate. PROCEDURE: Informed written consent was obtained from the patient after a thorough discussion of the procedural risks, benefits and alternatives. All questions were addressed. Maximal Sterile Barrier Technique was utilized including caps, mask, sterile gowns, sterile gloves, sterile drape, hand hygiene and skin antiseptic. A timeout was performed  prior to the initiation of the procedure.  Under CT guidance, a(n) 11 gauge guide needle was advanced into the right iliac bone. Aspirates and a core were obtained. Subsequently, the 11 gauge needle was directed towards a sclerotic bone lesion in the right iliac bone and a final 11 gauge core was obtained. Post biopsy images demonstrate no hemorrhage. Patient tolerated the procedure well without complication. Vital sign monitoring by nursing staff during the procedure will continue as patient is in the special procedures unit for post procedure observation. The images document guide needle placement within the normal portion of the right iliac bone. Subsequent images demonstrate needle placement in the sclerotic lesion within the right iliac bone. Post biopsy images demonstrate no hemorrhage. FINDINGS: The images document guide needle placement within the normal portion of the right iliac bone. Subsequent images demonstrate needle placement in the sclerotic lesion within the right iliac bone. Post biopsy images demonstrate no hemorrhage. IMPRESSION: Successful right sclerotic bone lesion core biopsy. Successful right iliac bone marrow biopsy. Electronically Signed   By: Marybelle Killings M.D.   On: 02/21/2017 11:20   Ct Bone Marrow Biopsy & Aspiration  Result Date: 02/21/2017 INDICATION: Bone lesions EXAM: CT BIOPSY; CT BONE MARROW BIOPSY AND ASPIRATION MEDICATIONS: None. ANESTHESIA/SEDATION: Fentanyl 100 mcg IV; Versed 2 mg IV Moderate Sedation Time:  20 minutes The patient was continuously monitored during the procedure by the interventional radiology nurse under my direct supervision. FLUOROSCOPY TIME:  Fluoroscopy Time:  minutes  seconds ( mGy). COMPLICATIONS: None immediate. PROCEDURE: Informed written consent was obtained from the patient after a thorough discussion of the procedural risks, benefits and alternatives. All questions were addressed. Maximal Sterile Barrier Technique was utilized including caps,  mask, sterile gowns, sterile gloves, sterile drape, hand hygiene and skin antiseptic. A timeout was performed prior to the initiation of the procedure. Under CT guidance, a(n) 11 gauge guide needle was advanced into the right iliac bone. Aspirates and a core were obtained. Subsequently, the 11 gauge needle was directed towards a sclerotic bone lesion in the right iliac bone and a final 11 gauge core was obtained. Post biopsy images demonstrate no hemorrhage. Patient tolerated the procedure well without complication. Vital sign monitoring by nursing staff during the procedure will continue as patient is in the special procedures unit for post procedure observation. The images document guide needle placement within the normal portion of the right iliac bone. Subsequent images demonstrate needle placement in the sclerotic lesion within the right iliac bone. Post biopsy images demonstrate no hemorrhage. FINDINGS: The images document guide needle placement within the normal portion of the right iliac bone. Subsequent images demonstrate needle placement in the sclerotic lesion within the right iliac bone. Post biopsy images demonstrate no hemorrhage. IMPRESSION: Successful right sclerotic bone lesion core biopsy. Successful right iliac bone marrow biopsy. Electronically Signed   By: Marybelle Killings M.D.   On: 02/21/2017 11:20   Ir Fluoro Guide Port Insertion Right  Result Date: 03/08/2017 INDICATION: 67 year old with metastatic non-small cell lung cancer. Planning for chemotherapy. EXAM: FLUOROSCOPIC AND ULTRASOUND GUIDED PLACEMENT OF A SUBCUTANEOUS PORT COMPARISON:  None. MEDICATIONS: Ancef 2 g; The antibiotic was administered within an appropriate time interval prior to skin puncture. ANESTHESIA/SEDATION: Versed 2.0 mg IV; Fentanyl 100 mcg IV; Moderate Sedation Time:  36 minutes The patient was continuously monitored during the procedure by the interventional radiology nurse under my direct supervision. FLUOROSCOPY  TIME:  18 seconds, 3 mGy COMPLICATIONS: None immediate. PROCEDURE: The procedure, risks, benefits, and alternatives were explained to the patient. Questions regarding  the procedure were encouraged and answered. The patient understands and consents to the procedure. Patient was placed supine on the interventional table. Ultrasound confirmed a patent right internal jugular vein. The right chest and neck were cleaned with a skin antiseptic and a sterile drape was placed. Maximal barrier sterile technique was utilized including caps, mask, sterile gowns, sterile gloves, sterile drape, hand hygiene and skin antiseptic. The right neck was anesthetized with 1% lidocaine. Small incision was made in the right neck with a blade. Micropuncture set was placed in the right internal jugular vein with ultrasound guidance. The micropuncture wire was used for measurement purposes. The right chest was anesthetized with 1% lidocaine with epinephrine. #15 blade was used to make an incision and a subcutaneous port pocket was formed. [place] was assembled. Subcutaneous tunnel was formed with a stiff tunneling device. The port catheter was brought through the subcutaneous tunnel. The port was placed in the subcutaneous pocket and sutured in place. The micropuncture set was exchanged for a peel-away sheath. The catheter was placed through the peel-away sheath and the tip was positioned at the superior cavoatrial junction. Catheter placement was confirmed with fluoroscopy. The port was accessed and flushed with heparinized saline. The port pocket was closed using two layers of absorbable sutures and Dermabond. The vein skin site was closed using a single layer of absorbable suture and Dermabond. Sterile dressings were applied. Patient tolerated the procedure well without an immediate complication. Ultrasound and fluoroscopic images were taken and saved for this procedure. IMPRESSION: Placement of a subcutaneous port device.  Electronically Signed   By: Markus Daft M.D.   On: 03/08/2017 16:54   Imaging: CLINICAL DATA:  Stroke follow-up. History of aneurysm coiling. Weakness and difficulty walking. Cervical and breast cancer history.  EXAM: MRA NECK WITHOUT CONTRAST  MRA HEAD WITHOUT CONTRAST  TECHNIQUE: Multiplanar and multiecho pulse sequences of the neck were obtained without intravenous contrast. Angiographic images of the neck were obtained using MRA technique without intravenous contrast.; Angiographic images of the Circle of Willis were obtained using MRA technique without intravenous contrast.  COMPARISON:  None.  FINDINGS: MRA NECK FINDINGS  Normal three-vessel aortic arch branching pattern. The visualized portions of the subclavian arteries are normal.  Both vertebral artery origins are normal. The vertebral system is left dominant. Moderate narrowing of the left vertebral artery mid V2 segment (series 6, image 128). The remainder of the left vertebral artery course is normal. No focal stenosis of the right vertebral artery.  There is moderate atherosclerotic plaque at the right carotid bifurcation causing approximately 50% narrowing of the distal right common carotid artery. No hemodynamically significant stenosis of the right internal carotid artery.  Atherosclerotic plaque at the left carotid bifurcation extending into the left internal carotid artery results in critical stenosis of the left ICA with severe narrowing along the remainder of the cervical internal carotid artery course. This has worsened slightly relative to 05/25/2011.  MRA HEAD FINDINGS  Intracranial internal carotid arteries: Minimal flow related enhancement of the left internal carotid artery to the ophthalmic segment. No flow related enhancement within the left carotid terminus. No right internal carotid artery stenosis. Status post coiling of right P-comm origin aneurysm. No residual  aneurysm filling.  Anterior cerebral arteries: Normal right anterior cerebral artery. There is no flow related enhancement within the left ACA A1 segment. Enhancement of the left A2 segment is via the anterior communicating artery.  Middle cerebral arteries: Normal right MCA. No flow related enhancement in  the left MCA M1 segment. Unchanged degree of collateralization in the distal left MCA distribution.  Posterior communicating arteries: Patent on the left. Incomplete on the right. Status post coiling of right P-comm origin aneurysm.  Posterior cerebral arteries: Normal.  Basilar artery: Normal.  Vertebral arteries: Left dominant. Normal.  Superior cerebellar arteries: Normal.  Anterior inferior cerebellar arteries: Normal.  Posterior inferior cerebellar arteries: Normal.  IMPRESSION: 1. Critical stenosis of the left internal carotid artery, slightly worsened at the left ICA origin, but otherwise unchanged with minimal flow related enhancement to the level of the ophthalmic segment and no enhancement beyond. No right ICA stenosis. 2. Unchanged occlusion of the left anterior cerebral artery A 1 segment and left middle cerebral artery M 1 segment. 3. Unchanged moderate narrowing of the mid P 2 segment of the left vertebral artery. This may be exaggerated by changes in flow direction on this time-of-flight study. 4. Status post coiling of right P-comm origin aneurysm without residual aneurysm filling.   Electronically Signed   By: Ulyses Jarred M.D.   On: 03/10/2017 14:27  Assessment/Plan Cerebrovascular accident (CVA) due to stenosis of left carotid artery ([place])   Ms. Diffee is clinically stable today.  We reviewed her MRA findings, from which it is apparent that vessel-to-vessel embolism from carotid origin is underlying etiology of recent infarcts.  We discussed possible treatment options, including medical management and surgical evaluation.  She  strongly prefers not to undergo surgery for this problem, the goal of which would be to prevent complete occlusion of the vessel.    In this case we recommend indefinitely continuing anticoagulation with xarelto.    She should return in 6 months with an MRI brain for review, to both assess response to current therapy and for metastatic surveillance.  We appreciate the opportunity to participate in the care of [name].    All questions were answered. The patient knows to call the clinic with any problems, questions or concerns. No barriers to learning were detected.  The total time spent in the encounter was 45 minutes and more than 50% was on counseling and review of test results   Ventura Sellers, MD Medical Director of Neuro-Oncology [facility] 03/13/17 11:36 AM

## 2017-03-13 NOTE — Progress Notes (Signed)
  Radiation Oncology         [phone]) [phone] ________________________________  Name: [name] MRN: [id number]  Date: 03/13/2017  DOB: 03-22-1950  SIMULATION AND TREATMENT PLANNING NOTE    ICD-10-CM   1. Primary cancer of left upper lobe of lung ([place]) C34.12   2. Bone metastasis ([place]) C79.51     DIAGNOSIS:  67 y.o. 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  Identity was confirmed.  All relevant records and images related to the planned course of therapy were reviewed.  The patient freely provided informed written consent to proceed with treatment after reviewing the details related to the planned course of therapy. The consent form was witnessed and verified by the simulation staff.  Then, the patient was set-up in a stable reproducible  supine position for radiation therapy.  CT images were obtained.  Surface markings were placed.  The CT images were loaded into the planning software.  Then the target and avoidance structures were contoured.  Treatment planning then occurred.  The radiation prescription was entered and confirmed.  Then, I designed and supervised the construction of a total of 7 medically necessary complex treatment devices with BodyFix, then 2 MLC's for left chest, and 4 MLCs for pelvis.  I have requested : 3D Simulation  I have requested a DVH of the following structures: kidneys lungs and targets.    PLAN:  The patient will receive 30 Gy in 10 fractions to the left upper lung primary and t-spine and also 30 Gy in 10 fractions to the sacral metastases and proximal femora.  ________________________________  Sheral Apley Tammi Klippel, M.D.

## 2017-03-13 NOTE — Progress Notes (Signed)
Chemotherapy teaching completed.  Extensive teaching packet given.  Consent signed.  Pt taken to the [facility] room.

## 2017-03-13 NOTE — Patient Instructions (Signed)
[place] [facility] Discharge Instructions  RECOMMENDATIONS MADE BY THE CONSULTANT AND ANY TEST RESULTS WILL BE SENT TO YOUR REFERRING PHYSICIAN.  b12 injection today Return as scheduled Please call the clinic if you have any questions or concerns  Thank you for choosing [place] at [facility] to provide your oncology and hematology care.  To afford each patient quality time with our provider, please arrive at least 15 minutes before your scheduled appointment time.    If you have a lab appointment with the [facility] please come in thru the  Main Entrance and check in at the main information desk  You need to re-schedule your appointment should you arrive 10 or more minutes late.  We strive to give you quality time with our providers, and arriving late affects you and other patients whose appointments are after yours.  Also, if you no show three or more times for appointments you may be dismissed from the clinic at the providers discretion.     Again, thank you for choosing [facility].  Our hope is that these requests will decrease the amount of time that you wait before being seen by our physicians.       _____________________________________________________________  Should you have questions after your visit to [facility], please contact our office at [phone] between the hours of 8:30 a.m. and 4:30 p.m.  Voicemails left after 4:30 p.m. will not be returned until the following business day.  For prescription refill requests, have your pharmacy contact our office.       Resources For Cancer Patients and their Caregivers ? American Cancer Society: Can assist with transportation, wigs, general needs, runs Look Good Feel Better.        8165124667 ? Cancer Care: Provides financial assistance, online support groups, medication/co-pay assistance.  1-800-813-HOPE 361-149-8682) ? [place] Assists [place] cancer patients and their families through emotional , educational and financial support.  (650)797-3619 ? [place] DSS Where to apply for food stamps, Medicaid and utility assistance. (463) 761-9581 ? [facility]: Transportation to medical appointments. 412-380-4904 ? Social Security Administration: May apply for disability if have a Stage IV cancer. 8430446151 (904) 525-5265 ? [employer], Disability and Transit Services: Assists with nutrition, care and transit needs. [place] Support Programs: @10RELATIVEDAYS @ > Cancer Support Group  2nd Tuesday of the month 1pm-2pm, Journey Room  > Creative Journey  3rd Tuesday of the month 1130am-1pm, Journey Room  > Look Good Feel Better  1st Wednesday of the month 10am-12 noon, Journey Room (Call [place] to register [phone])

## 2017-03-13 NOTE — Telephone Encounter (Signed)
Left message for patient re July appointment. Schedule mailed.

## 2017-03-14 ENCOUNTER — Inpatient Hospital Stay (HOSPITAL_COMMUNITY): Payer: Medicare Other

## 2017-03-14 ENCOUNTER — Other Ambulatory Visit: Payer: Self-pay

## 2017-03-14 ENCOUNTER — Inpatient Hospital Stay (HOSPITAL_BASED_OUTPATIENT_CLINIC_OR_DEPARTMENT_OTHER): Payer: Medicare Other | Admitting: Internal Medicine

## 2017-03-14 ENCOUNTER — Encounter (HOSPITAL_COMMUNITY): Payer: Self-pay | Admitting: Internal Medicine

## 2017-03-14 VITALS — BP 134/58 | HR 66 | Temp 97.6°F | Resp 20 | Wt 166.8 lb

## 2017-03-14 DIAGNOSIS — C7949 Secondary malignant neoplasm of other parts of nervous system: Secondary | ICD-10-CM | POA: Diagnosis not present

## 2017-03-14 DIAGNOSIS — M069 Rheumatoid arthritis, unspecified: Secondary | ICD-10-CM | POA: Diagnosis not present

## 2017-03-14 DIAGNOSIS — C7951 Secondary malignant neoplasm of bone: Secondary | ICD-10-CM | POA: Diagnosis not present

## 2017-03-14 DIAGNOSIS — C3412 Malignant neoplasm of upper lobe, left bronchus or lung: Secondary | ICD-10-CM

## 2017-03-14 DIAGNOSIS — Z86718 Personal history of other venous thrombosis and embolism: Secondary | ICD-10-CM

## 2017-03-14 DIAGNOSIS — Z7901 Long term (current) use of anticoagulants: Secondary | ICD-10-CM | POA: Diagnosis not present

## 2017-03-14 DIAGNOSIS — G893 Neoplasm related pain (acute) (chronic): Secondary | ICD-10-CM

## 2017-03-14 DIAGNOSIS — R945 Abnormal results of liver function studies: Secondary | ICD-10-CM | POA: Diagnosis not present

## 2017-03-14 DIAGNOSIS — C7931 Secondary malignant neoplasm of brain: Secondary | ICD-10-CM | POA: Diagnosis not present

## 2017-03-14 DIAGNOSIS — R109 Unspecified abdominal pain: Secondary | ICD-10-CM

## 2017-03-14 LAB — CBC WITH DIFFERENTIAL/PLATELET
BASOS ABS: 0 10*3/uL (ref 0.0–0.1)
Basophils Relative: 0 %
Eosinophils Absolute: 0.1 10*3/uL (ref 0.0–0.7)
Eosinophils Relative: 1 %
HCT: 31 % — ABNORMAL LOW (ref 36.0–46.0)
Hemoglobin: 8.4 g/dL — ABNORMAL LOW (ref 12.0–15.0)
LYMPHS PCT: 13 %
Lymphs Abs: 2.4 10*3/uL (ref 0.7–4.0)
MCH: 21.5 pg — ABNORMAL LOW (ref 26.0–34.0)
MCHC: 27.1 g/dL — ABNORMAL LOW (ref 30.0–36.0)
MCV: 79.5 fL (ref 78.0–100.0)
Monocytes Absolute: 1.7 10*3/uL (ref 0.1–1.0)
Monocytes Relative: 9 %
NEUTROS PCT: 77 %
Neutro Abs: 14.6 10*3/uL (ref 1.7–7.7)
Platelets: 282 10*3/uL (ref 150–400)
RBC: 3.9 MIL/uL (ref 3.87–5.11)
RDW: 18.6 % — ABNORMAL HIGH (ref 11.5–15.5)
WBC: 18.9 10*3/uL — AB (ref 4.0–10.5)

## 2017-03-14 LAB — COMPREHENSIVE METABOLIC PANEL
ALT: 81 U/L — ABNORMAL HIGH (ref 14–54)
ANION GAP: 11 (ref 5–15)
AST: 28 U/L (ref 15–41)
Albumin: 3 g/dL — ABNORMAL LOW (ref 3.5–5.0)
Alkaline Phosphatase: 259 U/L — ABNORMAL HIGH (ref 38–126)
BUN: 18 mg/dL (ref 6–20)
CHLORIDE: 104 mmol/L (ref 101–111)
CO2: 25 mmol/L (ref 22–32)
CREATININE: 0.68 mg/dL (ref 0.44–1.00)
Calcium: 9.4 mg/dL (ref 8.9–10.3)
Glucose, Bld: 224 mg/dL — ABNORMAL HIGH (ref 65–99)
POTASSIUM: 3.4 mmol/L — AB (ref 3.5–5.1)
Sodium: 140 mmol/L (ref 135–145)
Total Bilirubin: 0.4 mg/dL (ref 0.3–1.2)
Total Protein: 6 g/dL — ABNORMAL LOW (ref 6.5–8.1)

## 2017-03-14 LAB — LACTATE DEHYDROGENASE: LDH: 244 U/L — AB (ref 98–192)

## 2017-03-14 MED ORDER — HEPARIN SOD (PORK) LOCK FLUSH 100 UNIT/ML IV SOLN
500.0000 [IU] | Freq: Once | INTRAVENOUS | Status: AC
  Administered 2017-03-14: 500 [IU] via INTRAVENOUS

## 2017-03-14 MED ORDER — MORPHINE SULFATE 20 MG/5ML PO SOLN: 4.0000 mg | Freq: Two times a day (BID) | ORAL | 0 refills | Status: DC | PRN

## 2017-03-14 MED ORDER — SODIUM CHLORIDE 0.9% FLUSH
10.0000 mL | INTRAVENOUS | Status: DC | PRN
  Administered 2017-03-14: 10 mL via INTRAVENOUS
  Filled 2017-03-14: qty 10

## 2017-03-14 NOTE — Progress Notes (Signed)
[facility] Follow up:    [name], [address] [place] [address]   DIAGNOSIS: Cancer Staging Primary cancer of left upper lobe of lung [facility]) Staging form: Lung, AJCC 8th Edition - Clinical: Stage IV (cT3, cN2, cM1c) - Signed by Tyler Pita, MD on 02/27/2017  Assessment and plan:  1.  Stage IV NSCLC.  Pt has evidence of advanced disease based on PET scan that was done 02/06/2017.  She has evidence of 2 liver lesions as well as metastatic disease to her lumbar spine but no evidence of cord compression.  I have discussed the case with Dr. Pascal Lux of IR.  He will attempt a liver biopsy in order to obtain additional tissue for lung biomarker evaluation.  As she is symptomatic with pain she will proceed with radiation therapy which is scheduled to begin this week.  She will return to clinic in 7-10 days to go over results of her liver biopsy and to discuss beginning treatment with chemotherapy.  She has been recommended for Alimta, Carboplatin and Keytruda.  All questions answered and she expressed understanding of the information presented.  2.  Pain.  She is currently on MS Contin and needs refills on Morphine elixir rxd by Dr. Lebron Conners.  She is given a prescription today.  She is scheduled to begin RT and will evaluate for improvement in symptoms with treatment.    3.  Metastatic bone lesions.  She is scheduled to begin RT this week.  She is on Xgeva monthly.    4.  DVT.  She is on Xarelto.  I have discussed this also with Dr. Pascal Lux and IR will make recommendations regarding management of anticoagulation prior to liver biopsy.  5.  Rheumatoid arthritis.  She is on Plaquenil.  6.  Elevated liver function test.  The patient has evidence of liver lesions noted on recent PET imaging.  We will continue to monitor liver function tests once therapy proceeds.  Interval History: 67 y.o. Female seen by Dr. Lebron Conners for metastatic malignancy.  Please see  oncological history below for details.  She has a history of breast cancer diagnosed in 2004, treated surgically with lumpectomy with DCIS on pathology.  She also has a DVT in the left lower extremity in December 2017 treated with Xarelto.  Currently undergoing treatment with prednisone and Orencia for diagnosis of rheumatoid arthritis.  Her social history is significant for 30-pack-year tobacco smoking, patient quit in 1980s.  No family history of malignancy.    Patient initially presented on January 06, 2017 with interscapular back pain which was new to her.  On 01/12/17, patient had a 5-day episode of hemoptysis that resolved spontaneously.  Since initial presentation, patient had progressive worsening of the back pain.  Initially, plain films of the back were obtained demonstrating an abnormality at T10 which went to additional imaging outlined below.    Current Status:  Pt is seen today for follow-up.  Patient has undergone bone biopsy on 02/21/2017 that showed metastatic adenocarcinoma consistent with lung primary.  Pathology was unable to perform PDL 1 or lung biomarker testing.  She was originally scheduled for a liver biopsy on 03/05/2017 but this was canceled.  She is scheduled for  radiation on Thursday.  She denies any respiratory symptoms but does have ongoing pain.  She reports she was given a prescription for MS Contin but was not given anything for breakthrough pain.  She has treatment plan by Dr. Lebron Conners for Alimta  and Carbo and Keytruda.    Oncological/hematological History: --MRI C Spine, 01/30/17: Abnormality at C3 & T1 --sclerotic lesion T1 with near complete replacement of the vertebral body. --MRI Brain, 01/30/17: 3 subcentimeter lesions concerning for possible metastatic disease. --MRI T/L spine, 02/03/17: Metastatic deposits at multiple levels including previously described T1 lesion.  Bilobed lesion at the T10 approximately 1 cm diameter with central necrosis.  Additional right  pedicle and transverse process lesion at T10 measuring 2.0 cm.  No epidural tumor.  0.5 cm at T12 on the left.  Paravertebral tumor at T5/T6 on the left.  Metastatic deposits in L4 and L5 with almost 50% replacement of L4 without pathological fracture.  No evidence of cord compression.  Right L4 nerve root impingement noted. --PET-CT, 02/06/17: 1.7cm LtUL mass, SUVmax 6.1, Lt perihilar LAD, 2.0cm, SUVmax 9.3. No axillary lymphadenopathy, no abnormal enhancement in the breasts.  2.2 cm lesion in the dome of the liver, SUV max 8.0.  2.2 cm lesion in the caudate lobe of the liver, SUV max 12.8.  Multiple hypermetabolic skeletal lesions including bilateral proximal femoral lesions.   --Bone Bx, 02/21/17: Metastatic adenocarcinoma; IHC -- positive for CK 7, TTF-1, Napsin-A & negative for CK20, CDX-2, GATA3, PAX8 --consistent with pulmonary primary. --Doppler US Lt LE, 02/25/16 positive for recurrent deep vein thrombosis.    Patient Active Problem List   Diagnosis Date Noted  . Bone metastasis ([place]) 03/13/2017  . Palliative care by specialist   . DNR (do not resuscitate)   . Primary cancer of left upper lobe of lung ([place]) 02/27/2017  . Secondary malignant neoplasm of brain and spinal cord ([place]) 02/15/2017  . Venous insufficiency   . Venous intermittent claudication   . Diverticulitis of colon (without mention of hemorrhage)(562.11) 08/26/2013  . Postoperative anemia due to acute blood loss 02/26/2013  . Anemia, unspecified 02/26/2013  . DJD (degenerative joint disease) of knee 02/25/2013  . PONV (postoperative nausea and vomiting)   . Stroke ([place])   . Hypercholesterolemia   . Metastatic cancer ([place])   . Colon polyps   . Arthritis   . Rheumatoid arthritis with rheumatoid factor (HCC)   . Right knee DJD   . Asthenia 08/14/2012  . Cerebral aneurysm, nonruptured 08/14/2012  . TIA (transient ischemic attack) 08/14/2012  . RA (rheumatoid arthritis) 10/27/2011  . High cholesterol 10/27/2011  .  Hypertension 10/27/2011  . GERD (gastroesophageal reflux disease) 10/27/2011    is allergic to aurothioglucose [solganal]; feldene [piroxicam]; hydralazine; imdur [isosorbide mononitrate]; imuran [azathioprine]; methotrexate derivatives; calcium channel blockers; sulfa antibiotics; thimerosal; vioxx [rofecoxib]; arava [leflunomide]; asacol [mesalamine]; iohexol; hydrocodone; latex; neomycin; nickel; nsaids; and percocet [oxycodone-acetaminophen].  MEDICAL HISTORY: Past Medical History:  Diagnosis Date  . Anemia   . Angina   . Arthritis    Rheumatoid  . Brain cancer ([place])   . Breast cancer ([place])   . Cancer ([place])    left breast, cervical cancer  . Colon polyps   . Constipation   . Diabetes mellitus without complication ([place])   . Diarrhea   . Dysrhythmia   . GERD (gastroesophageal reflux disease)   . Headache   . Heart murmur   . History of kidney stones   . Hypercholesterolemia   . Hypertension   . Lung cancer ([place])   . Numbness and tingling in right hand   . Numbness and tingling of both feet   . Peripheral vascular disease ([place])    cerebrel aneurysm /sp coil  . Pneumonia   . PONV (  postoperative nausea and vomiting)   . Raynaud's syndrome   . Rheumatoid arthritis with rheumatoid factor (HCC)   . Right knee DJD   . Stroke [facility])    jan 2012    SURGICAL HISTORY: Past Surgical History:  Procedure Laterality Date  . ABDOMINAL HYSTERECTOMY  1977  . APPENDECTOMY  1964  . BILATERAL OOPHORECTOMY    . CHOLECYSTECTOMY    . COLONOSCOPY  12/09/2010   Procedure: COLONOSCOPY;  Surgeon: Rogene Houston, MD;  [facility];  Service: Endoscopy;  Laterality: N/A;  . COLONOSCOPY N/A 06/26/2015   Procedure: COLONOSCOPY;  Surgeon: Rogene Houston, MD;  [facility];  Service: Endoscopy;  Laterality: N/A;  1:05-moved to [address] to notify pt  . ESOPHAGOGASTRODUODENOSCOPY N/A 06/26/2015   Procedure: ESOPHAGOGASTRODUODENOSCOPY (EGD);  Surgeon: Rogene Houston, MD;   [facility];  Service: Endoscopy;  Laterality: N/A;  . EYE SURGERY     cataract surgery bilat   . GIVENS CAPSULE STUDY N/A 08/20/2015   Procedure: GIVENS CAPSULE STUDY;  Surgeon: Rogene Houston, MD;  [facility];  Service: Endoscopy;  Laterality: N/A;  730  . IR FLUORO GUIDE PORT INSERTION RIGHT  03/08/2017  . IR US GUIDE VASC ACCESS RIGHT  03/08/2017  . left breast lumpectomy  2004  . left knee arthroscopy    . Right brain aneurysm  04/2010   Coiled   . right wrist surgery  1999   synevectomy  . TOTAL KNEE ARTHROPLASTY Right 02/25/2013   Procedure: TOTAL KNEE ARTHROPLASTY;  Surgeon: Lorn Junes, MD;  Location: [facility];  Service: Orthopedics;  Laterality: Right;    SOCIAL HISTORY: Social History   Socioeconomic History  . Marital status: Married    Spouse name: Hedy Camara  . Number of children: o  . Years of education: 55  . Highest education level: Not on file  Social Needs  . Financial resource strain: Not on file  . Food insecurity - worry: Not on file  . Food insecurity - inability: Not on file  . Transportation needs - medical: Not on file  . Transportation needs - non-medical: Not on file  Occupational History  . 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 Smoking status: Former Smoker    Packs/day: 2.00    Years: 15.00    Pack years: 30.00    Last attempt to quit: 02/20/1984    Years since quitting: 33.0  . Smokeless tobacco: Never Used  . Tobacco comment: Quit 1986  Substance and Sexual Activity  . Alcohol use: No  . Drug use: No  . Sexual activity: Not Currently  Other Topics Concern  . Not on file  Social History Narrative   Employed with [employer]. Retired January 2017. Married with no children. Quit smoking 1986. No alcohol or illicit drug use.    Right handed    Some college.   Caffeine one cup of tea daily.    FAMILY HISTORY: Family History  Problem  Relation Age of Onset  . Hypertension Father   . Heart attack Father   . Heart disease Maternal Uncle   . Cancer Maternal Aunt        breast  . Cancer Other        kidney  . Cancer Cousin   . Colon cancer Neg Hx     Prior to Admission medications  Medication Sig Start Date End Date Taking? Authorizing Provider  allopurinol (ZYLOPRIM) 300 MG tablet Take 300 mg by mouth daily.    Yes [provider]  atorvastatin (LIPITOR) 20 MG tablet Take 20 mg by mouth at bedtime.   Yes [provider]  Calcium Carbonate-Vit D-Min (CALTRATE 600+D PLUS) 600-400 MG-UNIT per tablet Chew 1 tablet by mouth 2 (two) times daily.     Yes [provider]  Cholecalciferol (VITAMIN D) 2000 UNITS tablet Take 2,000 Units by mouth daily.    Yes [provider]  dexamethasone (DECADRON) 4 MG tablet Take 1 tab two times a day the day before Alimta chemo. Take 2 tabs two times a day starting the day after chemo for 3 days. 02/27/17  Yes Ardath Sax, MD  DULoxetine (CYMBALTA) 60 MG capsule Take 60 mg by mouth daily.   Yes [provider]  FIBER SELECT GUMMIES PO Take 2 each by mouth 2 (two) times daily.    Yes [provider]  folic acid (FOLVITE) 1 MG tablet Take 1 tablet (1 mg total) by mouth daily. Start 5-7 days before Alimta chemotherapy. Continue until 21 days after Alimta completed. 02/27/17  Yes Ardath Sax, MD  hydroxychloroquine (PLAQUENIL) 200 MG tablet Take 200 mg by mouth daily.    Yes [provider]  ipratropium (ATROVENT) 0.06 % nasal spray Place 2 sprays into both nostrils 2 (two) times daily as needed for rhinitis.   Yes [provider]  lidocaine-prilocaine (EMLA) cream Apply to affected area once 02/27/17  Yes Perlov, Marinell Blight, MD  LORazepam (ATIVAN) 0.5 MG tablet Take 1 tablet (0.5 mg total) by mouth every 6 (six) hours as needed (Nausea or vomiting). 02/27/17  Yes Ardath Sax, MD  MAGNESIUM OXIDE PO Take 500 mg by  mouth daily.    Yes [provider]  metFORMIN (GLUCOPHAGE-XR) 500 MG 24 hr tablet Take 500 mg by mouth 2 (two) times a week.   Yes [provider]  methylPREDNISolone (MEDROL) 8 MG tablet Take 8 mg by mouth daily.   Yes [provider]  morphine (MS CONTIN) 30 MG 12 hr tablet Take 1 tablet (30 mg total) by mouth every 12 (twelve) hours. 02/27/17 03/29/17 Yes Ardath Sax, MD  morphine 20 MG/5ML solution Take 1 mL (4 mg total) by mouth 2 (two) times daily as needed for pain. 03/14/17  Yes Jenika Chiem, Mathis Dad, MD  nebivolol (BYSTOLIC) 5 MG tablet Take 5 mg by mouth every evening.    Yes [provider]  omeprazole (PRILOSEC) 40 MG capsule TAKE (1) CAPSULE BY MOUTH ONCE DAILY. Patient taking differently: TAKE 40 MG BY MOUTH ONCE DAILY. 07/21/14  Yes Rehman, Mechele Dawley, MD  ondansetron (ZOFRAN) 8 MG tablet Take 1 tablet (8 mg total) by mouth 2 (two) times daily as needed for refractory nausea / vomiting. Start on day 3 after chemo. 02/27/17  Yes Perlov, Marinell Blight, MD  Polyethyl Glycol-Propyl Glycol (SYSTANE) 0.4-0.3 % GEL Apply 1 application to eye daily as needed (for dry eyes).    Yes [provider]  prochlorperazine (COMPAZINE) 10 MG tablet Take 1 tablet (10 mg total) by mouth every 6 (six) hours as needed (Nausea or vomiting). 02/27/17  Yes Perlov, Marinell Blight, MD  XARELTO 20 MG TABS tablet Take 1 tablet by mouth daily. 02/27/17  Yes [provider]     Review of Systems - Oncology reviewed, negative except for fatigue, mouth sores, leg swelling, constipation, rash, itching,  dizziness.   PHYSICAL EXAMINATION  ECOG PERFORMANCE STATUS: 2  Temp: 97.6 F (36.4 C) (01/29 1025) Temp Source: Oral (01/29 1025) BP: 134/58 (01/29 1025) Pulse Rate: 66 (01/29 1025)   Physical Exam  Constitutional: She is oriented to person, place, and time and well-developed, well-nourished, NAD. HENT:  Head: Normocephalic and atraumatic.  Mouth/Throat: Oropharynx is  clear.  No thrush.   Eyes: Conjunctivae and EOM are normal. Pupils are equal, round, and reactive to light. Neck: Thyroid midline.   Cardiovascular: regular rate and rhythm, normal heart sounds  No murmur heard. Pulmonary/Chest: CTA, no wheezes.  No respiratory distress.   Abdominal: Soft. Positive Bowel sounds.  No guarding no rebound.  NO HSM.  No distension.   Musculoskeletal:  Trace LE edema.   Lymphadenopathy: No palpable adenopathy.   Neurological: She is alert and oriented to person, place, and time. She has normal reflexes. No cranial nerve deficit.  Skin: Skin is warm and dry. No rash noted. No erythema.    LABORATORY DATA:  CBC    Component Value Date/Time   WBC 18.9 (H) 03/14/2017 1044   RBC 3.90 03/14/2017 1044   HGB 8.4 (L) 03/14/2017 1044   HCT 31.0 (L) 03/14/2017 1044   PLT 282 03/14/2017 1044   MCV 79.5 03/14/2017 1044   MCH 21.5 (L) 03/14/2017 1044   MCHC 27.1 (L) 03/14/2017 1044   RDW 18.6 (H) 03/14/2017 1044   LYMPHSABS 2.4 03/14/2017 1044   MONOABS 1.7 03/14/2017 1044   EOSABS 0.1 03/14/2017 1044   BASOSABS 0.0 03/14/2017 1044    CMP     Component Value Date/Time   NA 140 03/14/2017 1044   K 3.4 (L) 03/14/2017 1044   CL 104 03/14/2017 1044   CO2 25 03/14/2017 1044   GLUCOSE 224 (H) 03/14/2017 1044   BUN 18 03/14/2017 1044   CREATININE 0.68 03/14/2017 1044   CREATININE 1.00 (H) 02/04/2016 1440   CALCIUM 9.4 03/14/2017 1044   PROT 6.0 (L) 03/14/2017 1044   ALBUMIN 3.0 (L) 03/14/2017 1044   AST 28 03/14/2017 1044   ALT 81 (H) 03/14/2017 1044   ALKPHOS 259 (H) 03/14/2017 1044   BILITOT 0.4 03/14/2017 1044   GFRNONAA >60 03/14/2017 1044   GFRAA >60 03/14/2017 1044    RADIOGRAPHIC STUDIES:  PET-CT, 02/06/17: 1.7cm LtUL mass, SUVmax 6.1, Lt perihilar LAD, 2.0cm, SUVmax 9.3. No axillary lymphadenopathy, no abnormal enhancement in the breasts.  2.2 cm lesion in the dome of the liver, SUV max 8.0.  2.2 cm lesion in the caudate lobe of the liver, SUV  max 12.8.  Multiple hypermetabolic skeletal lesions including bilateral proximal femoral lesions.    Doppler US Lt LE, 02/25/16 positive for recurrent deep vein thrombosis.   PATHOLOGY:  Bone Bx, 02/21/17: Metastatic adenocarcinoma; IHC -- positive for CK 7, TTF-1, Napsin-A & negative for CK20, CDX-2, GATA3, PAX8 --consistent with pulmonary primary.  ASSESSMENT and THERAPY PLAN: see above   Orders Placed This Encounter  Procedures  . US BIOPSY (LIVER)    Ultrasound guided biopsy liver - PDL1 testing - attention Dr. Pascal Lux    Standing Status:   Future    Standing Expiration Date:   05/13/2018    Order Specific Question:   Lab orders requested (DO NOT place separate lab orders, these will be automatically ordered during procedure specimen collection):    Answer:   Other    Comments:   PDL1 testing    Order Specific Question:   Reason for Exam (SYMPTOM  OR DIAGNOSIS REQUIRED)    Answer:   adenocarcinoma of lung, liver lesion    Order Specific Question:   Preferred imaging location?    Answer:   [facility]    Order Specific Question:   Call Results- Best Contact Number?    Answer:   [phone]  [phone]    All questions were answered. The patient knows to call the clinic with any problems, questions or concerns. We can certainly see the patient much sooner if necessary. This note was electronically signed. Zoila Shutter, MD 03/14/2017

## 2017-03-14 NOTE — Progress Notes (Signed)
Guardant 360 drawn on pt today from her port-a-cath.  Sent via [facility].  No tissue sample available at this time to send off for testing.

## 2017-03-14 NOTE — Progress Notes (Signed)
Tx held today per MD discretion.

## 2017-03-15 ENCOUNTER — Inpatient Hospital Stay (HOSPITAL_COMMUNITY)
Admission: EM | Admit: 2017-03-15 | Discharge: 2017-03-18 | DRG: 948 | Disposition: A | Discharge status: Home / Self Care | Payer: Medicare Other | Attending: Internal Medicine | Admitting: Internal Medicine

## 2017-03-15 ENCOUNTER — Other Ambulatory Visit: Payer: Self-pay

## 2017-03-15 ENCOUNTER — Telehealth (HOSPITAL_COMMUNITY): Payer: Self-pay

## 2017-03-15 ENCOUNTER — Other Ambulatory Visit: Payer: Self-pay | Admitting: Radiology

## 2017-03-15 ENCOUNTER — Encounter (HOSPITAL_COMMUNITY): Payer: Self-pay

## 2017-03-15 DIAGNOSIS — Z8051 Family history of malignant neoplasm of kidney: Secondary | ICD-10-CM

## 2017-03-15 DIAGNOSIS — C787 Secondary malignant neoplasm of liver and intrahepatic bile duct: Secondary | ICD-10-CM | POA: Diagnosis not present

## 2017-03-15 DIAGNOSIS — Z87891 Personal history of nicotine dependence: Secondary | ICD-10-CM

## 2017-03-15 DIAGNOSIS — K219 Gastro-esophageal reflux disease without esophagitis: Secondary | ICD-10-CM | POA: Diagnosis not present

## 2017-03-15 DIAGNOSIS — C7951 Secondary malignant neoplasm of bone: Secondary | ICD-10-CM | POA: Diagnosis not present

## 2017-03-15 DIAGNOSIS — R52 Pain, unspecified: Secondary | ICD-10-CM | POA: Diagnosis present

## 2017-03-15 DIAGNOSIS — J310 Chronic rhinitis: Secondary | ICD-10-CM | POA: Diagnosis present

## 2017-03-15 DIAGNOSIS — Z7901 Long term (current) use of anticoagulants: Secondary | ICD-10-CM

## 2017-03-15 DIAGNOSIS — Z8541 Personal history of malignant neoplasm of cervix uteri: Secondary | ICD-10-CM

## 2017-03-15 DIAGNOSIS — Z8249 Family history of ischemic heart disease and other diseases of the circulatory system: Secondary | ICD-10-CM

## 2017-03-15 DIAGNOSIS — Z96651 Presence of right artificial knee joint: Secondary | ICD-10-CM | POA: Diagnosis present

## 2017-03-15 DIAGNOSIS — E119 Type 2 diabetes mellitus without complications: Secondary | ICD-10-CM | POA: Diagnosis not present

## 2017-03-15 DIAGNOSIS — Z853 Personal history of malignant neoplasm of breast: Secondary | ICD-10-CM

## 2017-03-15 DIAGNOSIS — E785 Hyperlipidemia, unspecified: Secondary | ICD-10-CM | POA: Diagnosis present

## 2017-03-15 DIAGNOSIS — C7931 Secondary malignant neoplasm of brain: Secondary | ICD-10-CM | POA: Diagnosis not present

## 2017-03-15 DIAGNOSIS — H04123 Dry eye syndrome of bilateral lacrimal glands: Secondary | ICD-10-CM | POA: Diagnosis present

## 2017-03-15 DIAGNOSIS — I10 Essential (primary) hypertension: Secondary | ICD-10-CM | POA: Diagnosis present

## 2017-03-15 DIAGNOSIS — K769 Liver disease, unspecified: Secondary | ICD-10-CM

## 2017-03-15 DIAGNOSIS — Z86010 Personal history of colonic polyps: Secondary | ICD-10-CM

## 2017-03-15 DIAGNOSIS — D649 Anemia, unspecified: Secondary | ICD-10-CM | POA: Diagnosis not present

## 2017-03-15 DIAGNOSIS — C3412 Malignant neoplasm of upper lobe, left bronchus or lung: Secondary | ICD-10-CM | POA: Diagnosis not present

## 2017-03-15 DIAGNOSIS — M109 Gout, unspecified: Secondary | ICD-10-CM | POA: Diagnosis present

## 2017-03-15 DIAGNOSIS — D638 Anemia in other chronic diseases classified elsewhere: Secondary | ICD-10-CM | POA: Diagnosis present

## 2017-03-15 DIAGNOSIS — Z8673 Personal history of transient ischemic attack (TIA), and cerebral infarction without residual deficits: Secondary | ICD-10-CM

## 2017-03-15 DIAGNOSIS — M069 Rheumatoid arthritis, unspecified: Secondary | ICD-10-CM | POA: Diagnosis present

## 2017-03-15 DIAGNOSIS — M549 Dorsalgia, unspecified: Secondary | ICD-10-CM

## 2017-03-15 DIAGNOSIS — F329 Major depressive disorder, single episode, unspecified: Secondary | ICD-10-CM | POA: Diagnosis present

## 2017-03-15 DIAGNOSIS — G893 Neoplasm related pain (acute) (chronic): Principal | ICD-10-CM | POA: Diagnosis present

## 2017-03-15 DIAGNOSIS — C3490 Malignant neoplasm of unspecified part of unspecified bronchus or lung: Secondary | ICD-10-CM | POA: Diagnosis not present

## 2017-03-15 DIAGNOSIS — Z803 Family history of malignant neoplasm of breast: Secondary | ICD-10-CM

## 2017-03-15 DIAGNOSIS — Z87442 Personal history of urinary calculi: Secondary | ICD-10-CM

## 2017-03-15 DIAGNOSIS — M545 Low back pain: Secondary | ICD-10-CM | POA: Diagnosis not present

## 2017-03-15 DIAGNOSIS — Z86718 Personal history of other venous thrombosis and embolism: Secondary | ICD-10-CM

## 2017-03-15 DIAGNOSIS — Z79891 Long term (current) use of opiate analgesic: Secondary | ICD-10-CM

## 2017-03-15 DIAGNOSIS — Z7984 Long term (current) use of oral hypoglycemic drugs: Secondary | ICD-10-CM

## 2017-03-15 HISTORY — DX: Acute embolism and thrombosis of unspecified vein: I82.90

## 2017-03-15 LAB — BASIC METABOLIC PANEL
Anion gap: 8 (ref 5–15)
BUN: 16 mg/dL (ref 6–20)
CALCIUM: 9.1 mg/dL (ref 8.9–10.3)
CO2: 28 mmol/L (ref 22–32)
CREATININE: 0.58 mg/dL (ref 0.44–1.00)
Chloride: 105 mmol/L (ref 101–111)
GFR calc Af Amer: >60 mL/min (ref >60)
GFR calc non Af Amer: >60 mL/min (ref >60)
GLUCOSE: 203 mg/dL — AB (ref 65–99)
Potassium: 3.7 mmol/L (ref 3.5–5.1)
Sodium: 141 mmol/L (ref 135–145)

## 2017-03-15 LAB — CBC
HEMATOCRIT: 31.7 % — AB (ref 36.0–46.0)
Hemoglobin: 8.9 g/dL — ABNORMAL LOW (ref 12.0–15.0)
MCH: 21.8 pg — AB (ref 26.0–34.0)
MCHC: 28.1 g/dL — AB (ref 30.0–36.0)
MCV: 77.5 fL — AB (ref 78.0–100.0)
Platelets: 239 10*3/uL (ref 150–400)
RBC: 4.09 MIL/uL (ref 3.87–5.11)
RDW: 18.7 % — AB (ref 11.5–15.5)
WBC: 15.6 10*3/uL — ABNORMAL HIGH (ref 4.0–10.5)

## 2017-03-15 LAB — URINALYSIS, ROUTINE W REFLEX MICROSCOPIC
Bilirubin Urine: NEGATIVE
GLUCOSE, UA: NEGATIVE mg/dL
HGB URINE DIPSTICK: NEGATIVE
Ketones, ur: NEGATIVE mg/dL
Nitrite: NEGATIVE
Protein, ur: NEGATIVE mg/dL
SPECIFIC GRAVITY, URINE: 1.01 (ref 1.005–1.030)
pH: 7 (ref 5.0–8.0)

## 2017-03-15 MED ORDER — ONDANSETRON HCL 4 MG/2ML IJ SOLN
4.0000 mg | Freq: Once | INTRAMUSCULAR | Status: AC
  Administered 2017-03-15: 4 mg via INTRAVENOUS
  Filled 2017-03-15: qty 2

## 2017-03-15 MED ORDER — MORPHINE SULFATE 2 MG/ML IV SOLN
INTRAVENOUS | Status: DC
  Administered 2017-03-16: 05:00:00 via INTRAVENOUS
  Administered 2017-03-16: 5.92 mg via INTRAVENOUS
  Filled 2017-03-15: qty 30

## 2017-03-15 MED ORDER — ONDANSETRON HCL 4 MG/2ML IJ SOLN: 4.0000 mg | Freq: Four times a day (QID) | INTRAMUSCULAR | Status: DC | PRN

## 2017-03-15 MED ORDER — DIPHENHYDRAMINE HCL 50 MG/ML IJ SOLN: 12.5000 mg | Freq: Four times a day (QID) | INTRAMUSCULAR | Status: DC | PRN

## 2017-03-15 MED ORDER — DIPHENHYDRAMINE HCL 12.5 MG/5ML PO ELIX: 12.5000 mg | ORAL_SOLUTION | Freq: Four times a day (QID) | ORAL | Status: DC | PRN

## 2017-03-15 MED ORDER — MORPHINE SULFATE 2 MG/ML IV SOLN: INTRAVENOUS | Status: DC

## 2017-03-15 MED ORDER — NALOXONE HCL 0.4 MG/ML IJ SOLN: 0.4000 mg | INTRAMUSCULAR | Status: DC | PRN

## 2017-03-15 MED ORDER — HYDROMORPHONE HCL 1 MG/ML IJ SOLN
1.0000 mg | Freq: Once | INTRAMUSCULAR | Status: AC
  Administered 2017-03-15: 1 mg via INTRAVENOUS
  Filled 2017-03-15: qty 1

## 2017-03-15 MED ORDER — SODIUM CHLORIDE 0.9% FLUSH: 9.0000 mL | INTRAVENOUS | Status: DC | PRN

## 2017-03-15 MED ORDER — SODIUM CHLORIDE 0.9% FLUSH
9.0000 mL | INTRAVENOUS | Status: DC | PRN
Start: 2017-03-15 — End: 2017-03-15

## 2017-03-15 NOTE — ED Notes (Signed)
Pt visitor was concerned about the orders for admitting being put in incorrectly. Assistant Director Abigail Butts went and spoke with pt and visitor, they are very understanding and friendly to staff.

## 2017-03-15 NOTE — ED Triage Notes (Signed)
Dr. Sonny Dandy (hem oc) phones prior to her arrival to tell us she is coming to E.D. Today. She has Breast Ca with bone mets with intractable pain. She is slated for radiation/and biopsy here tomorrow. Plan is to control her pain so we may proceed with radiation and biopsy. Dr. Sonny Dandy would like to be called with pt. Condition @[phone]  [phone]. He further tells [place] that Dr. Alen Blew is on today for [facility] oncology; and Dr. Lisbeth Renshaw is on for Radiation Oncology.

## 2017-03-15 NOTE — Telephone Encounter (Signed)
Patients husband called stating patient was having an increase in pain, no appetite, and was unable to move unassisted. He denies that she is having any nausea, vomiting, or fevers. Husband states he is not going to be able to get her to her appointments for biopsy or radiation tomorrow. Reviewed with provider. Instructed husband to take patient to ER. He states he is going to take her to the ER at [facility]. Patient is scheduled for biopsy by IR at [facility] tomorrow. Called and spoke with Audelia Acton in IR to let him know what is going on with patient. Audelia Acton states as long as patient remains NPO and holds xarelto then she can probably have the biopsy as an inpatient. The outpatient order will need to be cancelled and the hospitalist for the patient will need to order the inpatient biopsy order. Oncologist made aware of above.

## 2017-03-15 NOTE — ED Notes (Signed)
Patient has a port and rather have her port accessed. Triage Nurse Aaron Edelman aware.

## 2017-03-15 NOTE — ED Provider Notes (Signed)
[facility] DEPT Provider Note   CSN: [id number] Arrival date & time: 03/15/17  1729     History   Chief Complaint Chief Complaint  Patient presents with  . Back Pain    HPI [name] is a 67 y.o. female.  The history is provided by the patient.  She has history of metastatic lung cancer, diabetes, GERD, heart murmur, hyperlipidemia, hypertension, rheumatoid arthritis, stroke and comes in with intractable back pain with pain also in her hips and knees.  Pain is been present for about the last 6 weeks since she was diagnosed with lung cancer.  Pain is worse when she tries to sleep, worse when she stands and better if she lays still.  At rest, pain is rated at 6/10.  However, this is in spite of 30 mg of morphine twice a day.  She has been unable to get control of pain with increasing doses of narcotics at home.  She denies any weakness or numbness.  She denies any bowel or bladder dysfunction.  She was sent here for hospital admission to try to achieve pain control.  Of note, she is scheduled to start radiation treatments tomorrow, and is also scheduled for a bone biopsy tomorrow.  She is also anticoagulated because of recurrent DVT.  Past Medical History:  Diagnosis Date  . Anemia   . Angina   . Arthritis    Rheumatoid  . Brain cancer ([place])   . Breast cancer ([place])   . Cancer ([place])    left breast, cervical cancer  . Colon polyps   . Constipation   . Diabetes mellitus without complication ([place])   . Diarrhea   . Dysrhythmia   . GERD (gastroesophageal reflux disease)   . Headache   . Heart murmur   . History of kidney stones   . Hypercholesterolemia   . Hypertension   . Lung cancer ([place])   . Numbness and tingling in right hand   . Numbness and tingling of both feet   . Peripheral vascular disease ([place])    cerebrel aneurysm /sp coil  . Pneumonia   . PONV (postoperative nausea and vomiting)   . Raynaud's syndrome   . Rheumatoid  arthritis with rheumatoid factor (HCC)   . Right knee DJD   . Stroke [facility])    jan 2012    Patient Active Problem List   Diagnosis Date Noted  . Bone metastasis ([place]) 03/13/2017  . Palliative care by specialist   . DNR (do not resuscitate)   . Primary cancer of left upper lobe of lung ([place]) 02/27/2017  . Secondary malignant neoplasm of brain and spinal cord ([place]) 02/15/2017  . Venous insufficiency   . Venous intermittent claudication   . Diverticulitis of colon (without mention of hemorrhage)(562.11) 08/26/2013  . Postoperative anemia due to acute blood loss 02/26/2013  . Anemia, unspecified 02/26/2013  . DJD (degenerative joint disease) of knee 02/25/2013  . PONV (postoperative nausea and vomiting)   . Stroke ([place])   . Hypercholesterolemia   . Metastatic cancer ([place])   . Colon polyps   . Arthritis   . Rheumatoid arthritis with rheumatoid factor (HCC)   . Right knee DJD   . Asthenia 08/14/2012  . Cerebral aneurysm, nonruptured 08/14/2012  . TIA (transient ischemic attack) 08/14/2012  . RA (rheumatoid arthritis) 10/27/2011  . High cholesterol 10/27/2011  . Hypertension 10/27/2011  . GERD (gastroesophageal reflux disease) 10/27/2011    Past Surgical History:  Procedure Laterality Date  .  ABDOMINAL HYSTERECTOMY  1977  . APPENDECTOMY  1964  . BILATERAL OOPHORECTOMY    . CHOLECYSTECTOMY    . COLONOSCOPY  12/09/2010   Procedure: COLONOSCOPY;  Surgeon: Rogene Houston, MD;  [facility];  Service: Endoscopy;  Laterality: N/A;  . COLONOSCOPY N/A 06/26/2015   Procedure: COLONOSCOPY;  Surgeon: Rogene Houston, MD;  Location[facility];  Service: Endoscopy;  Laterality: N/A;  1:05-moved to [address] to notify pt  . ESOPHAGOGASTRODUODENOSCOPY N/A 06/26/2015   Procedure: ESOPHAGOGASTRODUODENOSCOPY (EGD);  Surgeon: Rogene Houston, MD;  Location[facility];  Service: Endoscopy;  Laterality: N/A;  . EYE SURGERY     cataract surgery bilat   . GIVENS CAPSULE STUDY N/A  08/20/2015   Procedure: GIVENS CAPSULE STUDY;  Surgeon: Rogene Houston, MD;  Location[facility];  Service: Endoscopy;  Laterality: N/A;  730  . IR FLUORO GUIDE PORT INSERTION RIGHT  03/08/2017  . IR US GUIDE VASC ACCESS RIGHT  03/08/2017  . left breast lumpectomy  2004  . left knee arthroscopy    . Right brain aneurysm  04/2010   Coiled   . right wrist surgery  1999   synevectomy  . TOTAL KNEE ARTHROPLASTY Right 02/25/2013   Procedure: TOTAL KNEE ARTHROPLASTY;  Surgeon: Lorn Junes, MD;  Location: [facility];  Service: Orthopedics;  Laterality: Right;    OB History    Gravida Para Term Preterm AB Living             0   SAB TAB Ectopic Multiple Live Births                   Home Medications    Prior to Admission medications   Medication Sig Start Date End Date Taking? Authorizing Provider  allopurinol (ZYLOPRIM) 300 MG tablet Take 300 mg by mouth daily.     [provider]  atorvastatin (LIPITOR) 20 MG tablet Take 20 mg by mouth at bedtime.    [provider]  Calcium Carbonate-Vit D-Min (CALTRATE 600+D PLUS) 600-400 MG-UNIT per tablet Chew 1 tablet by mouth 2 (two) times daily.      [provider]  Cholecalciferol (VITAMIN D) 2000 UNITS tablet Take 2,000 Units by mouth daily.     [provider]  dexamethasone (DECADRON) 4 MG tablet Take 1 tab two times a day the day before Alimta chemo. Take 2 tabs two times a day starting the day after chemo for 3 days. 02/27/17   Ardath Sax, MD  DULoxetine (CYMBALTA) 60 MG capsule Take 60 mg by mouth daily.    [provider]  FIBER SELECT GUMMIES PO Take 2 each by mouth 2 (two) times daily.     [provider]  folic acid (FOLVITE) 1 MG tablet Take 1 tablet (1 mg total) by mouth daily. Start 5-7 days before Alimta chemotherapy. Continue until 21 days after Alimta completed. 02/27/17   Ardath Sax, MD  hydroxychloroquine (PLAQUENIL) 200 MG tablet Take 200 mg by mouth daily.      [provider]  ipratropium (ATROVENT) 0.06 % nasal spray Place 2 sprays into both nostrils 2 (two) times daily as needed for rhinitis.    [provider]  lidocaine-prilocaine (EMLA) cream Apply to affected area once 02/27/17   Perlov, Marinell Blight, MD  LORazepam (ATIVAN) 0.5 MG tablet Take 1 tablet (0.5 mg total) by mouth every 6 (six) hours as needed (Nausea or vomiting). 02/27/17   Perlov,  Marinell Blight, MD  MAGNESIUM OXIDE PO Take 500 mg by mouth daily.     [provider]  metFORMIN (GLUCOPHAGE-XR) 500 MG 24 hr tablet Take 500 mg by mouth 2 (two) times a week. No specific days    [provider]  methylPREDNISolone (MEDROL) 8 MG tablet Take 8 mg by mouth daily.    [provider]  morphine (MS CONTIN) 30 MG 12 hr tablet Take 1 tablet (30 mg total) by mouth every 12 (twelve) hours. 02/27/17 03/29/17  Ardath Sax, MD  morphine 20 MG/5ML solution Take 1 mL (4 mg total) by mouth 2 (two) times daily as needed for pain. 03/14/17   Higgs, Mathis Dad, MD  nebivolol (BYSTOLIC) 5 MG tablet Take 5 mg by mouth every evening.     [provider]  omeprazole (PRILOSEC) 40 MG capsule TAKE (1) CAPSULE BY MOUTH ONCE DAILY. Patient taking differently: TAKE 40 MG BY MOUTH ONCE DAILY. 07/21/14   Rehman, Mechele Dawley, MD  ondansetron (ZOFRAN) 8 MG tablet Take 1 tablet (8 mg total) by mouth 2 (two) times daily as needed for refractory nausea / vomiting. Start on day 3 after chemo. 02/27/17   Ardath Sax, MD  Polyethyl Glycol-Propyl Glycol (SYSTANE) 0.4-0.3 % GEL Apply 1 application to eye daily as needed (for dry eyes).     [provider]  prochlorperazine (COMPAZINE) 10 MG tablet Take 1 tablet (10 mg total) by mouth every 6 (six) hours as needed (Nausea or vomiting). 02/27/17   Perlov, Marinell Blight, MD  XARELTO 20 MG TABS tablet Take 1 tablet by mouth daily. 02/27/17   [provider]    Family History Family History  Problem Relation Age of Onset  .  Hypertension Father   . Heart attack Father   . Heart disease Maternal Uncle   . Cancer Maternal Aunt        breast  . Cancer Other        kidney  . Cancer Cousin   . Colon cancer Neg Hx     Social History Social History   Tobacco Use  . Smoking status: Former Smoker    Packs/day: 2.00    Years: 15.00    Pack years: 30.00    Last attempt to quit: 02/20/1984    Years since quitting: 33.0  . Smokeless tobacco: Never Used  . Tobacco comment: Quit 1986  Substance Use Topics  . Alcohol use: No  . Drug use: No     Allergies   Aurothioglucose [solganal]; Feldene [piroxicam]; Hydralazine; Imdur [isosorbide mononitrate]; Imuran [azathioprine]; Methotrexate derivatives; Calcium channel blockers; Sulfa antibiotics; Thimerosal; Vioxx [rofecoxib]; Arava [leflunomide]; Asacol [mesalamine]; Iohexol; Hydrocodone; Latex; Neomycin; Nickel; Nsaids; and Percocet [oxycodone-acetaminophen]   Review of Systems Review of Systems  All other systems reviewed and are negative.    Physical Exam Updated Vital Signs BP (!) 153/61 (BP Location: Right Arm)   Pulse 71   Temp 98.4 F (36.9 C) (Oral)   Resp 16   Ht 5\' 4"  (1.626 m)   Wt 75.3 kg (166 lb)   SpO2 99%   BMI 28.49 kg/m   Physical Exam  Nursing note and vitals reviewed.  67 year old female, resting comfortably and in no acute distress. Vital signs are significant for elevated blood pressure. Oxygen saturation is 99%, which is normal. Head is normocephalic and atraumatic. PERRLA, EOMI. Oropharynx is clear. Neck is nontender and supple without adenopathy or JVD. Back is nontender and there is no CVA tenderness.  Lungs are clear without rales, wheezes, or rhonchi. Chest is nontender. Heart has regular rate and rhythm with 3-3/0 systolic ejection murmur best heard along the left sternal border and at the cardiac apex. Abdomen is soft, flat, nontender without masses or hepatosplenomegaly and peristalsis is normoactive. Extremities have  1+ edema, full range of motion is present. Skin is warm and dry without rash. Neurologic: Mental status is normal, cranial nerves are intact, there are no motor or sensory deficits.  ED Treatments / Results  Labs (all labs ordered are listed, but only abnormal results are displayed) Labs Reviewed  URINALYSIS, ROUTINE W REFLEX MICROSCOPIC  CBC  BASIC METABOLIC PANEL    Procedures Procedures (including critical care time)  Medications Ordered in ED Medications  HYDROmorphone (DILAUDID) injection 1 mg (not administered)  ondansetron (ZOFRAN) injection 4 mg (not administered)     Initial Impression / Assessment and Plan / ED Course  I have reviewed the triage vital signs and the nursing notes.  Pertinent lab results that were available during my care of the patient were reviewed by me and considered in my medical decision making (see chart for details).  Intractable mid and lower back pain secondary to metastatic lung cancer.  Old records are reviewed confirming recent diagnosis of lung cancer and attempts at pain control.  I discussed the case with 1 however oncologist, Dr. Sonny Dandy, who recommended that she be admitted for intravenous morphine drip.  Case is discussed with Dr. Aggie Moats of [facility], who agrees to admit the patient.  Final Clinical Impressions(s) / ED Diagnoses   Final diagnoses:  Intractable back pain  Primary malignant neoplasm of lung metastatic to other site, unspecified laterality [facility])  Normocytic anemia    ED Discharge Orders    None       Delora Fuel, MD 07/62/26 2221

## 2017-03-16 ENCOUNTER — Other Ambulatory Visit: Payer: Self-pay

## 2017-03-16 ENCOUNTER — Ambulatory Visit: Payer: Medicare Other | Admitting: Radiation Oncology

## 2017-03-16 ENCOUNTER — Ambulatory Visit (HOSPITAL_COMMUNITY)
Admission: RE | Admit: 2017-03-16 | Discharge: 2017-03-16 | Disposition: A | Discharge status: Home / Self Care | Payer: Medicare Other | Source: Ambulatory Visit | Attending: Internal Medicine | Admitting: Internal Medicine

## 2017-03-16 ENCOUNTER — Telehealth (HOSPITAL_COMMUNITY): Payer: Self-pay | Admitting: Emergency Medicine

## 2017-03-16 ENCOUNTER — Encounter (HOSPITAL_COMMUNITY): Payer: Self-pay | Admitting: Family Medicine

## 2017-03-16 ENCOUNTER — Encounter (HOSPITAL_COMMUNITY): Payer: Self-pay

## 2017-03-16 ENCOUNTER — Observation Stay (HOSPITAL_COMMUNITY): Payer: Medicare Other

## 2017-03-16 DIAGNOSIS — M109 Gout, unspecified: Secondary | ICD-10-CM | POA: Diagnosis present

## 2017-03-16 DIAGNOSIS — Z86718 Personal history of other venous thrombosis and embolism: Secondary | ICD-10-CM | POA: Diagnosis not present

## 2017-03-16 DIAGNOSIS — C3490 Malignant neoplasm of unspecified part of unspecified bronchus or lung: Secondary | ICD-10-CM | POA: Diagnosis not present

## 2017-03-16 DIAGNOSIS — C7931 Secondary malignant neoplasm of brain: Secondary | ICD-10-CM | POA: Diagnosis not present

## 2017-03-16 DIAGNOSIS — D638 Anemia in other chronic diseases classified elsewhere: Secondary | ICD-10-CM | POA: Diagnosis present

## 2017-03-16 DIAGNOSIS — Z96651 Presence of right artificial knee joint: Secondary | ICD-10-CM | POA: Diagnosis present

## 2017-03-16 DIAGNOSIS — M549 Dorsalgia, unspecified: Secondary | ICD-10-CM | POA: Diagnosis not present

## 2017-03-16 DIAGNOSIS — C7951 Secondary malignant neoplasm of bone: Secondary | ICD-10-CM | POA: Diagnosis not present

## 2017-03-16 DIAGNOSIS — F329 Major depressive disorder, single episode, unspecified: Secondary | ICD-10-CM | POA: Diagnosis present

## 2017-03-16 DIAGNOSIS — Z8673 Personal history of transient ischemic attack (TIA), and cerebral infarction without residual deficits: Secondary | ICD-10-CM | POA: Diagnosis not present

## 2017-03-16 DIAGNOSIS — C3412 Malignant neoplasm of upper lobe, left bronchus or lung: Secondary | ICD-10-CM | POA: Diagnosis not present

## 2017-03-16 DIAGNOSIS — Z87442 Personal history of urinary calculi: Secondary | ICD-10-CM | POA: Diagnosis not present

## 2017-03-16 DIAGNOSIS — E785 Hyperlipidemia, unspecified: Secondary | ICD-10-CM | POA: Diagnosis present

## 2017-03-16 DIAGNOSIS — I10 Essential (primary) hypertension: Secondary | ICD-10-CM | POA: Diagnosis present

## 2017-03-16 DIAGNOSIS — E119 Type 2 diabetes mellitus without complications: Secondary | ICD-10-CM | POA: Diagnosis present

## 2017-03-16 DIAGNOSIS — G893 Neoplasm related pain (acute) (chronic): Secondary | ICD-10-CM | POA: Diagnosis not present

## 2017-03-16 DIAGNOSIS — Z87891 Personal history of nicotine dependence: Secondary | ICD-10-CM | POA: Diagnosis not present

## 2017-03-16 DIAGNOSIS — M069 Rheumatoid arthritis, unspecified: Secondary | ICD-10-CM | POA: Diagnosis not present

## 2017-03-16 DIAGNOSIS — K7689 Other specified diseases of liver: Secondary | ICD-10-CM | POA: Diagnosis not present

## 2017-03-16 DIAGNOSIS — C787 Secondary malignant neoplasm of liver and intrahepatic bile duct: Secondary | ICD-10-CM | POA: Diagnosis not present

## 2017-03-16 DIAGNOSIS — Z8541 Personal history of malignant neoplasm of cervix uteri: Secondary | ICD-10-CM | POA: Diagnosis not present

## 2017-03-16 DIAGNOSIS — H04123 Dry eye syndrome of bilateral lacrimal glands: Secondary | ICD-10-CM | POA: Diagnosis present

## 2017-03-16 DIAGNOSIS — Z8249 Family history of ischemic heart disease and other diseases of the circulatory system: Secondary | ICD-10-CM | POA: Diagnosis not present

## 2017-03-16 DIAGNOSIS — C229 Malignant neoplasm of liver, not specified as primary or secondary: Secondary | ICD-10-CM | POA: Diagnosis not present

## 2017-03-16 DIAGNOSIS — D649 Anemia, unspecified: Secondary | ICD-10-CM | POA: Diagnosis not present

## 2017-03-16 DIAGNOSIS — Z5111 Encounter for antineoplastic chemotherapy: Secondary | ICD-10-CM | POA: Diagnosis not present

## 2017-03-16 DIAGNOSIS — K219 Gastro-esophageal reflux disease without esophagitis: Secondary | ICD-10-CM | POA: Diagnosis present

## 2017-03-16 DIAGNOSIS — Z853 Personal history of malignant neoplasm of breast: Secondary | ICD-10-CM | POA: Diagnosis not present

## 2017-03-16 DIAGNOSIS — Z86010 Personal history of colonic polyps: Secondary | ICD-10-CM | POA: Diagnosis not present

## 2017-03-16 DIAGNOSIS — J310 Chronic rhinitis: Secondary | ICD-10-CM | POA: Diagnosis present

## 2017-03-16 LAB — GLUCOSE, CAPILLARY
GLUCOSE-CAPILLARY: 101 mg/dL — AB (ref 65–99)
GLUCOSE-CAPILLARY: 89 mg/dL (ref 65–99)
Glucose-Capillary: 110 mg/dL — ABNORMAL HIGH (ref 65–99)
Glucose-Capillary: 141 mg/dL — ABNORMAL HIGH (ref 65–99)
Glucose-Capillary: 167 mg/dL — ABNORMAL HIGH (ref 65–99)
Glucose-Capillary: 88 mg/dL (ref 65–99)

## 2017-03-16 LAB — COMPREHENSIVE METABOLIC PANEL
ALBUMIN: 3.1 g/dL — AB (ref 3.5–5.0)
ALK PHOS: 271 U/L — AB (ref 38–126)
ALT: 58 U/L — ABNORMAL HIGH (ref 14–54)
ANION GAP: 9 (ref 5–15)
AST: 23 U/L (ref 15–41)
BUN: 16 mg/dL (ref 6–20)
CHLORIDE: 105 mmol/L (ref 101–111)
CO2: 27 mmol/L (ref 22–32)
Calcium: 8.8 mg/dL — ABNORMAL LOW (ref 8.9–10.3)
Creatinine, Ser: 0.6 mg/dL (ref 0.44–1.00)
GFR calc Af Amer: >60 mL/min (ref >60)
GFR calc non Af Amer: >60 mL/min (ref >60)
GLUCOSE: 145 mg/dL — AB (ref 65–99)
POTASSIUM: 3.8 mmol/L (ref 3.5–5.1)
SODIUM: 141 mmol/L (ref 135–145)
Total Bilirubin: 0.7 mg/dL (ref 0.3–1.2)
Total Protein: 6.2 g/dL — ABNORMAL LOW (ref 6.5–8.1)

## 2017-03-16 LAB — CBC
HEMATOCRIT: 31.3 % — AB (ref 36.0–46.0)
HEMOGLOBIN: 8.8 g/dL — AB (ref 12.0–15.0)
MCH: 21.9 pg — ABNORMAL LOW (ref 26.0–34.0)
MCHC: 28.1 g/dL — ABNORMAL LOW (ref 30.0–36.0)
MCV: 77.9 fL — ABNORMAL LOW (ref 78.0–100.0)
Platelets: 253 10*3/uL (ref 150–400)
RBC: 4.02 MIL/uL (ref 3.87–5.11)
RDW: 18.7 % — ABNORMAL HIGH (ref 11.5–15.5)
WBC: 14.4 10*3/uL — ABNORMAL HIGH (ref 4.0–10.5)

## 2017-03-16 LAB — APTT: aPTT: 31 seconds (ref 24–36)

## 2017-03-16 LAB — PROTIME-INR
INR: 1.47
PROTHROMBIN TIME: 17.7 s — AB (ref 11.4–15.2)

## 2017-03-16 MED ORDER — MIDAZOLAM HCL 2 MG/2ML IJ SOLN
INTRAMUSCULAR | Status: AC
  Filled 2017-03-16: qty 2

## 2017-03-16 MED ORDER — MORPHINE SULFATE ER 30 MG PO TBCR
60.0000 mg | EXTENDED_RELEASE_TABLET | Freq: Two times a day (BID) | ORAL | Status: DC
  Administered 2017-03-16 – 2017-03-17 (×2): 60 mg via ORAL
  Filled 2017-03-16 (×2): qty 2

## 2017-03-16 MED ORDER — POLYVINYL ALCOHOL 1.4 % OP SOLN
1.0000 [drp] | Freq: Every day | OPHTHALMIC | Status: DC | PRN
  Filled 2017-03-16: qty 15

## 2017-03-16 MED ORDER — LORAZEPAM 0.5 MG PO TABS: 0.5000 mg | ORAL_TABLET | Freq: Four times a day (QID) | ORAL | Status: DC | PRN

## 2017-03-16 MED ORDER — PANTOPRAZOLE SODIUM 40 MG PO TBEC
40.0000 mg | DELAYED_RELEASE_TABLET | Freq: Every day | ORAL | Status: DC
  Administered 2017-03-17 – 2017-03-18 (×2): 40 mg via ORAL
  Filled 2017-03-16 (×2): qty 1

## 2017-03-16 MED ORDER — SODIUM CHLORIDE 0.9 % IV SOLN
INTRAVENOUS | Status: DC
  Administered 2017-03-16: 10 mL/h via INTRAVENOUS
  Administered 2017-03-17: 07:00:00 via INTRAVENOUS

## 2017-03-16 MED ORDER — ALLOPURINOL 300 MG PO TABS
300.0000 mg | ORAL_TABLET | Freq: Every day | ORAL | Status: DC
Start: 2017-03-16 — End: 2017-03-18
  Administered 2017-03-17 – 2017-03-18 (×2): 300 mg via ORAL
  Filled 2017-03-16 (×2): qty 1

## 2017-03-16 MED ORDER — ONDANSETRON HCL 4 MG PO TABS: 4.0000 mg | ORAL_TABLET | Freq: Four times a day (QID) | ORAL | Status: DC | PRN

## 2017-03-16 MED ORDER — ACETAMINOPHEN 650 MG RE SUPP: 650.0000 mg | Freq: Four times a day (QID) | RECTAL | Status: DC | PRN

## 2017-03-16 MED ORDER — HYDROXYCHLOROQUINE SULFATE 200 MG PO TABS
200.0000 mg | ORAL_TABLET | Freq: Every day | ORAL | Status: DC
  Administered 2017-03-17 – 2017-03-18 (×2): 200 mg via ORAL
  Filled 2017-03-16 (×3): qty 1

## 2017-03-16 MED ORDER — DULOXETINE HCL 60 MG PO CPEP
60.0000 mg | ORAL_CAPSULE | Freq: Every day | ORAL | Status: DC
  Administered 2017-03-16 – 2017-03-18 (×3): 60 mg via ORAL
  Filled 2017-03-16 (×3): qty 1

## 2017-03-16 MED ORDER — NEBIVOLOL HCL 5 MG PO TABS
5.0000 mg | ORAL_TABLET | Freq: Every evening | ORAL | Status: DC
  Administered 2017-03-16: 5 mg via ORAL
  Filled 2017-03-16 (×2): qty 1

## 2017-03-16 MED ORDER — ONDANSETRON HCL 4 MG/2ML IJ SOLN: 4.0000 mg | Freq: Four times a day (QID) | INTRAMUSCULAR | Status: DC | PRN

## 2017-03-16 MED ORDER — ATORVASTATIN CALCIUM 20 MG PO TABS
20.0000 mg | ORAL_TABLET | Freq: Every day | ORAL | Status: DC
Start: 2017-03-16 — End: 2017-03-18
  Administered 2017-03-16 – 2017-03-17 (×2): 20 mg via ORAL
  Filled 2017-03-16 (×2): qty 1

## 2017-03-16 MED ORDER — INSULIN ASPART 100 UNIT/ML ~~LOC~~ SOLN
0.0000 [IU] | SUBCUTANEOUS | Status: DC
  Administered 2017-03-16 (×2): 3 [IU] via SUBCUTANEOUS
  Administered 2017-03-17 – 2017-03-18 (×4): 2 [IU] via SUBCUTANEOUS

## 2017-03-16 MED ORDER — FENTANYL CITRATE (PF) 100 MCG/2ML IJ SOLN
INTRAMUSCULAR | Status: AC
  Filled 2017-03-16: qty 2

## 2017-03-16 MED ORDER — MIDAZOLAM HCL 2 MG/2ML IJ SOLN
INTRAMUSCULAR | Status: AC | PRN
  Administered 2017-03-16 (×2): 1 mg via INTRAVENOUS

## 2017-03-16 MED ORDER — ACETAMINOPHEN 325 MG PO TABS: 650.0000 mg | ORAL_TABLET | Freq: Four times a day (QID) | ORAL | Status: DC | PRN

## 2017-03-16 MED ORDER — SODIUM CHLORIDE 0.9 % IV SOLN
INTRAVENOUS | Status: AC
  Administered 2017-03-16 (×2): via INTRAVENOUS

## 2017-03-16 MED ORDER — FOLIC ACID 1 MG PO TABS
1.0000 mg | ORAL_TABLET | Freq: Every day | ORAL | Status: DC
  Administered 2017-03-17 – 2017-03-18 (×2): 1 mg via ORAL
  Filled 2017-03-16 (×2): qty 1

## 2017-03-16 MED ORDER — LIDOCAINE HCL 1 % IJ SOLN
INTRAMUSCULAR | Status: AC
  Filled 2017-03-16: qty 10

## 2017-03-16 MED ORDER — FENTANYL CITRATE (PF) 100 MCG/2ML IJ SOLN
INTRAMUSCULAR | Status: AC | PRN
  Administered 2017-03-16 (×2): 50 ug via INTRAVENOUS

## 2017-03-16 MED ORDER — LIDOCAINE HCL (PF) 1 % IJ SOLN
INTRAMUSCULAR | Status: AC | PRN
  Administered 2017-03-16: 20 mL

## 2017-03-16 MED ORDER — MAGNESIUM OXIDE 400 (241.3 MG) MG PO TABS
400.0000 mg | ORAL_TABLET | Freq: Every day | ORAL | Status: DC
  Administered 2017-03-17 – 2017-03-18 (×2): 400 mg via ORAL
  Filled 2017-03-16 (×2): qty 1

## 2017-03-16 MED ORDER — MORPHINE SULFATE 2 MG/ML IV SOLN
INTRAVENOUS | Status: DC
  Administered 2017-03-17: 0 mg via INTRAVENOUS
  Administered 2017-03-17: 3.68 mg via INTRAVENOUS
  Administered 2017-03-17: 2 mg via INTRAVENOUS

## 2017-03-16 MED ORDER — METHYLPREDNISOLONE 2 MG PO TABS
8.0000 mg | ORAL_TABLET | Freq: Every day | ORAL | Status: DC
  Administered 2017-03-17 – 2017-03-18 (×2): 8 mg via ORAL
  Filled 2017-03-16 (×3): qty 4

## 2017-03-16 MED ORDER — IPRATROPIUM BROMIDE 0.06 % NA SOLN
2.0000 | Freq: Three times a day (TID) | NASAL | Status: DC
  Administered 2017-03-16: 2 via NASAL
  Filled 2017-03-16: qty 15

## 2017-03-16 MED ORDER — SODIUM CHLORIDE 0.9% FLUSH: 10.0000 mL | INTRAVENOUS | Status: DC | PRN

## 2017-03-16 NOTE — Telephone Encounter (Signed)
Patient did get admitted for pain control at [facility]. Called and spoke with Tiffany at [facility] IR. She states patient is going to have her biopsy today.

## 2017-03-16 NOTE — Progress Notes (Signed)
Patient was scheduled for outpatient liver biopsy at [facility] today, 03/16/17, but was admitted to [facility] last night.  We will request liver biopsy be performed as an inpatient while she is here at [facility] to prevent further delay of diagnosis and treatment.  We greatly appreciate IRs help in accommodating this request.    Nicholos Johns, PA-C

## 2017-03-16 NOTE — Progress Notes (Signed)
Nutrition Brief Note  Patient identified on the Malnutrition Screening Tool (MST) Report  Wt Readings from Last 3 Encounters:  03/16/17 159 lb 9.6 oz (72.4 kg)  03/14/17 166 lb 12.8 oz (75.7 kg)  03/13/17 166 lb 8 oz (75.5 kg)   Body mass index is 24.63 kg/m. Patient meets criteria for normal based on current BMI.   Current diet order is regular. Diet just advanced. Labs and medications reviewed.   No nutrition interventions warranted at this time. If nutrition issues arise, please consult RD.   Clayton Bibles, MS, RD, [place] Dietitian Pager: [phone] After Hours Pager: [phone]

## 2017-03-16 NOTE — Procedures (Signed)
Met lung ca  S/p RT LIVER BX  NO COMP STABLE EBL 0 PATH PENDING FULL REPORT IN PACS

## 2017-03-16 NOTE — H&P (Addendum)
[facility] History and Physical  LASHAWNDA HANCOX CNO:[id number] DOB: [birth year] DOA: 03/15/2017  Referring physician:  PCP: Jani Gravel, MD   Chief Complaint: "I'm hurting."  HPI: [name] is a 67 y.o. female  Past medical history significant for rheumatoid arthritis, brain cancer, breast cancer, diabetes presents the emergency room with chief complaint of back pain.  Patient sent over by her oncologist for pain control.  She has scheduled for a liver biopsy tomorrow.  Patient is on chronic opioids.  Difficulty with controlling her pain as outpatient.  Course: Given Dilaudid.  Patient had bowel pain relief.  Hospitalist paged to admit.   Review of Systems:  As per HPI otherwise 10 point review of systems negative.    Past Medical History:  Diagnosis Date  . Anemia   . Angina   . Arthritis    Rheumatoid  . Brain cancer ([place])   . Breast cancer ([place])   . Cancer ([place])    left breast, cervical cancer  . Colon polyps   . Constipation   . Diabetes mellitus without complication ([place])   . Diarrhea   . Dysrhythmia   . GERD (gastroesophageal reflux disease)   . Headache   . Heart murmur   . History of kidney stones   . Hypercholesterolemia   . Hypertension   . Lung cancer ([place])   . Numbness and tingling in right hand   . Numbness and tingling of both feet   . Peripheral vascular disease ([place])    cerebrel aneurysm /sp coil  . Pneumonia   . PONV (postoperative nausea and vomiting)   . Raynaud's syndrome   . Rheumatoid arthritis with rheumatoid factor (HCC)   . Right knee DJD   . Stroke [facility])    jan 2012   Past Surgical History:  Procedure Laterality Date  . ABDOMINAL HYSTERECTOMY  1977  . APPENDECTOMY  1964  . BILATERAL OOPHORECTOMY    . CHOLECYSTECTOMY    . COLONOSCOPY  12/09/2010   Procedure: COLONOSCOPY;  Surgeon: Rogene Houston, MD;  [facility];  Service: Endoscopy;  Laterality: N/A;  . COLONOSCOPY N/A 06/26/2015   Procedure: COLONOSCOPY;   Surgeon: Rogene Houston, MD;  [facility];  Service: Endoscopy;  Laterality: N/A;  1:05-moved to [address] to notify pt  . ESOPHAGOGASTRODUODENOSCOPY N/A 06/26/2015   Procedure: ESOPHAGOGASTRODUODENOSCOPY (EGD);  Surgeon: Rogene Houston, MD;  [facility];  Service: Endoscopy;  Laterality: N/A;  . EYE SURGERY     cataract surgery bilat   . GIVENS CAPSULE STUDY N/A 08/20/2015   Procedure: GIVENS CAPSULE STUDY;  Surgeon: Rogene Houston, MD;  [facility];  Service: Endoscopy;  Laterality: N/A;  730  . IR FLUORO GUIDE PORT INSERTION RIGHT  03/08/2017  . IR US GUIDE VASC ACCESS RIGHT  03/08/2017  . left breast lumpectomy  2004  . left knee arthroscopy    . Right brain aneurysm  04/2010   Coiled   . right wrist surgery  1999   synevectomy  . TOTAL KNEE ARTHROPLASTY Right 02/25/2013   Procedure: TOTAL KNEE ARTHROPLASTY;  Surgeon: Lorn Junes, MD;  Location: [place];  Service: Orthopedics;  Laterality: Right;   Social History:  reports that she quit smoking about 33 years ago. She has a 30.00 pack-year smoking history. she has never used smokeless tobacco. She reports that she does not drink alcohol or use drugs.  Allergies  Allergen Reactions  . Aurothioglucose [Solganal]  Other (See Comments)    REACTION: VASCULITIS   . Feldene [Piroxicam] Anaphylaxis and Other (See Comments)    SKIN BLISTERS  . Hydralazine Other (See Comments)    REACTION: Chest pains and headaches: IV only  . Imdur [Isosorbide Mononitrate] Other (See Comments)    REACTION: PATIENT IS UNABLE TO WALK OR FUNCTION  . Imuran [Azathioprine] Other (See Comments)    Could not walk or function  . Methotrexate Derivatives Other (See Comments)    REACTION: VASCULITIS  . Calcium Channel Blockers Palpitations  . Sulfa Antibiotics Diarrhea and Nausea And Vomiting  . Thimerosal Other (See Comments)    SKIN BLISTERED  . Vioxx [Rofecoxib] Other (See Comments)    REACTION: CAUSED MORE JOINT PAIN  .  Arava [Leflunomide]     UNSPECIFIED REACTION   . Asacol [Mesalamine]     UNSPECIFIED REACTION   . Iohexol Other (See Comments)    PT STATES SHE WAS TOLD NEVER TO HAVE CT CONTRAST AGAIN.  SHE HAD SOME KIND OF REACTION BUT DOESNT REMEBER WHAT HAPPENED   . Hydrocodone Itching  . Latex Rash  . Neomycin Rash  . Nickel Rash  . Nsaids Other (See Comments)    GI UPSET , CAN TOLERATE SOME NSAIDS  . Percocet [Oxycodone-Acetaminophen] Other (See Comments)    INSOMNIA    Family History  Problem Relation Age of Onset  . Hypertension Father   . Heart attack Father   . Heart disease Maternal Uncle   . Cancer Maternal Aunt        breast  . Cancer Other        kidney  . Cancer Cousin   . Colon cancer Neg Hx      Prior to Admission medications   Medication Sig Start Date End Date Taking? Authorizing Provider  allopurinol (ZYLOPRIM) 300 MG tablet Take 300 mg by mouth daily.    Yes [provider]  atorvastatin (LIPITOR) 20 MG tablet Take 20 mg by mouth at bedtime.   Yes [provider]  Calcium Carbonate-Vit D-Min (CALTRATE 600+D PLUS) 600-400 MG-UNIT per tablet Chew 1 tablet by mouth 2 (two) times daily.     Yes [provider]  Cholecalciferol (VITAMIN D) 2000 UNITS tablet Take 2,000 Units by mouth daily.    Yes [provider]  dexamethasone (DECADRON) 4 MG tablet Take 1 tab two times a day the day before Alimta chemo. Take 2 tabs two times a day starting the day after chemo for 3 days. 02/27/17  Yes Ardath Sax, MD  DULoxetine (CYMBALTA) 60 MG capsule Take 60 mg by mouth daily.   Yes [provider]  FIBER SELECT GUMMIES PO Take 2 each by mouth 2 (two) times daily.    Yes [provider]  folic acid (FOLVITE) 1 MG tablet Take 1 tablet (1 mg total) by mouth daily. Start 5-7 days before Alimta chemotherapy. Continue until 21 days after Alimta completed. 02/27/17  Yes Ardath Sax, MD  hydroxychloroquine (PLAQUENIL) 200 MG tablet  Take 200 mg by mouth daily.    Yes [provider]  ipratropium (ATROVENT) 0.06 % nasal spray Place 2 sprays into both nostrils 2 (two) times daily as needed for rhinitis.   Yes [provider]  lidocaine-prilocaine (EMLA) cream Apply to affected area once 02/27/17  Yes Perlov, Marinell Blight, MD  LORazepam (ATIVAN) 0.5 MG tablet Take 1 tablet (0.5 mg total) by mouth every 6 (six) hours as needed (Nausea or vomiting). 02/27/17  Yes  Ardath Sax, MD  MAGNESIUM OXIDE PO Take 500 mg by mouth daily.    Yes [provider]  metFORMIN (GLUCOPHAGE-XR) 500 MG 24 hr tablet Take 500 mg by mouth 2 (two) times a week. No specific days   Yes [provider]  methylPREDNISolone (MEDROL) 8 MG tablet Take 8 mg by mouth daily.   Yes [provider]  morphine (MS CONTIN) 30 MG 12 hr tablet Take 1 tablet (30 mg total) by mouth every 12 (twelve) hours. 02/27/17 03/29/17 Yes Ardath Sax, MD  morphine 20 MG/5ML solution Take 1 mL (4 mg total) by mouth 2 (two) times daily as needed for pain. 03/14/17  Yes Higgs, Mathis Dad, MD  nebivolol (BYSTOLIC) 5 MG tablet Take 5 mg by mouth every evening.    Yes [provider]  omeprazole (PRILOSEC) 40 MG capsule TAKE (1) CAPSULE BY MOUTH ONCE DAILY. Patient taking differently: TAKE 40 MG BY MOUTH ONCE DAILY. 07/21/14  Yes Rehman, Mechele Dawley, MD  ondansetron (ZOFRAN) 8 MG tablet Take 1 tablet (8 mg total) by mouth 2 (two) times daily as needed for refractory nausea / vomiting. Start on day 3 after chemo. 02/27/17  Yes Perlov, Marinell Blight, MD  Polyethyl Glycol-Propyl Glycol (SYSTANE) 0.4-0.3 % GEL Apply 1 application to eye daily as needed (for dry eyes).    Yes [provider]  prochlorperazine (COMPAZINE) 10 MG tablet Take 1 tablet (10 mg total) by mouth every 6 (six) hours as needed (Nausea or vomiting). 02/27/17  Yes Perlov, Marinell Blight, MD  XARELTO 20 MG TABS tablet Take 1 tablet by mouth daily. 02/27/17  Yes [provider]    Physical Exam: Vitals:   03/15/17 1933 03/15/17 2236 03/15/17 2359 03/16/17 0220  BP:  (!) 170/63 (!) 180/71 (!) 160/98  Pulse:  70 70 60  Resp:  18 18 16   Temp:   98.1 F (36.7 C)   TempSrc:   Oral   SpO2:  98% 97% 95%  Weight: 75.3 kg (166 lb)     Height: 5\' 4"  (1.626 m)       Wt Readings from Last 3 Encounters:  03/15/17 75.3 kg (166 lb)  03/14/17 75.7 kg (166 lb 12.8 oz)  03/13/17 75.5 kg (166 lb 8 oz)    General:  Appears calm and comfortable; A&Ox3 Eyes:  PERRL, EOMI, normal lids, iris ENT:  grossly normal hearing, lips & tongue Neck:  no LAD, masses or thyromegaly Cardiovascular:  RRR, no m/r/g. No LE edema.  Respiratory:  CTA bilaterally, no w/r/r. Normal respiratory effort. Abdomen:  soft, ntnd Skin:  no rash or induration seen on limited exam Musculoskeletal:  grossly normal tone BUE/BLE Psychiatric:  grossly normal mood and affect, speech fluent and appropriate Neurologic:  CN 2-12 grossly intact, moves all extremities in coordinated fashion.          Labs on Admission:  Basic Metabolic Panel: Recent Labs  Lab 03/14/17 1044 03/15/17 2258  NA 140 141  K 3.4* 3.7  CL 104 105  CO2 25 28  GLUCOSE 224* 203*  BUN 18 16  CREATININE 0.68 0.58  CALCIUM 9.4 9.1   Liver Function Tests: Recent Labs  Lab 03/14/17 1044  AST 28  ALT 81*  ALKPHOS 259*  BILITOT 0.4  PROT 6.0*  ALBUMIN 3.0*   No results for input(s): LIPASE, AMYLASE in the last 168 hours. No results for input(s): AMMONIA in the last 168 hours. CBC: Recent Labs  Lab 03/14/17 1044 03/15/17 2258  WBC 18.9* 15.6*  NEUTROABS 14.6  --   HGB 8.4* 8.9*  HCT 31.0* 31.7*  MCV 79.5 77.5*  PLT 282 239   Cardiac Enzymes: No results for input(s): CKTOTAL, CKMB, CKMBINDEX, TROPONINI in the last 168 hours.  BNP (last 3 results) No results for input(s): BNP in the last 8760 hours.  ProBNP (last 3 results) No results for input(s): PROBNP in the last 8760 hours.   Serum creatinine: 0.58  mg/dL 03/15/17 2258 Estimated creatinine clearance: 66.8 mL/min  CBG: No results for input(s): GLUCAP in the last 168 hours.  Radiological Exams on Admission: No results found.  EKG: no new  Assessment/Plan Active Problems:   Pain  Pain Per EDP sent over with instructions for PCA to be started with morphine Dosing discussed with pharmacy and PCA ordered She takes 60 mg of MS Contin a day would classify her as tolerated NPO  Hypertension When necessary hydralazine 10 mg IV as needed for severe blood pressure Cont bystolic Gout Cont allopurinol  Hyperlipidemia Continue statin  Mood disorder No SI/HI Cont cymbalta  GERD PPI  Lung CA Cont Folvite Prn ativan N/V Daily medrol  Arthritis Cont plaquenil  Rhinitis Cont Atrovent nose spray   Dry eyes Cont systane  DM SSI q4 Hold oral meds  Code Status: FC  DVT Prophylaxis: SCDs, hold xarelto Family Communication: none available Disposition Plan: Pending Improvement  Status: medsurg inpt  Elwin Mocha, MD Family Medicine [facility] [email or web address] Password [email or web address]

## 2017-03-16 NOTE — Progress Notes (Signed)
Informed Miranda, RT on L3 that the patient was admitted for pain control. Jeannetta Nap is aware the patient is on [address] [place] in room 1329.

## 2017-03-16 NOTE — Progress Notes (Signed)
Patient ID: [name], female   DOB: 24-Jul-1950, 67 y.o.   MRN: [id number]  PROGRESS NOTE    OMARI KOSLOSKY  JIR:[id number] DOB: 10-Dec-1950 DOA: 03/15/2017  PCP: Jani Gravel, MD   Brief Narrative:   67 y.o. female with history of RA, brain cancer, breast cancer, diabetes who presented to ED with intractable back pain. She was sent by oncologist for pain control.   Assessment & Plan:   Cancer related pain - Continue pain management efforts - On PCA  Anemia of chronic disease - Hgb 8.8  Widely metastatic malignancy of unknown primary - Liver biopsy toady   RA - Continue Plaquenil and steroids   Depression - Continue Cymbalta     DVT prophylaxis: SCD's Code Status: full code  Family Communication: no family at the bedside  Disposition Plan: liver biopsy today    Consultants:   IR  Procedures:   None   Antimicrobials:   None     Subjective: Pain better this am.  Objective: Vitals:   03/16/17 0440 03/16/17 0513 03/16/17 0519 03/16/17 0823  BP: (!) 174/76     Pulse: 73     Resp: 20 16 16 20   Temp: 98.6 F (37 C)     TempSrc: Oral     SpO2: 100% 99% 99% 97%  Weight: 72.4 kg (159 lb 9.6 oz)     Height: 5' 7.5" (1.715 m)       Intake/Output Summary (Last 24 hours) at 03/16/2017 1118 Last data filed at 03/16/2017 0600 Gross per 24 hour  Intake 300 ml  Output -  Net 300 ml   Filed Weights   03/15/17 1933 03/16/17 0440  Weight: 75.3 kg (166 lb) 72.4 kg (159 lb 9.6 oz)    Examination:  General exam: Appears calm and comfortable  Respiratory system: Clear to auscultation. Respiratory effort normal. Cardiovascular system: S1 & S2 heard, RRR Gastrointestinal system: Abdomen is non tender, (+) BS Central nervous system: Alert and oriented. No focal neurological deficits. Extremities: Symmetric 5 x 5 power. Skin: No rashes, lesions or ulcers Psychiatry: Judgement and insight appear normal. Mood & affect appropriate.   Data Reviewed: I have  personally reviewed following labs and imaging studies  CBC: Recent Labs  Lab 03/14/17 1044 03/15/17 2258 03/16/17 0431  WBC 18.9* 15.6* 14.4*  NEUTROABS 14.6  --   --   HGB 8.4* 8.9* 8.8*  HCT 31.0* 31.7* 31.3*  MCV 79.5 77.5* 77.9*  PLT 282 239 751   Basic Metabolic Panel: Recent Labs  Lab 03/14/17 1044 03/15/17 2258 03/16/17 0416  NA 140 141 141  K 3.4* 3.7 3.8  CL 104 105 105  CO2 25 28 27   GLUCOSE 224* 203* 145*  BUN 18 16 16   CREATININE 0.68 0.58 0.60  CALCIUM 9.4 9.1 8.8*   GFR: Estimated Creatinine Clearance: 66.7 mL/min (by C-G formula based on SCr of 0.6 mg/dL). Liver Function Tests: Recent Labs  Lab 03/14/17 1044 03/16/17 0416  AST 28 23  ALT 81* 58*  ALKPHOS 259* 271*  BILITOT 0.4 0.7  PROT 6.0* 6.2*  ALBUMIN 3.0* 3.1*   No results for input(s): LIPASE, AMYLASE in the last 168 hours. No results for input(s): AMMONIA in the last 168 hours. Coagulation Profile: Recent Labs  Lab 03/16/17 0416  INR 1.47   Cardiac Enzymes: No results for input(s): CKTOTAL, CKMB, CKMBINDEX, TROPONINI in the last 168 hours. BNP (last 3 results) No results for input(s): PROBNP in the last 8760 hours. HbA1C:  No results for input(s): HGBA1C in the last 72 hours. CBG: Recent Labs  Lab 03/16/17 0439 03/16/17 0745  GLUCAP 141* 101*   Lipid Profile: No results for input(s): CHOL, HDL, LDLCALC, TRIG, CHOLHDL, LDLDIRECT in the last 72 hours. Thyroid Function Tests: No results for input(s): TSH, T4TOTAL, FREET4, T3FREE, THYROIDAB in the last 72 hours. Anemia Panel: No results for input(s): VITAMINB12, FOLATE, FERRITIN, TIBC, IRON, RETICCTPCT in the last 72 hours. Urine analysis:    Component Value Date/Time   COLORURINE YELLOW 03/15/2017 2231   APPEARANCEUR CLEAR 03/15/2017 2231   LABSPEC 1.010 03/15/2017 2231   PHURINE 7.0 03/15/2017 2231   GLUCOSEU NEGATIVE 03/15/2017 2231   HGBUR NEGATIVE 03/15/2017 2231   BILIRUBINUR NEGATIVE 03/15/2017 2231   KETONESUR  NEGATIVE 03/15/2017 2231   PROTEINUR NEGATIVE 03/15/2017 2231   UROBILINOGEN 0.2 02/25/2013 0913   NITRITE NEGATIVE 03/15/2017 2231   LEUKOCYTESUR TRACE (A) 03/15/2017 2231   Sepsis Labs: @LABRCNTIP (procalcitonin:4,lacticidven:4)   )No results found for this or any previous visit (from the past 240 hour(s)).    Radiology Studies: No results found.    Scheduled Meds: . allopurinol  300 mg Oral Daily  . atorvastatin  20 mg Oral QHS  . DULoxetine  60 mg Oral Daily  . folic acid  1 mg Oral Daily  . hydroxychloroquine  200 mg Oral Daily  . insulin aspart  0-15 Units Subcutaneous Q4H  . ipratropium  2 spray Each Nare TID  . lidocaine      . magnesium oxide  400 mg Oral Daily  . methylPREDNISolone  8 mg Oral Daily  . morphine   Intravenous Q4H  . nebivolol  5 mg Oral QPM  . pantoprazole  40 mg Oral Daily   Continuous Infusions: . sodium chloride 100 mL/hr at 03/16/17 0511  . sodium chloride 10 mL/hr (03/16/17 0512)     LOS: 0 days    Time spent: 25 minutes  Greater than 50% of the time spent on counseling and coordinating the care.   Leisa Lenz, MD [facility] Pager [phone]  If 7PM-7AM, please contact night-coverage [email or web address] Password [email or web address] 03/16/2017, 11:18 AM

## 2017-03-16 NOTE — Telephone Encounter (Signed)
Spoke with [facility] to verify Dr Mathis Dad Higgs was the ordering physician since she was not in their system.

## 2017-03-16 NOTE — ED Notes (Signed)
ED TO INPATIENT HANDOFF REPORT  Name/Age/Gender Unk [name] 67 y.o. female  Code Status    Code Status Orders  (From admission, onward)        Start     Ordered   03/16/17 0239  Full code  Continuous     03/16/17 0240    Code Status History    Date Active Date Inactive Code Status Order ID Comments User Context   02/25/2013 12:06 02/27/2013 14:57 Full Code [id number]  Linda Hedges PA-C Inpatient      Home/SNF/Other Home  Chief Complaint ADMITTED   Level of Care/Admitting Diagnosis ED Disposition    ED Disposition Condition [place]: [facility] [142395]  Level of Care: Med-Surg [16]  Diagnosis: Pain [320233]  Admitting Physician: Elwin Mocha [[id number]]  Attending Physician: Aggie Moats, Layne Benton [[id number]]  PT Class (Do Not Modify): Observation [104]  PT Acc Code (Do Not Modify): Observation [10022]       Medical History Past Medical History:  Diagnosis Date  . Anemia   . Angina   . Arthritis    Rheumatoid  . Brain cancer ([place])   . Breast cancer ([place])   . Cancer ([place])    left breast, cervical cancer  . Colon polyps   . Constipation   . Diabetes mellitus without complication ([place])   . Diarrhea   . Dysrhythmia   . GERD (gastroesophageal reflux disease)   . Headache   . Heart murmur   . History of kidney stones   . Hypercholesterolemia   . Hypertension   . Lung cancer ([place])   . Numbness and tingling in right hand   . Numbness and tingling of both feet   . Peripheral vascular disease ([place])    cerebrel aneurysm /sp coil  . Pneumonia   . PONV (postoperative nausea and vomiting)   . Raynaud's syndrome   . Rheumatoid arthritis with rheumatoid factor (HCC)   . Right knee DJD   . Stroke [facility])    jan 2012  . VTE (venous thromboembolism)     Allergies Allergies  Allergen Reactions  . Aurothioglucose [Solganal] Other (See Comments)    REACTION: VASCULITIS   . Feldene [Piroxicam] Anaphylaxis  and Other (See Comments)    SKIN BLISTERS  . Hydralazine Other (See Comments)    REACTION: Chest pains and headaches: IV only  . Imdur [Isosorbide Mononitrate] Other (See Comments)    REACTION: PATIENT IS UNABLE TO WALK OR FUNCTION  . Imuran [Azathioprine] Other (See Comments)    Could not walk or function  . Methotrexate Derivatives Other (See Comments)    REACTION: VASCULITIS  . Calcium Channel Blockers Palpitations  . Sulfa Antibiotics Diarrhea and Nausea And Vomiting  . Thimerosal Other (See Comments)    SKIN BLISTERED  . Vioxx [Rofecoxib] Other (See Comments)    REACTION: CAUSED MORE JOINT PAIN  . Arava [Leflunomide]     UNSPECIFIED REACTION   . Asacol [Mesalamine]     UNSPECIFIED REACTION   . Iohexol Other (See Comments)    PT STATES SHE WAS TOLD NEVER TO HAVE CT CONTRAST AGAIN.  SHE HAD SOME KIND OF REACTION BUT DOESNT REMEBER WHAT HAPPENED   . Hydrocodone Itching  . Latex Rash  . Neomycin Rash  . Nickel Rash  . Nsaids Other (See Comments)    GI UPSET , CAN TOLERATE SOME NSAIDS  . Percocet [Oxycodone-Acetaminophen] Other (See Comments)    INSOMNIA    IV  Location/Drains/Wounds Patient Lines/Drains/Airways Status   Active Line/Drains/Airways    Name:   Placement date:   Placement time:   Site:   Days:   Implanted Port 03/08/17 Right Chest   03/08/17    1522    Chest   8   Incision 02/25/13 Leg Right   02/25/13    9528     1480          Labs/Imaging Results for orders placed or performed during the hospital encounter of 03/15/17 (from the past 48 hour(s))  Urinalysis, Routine w reflex microscopic- may I&O cath if menses     Status: Abnormal   Collection Time: 03/15/17 10:31 PM  Result Value Ref Range   Color, Urine YELLOW YELLOW   APPearance CLEAR CLEAR   Specific Gravity, Urine 1.010 1.005 - 1.030   pH 7.0 5.0 - 8.0   Glucose, UA NEGATIVE NEGATIVE mg/dL   Hgb urine dipstick NEGATIVE NEGATIVE   Bilirubin Urine NEGATIVE NEGATIVE   Ketones, ur NEGATIVE  NEGATIVE mg/dL   Protein, ur NEGATIVE NEGATIVE mg/dL   Nitrite NEGATIVE NEGATIVE   Leukocytes, UA TRACE (A) NEGATIVE   RBC / HPF 0-5 0 - 5 RBC/hpf   WBC, UA 0-5 0 - 5 WBC/hpf   Bacteria, UA RARE (A) NONE SEEN   Squamous Epithelial / LPF 0-5 (A) NONE SEEN  CBC     Status: Abnormal   Collection Time: 03/15/17 10:58 PM  Result Value Ref Range   WBC 15.6 (H) 4.0 - 10.5 K/uL   RBC 4.09 3.87 - 5.11 MIL/uL   Hemoglobin 8.9 (L) 12.0 - 15.0 g/dL   HCT 31.7 (L) 36.0 - 46.0 %   MCV 77.5 (L) 78.0 - 100.0 fL   MCH 21.8 (L) 26.0 - 34.0 pg   MCHC 28.1 (L) 30.0 - 36.0 g/dL   RDW 18.7 (H) 11.5 - 15.5 %   Platelets 239 150 - 400 K/uL  Basic metabolic panel     Status: Abnormal   Collection Time: 03/15/17 10:58 PM  Result Value Ref Range   Sodium 141 135 - 145 mmol/L   Potassium 3.7 3.5 - 5.1 mmol/L   Chloride 105 101 - 111 mmol/L   CO2 28 22 - 32 mmol/L   Glucose, Bld 203 (H) 65 - 99 mg/dL   BUN 16 6 - 20 mg/dL   Creatinine, Ser 0.58 0.44 - 1.00 mg/dL   Calcium 9.1 8.9 - 10.3 mg/dL   GFR calc non Af Amer >60 >60 mL/min   GFR calc Af Amer >60 >60 mL/min    Comment: (NOTE) The eGFR has been calculated using the CKD EPI equation. This calculation has not been validated in all clinical situations. eGFR's persistently <60 mL/min signify possible Chronic Kidney Disease.    Anion gap 8 5 - 15   No results found.  Pending Labs Unresulted Labs (From admission, onward)   Start     Ordered   03/16/17 0500  Comprehensive metabolic panel  Tomorrow morning,   R     03/16/17 0240   03/16/17 0500  APTT  Tomorrow morning,   R     03/16/17 0240   03/16/17 0500  Protime-INR  Tomorrow morning,   R     03/16/17 0240      Vitals/Pain Today's Vitals   03/15/17 2358 03/15/17 2359 03/16/17 0220 03/16/17 0255  BP:  (!) 180/71 (!) 160/98 (!) 153/74  Pulse:  70 60 64  Resp:  '18 16 18  '$ Temp:  98.1 F (36.7 C)    TempSrc:  Oral    SpO2:  97% 95% 100%  Weight:      Height:      PainSc: 3         Isolation Precautions No active isolations  Medications Medications  naloxone (NARCAN) injection 0.4 mg (not administered)    And  sodium chloride flush (NS) 0.9 % injection 9 mL (not administered)  ondansetron (ZOFRAN) injection 4 mg (not administered)  diphenhydrAMINE (BENADRYL) injection 12.5 mg (not administered)    Or  diphenhydrAMINE (BENADRYL) 12.5 MG/5ML elixir 12.5 mg (not administered)  morphine 2 mg/mL PCA injection (not administered)  allopurinol (ZYLOPRIM) tablet 300 mg (not administered)  atorvastatin (LIPITOR) tablet 20 mg (not administered)  DULoxetine (CYMBALTA) DR capsule 60 mg (not administered)  folic acid (FOLVITE) tablet 1 mg (not administered)  hydroxychloroquine (PLAQUENIL) tablet 200 mg (not administered)  ipratropium (ATROVENT) 0.06 % nasal spray 2 spray (not administered)  LORazepam (ATIVAN) tablet 0.5 mg (not administered)  magnesium oxide (MAG-OX) tablet 400 mg (not administered)  methylPREDNISolone (MEDROL) tablet 8 mg (not administered)  nebivolol (BYSTOLIC) tablet 5 mg (not administered)  pantoprazole (PROTONIX) EC tablet 40 mg (not administered)  polyethylene glycol 0.4% and propylene glycol 0.3% (SYSTANE) ophthalmic gel (not administered)  0.9 %  sodium chloride infusion (not administered)  acetaminophen (TYLENOL) tablet 650 mg (not administered)    Or  acetaminophen (TYLENOL) suppository 650 mg (not administered)  ondansetron (ZOFRAN) tablet 4 mg (not administered)    Or  ondansetron (ZOFRAN) injection 4 mg (not administered)  insulin aspart (novoLOG) injection 0-15 Units (not administered)  HYDROmorphone (DILAUDID) injection 1 mg (1 mg Intravenous Given 03/15/17 2307)  ondansetron (ZOFRAN) injection 4 mg (4 mg Intravenous Given 03/15/17 2307)    Mobility walks with person assist

## 2017-03-16 NOTE — Progress Notes (Signed)
Referring Physician(s): Higgs,V  Supervising Physician: Daryll Brod  Patient Status:  [facility] - In-pt  Chief Complaint:  Back pain, metastatic lung cancer  Subjective: Patient familiar to IR service from prior cerebral arteriogram in 2012.  She has a history of DCIS of the breast status post lumpectomy 2004. She also has hx LLE DVT on xarelto. She is also status post right chest wall Port-A-Cath placement as well as right sclerotic bone lesion biopsy and bone marrow biopsy earlier this month.  Pathology revealed metastatic non-small cell lung cancer.  She was originally scheduled for outpatient liver lesion biopsy at [facility] today, however was admitted yesterday with uncontrollable back pain.  She is currently on PCA pump with improved symptoms.  Oncology has asked that we try and perform liver biopsy today.  She currently denies fever, headache, chest pain, worsening dyspnea, cough, abdominal pain, nausea, vomiting or bleeding. Past Medical History:  Diagnosis Date  . Anemia   . Angina   . Arthritis    Rheumatoid  . Brain cancer ([place])   . Breast cancer ([place])   . Cancer ([place])    left breast, cervical cancer  . Colon polyps   . Constipation   . Diabetes mellitus without complication ([place])   . Diarrhea   . Dysrhythmia   . GERD (gastroesophageal reflux disease)   . Headache   . Heart murmur   . History of kidney James   . Hypercholesterolemia   . Hypertension   . Lung cancer ([place])   . Numbness and tingling in right hand   . Numbness and tingling of both feet   . Peripheral vascular disease ([place])    cerebrel aneurysm /sp coil  . Pneumonia   . PONV (postoperative nausea and vomiting)   . Raynaud's syndrome   . Rheumatoid arthritis with rheumatoid factor (HCC)   . Right knee DJD   . Stroke [facility])    jan 2012  . VTE (venous thromboembolism)    Past Surgical History:  Procedure Laterality Date  . ABDOMINAL HYSTERECTOMY  1977  . APPENDECTOMY  1964  . BILATERAL  OOPHORECTOMY    . CHOLECYSTECTOMY    . COLONOSCOPY  12/09/2010   Procedure: COLONOSCOPY;  Surgeon: Rogene Houston, MD;  [facility];  Service: Endoscopy;  Laterality: N/A;  . COLONOSCOPY N/A 06/26/2015   Procedure: COLONOSCOPY;  Surgeon: Rogene Houston, MD;  Location[facility];  Service: Endoscopy;  Laterality: N/A;  1:05-moved to [address] to notify pt  . ESOPHAGOGASTRODUODENOSCOPY N/A 06/26/2015   Procedure: ESOPHAGOGASTRODUODENOSCOPY (EGD);  Surgeon: Rogene Houston, MD;  Location[facility];  Service: Endoscopy;  Laterality: N/A;  . EYE SURGERY     cataract surgery bilat   . GIVENS CAPSULE STUDY N/A 08/20/2015   Procedure: GIVENS CAPSULE STUDY;  Surgeon: Rogene Houston, MD;  Location: [facility];  Service: Endoscopy;  Laterality: N/A;  730  . IR FLUORO GUIDE PORT INSERTION RIGHT  03/08/2017  . IR US GUIDE VASC ACCESS RIGHT  03/08/2017  . left breast lumpectomy  2004  . left knee arthroscopy    . Right brain aneurysm  04/2010   Coiled   . right wrist surgery  1999   synevectomy  . TOTAL KNEE ARTHROPLASTY Right 02/25/2013   Procedure: TOTAL KNEE ARTHROPLASTY;  Surgeon: Lorn Junes, MD;  Location: [place];  Service: Orthopedics;  Laterality: Right;       Allergies: Aurothioglucose [solganal]; Feldene [piroxicam]; Hydralazine; Imdur [isosorbide  mononitrate]; Imuran [azathioprine]; Methotrexate derivatives; Calcium channel blockers; Sulfa antibiotics; Thimerosal; Vioxx [rofecoxib]; Arava [leflunomide]; Asacol [mesalamine]; Iohexol; Hydrocodone; Latex; Neomycin; Nickel; Nsaids; and Percocet [oxycodone-acetaminophen]  Medications: Prior to Admission medications   Medication Sig Start Date End Date Taking? Authorizing Provider  allopurinol (ZYLOPRIM) 300 MG tablet Take 300 mg by mouth daily.    Yes [provider]  atorvastatin (LIPITOR) 20 MG tablet Take 20 mg by mouth at bedtime.   Yes [provider]  Calcium Carbonate-Vit D-Min (CALTRATE 600+D  PLUS) 600-400 MG-UNIT per tablet Chew 1 tablet by mouth 2 (two) times daily.     Yes [provider]  Cholecalciferol (VITAMIN D) 2000 UNITS tablet Take 2,000 Units by mouth daily.    Yes [provider]  dexamethasone (DECADRON) 4 MG tablet Take 1 tab two times a day the day before Alimta chemo. Take 2 tabs two times a day starting the day after chemo for 3 days. 02/27/17  Yes Ardath Sax, MD  DULoxetine (CYMBALTA) 60 MG capsule Take 60 mg by mouth daily.   Yes [provider]  FIBER SELECT GUMMIES PO Take 2 each by mouth 2 (two) times daily.    Yes [provider]  folic acid (FOLVITE) 1 MG tablet Take 1 tablet (1 mg total) by mouth daily. Start 5-7 days before Alimta chemotherapy. Continue until 21 days after Alimta completed. 02/27/17  Yes Ardath Sax, MD  hydroxychloroquine (PLAQUENIL) 200 MG tablet Take 200 mg by mouth daily.    Yes [provider]  ipratropium (ATROVENT) 0.06 % nasal spray Place 2 sprays into both nostrils 2 (two) times daily as needed for rhinitis.   Yes [provider]  lidocaine-prilocaine (EMLA) cream Apply to affected area once 02/27/17  Yes Perlov, Marinell Blight, MD  LORazepam (ATIVAN) 0.5 MG tablet Take 1 tablet (0.5 mg total) by mouth every 6 (six) hours as needed (Nausea or vomiting). 02/27/17  Yes Ardath Sax, MD  MAGNESIUM OXIDE PO Take 500 mg by mouth daily.    Yes [provider]  metFORMIN (GLUCOPHAGE-XR) 500 MG 24 hr tablet Take 500 mg by mouth 2 (two) times a week. No specific days   Yes [provider]  methylPREDNISolone (MEDROL) 8 MG tablet Take 8 mg by mouth daily.   Yes [provider]  morphine (MS CONTIN) 30 MG 12 hr tablet Take 1 tablet (30 mg total) by mouth every 12 (twelve) hours. 02/27/17 03/29/17 Yes Ardath Sax, MD  morphine 20 MG/5ML solution Take 1 mL (4 mg total) by mouth 2 (two) times daily as needed for pain. 03/14/17  Yes Higgs, Mathis Dad, MD  nebivolol  (BYSTOLIC) 5 MG tablet Take 5 mg by mouth every evening.    Yes [provider]  omeprazole (PRILOSEC) 40 MG capsule TAKE (1) CAPSULE BY MOUTH ONCE DAILY. Patient taking differently: TAKE 40 MG BY MOUTH ONCE DAILY. 07/21/14  Yes Rehman, Mechele Dawley, MD  ondansetron (ZOFRAN) 8 MG tablet Take 1 tablet (8 mg total) by mouth 2 (two) times daily as needed for refractory nausea / vomiting. Start on day 3 after chemo. 02/27/17  Yes Perlov, Marinell Blight, MD  Polyethyl Glycol-Propyl Glycol (SYSTANE) 0.4-0.3 % GEL Apply 1 application to eye daily as needed (for dry eyes).    Yes [provider]  prochlorperazine (COMPAZINE) 10 MG tablet Take 1 tablet (10 mg total) by mouth every 6 (six) hours as needed (Nausea or vomiting). 02/27/17  Yes Perlov, Marinell Blight, MD  XARELTO 20 MG TABS tablet Take 1 tablet by mouth daily. 02/27/17  Yes [provider]     Vital Signs: BP (!) 174/76 (BP Location: Right Arm)   Pulse 73   Temp 98.6 F (37 C) (Oral)   Resp 20   Ht 5' 7.5" (1.715 m)   Wt 159 lb 9.6 oz (72.4 kg)   SpO2 97%   BMI 24.63 kg/m   Physical Exam awake, alert.  Chest with clear breath sounds bilaterally.  Clean, intact right chest wall Port-A-Cath.  Heart with regular rate and rhythm.  Abdomen soft, positive bowel sounds, nontender.  Trace lower extremity edema bilat.  Imaging: No results found.  Labs:  CBC: Recent Labs    03/08/17 1236 03/14/17 1044 03/15/17 2258 03/16/17 0431  WBC 17.7* 18.9* 15.6* 14.4*  HGB 10.1* 8.4* 8.9* 8.8*  HCT 36.1 31.0* 31.7* 31.3*  PLT 366 282 239 253    COAGS: Recent Labs    02/21/17 0721 03/16/17 0416  INR 0.99 1.47  APTT 26 31    BMP: Recent Labs    03/08/17 1236 03/14/17 1044 03/15/17 2258 03/16/17 0416  NA 141 140 141 141  K 3.3* 3.4* 3.7 3.8  CL 104 104 105 105  CO2 '26 25 28 27  '$ GLUCOSE 99 224* 203* 145*  BUN '15 18 16 16  '$ CALCIUM 9.5 9.4 9.1 8.8*  CREATININE 0.63 0.68 0.58 0.60  GFRNONAA >60 >60 >60 >60  GFRAA >60  >60 >60 >60    LIVER FUNCTION TESTS: Recent Labs    03/14/17 1044 03/16/17 0416  BILITOT 0.4 0.7  AST 28 23  ALT 81* 58*  ALKPHOS 259* 271*  PROT 6.0* 6.2*  ALBUMIN 3.0* 3.1*    Assessment and Plan: Patient with history of stage IV metastatic non-small cell lung cancer diagnosed via sclerotic bone lesion biopsy on 02/21/17.  Also with previously treated breast cancer.  PET scan reveals hypermetabolic left upper lobe and left lower lobe lesions with hilar adenopathy, liver lesions and multiple skeletal metastases.  Request now received from oncology for image guided liver lesion biopsy for lung biomarkers.  Imaging studies were reviewed by Dr. Annamaria Boots. Risks and benefits discussed with the patient/family including, but not limited to bleeding, infection, damage to adjacent structures or low yield requiring additional tests.All of the patient's questions were answered, patient is agreeable to proceed. Consent signed and in chart.  Last dose of Lovenox and Xarelto over 24 hours ago.Procedure tent scheduled for today.    Electronically Signed: D. Rowe Robert, PA-C 03/16/2017, 10:45 AM   I spent a total of 25 minutes at the the patient's bedside AND on the patient's hospital floor or unit, greater than 50% of which was counseling/coordinating care for image guided liver lesion biopsy    Patient ID: [name], female   DOB: September 20, 1950, 67 y.o.   MRN: [id number]

## 2017-03-16 NOTE — Progress Notes (Signed)
Pt returned from radiology and ultrasound. Liver biopsy was completed. Pt back in room. Site looks good. No bleeding. Pt is complaining of pain to the right side. Encouraged patient to push her morphine pca button.

## 2017-03-17 ENCOUNTER — Ambulatory Visit
Admission: RE | Admit: 2017-03-17 | Discharge: 2017-03-17 | Disposition: A | Discharge status: Home / Self Care | Payer: Medicare Other | Source: Ambulatory Visit | Attending: Radiation Oncology | Admitting: Radiation Oncology

## 2017-03-17 DIAGNOSIS — C3412 Malignant neoplasm of upper lobe, left bronchus or lung: Secondary | ICD-10-CM | POA: Diagnosis not present

## 2017-03-17 DIAGNOSIS — C7951 Secondary malignant neoplasm of bone: Secondary | ICD-10-CM | POA: Diagnosis not present

## 2017-03-17 LAB — GLUCOSE, CAPILLARY
Glucose-Capillary: 101 mg/dL — ABNORMAL HIGH (ref 65–99)
Glucose-Capillary: 135 mg/dL — ABNORMAL HIGH (ref 65–99)
Glucose-Capillary: 145 mg/dL — ABNORMAL HIGH (ref 65–99)
Glucose-Capillary: 146 mg/dL — ABNORMAL HIGH (ref 65–99)
Glucose-Capillary: 162 mg/dL — ABNORMAL HIGH (ref 65–99)
Glucose-Capillary: 93 mg/dL (ref 65–99)

## 2017-03-17 LAB — BASIC METABOLIC PANEL
Anion gap: 7 (ref 5–15)
BUN: 18 mg/dL (ref 6–20)
CO2: 27 mmol/L (ref 22–32)
CREATININE: 0.62 mg/dL (ref 0.44–1.00)
Calcium: 8.7 mg/dL — ABNORMAL LOW (ref 8.9–10.3)
Chloride: 109 mmol/L (ref 101–111)
GFR calc non Af Amer: >60 mL/min (ref >60)
Glucose, Bld: 98 mg/dL (ref 65–99)
Potassium: 3.7 mmol/L (ref 3.5–5.1)
SODIUM: 143 mmol/L (ref 135–145)

## 2017-03-17 LAB — CBC
HCT: 28.3 % — ABNORMAL LOW (ref 36.0–46.0)
Hemoglobin: 8.1 g/dL — ABNORMAL LOW (ref 12.0–15.0)
MCH: 22.3 pg — ABNORMAL LOW (ref 26.0–34.0)
MCHC: 28.6 g/dL — AB (ref 30.0–36.0)
MCV: 78 fL (ref 78.0–100.0)
PLATELETS: 220 10*3/uL (ref 150–400)
RBC: 3.63 MIL/uL — ABNORMAL LOW (ref 3.87–5.11)
RDW: 18.9 % — AB (ref 11.5–15.5)
WBC: 14 10*3/uL — ABNORMAL HIGH (ref 4.0–10.5)

## 2017-03-17 MED ORDER — MORPHINE SULFATE ER 30 MG PO TBCR
60.0000 mg | EXTENDED_RELEASE_TABLET | Freq: Three times a day (TID) | ORAL | Status: DC
  Administered 2017-03-17 – 2017-03-18 (×3): 60 mg via ORAL
  Filled 2017-03-17 (×3): qty 2

## 2017-03-17 MED ORDER — MORPHINE SULFATE (CONCENTRATE) 10 MG/0.5ML PO SOLN
15.0000 mg | ORAL | Status: DC | PRN
  Administered 2017-03-17 (×2): 15 mg via ORAL
  Filled 2017-03-17 (×3): qty 1

## 2017-03-17 MED ORDER — RIVAROXABAN 20 MG PO TABS
20.0000 mg | ORAL_TABLET | Freq: Every day | ORAL | Status: DC
  Administered 2017-03-17: 20 mg via ORAL
  Filled 2017-03-17: qty 1

## 2017-03-17 NOTE — Progress Notes (Signed)
Patient ID: [name], female   DOB: May 31, 1950, 67 y.o.   MRN: [id number]  PROGRESS NOTE    [name]  CBJ:[id number] DOB: [birth year] DOA: 03/15/2017  PCP: Jani Gravel, MD   Brief Narrative:   67 y.o. female with history of RA, brain cancer, breast cancer, diabetes who presented to ED with intractable back pain. She was sent by oncologist for pain control.   Assessment & Plan:   Cancer related pain - Continue pain management efforts - Stop PCA and use MS contin, now changed to TID regimen per pt oncologist - Added morphine solution every 1 hour PRN 15 mg for better pain control   Anemia of chronic disease - Hgb 8.8  Widely metastatic malignancy of unknown primary - Had liver biopsy 1/31  RA - Continue Plaquenil and steroids   Depression - Continue Cymbalta     DVT prophylaxis: SCD's Code Status: full code  Family Communication: no family at bedside Disposition Plan: home once pain managed    Consultants:   IR  Procedures:   None   Antimicrobials:   None     Subjective: Pain controlled little better this am.  Objective: Vitals:   03/17/17 0005 03/17/17 0348 03/17/17 0351 03/17/17 0949  BP:   139/69   Pulse:   65   Resp: 18 17 20 16   Temp:   98.3 F (36.8 C)   TempSrc:   Oral   SpO2: 98% 99% 98% 98%  Weight:   74.2 kg (163 lb 9.6 oz)   Height:        Intake/Output Summary (Last 24 hours) at 03/17/2017 1003 Last data filed at 03/16/2017 1600 Gross per 24 hour  Intake 814.67 ml  Output -  Net 814.67 ml   Filed Weights   03/15/17 1933 03/16/17 0440 03/17/17 0351  Weight: 75.3 kg (166 lb) 72.4 kg (159 lb 9.6 oz) 74.2 kg (163 lb 9.6 oz)    Physical Exam  Constitutional: Appears well-developed and well-nourished. No distress.  CVS: RRR, S1/S2 + Pulmonary: Effort and breath sounds normal, no stridor, rhonchi, wheezes, rales.  Abdominal: Soft. BS +,  no distension, tenderness, rebound or guarding.  Musculoskeletal: No edema and no  tenderness.  Lymphadenopathy: No lymphadenopathy noted, cervical, inguinal. Neuro: Alert. Normal reflexes, muscle tone coordination. No cranial nerve deficit. Skin: Skin is warm and dry.  Psychiatric: Normal mood and affect. Behavior, judgment, thought content normal.     Data Reviewed: I have personally reviewed following labs and imaging studies  CBC: Recent Labs  Lab 03/14/17 1044 03/15/17 2258 03/16/17 0431 03/17/17 0440  WBC 18.9* 15.6* 14.4* 14.0*  NEUTROABS 14.6  --   --   --   HGB 8.4* 8.9* 8.8* 8.1*  HCT 31.0* 31.7* 31.3* 28.3*  MCV 79.5 77.5* 77.9* 78.0  PLT 282 239 253 160   Basic Metabolic Panel: Recent Labs  Lab 03/14/17 1044 03/15/17 2258 03/16/17 0416 03/17/17 0440  NA 140 141 141 143  K 3.4* 3.7 3.8 3.7  CL 104 105 105 109  CO2 25 28 27 27   GLUCOSE 224* 203* 145* 98  BUN 18 16 16 18   CREATININE 0.68 0.58 0.60 0.62  CALCIUM 9.4 9.1 8.8* 8.7*   GFR: Estimated Creatinine Clearance: 66.7 mL/min (by C-G formula based on SCr of 0.62 mg/dL). Liver Function Tests: Recent Labs  Lab 03/14/17 1044 03/16/17 0416  AST 28 23  ALT 81* 58*  ALKPHOS 259* 271*  BILITOT 0.4 0.7  PROT 6.0* 6.2*  ALBUMIN 3.0* 3.1*   No results for input(s): LIPASE, AMYLASE in the last 168 hours. No results for input(s): AMMONIA in the last 168 hours. Coagulation Profile: Recent Labs  Lab 03/16/17 0416  INR 1.47   Cardiac Enzymes: No results for input(s): CKTOTAL, CKMB, CKMBINDEX, TROPONINI in the last 168 hours. BNP (last 3 results) No results for input(s): PROBNP in the last 8760 hours. HbA1C: No results for input(s): HGBA1C in the last 72 hours. CBG: Recent Labs  Lab 03/16/17 1708 03/16/17 2019 03/17/17 0004 03/17/17 0355 03/17/17 0736  GLUCAP 88 167* 110* 101* 93   Lipid Profile: No results for input(s): CHOL, HDL, LDLCALC, TRIG, CHOLHDL, LDLDIRECT in the last 72 hours. Thyroid Function Tests: No results for input(s): TSH, T4TOTAL, FREET4, T3FREE,  THYROIDAB in the last 72 hours. Anemia Panel: No results for input(s): VITAMINB12, FOLATE, FERRITIN, TIBC, IRON, RETICCTPCT in the last 72 hours. Urine analysis:    Component Value Date/Time   COLORURINE YELLOW 03/15/2017 2231   APPEARANCEUR CLEAR 03/15/2017 2231   LABSPEC 1.010 03/15/2017 2231   PHURINE 7.0 03/15/2017 2231   GLUCOSEU NEGATIVE 03/15/2017 2231   HGBUR NEGATIVE 03/15/2017 2231   BILIRUBINUR NEGATIVE 03/15/2017 2231   KETONESUR NEGATIVE 03/15/2017 2231   PROTEINUR NEGATIVE 03/15/2017 2231   UROBILINOGEN 0.2 02/25/2013 0913   NITRITE NEGATIVE 03/15/2017 2231   LEUKOCYTESUR TRACE (A) 03/15/2017 2231   Sepsis Labs: @LABRCNTIP (procalcitonin:4,lacticidven:4)   )No results found for this or any previous visit (from the past 240 hour(s)).    Radiology Studies: US Biopsy (liver)  Result Date: 03/16/2017 INDICATION: Metastatic lung cancer, hepatic mass EXAM: ULTRASOUND GUIDED CORE BIOPSY OF RIGHT HEPATIC MASS MEDICATIONS: 1% LIDOCAINE LOCAL ANESTHESIA/SEDATION: Versed 2.0mg  IV; Fentanyl 180mcg IV; Moderate Sedation Time:  11 MINUTES The patient was continuously monitored during the procedure by the interventional radiology nurse under my direct supervision. FLUOROSCOPY TIME:  Fluoroscopy Time: NONE. COMPLICATIONS: None immediate. PROCEDURE: The procedure, risks, benefits, and alternatives were explained to the patient. Questions regarding the procedure were encouraged and answered. The patient understands and consents to the procedure. Previous imaging reviewed. Patient positioned right anterior oblique. Preliminary ultrasound performed. The 3.6 cm hypoechoic solid lesion in the right hepatic dome was localized. Overlying skin marked. Under sterile conditions and local anesthesia, a 17 gauge 11.8 cm access needle was advanced percutaneously into the lesion. Needle position confirmed with ultrasound. Images obtained for documentation. 3 18 gauge core biopsies obtained. Samples placed  in formalin. Needle tract occluded with Gel-Foam. Postprocedure imaging demonstrates no hemorrhage or hematoma. Patient tolerated the biopsy well. FINDINGS: Imaging confirms needle placed into the right hepatic dome lesion for core biopsy IMPRESSION: Successful ultrasound right hepatic dome mass 18 gauge core biopsy Electronically Signed   By: Jerilynn Mages.  Shick M.D.   On: 03/16/2017 13:03      Scheduled Meds: . allopurinol  300 mg Oral Daily  . atorvastatin  20 mg Oral QHS  . DULoxetine  60 mg Oral Daily  . folic acid  1 mg Oral Daily  . hydroxychloroquine  200 mg Oral Daily  . insulin aspart  0-15 Units Subcutaneous Q4H  . ipratropium  2 spray Each Nare TID  . magnesium oxide  400 mg Oral Daily  . methylPREDNISolone  8 mg Oral Daily  . morphine  60 mg Oral TID  . nebivolol  5 mg Oral QPM  . pantoprazole  40 mg Oral Daily   Continuous Infusions: . sodium chloride 10 mL/hr at 03/17/17 0650     LOS:  1 day    Time spent: 25 minutes  Greater than 50% of the time spent on counseling and coordinating the care.   Leisa Lenz, MD [facility] Pager [phone]  If 7PM-7AM, please contact night-coverage [email or web address] Password [email or web address] 03/17/2017, 10:03 AM

## 2017-03-18 LAB — BASIC METABOLIC PANEL
Anion gap: 7 (ref 5–15)
BUN: 18 mg/dL (ref 6–20)
CHLORIDE: 107 mmol/L (ref 101–111)
CO2: 29 mmol/L (ref 22–32)
Calcium: 9.4 mg/dL (ref 8.9–10.3)
Creatinine, Ser: 0.58 mg/dL (ref 0.44–1.00)
GFR calc Af Amer: >60 mL/min (ref >60)
GFR calc non Af Amer: >60 mL/min (ref >60)
Glucose, Bld: 123 mg/dL — ABNORMAL HIGH (ref 65–99)
POTASSIUM: 4 mmol/L (ref 3.5–5.1)
Sodium: 143 mmol/L (ref 135–145)

## 2017-03-18 LAB — GLUCOSE, CAPILLARY
Glucose-Capillary: 129 mg/dL — ABNORMAL HIGH (ref 65–99)
Glucose-Capillary: 102 mg/dL — ABNORMAL HIGH (ref 65–99)

## 2017-03-18 LAB — CBC
HCT: 31.4 % — ABNORMAL LOW (ref 36.0–46.0)
HEMOGLOBIN: 8.5 g/dL — AB (ref 12.0–15.0)
MCH: 21.6 pg — AB (ref 26.0–34.0)
MCHC: 27.1 g/dL — ABNORMAL LOW (ref 30.0–36.0)
MCV: 79.9 fL (ref 78.0–100.0)
PLATELETS: 250 10*3/uL (ref 150–400)
RBC: 3.93 MIL/uL (ref 3.87–5.11)
RDW: 18.6 % — ABNORMAL HIGH (ref 11.5–15.5)
WBC: 11.8 10*3/uL — ABNORMAL HIGH (ref 4.0–10.5)

## 2017-03-18 MED ORDER — MORPHINE SULFATE (CONCENTRATE) 10 MG/0.5ML PO SOLN: 15.0000 mg | ORAL | 0 refills | Status: DC | PRN

## 2017-03-18 MED ORDER — MORPHINE SULFATE ER 60 MG PO TBCR: 60.0000 mg | EXTENDED_RELEASE_TABLET | Freq: Three times a day (TID) | ORAL | 0 refills | Status: DC

## 2017-03-18 MED ORDER — HEPARIN SOD (PORK) LOCK FLUSH 100 UNIT/ML IV SOLN: 500.0000 [IU] | INTRAVENOUS | Status: DC

## 2017-03-18 MED ORDER — ACETAMINOPHEN 325 MG PO TABS: 650.0000 mg | ORAL_TABLET | Freq: Four times a day (QID) | ORAL | 0 refills | Status: AC | PRN

## 2017-03-18 MED ORDER — HEPARIN SOD (PORK) LOCK FLUSH 100 UNIT/ML IV SOLN
500.0000 [IU] | INTRAVENOUS | Status: DC | PRN
  Filled 2017-03-18: qty 5

## 2017-03-18 NOTE — Discharge Summary (Signed)
Physician Discharge Summary  [name] QQV:[id number] DOB: 02/07/1951 DOA: 03/15/2017  PCP: Jani Gravel, MD  Admit date: 03/15/2017 Discharge date: 03/18/2017  Recommendations for Outpatient Follow-up:  1. Check CBC and BMP on outpt basis 2. - Follow up with onc per sch appt   Discharge Diagnoses:  Active Problems:   Pain    Discharge Condition: stable   Diet recommendation: as tolerated   History of present illness:  67 y.o.femalewith history of RA, brain cancer, breast cancer, diabetes who presented to ED with intractable back pain. She was sent by oncologist for pain control.   Hospital Course:    Assessment & Plan:   Cancer related pain - Continue pain management efforts with MS contin and PRN morphine  Anemia of chronic disease - Stable   Widely metastatic malignancy of unknown primary - Had liver biopsy 1/31  RA - Continue Plaquenil and steroids   Depression - Continue Cymbalta     DVT prophylaxis: SCD's Code Status: full code  Family Communication: no family at bedside     Consultants:   IR  Procedures:   None   Antimicrobials:   None        Signed:  Leisa Lenz, MD  [facility] 03/18/2017, 9:31 AM  Pager #: [phone]  Time spent in minutes: more than 30 minutes    Discharge Exam: Vitals:   03/17/17 2001 03/18/17 0409  BP: (!) 147/81 (!) 150/67  Pulse: 78 82  Resp: 16 14  Temp: 98.2 F (36.8 C) 98 F (36.7 C)  SpO2: 91% 92%   Vitals:   03/17/17 0351 03/17/17 0949 03/17/17 2001 03/18/17 0409  BP: 139/69  (!) 147/81 (!) 150/67  Pulse: 65  78 82  Resp: _0 Temp: 98.3 F (36.8 C)  98.2 F (36.8 C) 98 F (36.7 C)  TempSrc: Oral  Axillary Oral  SpO2: 98% 98% 91% 92%  Weight: 74.2 kg (163 lb 9.6 oz)   74.4 kg (164 lb 0.4 oz)  Height:        General: Pt is alert, follows commands appropriately, not in acute distress Cardiovascular: Regular rate and rhythm, S1/S2 + Respiratory:  Clear to auscultation bilaterally, no wheezing, no crackles, no rhonchi Abdominal: Soft, non tender, non distended, bowel sounds +, no guarding Extremities: no cyanosis, pulses palpable bilaterally DP and PT Neuro: Grossly nonfocal  Discharge Instructions  Discharge Instructions    Call MD for:  persistant nausea and vomiting   Complete by:  As directed    Call MD for:  redness, tenderness, or signs of infection (pain, swelling, redness, odor or green/yellow discharge around incision site)   Complete by:  As directed    Call MD for:  severe uncontrolled pain   Complete by:  As directed    Diet - low sodium heart healthy   Complete by:  As directed    Increase activity slowly   Complete by:  As directed      Allergies as of 03/18/2017      Reactions   Aurothioglucose [solganal] Other (See Comments)   REACTION: VASCULITIS   Feldene [piroxicam] Anaphylaxis, Other (See Comments)   SKIN BLISTERS   Hydralazine Other (See Comments)   REACTION: Chest pains and headaches: IV only   Imdur [isosorbide Mononitrate] Other (See Comments)   REACTION: PATIENT IS UNABLE TO WALK OR FUNCTION   Imuran [azathioprine] Other (See Comments)   Could not walk or function   Methotrexate Derivatives Other (See Comments)  REACTION: VASCULITIS   Calcium Channel Blockers Palpitations   Sulfa Antibiotics Diarrhea, Nausea And Vomiting   Thimerosal Other (See Comments)   SKIN BLISTERED   Vioxx [rofecoxib] Other (See Comments)   REACTION: CAUSED MORE JOINT PAIN   Arava [leflunomide]    UNSPECIFIED REACTION    Asacol [mesalamine]    UNSPECIFIED REACTION    Iohexol Other (See Comments)   PT STATES SHE WAS TOLD NEVER TO HAVE CT CONTRAST AGAIN.  SHE HAD SOME KIND OF REACTION BUT DOESNT REMEBER WHAT HAPPENED   Hydrocodone Itching   Latex Rash   Neomycin Rash   Nickel Rash   Nsaids Other (See Comments)   GI UPSET , CAN TOLERATE SOME NSAIDS   Percocet [oxycodone-acetaminophen] Other (See Comments)    INSOMNIA      Medication List    STOP taking these medications   dexamethasone 4 MG tablet Commonly known as:  DECADRON   morphine 20 MG/5ML solution Replaced by:  morphine CONCENTRATE 10 MG/0.5ML Soln concentrated solution You also have another medication with the same name that you need to continue taking as instructed.     TAKE these medications   acetaminophen 325 MG tablet Commonly known as:  TYLENOL Take 2 tablets (650 mg total) by mouth every 6 (six) hours as needed for mild pain (or Fever >/= 101).   allopurinol 300 MG tablet Commonly known as:  ZYLOPRIM Take 300 mg by mouth daily.   atorvastatin 20 MG tablet Commonly known as:  LIPITOR Take 20 mg by mouth at bedtime.   BYSTOLIC 5 MG tablet Generic drug:  nebivolol Take 5 mg by mouth every evening.   CALTRATE 600+D PLUS 600-400 MG-UNIT per tablet Chew 1 tablet by mouth 2 (two) times daily.   DULoxetine 60 MG capsule Commonly known as:  CYMBALTA Take 60 mg by mouth daily.   FIBER SELECT GUMMIES PO Take 2 each by mouth 2 (two) times daily.   folic acid 1 MG tablet Commonly known as:  FOLVITE Take 1 tablet (1 mg total) by mouth daily. Start 5-7 days before Alimta chemotherapy. Continue until 21 days after Alimta completed.   hydroxychloroquine 200 MG tablet Commonly known as:  PLAQUENIL Take 200 mg by mouth daily.   ipratropium 0.06 % nasal spray Commonly known as:  ATROVENT Place 2 sprays into both nostrils 2 (two) times daily as needed for rhinitis.   lidocaine-prilocaine cream Commonly known as:  EMLA Apply to affected area once   LORazepam 0.5 MG tablet Commonly known as:  ATIVAN Take 1 tablet (0.5 mg total) by mouth every 6 (six) hours as needed (Nausea or vomiting).   MAGNESIUM OXIDE PO Take 500 mg by mouth daily.   metFORMIN 500 MG 24 hr tablet Commonly known as:  GLUCOPHAGE-XR Take 500 mg by mouth 2 (two) times a week. No specific days   methylPREDNISolone 8 MG tablet Commonly known  as:  MEDROL Take 8 mg by mouth daily.   morphine 60 MG 12 hr tablet Commonly known as:  MS CONTIN Take 1 tablet (60 mg total) by mouth 3 (three) times daily. What changed:    medication strength  how much to take  when to take this  Another medication with the same name was removed. Continue taking this medication, and follow the directions you see here.   morphine CONCENTRATE 10 MG/0.5ML Soln concentrated solution Take 0.75 mLs (15 mg total) by mouth every 3 (three) hours as needed for moderate pain. What changed:  You were  already taking a medication with the same name, and this prescription was added. Make sure you understand how and when to take each. Replaces:  morphine 20 MG/5ML solution   omeprazole 40 MG capsule Commonly known as:  PRILOSEC TAKE (1) CAPSULE BY MOUTH ONCE DAILY. What changed:  See the new instructions.   ondansetron 8 MG tablet Commonly known as:  ZOFRAN Take 1 tablet (8 mg total) by mouth 2 (two) times daily as needed for refractory nausea / vomiting. Start on day 3 after chemo.   prochlorperazine 10 MG tablet Commonly known as:  COMPAZINE Take 1 tablet (10 mg total) by mouth every 6 (six) hours as needed (Nausea or vomiting).   SYSTANE 0.4-0.3 % Gel ophthalmic gel Generic drug:  Polyethyl Glycol-Propyl Glycol Apply 1 application to eye daily as needed (for dry eyes).   Vitamin D 2000 units tablet Take 2,000 Units by mouth daily.   XARELTO 20 MG Tabs tablet Generic drug:  rivaroxaban Take 1 tablet by mouth daily.      Follow-up Information    Jani Gravel, MD. Schedule an appointment as soon as possible for a visit.   Specialty:  Internal Medicine Contact information: [address] [place] [address] [phone]            The results of significant diagnostics from this hospitalization (including imaging, microbiology, ancillary and laboratory) are listed below for reference.    Significant Diagnostic  Studies: Mr Virgel Paling BT Contrast  Result Date: 03/10/2017 CLINICAL DATA:  Stroke follow-up. History of aneurysm coiling. Weakness and difficulty walking. Cervical and breast cancer history. EXAM: MRA NECK WITHOUT CONTRAST MRA HEAD WITHOUT CONTRAST TECHNIQUE: Multiplanar and multiecho pulse sequences of the neck were obtained without intravenous contrast. Angiographic images of the neck were obtained using MRA technique without intravenous contrast.; Angiographic images of the Circle of Willis were obtained using MRA technique without intravenous contrast. COMPARISON:  None. FINDINGS: MRA NECK FINDINGS Normal three-vessel aortic arch branching pattern. The visualized portions of the subclavian arteries are normal. Both vertebral artery origins are normal. The vertebral system is left dominant. Moderate narrowing of the left vertebral artery mid V2 segment (series 6, image 128). The remainder of the left vertebral artery course is normal. No focal stenosis of the right vertebral artery. There is moderate atherosclerotic plaque at the right carotid bifurcation causing approximately 50% narrowing of the distal right common carotid artery. No hemodynamically significant stenosis of the right internal carotid artery. Atherosclerotic plaque at the left carotid bifurcation extending into the left internal carotid artery results in critical stenosis of the left ICA with severe narrowing along the remainder of the cervical internal carotid artery course. This has worsened slightly relative to 05/25/2011. MRA HEAD FINDINGS Intracranial internal carotid arteries: Minimal flow related enhancement of the left internal carotid artery to the ophthalmic segment. No flow related enhancement within the left carotid terminus. No right internal carotid artery stenosis. Status post coiling of right P-comm origin aneurysm. No residual aneurysm filling. Anterior cerebral arteries: Normal right anterior cerebral artery. There is no flow  related enhancement within the left ACA A1 segment. Enhancement of the left A2 segment is via the anterior communicating artery. Middle cerebral arteries: Normal right MCA. No flow related enhancement in the left MCA M1 segment. Unchanged degree of collateralization in the distal left MCA distribution. Posterior communicating arteries: Patent on the left. Incomplete on the right. Status post coiling of right P-comm origin aneurysm. Posterior cerebral arteries: Normal. Basilar artery: Normal.  Vertebral arteries: Left dominant. Normal. Superior cerebellar arteries: Normal. Anterior inferior cerebellar arteries: Normal. Posterior inferior cerebellar arteries: Normal. IMPRESSION: 1. Critical stenosis of the left internal carotid artery, slightly worsened at the left ICA origin, but otherwise unchanged with minimal flow related enhancement to the level of the ophthalmic segment and no enhancement beyond. No right ICA stenosis. 2. Unchanged occlusion of the left anterior cerebral artery A 1 segment and left middle cerebral artery M 1 segment. 3. Unchanged moderate narrowing of the mid P 2 segment of the left vertebral artery. This may be exaggerated by changes in flow direction on this time-of-flight study. 4. Status post coiling of right P-comm origin aneurysm without residual aneurysm filling. Electronically Signed   By: Ulyses Jarred M.D.   On: 03/10/2017 14:27   Mr Jodene Nam Neck Wo Contrast  Result Date: 03/10/2017 CLINICAL DATA:  Stroke follow-up. History of aneurysm coiling. Weakness and difficulty walking. Cervical and breast cancer history. EXAM: MRA NECK WITHOUT CONTRAST MRA HEAD WITHOUT CONTRAST TECHNIQUE: Multiplanar and multiecho pulse sequences of the neck were obtained without intravenous contrast. Angiographic images of the neck were obtained using MRA technique without intravenous contrast.; Angiographic images of the Circle of Willis were obtained using MRA technique without intravenous contrast.  COMPARISON:  None. FINDINGS: MRA NECK FINDINGS Normal three-vessel aortic arch branching pattern. The visualized portions of the subclavian arteries are normal. Both vertebral artery origins are normal. The vertebral system is left dominant. Moderate narrowing of the left vertebral artery mid V2 segment (series 6, image 128). The remainder of the left vertebral artery course is normal. No focal stenosis of the right vertebral artery. There is moderate atherosclerotic plaque at the right carotid bifurcation causing approximately 50% narrowing of the distal right common carotid artery. No hemodynamically significant stenosis of the right internal carotid artery. Atherosclerotic plaque at the left carotid bifurcation extending into the left internal carotid artery results in critical stenosis of the left ICA with severe narrowing along the remainder of the cervical internal carotid artery course. This has worsened slightly relative to 05/25/2011. MRA HEAD FINDINGS Intracranial internal carotid arteries: Minimal flow related enhancement of the left internal carotid artery to the ophthalmic segment. No flow related enhancement within the left carotid terminus. No right internal carotid artery stenosis. Status post coiling of right P-comm origin aneurysm. No residual aneurysm filling. Anterior cerebral arteries: Normal right anterior cerebral artery. There is no flow related enhancement within the left ACA A1 segment. Enhancement of the left A2 segment is via the anterior communicating artery. Middle cerebral arteries: Normal right MCA. No flow related enhancement in the left MCA M1 segment. Unchanged degree of collateralization in the distal left MCA distribution. Posterior communicating arteries: Patent on the left. Incomplete on the right. Status post coiling of right P-comm origin aneurysm. Posterior cerebral arteries: Normal. Basilar artery: Normal. Vertebral arteries: Left dominant. Normal. Superior cerebellar  arteries: Normal. Anterior inferior cerebellar arteries: Normal. Posterior inferior cerebellar arteries: Normal. IMPRESSION: 1. Critical stenosis of the left internal carotid artery, slightly worsened at the left ICA origin, but otherwise unchanged with minimal flow related enhancement to the level of the ophthalmic segment and no enhancement beyond. No right ICA stenosis. 2. Unchanged occlusion of the left anterior cerebral artery A 1 segment and left middle cerebral artery M 1 segment. 3. Unchanged moderate narrowing of the mid P 2 segment of the left vertebral artery. This may be exaggerated by changes in flow direction on this time-of-flight study. 4. Status post coiling of right  P-comm origin aneurysm without residual aneurysm filling. Electronically Signed   By: Ulyses Jarred M.D.   On: 03/10/2017 14:27   Mr Jeri Cos DG Contrast  Result Date: 02/21/2017 CLINICAL DATA:  Breast cancer. Metastatic lung cancer. History of right posterior communicating artery aneurysm coiling. EXAM: MRI HEAD WITHOUT AND WITH CONTRAST TECHNIQUE: Multiplanar, multiecho pulse sequences of the brain and surrounding structures were obtained without and with intravenous contrast. CONTRAST:  15 mL MultiHance IV COMPARISON:  MRI head 01/30/2017, 05/25/2011 FINDINGS: Brain: New area of restricted diffusion head of caudate on the left compatible with acute/subacute infarct. Previously noted small areas of restricted diffusion in the right occipital lobe now shows ill-defined enhancement, most consistent with subacute infarct. Small areas of restricted diffusion left parietal cortex and left posterior temporal lobe have resolved. Findings most compatible with ischemia rather than metastatic disease. Chronic microvascular ischemic changes in the white matter and pons. Small chronic infarct left frontal lobe. Negative for hemorrhage. Postcontrast imaging degraded by motion. 6 mm enhancing lesion right occipital lobe has angular margins and  most compatible with subacute enhancing infarct based on prior MRI and morphology. No other worrisome enhancing lesions. Vascular: Prior coiling of right posterior communicating artery aneurysm with susceptibility. Chronic occlusion left internal carotid artery Skull and upper cervical spine: C3 vertebral body lesion has progressed in the interval compatible with metastatic disease. No calvarial lesions. Sinuses/Orbits: Negative Other: None IMPRESSION: When compared with the prior MRI of 01/30/2017, there is improvement in the areas of restricted diffusion in the left parietal and left temporal lobe compatible with infarction. There enhancement of the small lesion in the right occipital lobe most consistent with subacute infarction. There is a new area of subacute infarct in the head of caudate on the left. Chronic occlusion left internal carotid artery with chronic ischemic changes as above. No definite metastatic disease in the brain. Continued close followup is warranted. Progression of bone marrow lesion in the C3 vertebral body compatible with metastatic disease. Electronically Signed   By: Franchot Gallo M.D.   On: 02/21/2017 07:52   US Biopsy (liver)  Result Date: 03/16/2017 INDICATION: Metastatic lung cancer, hepatic mass EXAM: ULTRASOUND GUIDED CORE BIOPSY OF RIGHT HEPATIC MASS MEDICATIONS: 1% LIDOCAINE LOCAL ANESTHESIA/SEDATION: Versed 2.51m IV; Fentanyl 1055m IV; Moderate Sedation Time:  11 MINUTES The patient was continuously monitored during the procedure by the interventional radiology nurse under my direct supervision. FLUOROSCOPY TIME:  Fluoroscopy Time: NONE. COMPLICATIONS: None immediate. PROCEDURE: The procedure, risks, benefits, and alternatives were explained to the patient. Questions regarding the procedure were encouraged and answered. The patient understands and consents to the procedure. Previous imaging reviewed. Patient positioned right anterior oblique. Preliminary ultrasound  performed. The 3.6 cm hypoechoic solid lesion in the right hepatic dome was localized. Overlying skin marked. Under sterile conditions and local anesthesia, a 17 gauge 11.8 cm access needle was advanced percutaneously into the lesion. Needle position confirmed with ultrasound. Images obtained for documentation. 3 18 gauge core biopsies obtained. Samples placed in formalin. Needle tract occluded with Gel-Foam. Postprocedure imaging demonstrates no hemorrhage or hematoma. Patient tolerated the biopsy well. FINDINGS: Imaging confirms needle placed into the right hepatic dome lesion for core biopsy IMPRESSION: Successful ultrasound right hepatic dome mass 18 gauge core biopsy Electronically Signed   By: M.Jerilynn Mages Shick M.D.   On: 03/16/2017 13:03   Ir UsKoreauide Vasc Access Right  Result Date: 03/08/2017 INDICATION: [age]ear old with metastatic non-small cell lung cancer. Planning for chemotherapy. EXAM: FLUOROSCOPIC AND ULTRASOUND GUIDED  PLACEMENT OF A SUBCUTANEOUS PORT COMPARISON:  None. MEDICATIONS: Ancef 2 g; The antibiotic was administered within an appropriate time interval prior to skin puncture. ANESTHESIA/SEDATION: Versed 2.0 mg IV; Fentanyl 100 mcg IV; Moderate Sedation Time:  36 minutes The patient was continuously monitored during the procedure by the interventional radiology nurse under my direct supervision. FLUOROSCOPY TIME:  18 seconds, 3 mGy COMPLICATIONS: None immediate. PROCEDURE: The procedure, risks, benefits, and alternatives were explained to the patient. Questions regarding the procedure were encouraged and answered. The patient understands and consents to the procedure. Patient was placed supine on the interventional table. Ultrasound confirmed a patent right internal jugular vein. The right chest and neck were cleaned with a skin antiseptic and a sterile drape was placed. Maximal barrier sterile technique was utilized including caps, mask, sterile gowns, sterile gloves, sterile drape, hand hygiene  and skin antiseptic. The right neck was anesthetized with 1% lidocaine. Small incision was made in the right neck with a blade. Micropuncture set was placed in the right internal jugular vein with ultrasound guidance. The micropuncture wire was used for measurement purposes. The right chest was anesthetized with 1% lidocaine with epinephrine. #15 blade was used to make an incision and a subcutaneous port pocket was formed. [place] was assembled. Subcutaneous tunnel was formed with a stiff tunneling device. The port catheter was brought through the subcutaneous tunnel. The port was placed in the subcutaneous pocket and sutured in place. The micropuncture set was exchanged for a peel-away sheath. The catheter was placed through the peel-away sheath and the tip was positioned at the superior cavoatrial junction. Catheter placement was confirmed with fluoroscopy. The port was accessed and flushed with heparinized saline. The port pocket was closed using two layers of absorbable sutures and Dermabond. The vein skin site was closed using a single layer of absorbable suture and Dermabond. Sterile dressings were applied. Patient tolerated the procedure well without an immediate complication. Ultrasound and fluoroscopic images were taken and saved for this procedure. IMPRESSION: Placement of a subcutaneous port device. Electronically Signed   By: Markus Daft M.D.   On: 03/08/2017 16:54   Ct Biopsy  Result Date: 02/21/2017 INDICATION: Bone lesions EXAM: CT BIOPSY; CT BONE MARROW BIOPSY AND ASPIRATION MEDICATIONS: None. ANESTHESIA/SEDATION: Fentanyl 100 mcg IV; Versed 2 mg IV Moderate Sedation Time:  20 minutes The patient was continuously monitored during the procedure by the interventional radiology nurse under my direct supervision. FLUOROSCOPY TIME:  Fluoroscopy Time:  minutes  seconds ( mGy). COMPLICATIONS: None immediate. PROCEDURE: Informed written consent was obtained from the patient after a thorough  discussion of the procedural risks, benefits and alternatives. All questions were addressed. Maximal Sterile Barrier Technique was utilized including caps, mask, sterile gowns, sterile gloves, sterile drape, hand hygiene and skin antiseptic. A timeout was performed prior to the initiation of the procedure. Under CT guidance, a(n) 11 gauge guide needle was advanced into the right iliac bone. Aspirates and a core were obtained. Subsequently, the 11 gauge needle was directed towards a sclerotic bone lesion in the right iliac bone and a final 11 gauge core was obtained. Post biopsy images demonstrate no hemorrhage. Patient tolerated the procedure well without complication. Vital sign monitoring by nursing staff during the procedure will continue as patient is in the special procedures unit for post procedure observation. The images document guide needle placement within the normal portion of the right iliac bone. Subsequent images demonstrate needle placement in the sclerotic lesion within the right iliac bone.  Post biopsy images demonstrate no hemorrhage. FINDINGS: The images document guide needle placement within the normal portion of the right iliac bone. Subsequent images demonstrate needle placement in the sclerotic lesion within the right iliac bone. Post biopsy images demonstrate no hemorrhage. IMPRESSION: Successful right sclerotic bone lesion core biopsy. Successful right iliac bone marrow biopsy. Electronically Signed   By: Marybelle Killings M.D.   On: 02/21/2017 11:20   Ct Biopsy  Result Date: 02/21/2017 INDICATION: Bone lesions EXAM: CT BIOPSY; CT BONE MARROW BIOPSY AND ASPIRATION MEDICATIONS: None. ANESTHESIA/SEDATION: Fentanyl 100 mcg IV; Versed 2 mg IV Moderate Sedation Time:  20 minutes The patient was continuously monitored during the procedure by the interventional radiology nurse under my direct supervision. FLUOROSCOPY TIME:  Fluoroscopy Time:  minutes  seconds ( mGy). COMPLICATIONS: None immediate.  PROCEDURE: Informed written consent was obtained from the patient after a thorough discussion of the procedural risks, benefits and alternatives. All questions were addressed. Maximal Sterile Barrier Technique was utilized including caps, mask, sterile gowns, sterile gloves, sterile drape, hand hygiene and skin antiseptic. A timeout was performed prior to the initiation of the procedure. Under CT guidance, a(n) 11 gauge guide needle was advanced into the right iliac bone. Aspirates and a core were obtained. Subsequently, the 11 gauge needle was directed towards a sclerotic bone lesion in the right iliac bone and a final 11 gauge core was obtained. Post biopsy images demonstrate no hemorrhage. Patient tolerated the procedure well without complication. Vital sign monitoring by nursing staff during the procedure will continue as patient is in the special procedures unit for post procedure observation. The images document guide needle placement within the normal portion of the right iliac bone. Subsequent images demonstrate needle placement in the sclerotic lesion within the right iliac bone. Post biopsy images demonstrate no hemorrhage. FINDINGS: The images document guide needle placement within the normal portion of the right iliac bone. Subsequent images demonstrate needle placement in the sclerotic lesion within the right iliac bone. Post biopsy images demonstrate no hemorrhage. IMPRESSION: Successful right sclerotic bone lesion core biopsy. Successful right iliac bone marrow biopsy. Electronically Signed   By: Marybelle Killings M.D.   On: 02/21/2017 11:20   Ct Bone Marrow Biopsy & Aspiration  Result Date: 02/21/2017 INDICATION: Bone lesions EXAM: CT BIOPSY; CT BONE MARROW BIOPSY AND ASPIRATION MEDICATIONS: None. ANESTHESIA/SEDATION: Fentanyl 100 mcg IV; Versed 2 mg IV Moderate Sedation Time:  20 minutes The patient was continuously monitored during the procedure by the interventional radiology nurse under my direct  supervision. FLUOROSCOPY TIME:  Fluoroscopy Time:  minutes  seconds ( mGy). COMPLICATIONS: None immediate. PROCEDURE: Informed written consent was obtained from the patient after a thorough discussion of the procedural risks, benefits and alternatives. All questions were addressed. Maximal Sterile Barrier Technique was utilized including caps, mask, sterile gowns, sterile gloves, sterile drape, hand hygiene and skin antiseptic. A timeout was performed prior to the initiation of the procedure. Under CT guidance, a(n) 11 gauge guide needle was advanced into the right iliac bone. Aspirates and a core were obtained. Subsequently, the 11 gauge needle was directed towards a sclerotic bone lesion in the right iliac bone and a final 11 gauge core was obtained. Post biopsy images demonstrate no hemorrhage. Patient tolerated the procedure well without complication. Vital sign monitoring by nursing staff during the procedure will continue as patient is in the special procedures unit for post procedure observation. The images document guide needle placement within the normal portion of the right iliac bone.  Subsequent images demonstrate needle placement in the sclerotic lesion within the right iliac bone. Post biopsy images demonstrate no hemorrhage. FINDINGS: The images document guide needle placement within the normal portion of the right iliac bone. Subsequent images demonstrate needle placement in the sclerotic lesion within the right iliac bone. Post biopsy images demonstrate no hemorrhage. IMPRESSION: Successful right sclerotic bone lesion core biopsy. Successful right iliac bone marrow biopsy. Electronically Signed   By: Marybelle Killings M.D.   On: 02/21/2017 11:20   Ir Fluoro Guide Port Insertion Right  Result Date: 03/08/2017 INDICATION: 67 year old with metastatic non-small cell lung cancer. Planning for chemotherapy. EXAM: FLUOROSCOPIC AND ULTRASOUND GUIDED PLACEMENT OF A SUBCUTANEOUS PORT COMPARISON:  None.  MEDICATIONS: Ancef 2 g; The antibiotic was administered within an appropriate time interval prior to skin puncture. ANESTHESIA/SEDATION: Versed 2.0 mg IV; Fentanyl 100 mcg IV; Moderate Sedation Time:  36 minutes The patient was continuously monitored during the procedure by the interventional radiology nurse under my direct supervision. FLUOROSCOPY TIME:  18 seconds, 3 mGy COMPLICATIONS: None immediate. PROCEDURE: The procedure, risks, benefits, and alternatives were explained to the patient. Questions regarding the procedure were encouraged and answered. The patient understands and consents to the procedure. Patient was placed supine on the interventional table. Ultrasound confirmed a patent right internal jugular vein. The right chest and neck were cleaned with a skin antiseptic and a sterile drape was placed. Maximal barrier sterile technique was utilized including caps, mask, sterile gowns, sterile gloves, sterile drape, hand hygiene and skin antiseptic. The right neck was anesthetized with 1% lidocaine. Small incision was made in the right neck with a blade. Micropuncture set was placed in the right internal jugular vein with ultrasound guidance. The micropuncture wire was used for measurement purposes. The right chest was anesthetized with 1% lidocaine with epinephrine. #15 blade was used to make an incision and a subcutaneous port pocket was formed. [place] was assembled. Subcutaneous tunnel was formed with a stiff tunneling device. The port catheter was brought through the subcutaneous tunnel. The port was placed in the subcutaneous pocket and sutured in place. The micropuncture set was exchanged for a peel-away sheath. The catheter was placed through the peel-away sheath and the tip was positioned at the superior cavoatrial junction. Catheter placement was confirmed with fluoroscopy. The port was accessed and flushed with heparinized saline. The port pocket was closed using two layers of  absorbable sutures and Dermabond. The vein skin site was closed using a single layer of absorbable suture and Dermabond. Sterile dressings were applied. Patient tolerated the procedure well without an immediate complication. Ultrasound and fluoroscopic images were taken and saved for this procedure. IMPRESSION: Placement of a subcutaneous port device. Electronically Signed   By: Markus Daft M.D.   On: 03/08/2017 16:54    Microbiology: No results found for this or any previous visit (from the past 240 hour(s)).   Labs: Basic Metabolic Panel: Recent Labs  Lab 03/14/17 1044 03/15/17 2258 03/16/17 0416 03/17/17 0440 03/18/17 0500  NA 140 141 141 143 143  K 3.4* 3.7 3.8 3.7 4.0  CL 104 105 105 109 107  CO2 _0 GLUCOSE 224* 203* 145* 98 123*  BUN _1 CREATININE 0.68 0.58 0.60 0.62 0.58  CALCIUM 9.4 9.1 8.8* 8.7* 9.4   Liver Function Tests: Recent Labs  Lab 03/14/17 1044 03/16/17 0416  AST 28 23  ALT 81* 58*  ALKPHOS 259* 271*  BILITOT 0.4  0.7  PROT 6.0* 6.2*  ALBUMIN 3.0* 3.1*   No results for input(s): LIPASE, AMYLASE in the last 168 hours. No results for input(s): AMMONIA in the last 168 hours. CBC: Recent Labs  Lab 03/14/17 1044 03/15/17 2258 03/16/17 0431 03/17/17 0440 03/18/17 0500  WBC 18.9* 15.6* 14.4* 14.0* 11.8*  NEUTROABS 14.6  --   --   --   --   HGB 8.4* 8.9* 8.8* 8.1* 8.5*  HCT 31.0* 31.7* 31.3* 28.3* 31.4*  MCV 79.5 77.5* 77.9* 78.0 79.9  PLT 282 239 253 220 250   Cardiac Enzymes: No results for input(s): CKTOTAL, CKMB, CKMBINDEX, TROPONINI in the last 168 hours. BNP: BNP (last 3 results) No results for input(s): BNP in the last 8760 hours.  ProBNP (last 3 results) No results for input(s): PROBNP in the last 8760 hours.  CBG: Recent Labs  Lab 03/17/17 1847 03/17/17 2000 03/17/17 2356 03/18/17 0406 03/18/17 0733  GLUCAP 162* 145* 146* 129* 102*

## 2017-03-18 NOTE — Progress Notes (Signed)
Pt discharged home in stable condition. Discharge instructions and scripts given. Pt and spouse verbalized understanding. No immediate questions or concerns at this time. Pt discharged from unit via wheelchair.

## 2017-03-18 NOTE — Discharge Instructions (Signed)
Morphine sustained-release tablets What is this medicine? MORPHINE (MOR feen) is a pain reliever. It is used to treat moderate to severe pain that lasts for more than a few days. This medicine may be used for other purposes; ask your health care provider or pharmacist if you have questions. COMMON BRAND NAME(S): ARYMO ER, MORPHABOND, MS Contin, Oramorph SR What should I tell my health care provider before I take this medicine? They need to know if you have any of these conditions: -brain tumor -drug abuse or addiction -head injury -heart disease -if you often drink alcohol -liver disease -lung or breathing disease, like asthma -problems urinating -seizures -stomach or intestine problems -taken an MAOI like Carbex, Eldepryl, Marplan, Nardil, or Parnate in the last 14 days -an unusual or allergic reaction to morphine, other medications, foods, dyes, or preservatives -pregnant or trying to get pregnant -breast-feeding How should I use this medicine? Take this medicine by mouth with a glass of water. Do not break, crush, or chew the medicine. Do not take a tablet that is not whole. A broken or crushed tablet can be very dangerous. You may get too much medicine. If the medicine upsets your stomach, take it with food or milk. Follow the directions on the prescription label. Take the medicine at the same time each day. Do not take more medicine than you are told to take. A special MedGuide will be given to you by the pharmacist with each prescription and refill. Be sure to read this information carefully each time. Talk to your pediatrician regarding the use of this medicine in children. Special care may be needed. Overdosage: If you think you have taken too much of this medicine contact a poison control center or emergency room at once. NOTE: This medicine is only for you. Do not share this medicine with others. What if I miss a dose? If you miss a dose, take it as soon as you can. If it is  almost time for your next dose, take only that dose. Do not take double or extra doses. What may interact with this medicine? This medicine may interact with the following medications: -alcohol -antihistamines for allergy, cough and cold -atropine -certain medicines for anxiety or sleep -certain medicines for bladder problems like oxybutynin, tolterodine -certain medicines for depression like amitriptyline, fluoxetine, sertraline -certain medicines for Parkinson's disease like benztropine, trihexyphenidyl -certain medicines for seizures like phenobarbital, primidone -certain medicines for stomach problems like dicyclomine, hyoscyamine -certain medicines for travel sickness like scopolamine -cimetidine -general anesthetics like halothane, isoflurane, methoxyflurane, propofol -ipratropium -local anesthetics like lidocaine, pramoxine, tetracaine -MAOIs like Carbex, Eldepryl, Marplan, Nardil, and Parnate -medicines that relax muscles for surgery -other narcotic medicines for pain or cough -phenothiazines like chlorpromazine, mesoridazine, prochlorperazine, thioridazine This list may not describe all possible interactions. Give your health care provider a list of all the medicines, herbs, non-prescription drugs, or dietary supplements you use. Also tell them if you smoke, drink alcohol, or use illegal drugs. Some items may interact with your medicine. What should I watch for while using this medicine? Tell your doctor or health care professional if your pain does not go away, if it gets worse, or if you have new or a different type of pain. You may develop tolerance to the medicine. Tolerance means that you will need a higher dose of the medicine for pain relief. Tolerance is normal and is expected if you take this medicine for a long time. Do not suddenly stop taking your medicine because you may  develop a severe reaction. Your body becomes used to the medicine. This does NOT mean you are  addicted. Addiction is a behavior related to getting and using a drug for a non-medical reason. If you have pain, you have a medical reason to take pain medicine. Your doctor will tell you how much medicine to take. If your doctor wants you to stop the medicine, the dose will be slowly lowered over time to avoid any side effects. There are different types of narcotic medicines (opiates). If you take more than one type at the same time or if you are taking another medicine that also causes drowsiness, you may have more side effects. Give your health care provider a list of all medicines you use. Your doctor will tell you how much medicine to take. Do not take more medicine than directed. Call emergency for help if you have problems breathing or unusual sleepiness. You may get drowsy or dizzy. Do not drive, use machinery, or do anything that needs mental alertness until you know how this medicine affects you. Do not stand or sit up quickly, especially if you are an older patient. This reduces the risk of dizzy or fainting spells. Alcohol may interfere with the effect of this medicine. Avoid alcoholic drinks. This medicine will cause constipation. Try to have a bowel movement at least every 2 to 3 days. If you do not have a bowel movement for 3 days, call your doctor or health care professional. Your mouth may get dry. Chewing sugarless gum or sucking hard candy, and drinking plenty of water may help. Contact your doctor if the problem does not go away or is severe. What side effects may I notice from receiving this medicine? Side effects that you should report to your doctor or health care professional as soon as possible: -allergic reactions like skin rash, itching or hives, swelling of the face, lips, or tongue -breathing problems -confusion -seizures -signs and symptoms of low blood pressure like dizziness; feeling faint or lightheaded, falls; unusually weak or tired -trouble passing urine or change in  the amount of urine Side effects that usually do not require medical attention (report to your doctor or health care professional if they continue or are bothersome): -constipation -dry mouth -nausea, vomiting -tiredness This list may not describe all possible side effects. Call your doctor for medical advice about side effects. You may report side effects to FDA at 1-800-FDA-1088. Where should I keep my medicine? Keep out of the reach of children. This medicine can be abused. Keep your medicine in a safe place to protect it from theft. Do not share this medicine with anyone. Selling or giving away this medicine is dangerous and is against the law. Store at room temperature between 15 and 30 degrees C (59 and 86 degrees F). Protect from light. This medicine may cause accidental overdose and death if it is taken by other adults, children, or pets. Flush any unused medicine down the toilet to reduce the chance of harm. Do not use the medicine after the expiration date. NOTE: This sheet is a summary. It may not cover all possible information. If you have questions about this medicine, talk to your doctor, pharmacist, or health care provider.  2018 [employer] (2014-10-27 15:34:04)

## 2017-03-19 ENCOUNTER — Encounter (HOSPITAL_COMMUNITY): Payer: Self-pay | Admitting: *Deleted

## 2017-03-19 ENCOUNTER — Emergency Department (HOSPITAL_COMMUNITY): Payer: Medicare Other

## 2017-03-19 ENCOUNTER — Other Ambulatory Visit: Payer: Self-pay

## 2017-03-19 ENCOUNTER — Inpatient Hospital Stay (HOSPITAL_COMMUNITY)
Admission: EM | Admit: 2017-03-19 | Discharge: 2017-03-23 | DRG: 308 | Disposition: A | Discharge status: Skilled Nursing Facility | Payer: Medicare Other | Attending: Internal Medicine | Admitting: Internal Medicine

## 2017-03-19 DIAGNOSIS — C220 Liver cell carcinoma: Secondary | ICD-10-CM | POA: Diagnosis not present

## 2017-03-19 DIAGNOSIS — G893 Neoplasm related pain (acute) (chronic): Secondary | ICD-10-CM | POA: Diagnosis present

## 2017-03-19 DIAGNOSIS — E119 Type 2 diabetes mellitus without complications: Secondary | ICD-10-CM

## 2017-03-19 DIAGNOSIS — C3412 Malignant neoplasm of upper lobe, left bronchus or lung: Secondary | ICD-10-CM | POA: Diagnosis not present

## 2017-03-19 DIAGNOSIS — Z90710 Acquired absence of both cervix and uterus: Secondary | ICD-10-CM

## 2017-03-19 DIAGNOSIS — F329 Major depressive disorder, single episode, unspecified: Secondary | ICD-10-CM | POA: Diagnosis present

## 2017-03-19 DIAGNOSIS — R000 Tachycardia, unspecified: Secondary | ICD-10-CM | POA: Diagnosis not present

## 2017-03-19 DIAGNOSIS — M069 Rheumatoid arthritis, unspecified: Secondary | ICD-10-CM | POA: Diagnosis not present

## 2017-03-19 DIAGNOSIS — Z66 Do not resuscitate: Secondary | ICD-10-CM | POA: Diagnosis present

## 2017-03-19 DIAGNOSIS — Z91041 Radiographic dye allergy status: Secondary | ICD-10-CM

## 2017-03-19 DIAGNOSIS — I350 Nonrheumatic aortic (valve) stenosis: Secondary | ICD-10-CM | POA: Diagnosis present

## 2017-03-19 DIAGNOSIS — I509 Heart failure, unspecified: Secondary | ICD-10-CM

## 2017-03-19 DIAGNOSIS — I5030 Unspecified diastolic (congestive) heart failure: Secondary | ICD-10-CM | POA: Diagnosis not present

## 2017-03-19 DIAGNOSIS — I4891 Unspecified atrial fibrillation: Secondary | ICD-10-CM | POA: Diagnosis not present

## 2017-03-19 DIAGNOSIS — R7989 Other specified abnormal findings of blood chemistry: Secondary | ICD-10-CM

## 2017-03-19 DIAGNOSIS — Z809 Family history of malignant neoplasm, unspecified: Secondary | ICD-10-CM

## 2017-03-19 DIAGNOSIS — Z8673 Personal history of transient ischemic attack (TIA), and cerebral infarction without residual deficits: Secondary | ICD-10-CM

## 2017-03-19 DIAGNOSIS — I7300 Raynaud's syndrome without gangrene: Secondary | ICD-10-CM | POA: Diagnosis present

## 2017-03-19 DIAGNOSIS — C787 Secondary malignant neoplasm of liver and intrahepatic bile duct: Secondary | ICD-10-CM | POA: Diagnosis present

## 2017-03-19 DIAGNOSIS — I110 Hypertensive heart disease with heart failure: Secondary | ICD-10-CM | POA: Diagnosis present

## 2017-03-19 DIAGNOSIS — Z8051 Family history of malignant neoplasm of kidney: Secondary | ICD-10-CM

## 2017-03-19 DIAGNOSIS — E7800 Pure hypercholesterolemia, unspecified: Secondary | ICD-10-CM | POA: Diagnosis present

## 2017-03-19 DIAGNOSIS — Z803 Family history of malignant neoplasm of breast: Secondary | ICD-10-CM

## 2017-03-19 DIAGNOSIS — R778 Other specified abnormalities of plasma proteins: Secondary | ICD-10-CM

## 2017-03-19 DIAGNOSIS — E876 Hypokalemia: Secondary | ICD-10-CM | POA: Diagnosis not present

## 2017-03-19 DIAGNOSIS — F419 Anxiety disorder, unspecified: Secondary | ICD-10-CM | POA: Diagnosis present

## 2017-03-19 DIAGNOSIS — I82412 Acute embolism and thrombosis of left femoral vein: Secondary | ICD-10-CM | POA: Diagnosis present

## 2017-03-19 DIAGNOSIS — Z510 Encounter for antineoplastic radiation therapy: Secondary | ICD-10-CM | POA: Diagnosis not present

## 2017-03-19 DIAGNOSIS — I10 Essential (primary) hypertension: Secondary | ICD-10-CM | POA: Diagnosis not present

## 2017-03-19 DIAGNOSIS — C7951 Secondary malignant neoplasm of bone: Secondary | ICD-10-CM | POA: Diagnosis not present

## 2017-03-19 DIAGNOSIS — I5031 Acute diastolic (congestive) heart failure: Secondary | ICD-10-CM | POA: Diagnosis not present

## 2017-03-19 DIAGNOSIS — Z515 Encounter for palliative care: Secondary | ICD-10-CM

## 2017-03-19 DIAGNOSIS — R0989 Other specified symptoms and signs involving the circulatory and respiratory systems: Secondary | ICD-10-CM | POA: Diagnosis not present

## 2017-03-19 DIAGNOSIS — I82409 Acute embolism and thrombosis of unspecified deep veins of unspecified lower extremity: Secondary | ICD-10-CM

## 2017-03-19 DIAGNOSIS — G8929 Other chronic pain: Secondary | ICD-10-CM | POA: Diagnosis not present

## 2017-03-19 DIAGNOSIS — C799 Secondary malignant neoplasm of unspecified site: Secondary | ICD-10-CM | POA: Diagnosis not present

## 2017-03-19 DIAGNOSIS — Z87442 Personal history of urinary calculi: Secondary | ICD-10-CM

## 2017-03-19 DIAGNOSIS — Z7984 Long term (current) use of oral hypoglycemic drugs: Secondary | ICD-10-CM

## 2017-03-19 DIAGNOSIS — C7931 Secondary malignant neoplasm of brain: Secondary | ICD-10-CM | POA: Diagnosis not present

## 2017-03-19 DIAGNOSIS — K219 Gastro-esophageal reflux disease without esophagitis: Secondary | ICD-10-CM | POA: Diagnosis not present

## 2017-03-19 DIAGNOSIS — Z86718 Personal history of other venous thrombosis and embolism: Secondary | ICD-10-CM

## 2017-03-19 DIAGNOSIS — Z86010 Personal history of colonic polyps: Secondary | ICD-10-CM

## 2017-03-19 DIAGNOSIS — M059 Rheumatoid arthritis with rheumatoid factor, unspecified: Secondary | ICD-10-CM | POA: Diagnosis present

## 2017-03-19 DIAGNOSIS — M6281 Muscle weakness (generalized): Secondary | ICD-10-CM | POA: Diagnosis not present

## 2017-03-19 DIAGNOSIS — R52 Pain, unspecified: Secondary | ICD-10-CM | POA: Diagnosis not present

## 2017-03-19 DIAGNOSIS — Z853 Personal history of malignant neoplasm of breast: Secondary | ICD-10-CM

## 2017-03-19 DIAGNOSIS — I340 Nonrheumatic mitral (valve) insufficiency: Secondary | ICD-10-CM | POA: Diagnosis not present

## 2017-03-19 DIAGNOSIS — I6523 Occlusion and stenosis of bilateral carotid arteries: Secondary | ICD-10-CM | POA: Diagnosis present

## 2017-03-19 DIAGNOSIS — Z882 Allergy status to sulfonamides status: Secondary | ICD-10-CM

## 2017-03-19 DIAGNOSIS — Z8679 Personal history of other diseases of the circulatory system: Secondary | ICD-10-CM

## 2017-03-19 DIAGNOSIS — Z7901 Long term (current) use of anticoagulants: Secondary | ICD-10-CM

## 2017-03-19 DIAGNOSIS — Z91040 Latex allergy status: Secondary | ICD-10-CM

## 2017-03-19 DIAGNOSIS — Z96651 Presence of right artificial knee joint: Secondary | ICD-10-CM | POA: Diagnosis present

## 2017-03-19 DIAGNOSIS — C7801 Secondary malignant neoplasm of right lung: Secondary | ICD-10-CM | POA: Diagnosis present

## 2017-03-19 DIAGNOSIS — I493 Ventricular premature depolarization: Secondary | ICD-10-CM | POA: Diagnosis present

## 2017-03-19 DIAGNOSIS — R279 Unspecified lack of coordination: Secondary | ICD-10-CM | POA: Diagnosis not present

## 2017-03-19 DIAGNOSIS — Z885 Allergy status to narcotic agent status: Secondary | ICD-10-CM

## 2017-03-19 DIAGNOSIS — M1711 Unilateral primary osteoarthritis, right knee: Secondary | ICD-10-CM | POA: Diagnosis present

## 2017-03-19 DIAGNOSIS — Z888 Allergy status to other drugs, medicaments and biological substances status: Secondary | ICD-10-CM

## 2017-03-19 DIAGNOSIS — E118 Type 2 diabetes mellitus with unspecified complications: Secondary | ICD-10-CM | POA: Diagnosis not present

## 2017-03-19 DIAGNOSIS — Z87891 Personal history of nicotine dependence: Secondary | ICD-10-CM

## 2017-03-19 DIAGNOSIS — Z85841 Personal history of malignant neoplasm of brain: Secondary | ICD-10-CM

## 2017-03-19 DIAGNOSIS — D72829 Elevated white blood cell count, unspecified: Secondary | ICD-10-CM | POA: Diagnosis present

## 2017-03-19 DIAGNOSIS — Z8249 Family history of ischemic heart disease and other diseases of the circulatory system: Secondary | ICD-10-CM

## 2017-03-19 DIAGNOSIS — C3401 Malignant neoplasm of right main bronchus: Secondary | ICD-10-CM | POA: Diagnosis not present

## 2017-03-19 DIAGNOSIS — Z79899 Other long term (current) drug therapy: Secondary | ICD-10-CM

## 2017-03-19 DIAGNOSIS — Z8541 Personal history of malignant neoplasm of cervix uteri: Secondary | ICD-10-CM

## 2017-03-19 DIAGNOSIS — Z9109 Other allergy status, other than to drugs and biological substances: Secondary | ICD-10-CM

## 2017-03-19 DIAGNOSIS — C7989 Secondary malignant neoplasm of other specified sites: Secondary | ICD-10-CM | POA: Diagnosis not present

## 2017-03-19 DIAGNOSIS — Z886 Allergy status to analgesic agent status: Secondary | ICD-10-CM

## 2017-03-19 LAB — CBC WITH DIFFERENTIAL/PLATELET
Basophils Absolute: 0 10*3/uL (ref 0.0–0.1)
Basophils Relative: 0 %
Eosinophils Absolute: 0.2 10*3/uL (ref 0.0–0.7)
Eosinophils Relative: 1 %
HEMATOCRIT: 33.5 % — AB (ref 36.0–46.0)
HEMOGLOBIN: 9.4 g/dL — AB (ref 12.0–15.0)
LYMPHS ABS: 1.2 10*3/uL (ref 0.7–4.0)
LYMPHS PCT: 7 %
MCH: 21.9 pg — AB (ref 26.0–34.0)
MCHC: 28.1 g/dL — ABNORMAL LOW (ref 30.0–36.0)
MCV: 78.1 fL (ref 78.0–100.0)
Monocytes Absolute: 1.2 10*3/uL — ABNORMAL HIGH (ref 0.1–1.0)
Monocytes Relative: 7 %
Neutro Abs: 14.1 10*3/uL — ABNORMAL HIGH (ref 1.7–7.7)
Neutrophils Relative %: 85 %
Platelets: 217 10*3/uL (ref 150–400)
RBC: 4.29 MIL/uL (ref 3.87–5.11)
RDW: 18.6 % — ABNORMAL HIGH (ref 11.5–15.5)
WBC: 16.6 10*3/uL — AB (ref 4.0–10.5)

## 2017-03-19 LAB — BASIC METABOLIC PANEL
ANION GAP: 14 (ref 5–15)
BUN: 18 mg/dL (ref 6–20)
CHLORIDE: 99 mmol/L — AB (ref 101–111)
CO2: 24 mmol/L (ref 22–32)
Calcium: 9.4 mg/dL (ref 8.9–10.3)
Creatinine, Ser: 0.78 mg/dL (ref 0.44–1.00)
GFR calc non Af Amer: >60 mL/min (ref >60)
GLUCOSE: 141 mg/dL — AB (ref 65–99)
Potassium: 3.7 mmol/L (ref 3.5–5.1)
Sodium: 137 mmol/L (ref 135–145)

## 2017-03-19 LAB — MAGNESIUM: Magnesium: 1.7 mg/dL (ref 1.7–2.4)

## 2017-03-19 LAB — TROPONIN I
TROPONIN I: 0.1 ng/mL — AB (ref <0.03)
Troponin I: 0.1 ng/mL (ref <0.03)

## 2017-03-19 LAB — BRAIN NATRIURETIC PEPTIDE: B Natriuretic Peptide: 251 pg/mL — ABNORMAL HIGH (ref 0.0–100.0)

## 2017-03-19 LAB — TSH: TSH: 0.225 u[IU]/mL — AB (ref 0.350–4.500)

## 2017-03-19 LAB — GLUCOSE, CAPILLARY: Glucose-Capillary: 145 mg/dL — ABNORMAL HIGH (ref 65–99)

## 2017-03-19 MED ORDER — AMIODARONE LOAD VIA INFUSION
150.0000 mg | Freq: Once | INTRAVENOUS | Status: AC
  Administered 2017-03-19: 150 mg via INTRAVENOUS

## 2017-03-19 MED ORDER — SODIUM CHLORIDE 0.9 % IV SOLN: 250.0000 mL | INTRAVENOUS | Status: DC | PRN

## 2017-03-19 MED ORDER — AMIODARONE HCL IN DEXTROSE 360-4.14 MG/200ML-% IV SOLN
60.0000 mg/h | INTRAVENOUS | Status: DC
Start: 2017-03-19 — End: 2017-03-20
  Administered 2017-03-19 (×2): 60 mg/h via INTRAVENOUS
  Filled 2017-03-19: qty 200

## 2017-03-19 MED ORDER — OFF THE BEAT BOOK
Freq: Once | Status: AC
  Administered 2017-03-20: 10:00:00
  Filled 2017-03-19: qty 1

## 2017-03-19 MED ORDER — FUROSEMIDE 10 MG/ML IJ SOLN
40.0000 mg | Freq: Once | INTRAMUSCULAR | Status: AC
  Administered 2017-03-19: 40 mg via INTRAVENOUS
  Filled 2017-03-19: qty 4

## 2017-03-19 MED ORDER — SODIUM CHLORIDE 0.9% FLUSH
3.0000 mL | Freq: Two times a day (BID) | INTRAVENOUS | Status: DC
  Administered 2017-03-20 – 2017-03-22 (×6): 3 mL via INTRAVENOUS

## 2017-03-19 MED ORDER — METOPROLOL TARTRATE 5 MG/5ML IV SOLN
5.0000 mg | Freq: Once | INTRAVENOUS | Status: AC
Start: 2017-03-19 — End: 2017-03-19
  Administered 2017-03-19: 5 mg via INTRAVENOUS
  Filled 2017-03-19: qty 5

## 2017-03-19 MED ORDER — SODIUM CHLORIDE 0.9% FLUSH: 3.0000 mL | INTRAVENOUS | Status: DC | PRN

## 2017-03-19 MED ORDER — MORPHINE SULFATE (CONCENTRATE) 10 MG/0.5ML PO SOLN: 15.0000 mg | ORAL | Status: DC | PRN

## 2017-03-19 MED ORDER — MORPHINE SULFATE ER 30 MG PO TBCR
30.0000 mg | EXTENDED_RELEASE_TABLET | Freq: Two times a day (BID) | ORAL | Status: DC
  Administered 2017-03-19 – 2017-03-23 (×8): 30 mg via ORAL
  Filled 2017-03-19 (×2): qty 1
  Filled 2017-03-19 (×3): qty 2
  Filled 2017-03-19 (×2): qty 1
  Filled 2017-03-19: qty 2

## 2017-03-19 MED ORDER — AMIODARONE HCL IN DEXTROSE 360-4.14 MG/200ML-% IV SOLN
30.0000 mg/h | INTRAVENOUS | Status: DC
  Administered 2017-03-20 (×2): 30 mg/h via INTRAVENOUS
  Filled 2017-03-19: qty 200

## 2017-03-19 MED ORDER — INSULIN ASPART 100 UNIT/ML ~~LOC~~ SOLN
0.0000 [IU] | Freq: Three times a day (TID) | SUBCUTANEOUS | Status: DC
  Administered 2017-03-20 – 2017-03-22 (×2): 2 [IU] via SUBCUTANEOUS

## 2017-03-19 MED ORDER — AMIODARONE HCL IN DEXTROSE 360-4.14 MG/200ML-% IV SOLN
INTRAVENOUS | Status: AC
  Administered 2017-03-19: 60 mg/h via INTRAVENOUS
  Filled 2017-03-19: qty 200

## 2017-03-19 NOTE — H&P (Addendum)
History and Physical    [name] MEQ:[id number] DOB: 14-Dec-1950 DOA: 03/19/2017  PCP: Jani Gravel, MD   Patient coming from: Home  Chief Complaint: Tachycardia with irregularity  HPI: [name] is a 67 y.o. female with medical history significant for widely metastatic cancer-currently on palliative care for pain management, prior DVTs on Xarelto, diabetes, hypertension, RA, and depression, who was recently discharged from [facility] long hospital after being admitted for pain control.  She is being visited by her hospice physician who noted that she was quite tachycardic with an irregular heartbeat at home.  The patient was otherwise asymptomatic and denied any chest pain, palpitations, dyspnea, nausea, vomiting, diaphoresis or other symptomatology.  Apparently, this is not noted upon discharge yesterday.   ED Course: Atrial fibrillation with RVR noted on EKG and chest x-ray demonstrates mild interstitial edema and cardiomegaly.  Lab work demonstrated BNP 251 and troponin of 0.10.  She remains asymptomatic and was given metoprolol push with transient improvement in tachycardia, but this has elevated once again.  ED physician Dr. Thurnell Garbe has spoken with Dr. Sung Amabile at [facility] who recommends admitting here and placing on amiodarone drip for rate control with cardiology evaluation in a.m.  She will also be given 1 dose of IV Lasix while here.  She continues to remain asymptomatic.  Review of Systems: As per HPI otherwise 10 point review of systems negative.   Past Medical History:  Diagnosis Date  . Anemia   . Angina   . Arthritis    Rheumatoid  . Brain cancer ([place])   . Breast cancer ([place])   . Cancer ([place])    left breast, cervical cancer  . Colon polyps   . Constipation   . Diabetes mellitus without complication ([place])   . Diarrhea   . Dysrhythmia   . GERD (gastroesophageal reflux disease)   . Headache   . Heart murmur   . History of kidney stones   . Hypercholesterolemia     . Hypertension   . Lung cancer ([place])   . Numbness and tingling in right hand   . Numbness and tingling of both feet   . Peripheral vascular disease ([place])    cerebrel aneurysm /sp coil  . Pneumonia   . PONV (postoperative nausea and vomiting)   . Raynaud's syndrome   . Rheumatoid arthritis with rheumatoid factor (HCC)   . Right knee DJD   . Stroke [facility])    jan 2012  . VTE (venous thromboembolism)     Past Surgical History:  Procedure Laterality Date  . ABDOMINAL HYSTERECTOMY  1977  . APPENDECTOMY  1964  . BILATERAL OOPHORECTOMY    . CHOLECYSTECTOMY    . COLONOSCOPY  12/09/2010   Procedure: COLONOSCOPY;  Surgeon: Rogene Houston, MD;  [facility];  Service: Endoscopy;  Laterality: N/A;  . COLONOSCOPY N/A 06/26/2015   Procedure: COLONOSCOPY;  Surgeon: Rogene Houston, MD;  [facility];  Service: Endoscopy;  Laterality: N/A;  1:05-moved to [address] to notify pt  . ESOPHAGOGASTRODUODENOSCOPY N/A 06/26/2015   Procedure: ESOPHAGOGASTRODUODENOSCOPY (EGD);  Surgeon: Rogene Houston, MD;  [facility];  Service: Endoscopy;  Laterality: N/A;  . EYE SURGERY     cataract surgery bilat   . GIVENS CAPSULE STUDY N/A 08/20/2015   Procedure: GIVENS CAPSULE STUDY;  Surgeon: Rogene Houston, MD;  [facility];  Service: Endoscopy;  Laterality: N/A;  730  . IR FLUORO GUIDE PORT INSERTION RIGHT  03/08/2017  . IR US GUIDE VASC ACCESS RIGHT  03/08/2017  . left breast lumpectomy  2004  . left knee arthroscopy    . Right brain aneurysm  04/2010   Coiled   . right wrist surgery  1999   synevectomy  . TOTAL KNEE ARTHROPLASTY Right 02/25/2013   Procedure: TOTAL KNEE ARTHROPLASTY;  Surgeon: Lorn Junes, MD;  Location: [facility];  Service: Orthopedics;  Laterality: Right;     reports that she quit smoking about 33 years ago. She has a 30.00 pack-year smoking history. she has never used smokeless tobacco. She reports that she does not drink alcohol or use  drugs.  Allergies  Allergen Reactions  . Aurothioglucose [Solganal] Other (See Comments)    REACTION: VASCULITIS   . Feldene [Piroxicam] Anaphylaxis and Other (See Comments)    SKIN BLISTERS  . Hydralazine Other (See Comments)    REACTION: Chest pains and headaches: IV only  . Imdur [Isosorbide Mononitrate] Other (See Comments)    REACTION: PATIENT IS UNABLE TO WALK OR FUNCTION  . Imuran [Azathioprine] Other (See Comments)    Could not walk or function  . Methotrexate Derivatives Other (See Comments)    REACTION: VASCULITIS  . Calcium Channel Blockers Palpitations  . Sulfa Antibiotics Diarrhea and Nausea And Vomiting  . Thimerosal Other (See Comments)    SKIN BLISTERED  . Vioxx [Rofecoxib] Other (See Comments)    REACTION: CAUSED MORE JOINT PAIN  . Arava [Leflunomide]     UNSPECIFIED REACTION   . Asacol [Mesalamine]     UNSPECIFIED REACTION   . Iohexol Other (See Comments)    PT STATES SHE WAS TOLD NEVER TO HAVE CT CONTRAST AGAIN.  SHE HAD SOME KIND OF REACTION BUT DOESNT REMEBER WHAT HAPPENED   . Hydrocodone Itching  . Latex Rash  . Neomycin Rash  . Nickel Rash  . Nsaids Other (See Comments)    GI UPSET , CAN TOLERATE SOME NSAIDS  . Percocet [Oxycodone-Acetaminophen] Other (See Comments)    INSOMNIA    Family History  Problem Relation Age of Onset  . Hypertension Father   . Heart attack Father   . Heart disease Maternal Uncle   . Cancer Maternal Aunt        breast  . Cancer Other        kidney  . Cancer Cousin   . Colon cancer Neg Hx     Prior to Admission medications   Medication Sig Start Date End Date Taking? Authorizing Provider  acetaminophen (TYLENOL) 325 MG tablet Take 2 tablets (650 mg total) by mouth every 6 (six) hours as needed for mild pain (or Fever >/= 101). 03/18/17  Yes Robbie Lis, MD  allopurinol (ZYLOPRIM) 300 MG tablet Take 300 mg by mouth daily.    Yes [provider]  atorvastatin (LIPITOR) 20 MG tablet Take 20 mg by mouth at  bedtime.   Yes [provider]  Calcium Carbonate-Vit D-Min (CALTRATE 600+D PLUS) 600-400 MG-UNIT per tablet Chew 1 tablet by mouth 2 (two) times daily.     Yes [provider]  Cholecalciferol (VITAMIN D) 2000 UNITS tablet Take 2,000 Units by mouth daily.    Yes [provider]  DULoxetine (CYMBALTA) 60 MG capsule Take 60 mg by mouth daily.   Yes [provider]  FIBER SELECT GUMMIES PO Take 2 each by mouth 2 (two) times daily.    Yes [provider]  folic acid (FOLVITE) 1 MG tablet Take 1 tablet (  1 mg total) by mouth daily. Start 5-7 days before Alimta chemotherapy. Continue until 21 days after Alimta completed. 02/27/17  Yes Ardath Sax, MD  hydroxychloroquine (PLAQUENIL) 200 MG tablet Take 200 mg by mouth daily.    Yes [provider]  ipratropium (ATROVENT) 0.06 % nasal spray Place 2 sprays into both nostrils 2 (two) times daily as needed for rhinitis.   Yes [provider]  lidocaine-prilocaine (EMLA) cream Apply to affected area once 02/27/17  Yes Perlov, Marinell Blight, MD  LORazepam (ATIVAN) 0.5 MG tablet Take 1 tablet (0.5 mg total) by mouth every 6 (six) hours as needed (Nausea or vomiting). 02/27/17  Yes Ardath Sax, MD  MAGNESIUM OXIDE PO Take 500 mg by mouth daily.    Yes [provider]  metFORMIN (GLUCOPHAGE-XR) 500 MG 24 hr tablet Take 500 mg by mouth 2 (two) times a week. No specific days   Yes [provider]  methylPREDNISolone (MEDROL) 8 MG tablet Take 8 mg by mouth daily.   Yes [provider]  morphine (MS CONTIN) 60 MG 12 hr tablet Take 1 tablet (60 mg total) by mouth 3 (three) times daily. Patient taking differently: Take 30 mg by mouth 3 (three) times daily.  03/18/17  Yes Robbie Lis, MD  Morphine Sulfate (MORPHINE CONCENTRATE) 10 MG/0.5ML SOLN concentrated solution Take 0.75 mLs (15 mg total) by mouth every 3 (three) hours as needed for moderate pain. 03/18/17  Yes Robbie Lis,  MD  nebivolol (BYSTOLIC) 5 MG tablet Take 5 mg by mouth every evening.    Yes [provider]  omeprazole (PRILOSEC) 40 MG capsule TAKE (1) CAPSULE BY MOUTH ONCE DAILY. Patient taking differently: TAKE 40 MG BY MOUTH ONCE DAILY. 07/21/14  Yes Rehman, Mechele Dawley, MD  ondansetron (ZOFRAN) 8 MG tablet Take 1 tablet (8 mg total) by mouth 2 (two) times daily as needed for refractory nausea / vomiting. Start on day 3 after chemo. 02/27/17  Yes Perlov, Marinell Blight, MD  Polyethyl Glycol-Propyl Glycol (SYSTANE) 0.4-0.3 % GEL Apply 1 application to eye daily as needed (for dry eyes).    Yes [provider]  prochlorperazine (COMPAZINE) 10 MG tablet Take 1 tablet (10 mg total) by mouth every 6 (six) hours as needed (Nausea or vomiting). 02/27/17  Yes Perlov, Marinell Blight, MD  XARELTO 20 MG TABS tablet Take 1 tablet by mouth daily. 02/27/17  Yes [provider]    Physical Exam: Vitals:   03/19/17 1800 03/19/17 1817 03/19/17 1830 03/19/17 1900  BP: 132/72 132/72 (!) 127/55 (!) 159/65  Pulse:  (!) 123    Resp: (!) 25 18 16 19   Temp:      TempSrc:      SpO2: 91% 92% 94% 94%  Weight:      Height:        Constitutional: NAD, calm, comfortable Vitals:   03/19/17 1800 03/19/17 1817 03/19/17 1830 03/19/17 1900  BP: 132/72 132/72 (!) 127/55 (!) 159/65  Pulse:  (!) 123    Resp: (!) 25 18 16 19   Temp:      TempSrc:      SpO2: 91% 92% 94% 94%  Weight:      Height:       Eyes: lids and conjunctivae normal ENMT: Mucous membranes are moist.  Neck: normal, supple Respiratory: clear to auscultation bilaterally. Normal respiratory effort. No accessory muscle use.  Cardiovascular: irregular rate and rhythm with tachycardia, no murmurs. No extremity edema. Abdomen: no tenderness,  no distention. Bowel sounds positive.  Musculoskeletal:  No joint deformity upper and lower extremities.   Skin: no rashes, lesions, ulcers.  Psychiatric: Normal judgment and insight. Alert and oriented x 3.  Normal mood.   Labs on Admission: I have personally reviewed following labs and imaging studies  CBC: Recent Labs  Lab 03/14/17 1044 03/15/17 2258 03/16/17 0431 03/17/17 0440 03/18/17 0500 03/19/17 1627  WBC 18.9* 15.6* 14.4* 14.0* 11.8* 16.6*  NEUTROABS 14.6  --   --   --   --  14.1*  HGB 8.4* 8.9* 8.8* 8.1* 8.5* 9.4*  HCT 31.0* 31.7* 31.3* 28.3* 31.4* 33.5*  MCV 79.5 77.5* 77.9* 78.0 79.9 78.1  PLT 282 239 253 220 250 416   Basic Metabolic Panel: Recent Labs  Lab 03/15/17 2258 03/16/17 0416 03/17/17 0440 03/18/17 0500 03/19/17 1627  NA 141 141 143 143 137  K 3.7 3.8 3.7 4.0 3.7  CL 105 105 109 107 99*  CO2 28 27 27 29 24   GLUCOSE 203* 145* 98 123* 141*  BUN 16 16 18 18 18   CREATININE 0.58 0.60 0.62 0.58 0.78  CALCIUM 9.1 8.8* 8.7* 9.4 9.4  MG  --   --   --   --  1.7   GFR: Estimated Creatinine Clearance: 66.7 mL/min (by C-G formula based on SCr of 0.78 mg/dL). Liver Function Tests: Recent Labs  Lab 03/14/17 1044 03/16/17 0416  AST 28 23  ALT 81* 58*  ALKPHOS 259* 271*  BILITOT 0.4 0.7  PROT 6.0* 6.2*  ALBUMIN 3.0* 3.1*   No results for input(s): LIPASE, AMYLASE in the last 168 hours. No results for input(s): AMMONIA in the last 168 hours. Coagulation Profile: Recent Labs  Lab 03/16/17 0416  INR 1.47   Cardiac Enzymes: Recent Labs  Lab 03/19/17 1627  TROPONINI 0.10*   BNP (last 3 results) No results for input(s): PROBNP in the last 8760 hours. HbA1C: No results for input(s): HGBA1C in the last 72 hours. CBG: Recent Labs  Lab 03/17/17 1847 03/17/17 2000 03/17/17 2356 03/18/17 0406 03/18/17 0733  GLUCAP 162* 145* 146* 129* 102*   Lipid Profile: No results for input(s): CHOL, HDL, LDLCALC, TRIG, CHOLHDL, LDLDIRECT in the last 72 hours. Thyroid Function Tests: No results for input(s): TSH, T4TOTAL, FREET4, T3FREE, THYROIDAB in the last 72 hours. Anemia Panel: No results for input(s): VITAMINB12, FOLATE, FERRITIN, TIBC, IRON, RETICCTPCT  in the last 72 hours. Urine analysis:    Component Value Date/Time   COLORURINE YELLOW 03/15/2017 2231   APPEARANCEUR CLEAR 03/15/2017 2231   LABSPEC 1.010 03/15/2017 2231   PHURINE 7.0 03/15/2017 2231   GLUCOSEU NEGATIVE 03/15/2017 2231   HGBUR NEGATIVE 03/15/2017 2231   BILIRUBINUR NEGATIVE 03/15/2017 2231   KETONESUR NEGATIVE 03/15/2017 2231   PROTEINUR NEGATIVE 03/15/2017 2231   UROBILINOGEN 0.2 02/25/2013 0913   NITRITE NEGATIVE 03/15/2017 2231   LEUKOCYTESUR TRACE (A) 03/15/2017 2231    Radiological Exams on Admission: Dg Chest Port 1 View  Result Date: 03/19/2017 CLINICAL DATA:  Heart palpitations. Metastatic cancer. Previously treated breast cancer. EXAM: PORTABLE CHEST 1 VIEW COMPARISON:  01/18/2017.  PET-CT dated 02/06/2017. FINDINGS: Poor inspiration. Borderline enlarged cardiac silhouette. Increased prominence of the pulmonary vasculature and interstitial markings. No visible pleural fluid. Left axillary surgical clips. Right jugular porta catheter tip in the superior vena cava. Mottled appearance of the right humeral neck. No abnormal activity at that location on the previous PET-CT. IMPRESSION: 1. Poor inspiration with interval borderline cardiomegaly, pulmonary vascular congestion and probable  mild interstitial pulmonary edema. 2. Possible interval right humeral neck metastasis. Electronically Signed   By: Claudie Revering M.D.   On: 03/19/2017 17:19    EKG: Independently reviewed. Afib with RVR noted.  Assessment/Plan Principal Problem:   Atrial fibrillation with RVR (HCC) Active Problems:   RA (rheumatoid arthritis) ([place])   Metastatic cancer ([place])   Palliative care by specialist   DNR (do not resuscitate)   Diabetes ([place])   DVT (deep venous thrombosis) ([place])     1. Atrial fibrillation with RVR-appears new onset.  Initiate amiodarone bolus with drip per cardiology recommendations and maintain on Xarelto for anticoagulation.  I will obtain TSH level as well as 2D  echocardiogram for further evaluation with cardiology consultation in a.m.  Monitor for now in stepdown unit and trend troponin. 2. Metastatic malignancy with chronic pain.  Maintain on chronic pain medications and monitor for control. 3. RA.  Continue on Plaquenil and steroids. 4. Type 2 diabetes.  Continue on home hypoglycemics with SSI and carb modified diet. 5. Depression.  Continue on Cymbalta.  DVT prophylaxis: On Xarelto Code Status: DNR Family Communication: Spouse at bedside Disposition Plan:Home once stable Consults called:Cardiology to see in AM Admission status: Inpatient, SDU   Riel Hirschman D Manuella Ghazi DO [facility] Pager [phone]  If 7PM-7AM, please contact night-coverage [email or web address] Password [facility]  03/19/2017, 7:18 PM

## 2017-03-19 NOTE — ED Triage Notes (Signed)
Pt brought into ED today by husband with c/o irregular heart beat. Pt was seen by Dr. Sonny Dandy at her home today as a follow up visit from recent hospitalization. He found her heart rate to be high and irregular so she was sent to ED for further evaluation.

## 2017-03-19 NOTE — ED Provider Notes (Signed)
[facility] EMERGENCY DEPARTMENT Provider Note   CSN: [id number] Arrival date & time: 03/19/17  1603     History   Chief Complaint Chief Complaint  Patient presents with  . Irregular Heartbeat    HPI [name] is a 67 y.o. female.  HPI  Pt was seen at 1615.  Per pt and her family, c/o unknown onset and persistence of constant "irregular HR" that was noted today. Pt's Hospice MD came to her house today, and during his evaluation, it was noted pt's HR was irregular and fast. Pt was then sent to the ED for further evaluation for possible new onset atrial fibrillation. Denies CP/SOB, no cough, no abd pain, no N/V/D, no fevers.   Past Medical History:  Diagnosis Date  . Anemia   . Angina   . Arthritis    Rheumatoid  . Brain cancer ([place])   . Breast cancer ([place])   . Cancer ([place])    left breast, cervical cancer  . Colon polyps   . Constipation   . Diabetes mellitus without complication ([place])   . Diarrhea   . Dysrhythmia   . GERD (gastroesophageal reflux disease)   . Headache   . Heart murmur   . History of kidney stones   . Hypercholesterolemia   . Hypertension   . Lung cancer ([place])   . Numbness and tingling in right hand   . Numbness and tingling of both feet   . Peripheral vascular disease ([place])    cerebrel aneurysm /sp coil  . Pneumonia   . PONV (postoperative nausea and vomiting)   . Raynaud's syndrome   . Rheumatoid arthritis with rheumatoid factor (HCC)   . Right knee DJD   . Stroke [facility])    jan 2012  . VTE (venous thromboembolism)     Patient Active Problem List   Diagnosis Date Noted  . Pain 03/15/2017  . Bone metastasis ([place]) 03/13/2017  . Palliative care by specialist   . DNR (do not resuscitate)   . Primary cancer of left upper lobe of lung ([place]) 02/27/2017  . Secondary malignant neoplasm of brain and spinal cord ([place]) 02/15/2017  . Venous insufficiency   . Venous intermittent claudication   . Diverticulitis of colon (without mention of  hemorrhage)(562.11) 08/26/2013  . Postoperative anemia due to acute blood loss 02/26/2013  . Anemia, unspecified 02/26/2013  . DJD (degenerative joint disease) of knee 02/25/2013  . PONV (postoperative nausea and vomiting)   . Stroke ([place])   . Hypercholesterolemia   . Metastatic cancer ([place])   . Colon polyps   . Arthritis   . Rheumatoid arthritis with rheumatoid factor (HCC)   . Right knee DJD   . Asthenia 08/14/2012  . Cerebral aneurysm, nonruptured 08/14/2012  . TIA (transient ischemic attack) 08/14/2012  . RA (rheumatoid arthritis) 10/27/2011  . High cholesterol 10/27/2011  . Hypertension 10/27/2011  . GERD (gastroesophageal reflux disease) 10/27/2011    Past Surgical History:  Procedure Laterality Date  . ABDOMINAL HYSTERECTOMY  1977  . APPENDECTOMY  1964  . BILATERAL OOPHORECTOMY    . CHOLECYSTECTOMY    . COLONOSCOPY  12/09/2010   Procedure: COLONOSCOPY;  Surgeon: Rogene Houston, MD;  Location: [facility] ENDO SUITE;  Service: Endoscopy;  Laterality: N/A;  . COLONOSCOPY N/A 06/26/2015   Procedure: COLONOSCOPY;  Surgeon: Rogene Houston, MD;  Location: [facility] ENDO SUITE;  Service: Endoscopy;  Laterality: N/A;  1:05-moved to [address] to notify pt  . ESOPHAGOGASTRODUODENOSCOPY N/A 06/26/2015  Procedure: ESOPHAGOGASTRODUODENOSCOPY (EGD);  Surgeon: Rogene Houston, MD;  Location: [facility] ENDO SUITE;  Service: Endoscopy;  Laterality: N/A;  . EYE SURGERY     cataract surgery bilat   . GIVENS CAPSULE STUDY N/A 08/20/2015   Procedure: GIVENS CAPSULE STUDY;  Surgeon: Rogene Houston, MD;  Location: [facility] ENDO SUITE;  Service: Endoscopy;  Laterality: N/A;  730  . IR FLUORO GUIDE PORT INSERTION RIGHT  03/08/2017  . IR US GUIDE VASC ACCESS RIGHT  03/08/2017  . left breast lumpectomy  2004  . left knee arthroscopy    . Right brain aneurysm  04/2010   Coiled   . right wrist surgery  1999   synevectomy  . TOTAL KNEE ARTHROPLASTY Right 02/25/2013   Procedure: TOTAL KNEE ARTHROPLASTY;  Surgeon: Lorn Junes, MD;  Location: [place];  Service: Orthopedics;  Laterality: Right;    OB History    Gravida Para Term Preterm AB Living             0   SAB TAB Ectopic Multiple Live Births                   Home Medications    Prior to Admission medications   Medication Sig Start Date End Date Taking? Authorizing Provider  acetaminophen (TYLENOL) 325 MG tablet Take 2 tablets (650 mg total) by mouth every 6 (six) hours as needed for mild pain (or Fever >/= 101). 03/18/17   Robbie Lis, MD  allopurinol (ZYLOPRIM) 300 MG tablet Take 300 mg by mouth daily.     [provider]  atorvastatin (LIPITOR) 20 MG tablet Take 20 mg by mouth at bedtime.    [provider]  Calcium Carbonate-Vit D-Min (CALTRATE 600+D PLUS) 600-400 MG-UNIT per tablet Chew 1 tablet by mouth 2 (two) times daily.      [provider]  Cholecalciferol (VITAMIN D) 2000 UNITS tablet Take 2,000 Units by mouth daily.     [provider]  DULoxetine (CYMBALTA) 60 MG capsule Take 60 mg by mouth daily.    [provider]  FIBER SELECT GUMMIES PO Take 2 each by mouth 2 (two) times daily.     [provider]  folic acid (FOLVITE) 1 MG tablet Take 1 tablet (1 mg total) by mouth daily. Start 5-7 days before Alimta chemotherapy. Continue until 21 days after Alimta completed. 02/27/17   Ardath Sax, MD  hydroxychloroquine (PLAQUENIL) 200 MG tablet Take 200 mg by mouth daily.     [provider]  ipratropium (ATROVENT) 0.06 % nasal spray Place 2 sprays into both nostrils 2 (two) times daily as needed for rhinitis.    [provider]  lidocaine-prilocaine (EMLA) cream Apply to affected area once 02/27/17   Perlov, Marinell Blight, MD  LORazepam (ATIVAN) 0.5 MG tablet Take 1 tablet (0.5 mg total) by mouth every 6 (six) hours as needed (Nausea or vomiting). 02/27/17   Ardath Sax, MD  MAGNESIUM OXIDE PO Take 500 mg by mouth daily.     [provider]  metFORMIN  (GLUCOPHAGE-XR) 500 MG 24 hr tablet Take 500 mg by mouth 2 (two) times a week. No specific days    [provider]  methylPREDNISolone (MEDROL) 8 MG tablet Take 8 mg by mouth daily.    [provider]  morphine (MS CONTIN) 60 MG 12 hr tablet Take 1 tablet (60 mg total) by mouth 3 (three) times daily. 03/18/17   Robbie Lis, MD  Morphine Sulfate (MORPHINE CONCENTRATE) 10 MG/0.5ML SOLN concentrated solution Take 0.75 mLs (15 mg total) by mouth every 3 (three) hours as needed for moderate pain. 03/18/17   Robbie Lis, MD  nebivolol (BYSTOLIC) 5 MG tablet Take 5 mg by mouth every evening.     [provider]  omeprazole (PRILOSEC) 40 MG capsule TAKE (1) CAPSULE BY MOUTH ONCE DAILY. Patient taking differently: TAKE 40 MG BY MOUTH ONCE DAILY. 07/21/14   Rehman, Mechele Dawley, MD  ondansetron (ZOFRAN) 8 MG tablet Take 1 tablet (8 mg total) by mouth 2 (two) times daily as needed for refractory nausea / vomiting. Start on day 3 after chemo. 02/27/17   Ardath Sax, MD  Polyethyl Glycol-Propyl Glycol (SYSTANE) 0.4-0.3 % GEL Apply 1 application to eye daily as needed (for dry eyes).     [provider]  prochlorperazine (COMPAZINE) 10 MG tablet Take 1 tablet (10 mg total) by mouth every 6 (six) hours as needed (Nausea or vomiting). 02/27/17   Perlov, Marinell Blight, MD  XARELTO 20 MG TABS tablet Take 1 tablet by mouth daily. 02/27/17   [provider]    Family History Family History  Problem Relation Age of Onset  . Hypertension Father   . Heart attack Father   . Heart disease Maternal Uncle   . Cancer Maternal Aunt        breast  . Cancer Other        kidney  . Cancer Cousin   . Colon cancer Neg Hx     Social History Social History   Tobacco Use  . Smoking status: Former Smoker    Packs/day: 2.00    Years: 15.00    Pack years: 30.00    Last attempt to quit: 02/20/1984    Years since quitting: 33.0  . Smokeless tobacco: Never Used  . Tobacco comment:  Quit 1986  Substance Use Topics  . Alcohol use: No  . Drug use: No     Allergies   Aurothioglucose [solganal]; Feldene [piroxicam]; Hydralazine; Imdur [isosorbide mononitrate]; Imuran [azathioprine]; Methotrexate derivatives; Calcium channel blockers; Sulfa antibiotics; Thimerosal; Vioxx [rofecoxib]; Arava [leflunomide]; Asacol [mesalamine]; Iohexol; Hydrocodone; Latex; Neomycin; Nickel; Nsaids; and Percocet [oxycodone-acetaminophen]   Review of Systems Review of Systems ROS: Statement: All systems negative except as marked or noted in the HPI; Constitutional: Negative for fever and chills. ; ; Eyes: Negative for eye pain, redness and discharge. ; ; ENMT: Negative for ear pain, hoarseness, nasal congestion, sinus pressure and sore throat. ; ; Cardiovascular: +fast/irregular HR. Negative for chest pain, diaphoresis, dyspnea and peripheral edema. ; ; Respiratory: Negative for cough, wheezing and stridor. ; ; Gastrointestinal: Negative for nausea, vomiting, diarrhea, abdominal pain, blood in stool, hematemesis, jaundice and rectal bleeding. . ; ; Genitourinary: Negative for dysuria, flank pain and hematuria. ; ; Musculoskeletal: Negative for back pain and neck pain. Negative for swelling and trauma.; ; Skin: Negative for pruritus, rash, abrasions, blisters, bruising and skin lesion.; ; Neuro: Negative for headache, lightheadedness and neck stiffness. Negative for weakness, altered level of consciousness, altered mental status, extremity weakness, paresthesias, involuntary movement, seizure and syncope.       Physical Exam Updated Vital Signs BP (!) 154/66 (BP Location: Right Arm)   Pulse (!) 145   Temp 98.8 F (37.1 C) (Oral)   Resp 19   Ht 5' 7.5" (1.715 m)   Wt 74.4 kg (164 lb)   SpO2 93%   BMI 25.31 kg/m    Patient Vitals for  the past 24 hrs:  BP Temp Temp src Pulse Resp SpO2 Height Weight  03/19/17 1817 132/72 - - (!) 123 18 92 % - -  03/19/17 1800 132/72 - - - (!) 25 91 % - -    03/19/17 1730 (!) 143/66 - - - (!) 23 (!) 88 % - -  03/19/17 1728 - - - - (!) 25 (!) 89 % - -  03/19/17 1715 - - - - (!) 23 (!) 89 % - -  03/19/17 1703 128/71 - - - (!) 21 (!) 89 % - -  03/19/17 1700 124/75 - - (!) 128 (!) 23 92 % - -  03/19/17 1630 137/66 - - (!) 141 (!) 24 94 % - -  03/19/17 1626 (!) 154/66 98.8 F (37.1 C) Oral (!) 145 19 93 % - -  03/19/17 1624 - - - - - - 5' 7.5" (1.715 m) 74.4 kg (164 lb)     Physical Exam 1620: Physical examination:  Nursing notes reviewed; Vital signs and O2 SAT reviewed;  Constitutional: Well developed, Well nourished, Well hydrated, In no acute distress; Head:  Normocephalic, atraumatic; Eyes: EOMI, PERRL, No scleral icterus; ENMT: Mouth and pharynx normal, Mucous membranes moist; Neck: Supple, Full range of motion, No lymphadenopathy; Cardiovascular: Irregular tachycardic rate and rhythm, No gallop; Respiratory: Breath sounds clear & equal bilaterally, No wheezes.  Speaking full sentences with ease, Normal respiratory effort/excursion; Chest: Nontender, Movement normal; Abdomen: Soft, Nontender, Nondistended, Normal bowel sounds; Genitourinary: No CVA tenderness; Extremities: Pulses normal, No tenderness, No edema, No calf edema or asymmetry.; Neuro: AA&Ox3, Major CN grossly intact.  Speech clear. Generalized weakness without apparent gross focal motor deficits in extremities.; Skin: Color normal, Warm, Dry.   ED Treatments / Results  Labs (all labs ordered are listed, but only abnormal results are displayed)   EKG  EKG Interpretation  Date/Time:  Sunday March 19 2017 16:22:33 EST Ventricular Rate:  137 PR Interval:    QRS Duration: 78 QT Interval:  295 QTC Calculation: 446 R Axis:   -4 Text Interpretation:  Atrial fibrillation with rapid ventricular response Minimal ST depression, anterolateral leads When compared with ECG of 03/16/2017 Atrial fibrillation has replaced Normal sinus rhythm Confirmed by Francine Graven [phone]) on  03/19/2017 4:46:44 PM       Radiology   Procedures Procedures (including critical care time)  Medications Ordered in ED Medications  metoprolol tartrate (LOPRESSOR) injection 5 mg (not administered)     Initial Impression / Assessment and Plan / ED Course  I have reviewed the triage vital signs and the nursing notes.  Pertinent labs & imaging results that were available during my care of the patient were reviewed by me and considered in my medical decision making (see chart for details).  MDM Reviewed: previous chart, nursing note and vitals Reviewed previous: labs and ECG Interpretation: labs, ECG and x-ray Total time providing critical care: 30-74 minutes. This excludes time spent performing separately reportable procedures and services. Consults: admitting MD   CRITICAL CARE Performed by: Alfonzo Feller Total critical care time: 45 minutes Critical care time was exclusive of separately billable procedures and treating other patients. Critical care was necessary to treat or prevent imminent or life-threatening deterioration. Critical care was time spent personally by me on the following activities: development of treatment plan with patient and/or surrogate as well as nursing, discussions with consultants, evaluation of patient's response to treatment, examination of patient, obtaining history from patient or surrogate, ordering and performing treatments and  interventions, ordering and review of laboratory studies, ordering and review of radiographic studies, pulse oximetry and re-evaluation of patient's condition.   Results for orders placed or performed during the hospital encounter of 82/95/62  Basic metabolic panel  Result Value Ref Range   Sodium 137 135 - 145 mmol/L   Potassium 3.7 3.5 - 5.1 mmol/L   Chloride 99 (L) 101 - 111 mmol/L   CO2 24 22 - 32 mmol/L   Glucose, Bld 141 (H) 65 - 99 mg/dL   BUN 18 6 - 20 mg/dL   Creatinine, Ser 0.78 0.44 - 1.00 mg/dL    Calcium 9.4 8.9 - 10.3 mg/dL   GFR calc non Af Amer >60 >60 mL/min   GFR calc Af Amer >60 >60 mL/min   Anion gap 14 5 - 15  Troponin I  Result Value Ref Range   Troponin I 0.10 (HH) <0.03 ng/mL  CBC with Differential  Result Value Ref Range   WBC 16.6 (H) 4.0 - 10.5 K/uL   RBC 4.29 3.87 - 5.11 MIL/uL   Hemoglobin 9.4 (L) 12.0 - 15.0 g/dL   HCT 33.5 (L) 36.0 - 46.0 %   MCV 78.1 78.0 - 100.0 fL   MCH 21.9 (L) 26.0 - 34.0 pg   MCHC 28.1 (L) 30.0 - 36.0 g/dL   RDW 18.6 (H) 11.5 - 15.5 %   Platelets 217 150 - 400 K/uL   Neutrophils Relative % 85 %   Neutro Abs 14.1 (H) 1.7 - 7.7 K/uL   Lymphocytes Relative 7 %   Lymphs Abs 1.2 0.7 - 4.0 K/uL   Monocytes Relative 7 %   Monocytes Absolute 1.2 (H) 0.1 - 1.0 K/uL   Eosinophils Relative 1 %   Eosinophils Absolute 0.2 0.0 - 0.7 K/uL   Basophils Relative 0 %   Basophils Absolute 0.0 0.0 - 0.1 K/uL  Magnesium  Result Value Ref Range   Magnesium 1.7 1.7 - 2.4 mg/dL  Brain natriuretic peptide  Result Value Ref Range   B Natriuretic Peptide 251.0 (H) 0.0 - 100.0 pg/mL    Dg Chest Port 1 View Result Date: 03/19/2017 CLINICAL DATA:  Heart palpitations. Metastatic cancer. Previously treated breast cancer. EXAM: PORTABLE CHEST 1 VIEW COMPARISON:  01/18/2017.  PET-CT dated 02/06/2017. FINDINGS: Poor inspiration. Borderline enlarged cardiac silhouette. Increased prominence of the pulmonary vasculature and interstitial markings. No visible pleural fluid. Left axillary surgical clips. Right jugular porta catheter tip in the superior vena cava. Mottled appearance of the right humeral neck. No abnormal activity at that location on the previous PET-CT. IMPRESSION: 1. Poor inspiration with interval borderline cardiomegaly, pulmonary vascular congestion and probable mild interstitial pulmonary edema. 2. Possible interval right humeral neck metastasis. Electronically Signed   By: Claudie Revering M.D.   On: 03/19/2017 17:19    1840:  Pt with multiple drug  allergies and cross-reactions to meds. IV metoprolol given with transient improvement of HR into 110's, but O2 Sats decreased to 88-90% R/A.  HR now increasing again to 120-130's, with Sats increasing to 95% R/A on my re-exam. No resp distress, no wheezing. Pt already taking xarelto. T/C to [facility] Cards Dr. Haroldine Laws, case discussed, including:  HPI, pertinent PM/SHx, VS/PE, dx testing, ED course and treatment:   OK to use IV amiodarone bolus/gtt for rate control/conversion, IV lasix 40mg  for CHF, pt can stay at [facility] for Cards eval tomorrow.  1905:  T/C to [facility] Dr. Manuella Ghazi, case discussed, including:  HPI, pertinent PM/SHx, VS/PE, dx testing, ED course  and treatment:  Agreeable to admit.      Final Clinical Impressions(s) / ED Diagnoses   Final diagnoses:  None    ED Discharge Orders    None       Francine Graven, DO 03/21/17 1535

## 2017-03-19 NOTE — ED Notes (Signed)
CRITICAL VALUE ALERT  Critical Value:  Trop 0.10  Date & Time Notied:  1718, 03/19/17  Provider Notified: Dr. Thurnell Garbe  Orders Received/Actions taken: No new orders at this time.

## 2017-03-20 ENCOUNTER — Inpatient Hospital Stay (HOSPITAL_COMMUNITY): Payer: Medicare Other

## 2017-03-20 ENCOUNTER — Ambulatory Visit: Payer: Medicare Other | Admitting: Radiation Oncology

## 2017-03-20 ENCOUNTER — Ambulatory Visit
Admission: RE | Admit: 2017-03-20 | Discharge: 2017-03-20 | Disposition: A | Discharge status: Home / Self Care | Payer: Medicare Other | Source: Ambulatory Visit | Attending: Radiation Oncology | Admitting: Radiation Oncology

## 2017-03-20 DIAGNOSIS — C799 Secondary malignant neoplasm of unspecified site: Secondary | ICD-10-CM

## 2017-03-20 DIAGNOSIS — I340 Nonrheumatic mitral (valve) insufficiency: Secondary | ICD-10-CM

## 2017-03-20 DIAGNOSIS — M069 Rheumatoid arthritis, unspecified: Secondary | ICD-10-CM

## 2017-03-20 DIAGNOSIS — I82412 Acute embolism and thrombosis of left femoral vein: Secondary | ICD-10-CM

## 2017-03-20 DIAGNOSIS — G8929 Other chronic pain: Secondary | ICD-10-CM

## 2017-03-20 DIAGNOSIS — I5031 Acute diastolic (congestive) heart failure: Secondary | ICD-10-CM

## 2017-03-20 DIAGNOSIS — E876 Hypokalemia: Secondary | ICD-10-CM

## 2017-03-20 DIAGNOSIS — I4891 Unspecified atrial fibrillation: Principal | ICD-10-CM

## 2017-03-20 LAB — MRSA PCR SCREENING: MRSA BY PCR: NEGATIVE

## 2017-03-20 LAB — BASIC METABOLIC PANEL
Anion gap: 14 (ref 5–15)
BUN: 15 mg/dL (ref 6–20)
CO2: 26 mmol/L (ref 22–32)
CREATININE: 0.62 mg/dL (ref 0.44–1.00)
Calcium: 9 mg/dL (ref 8.9–10.3)
Chloride: 97 mmol/L — ABNORMAL LOW (ref 101–111)
GFR calc Af Amer: >60 mL/min (ref >60)
GLUCOSE: 124 mg/dL — AB (ref 65–99)
POTASSIUM: 2.9 mmol/L — AB (ref 3.5–5.1)
Sodium: 137 mmol/L (ref 135–145)

## 2017-03-20 LAB — CBC
HEMATOCRIT: 30.2 % — AB (ref 36.0–46.0)
Hemoglobin: 8.6 g/dL — ABNORMAL LOW (ref 12.0–15.0)
MCH: 21.8 pg — ABNORMAL LOW (ref 26.0–34.0)
MCHC: 28.5 g/dL — AB (ref 30.0–36.0)
MCV: 76.6 fL — ABNORMAL LOW (ref 78.0–100.0)
PLATELETS: 202 10*3/uL (ref 150–400)
RBC: 3.94 MIL/uL (ref 3.87–5.11)
RDW: 18.4 % — AB (ref 11.5–15.5)
WBC: 13.2 10*3/uL — ABNORMAL HIGH (ref 4.0–10.5)

## 2017-03-20 LAB — TROPONIN I
TROPONIN I: 0.11 ng/mL — AB (ref <0.03)
Troponin I: 0.11 ng/mL (ref <0.03)

## 2017-03-20 LAB — ECHOCARDIOGRAM COMPLETE
Height: 65 in
Weight: 2543.23 oz

## 2017-03-20 LAB — GLUCOSE, CAPILLARY
GLUCOSE-CAPILLARY: 94 mg/dL (ref 65–99)
GLUCOSE-CAPILLARY: 139 mg/dL — AB (ref 65–99)
GLUCOSE-CAPILLARY: 114 mg/dL — AB (ref 65–99)

## 2017-03-20 MED ORDER — SENNOSIDES-DOCUSATE SODIUM 8.6-50 MG PO TABS
1.0000 | ORAL_TABLET | Freq: Two times a day (BID) | ORAL | Status: DC
  Administered 2017-03-20 – 2017-03-22 (×6): 1 via ORAL
  Filled 2017-03-20 (×7): qty 1

## 2017-03-20 MED ORDER — HYDROMORPHONE HCL 1 MG/ML IJ SOLN
2.0000 mg | INTRAMUSCULAR | Status: DC | PRN
  Administered 2017-03-20 – 2017-03-22 (×7): 2 mg via INTRAVENOUS
  Filled 2017-03-20 (×7): qty 2

## 2017-03-20 MED ORDER — LIDOCAINE 5 % EX PTCH
1.0000 | MEDICATED_PATCH | CUTANEOUS | Status: DC
  Administered 2017-03-21 – 2017-03-22 (×2): 1 via TRANSDERMAL
  Filled 2017-03-20 (×3): qty 1

## 2017-03-20 MED ORDER — MORPHINE SULFATE (PF) 4 MG/ML IV SOLN
6.0000 mg | Freq: Once | INTRAVENOUS | Status: AC
  Administered 2017-03-20: 6 mg via INTRAVENOUS
  Filled 2017-03-20: qty 2

## 2017-03-20 MED ORDER — POLYETHYLENE GLYCOL 3350 17 G PO PACK
17.0000 g | PACK | Freq: Every day | ORAL | Status: DC
  Administered 2017-03-20 – 2017-03-22 (×3): 17 g via ORAL
  Filled 2017-03-20 (×4): qty 1

## 2017-03-20 MED ORDER — MAGNESIUM SULFATE 2 GM/50ML IV SOLN
2.0000 g | Freq: Once | INTRAVENOUS | Status: AC
  Administered 2017-03-20: 2 g via INTRAVENOUS
  Filled 2017-03-20: qty 50

## 2017-03-20 MED ORDER — METOPROLOL TARTRATE 25 MG PO TABS
25.0000 mg | ORAL_TABLET | Freq: Two times a day (BID) | ORAL | Status: DC
  Administered 2017-03-20 – 2017-03-22 (×6): 25 mg via ORAL
  Filled 2017-03-20 (×6): qty 1

## 2017-03-20 MED ORDER — RIVAROXABAN 20 MG PO TABS
20.0000 mg | ORAL_TABLET | Freq: Every day | ORAL | Status: DC
  Administered 2017-03-20 – 2017-03-22 (×3): 20 mg via ORAL
  Filled 2017-03-20 (×3): qty 1

## 2017-03-20 MED ORDER — POTASSIUM CHLORIDE 20 MEQ/15ML (10%) PO SOLN
40.0000 meq | Freq: Once | ORAL | Status: AC
  Administered 2017-03-20: 40 meq via ORAL
  Filled 2017-03-20: qty 30

## 2017-03-20 MED ORDER — MORPHINE SULFATE (CONCENTRATE) 10 MG/0.5ML PO SOLN
15.0000 mg | ORAL | Status: DC | PRN
  Administered 2017-03-20 (×2): 15 mg via ORAL
  Filled 2017-03-20 (×2): qty 1

## 2017-03-20 MED ORDER — POTASSIUM CHLORIDE CRYS ER 20 MEQ PO TBCR
40.0000 meq | EXTENDED_RELEASE_TABLET | Freq: Once | ORAL | Status: AC
  Administered 2017-03-20: 40 meq via ORAL
  Filled 2017-03-20: qty 2

## 2017-03-20 NOTE — Progress Notes (Signed)
Husband told me that he wanted to see about patient being placed at [facility] after discharge. He does not feel that he can adequately care for her at home. SW consult placed.

## 2017-03-20 NOTE — Progress Notes (Signed)
Initial Nutrition Assessment  DOCUMENTATION CODES:     INTERVENTION:  Ensure Enlive po BID, each supplement provides 350 kcal and 20 grams of protein   Suggest consider liberalizing diet to Regular   Request staff assist with meals if husband is not present due to her increased confusion and poor oral intake  NUTRITION DIAGNOSIS:   Increased nutrient needs related to cancer and cancer related treatments as evidenced by estimated needs.   GOAL:  Patient will meet greater than or equal to 90% of their needs(as feasible given her metastatic disease)   MONITOR:   PO intake, Supplement acceptance  REASON FOR ASSESSMENT:   Malnutrition Screening Tool    ASSESSMENT: The patient is a 67 yo female with metastatic cancer. She is receiving radiation treatments at [facility]. Her husband says she is to have her third treatment this week. She has been spending most of her time in bed and he is getting concerned that he will need additional assistance for her care.   Her husband says she has not been eating much at all since she started on morphine back in mid January. He describes her as more confused than usual and having difficulty getting her to eat. Her lunch tray is here and she has taken in around 25% at most. He has been trying to feed her. She has not been drinking any oral nutrition supplements but is willing to try Ensure while she is here. If she has good acceptance recommend she continue to consume 1-2 daily as long as her appetite is sub optimal.   Weight hx: Shows severe loss of 15.8% in 7 months.  01/25/17 -71.9 kg (158.9 lbs) 06/15/16- 85.7 kg (188.5 lb  Labs: Potassium 2.9 and glucose 124.   Recent Labs  Lab 03/18/17 0500 03/19/17 1627 03/20/17 0246  NA 143 137 137  K 4.0 3.7 2.9*  CL 107 99* 97*  CO2 29 24 26   BUN 18 18 15   CREATININE 0.58 0.78 0.62  CALCIUM 9.4 9.4 9.0  MG  --  1.7  --   GLUCOSE 123* 141* 124*    NUTRITION - FOCUSED PHYSICAL EXAM:  Moderate loss  muscle deltoid, calves and patellar. No edema.    Diet Order:  Diet heart healthy/carb modified Room service appropriate? Yes; Fluid consistency: Thin  EDUCATION NEEDS:   Not appropriate for education at this time(patient is confused and unable to participate in edcuation at this time)   Skin:  Skin Assessment: Reviewed RN Assessment  Last BM:     Height:   Ht Readings from Last 1 Encounters:  03/19/17 5\' 5"  (1.651 m)    Weight:   Wt Readings from Last 1 Encounters:  03/20/17 158 lb 15.2 oz (72.1 kg)    Ideal Body Weight:  57 kg  BMI:  Body mass index is 26.45 kg/m.  Estimated Nutritional Needs:   Kcal:  [phone]  Protein:  90-99 gr  Fluid:  1.8-2.0 liters daily  Colman Cater MS,RD,CSG,LDN Office: [phone] Pager: [phone]

## 2017-03-20 NOTE — Progress Notes (Signed)
Echocardiogram 2D Echocardiogram has been performed.  [name] 03/20/2017, 3:33 PM

## 2017-03-20 NOTE — Progress Notes (Signed)
Palliative Medicine RN Note: Consult order noted for assistance with symptom management. PMT provider is not available at [facility] until tomorrow, and she will see Ms Edelman at that time. If recommendations are needed in the interim, please call our office between 0700 & 1900.  Marjie Skiff Maliki Gignac, RN, BSN, [facility] Palliative Medicine Team 03/20/2017 2:41 PM Office [phone]

## 2017-03-20 NOTE — Progress Notes (Signed)
Pt crying out in pain. MD paged. Received 1X dose of 6mg  Morphine IV.

## 2017-03-20 NOTE — Progress Notes (Addendum)
PROGRESS NOTE                                                                                                                                                                                                             Patient Demographics:    [name], is a 67 y.o. female, DOB - 05/17/1950, MMH:[id number]  Admit date - 03/19/2017   Admitting Physician Pratik Darleen Crocker, DO  Outpatient Primary MD for the patient is Jani Gravel, MD  LOS - 1  Outpatient Specialists: oncology  Chief Complaint  Patient presents with  . Irregular Heartbeat       Brief Narrative   67 y.o. female with medical history significant for widely metastatic cancer-currently on palliative care for pain management, prior DVTs on Xarelto, diabetes, hypertension, RA, and depression, who was recently discharged from [facility] after being admitted for pain control sent by her hospice home nurse tachycardia and found to be in rapid Afib. Placed on amiodarone drip.    Subjective:   Had uncontrolled pain overnight. Better after morphine switched to prn dilaudid.   Assessment  & Plan :    Principal Problem:   New onset Atrial fibrillation with RVR (HCC) Off amiodarone drip. Echo with normal EF and no WMA. metoprolol BID. Hypokalemia replenished TSH normal. On xarelto already for hx of DVT. Cardiology consult appreciated.  Active Problems: Metastatic malignancy with chronic pain.  Adjusted pain meds. getting radiation therapy at [facility]. D/w Dr carb who is seeing her [facility].  Currently on radiation therapy ( received 2/10 days of radiation). D/w Dr Isidore Moos. She will notify Dr Tammi Klippel to schedule the treatment for early tomorrow to facilitate her discharge later in the day. PT eval. Pt interested in going to SNF.  Recurrent left LE DVT on xarelto.  Rheumatoid arthritis   Continue on Plaquenil and steroids.  Type 2 diabetes,  controlled.   Continue on home hypoglycemics with SSI and carb modified diet.  Depression.   Continue on Cymbalta.      Code Status : DNR  Family Communication  : husband at bedside  Disposition Plan  : possibly home vs SNF tomorrow  Barriers For Discharge : improving symptoms, pain control  Consults  :   Cardiology  oncology  Procedures  : echo  DVT Prophylaxis  :  xarelto  Lab  Results  Component Value Date   PLT 202 03/20/2017    Antibiotics  :   Anti-infectives (From admission, onward)   None        Objective:   Vitals:   03/20/17 0645 03/20/17 0700 03/20/17 0800 03/20/17 1210  BP: (!) 153/81 (!) 168/65 (!) 150/59   Pulse: 94 93 95   Resp: 13 (!) 21 (!) 21   Temp:   (!) 97.4 F (36.3 C) 98.6 F (37 C)  TempSrc:   Axillary Axillary  SpO2: 98% 95% (!) 88%   Weight:      Height:        Wt Readings from Last 3 Encounters:  03/20/17 72.1 kg (158 lb 15.2 oz)  03/18/17 74.4 kg (164 lb 0.4 oz)  03/14/17 75.7 kg (166 lb 12.8 oz)     Intake/Output Summary (Last 24 hours) at 03/20/2017 1555 Last data filed at 03/20/2017 1500 Gross per 24 hour  Intake 456.09 ml  Output 700 ml  Net -243.91 ml     Physical Exam  Gen:  in distress with pain HEENT:  moist mucosa, supple neck Chest: clear b/l, no added sounds CVS:  S1&S2 irregular, no murmurs,  GI: soft, NT, ND,  Musculoskeletal: warm, no edema,      Data Review:    CBC Recent Labs  Lab 03/14/17 1044  03/16/17 0431 03/17/17 0440 03/18/17 0500 03/19/17 1627 03/20/17 0246  WBC 18.9*   < > 14.4* 14.0* 11.8* 16.6* 13.2*  HGB 8.4*   < > 8.8* 8.1* 8.5* 9.4* 8.6*  HCT 31.0*   < > 31.3* 28.3* 31.4* 33.5* 30.2*  PLT 282   < > 253 220 250 217 202  MCV 79.5   < > 77.9* 78.0 79.9 78.1 76.6*  MCH 21.5*   < > 21.9* 22.3* 21.6* 21.9* 21.8*  MCHC 27.1*   < > 28.1* 28.6* 27.1* 28.1* 28.5*  RDW 18.6*   < > 18.7* 18.9* 18.6* 18.6* 18.4*  LYMPHSABS 2.4  --   --   --   --  1.2  --   MONOABS 1.7  --   --    --   --  1.2*  --   EOSABS 0.1  --   --   --   --  0.2  --   BASOSABS 0.0  --   --   --   --  0.0  --    < > = values in this interval not displayed.    Chemistries  Recent Labs  Lab 03/14/17 1044  03/16/17 0416 03/17/17 0440 03/18/17 0500 03/19/17 1627 03/20/17 0246  NA 140   < > 141 143 143 137 137  K 3.4*   < > 3.8 3.7 4.0 3.7 2.9*  CL 104   < > 105 109 107 99* 97*  CO2 25   < > '27 27 29 24 26  '$ GLUCOSE 224*   < > 145* 98 123* 141* 124*  BUN 18   < > '16 18 18 18 15  '$ CREATININE 0.68   < > 0.60 0.62 0.58 0.78 0.62  CALCIUM 9.4   < > 8.8* 8.7* 9.4 9.4 9.0  MG  --   --   --   --   --  1.7  --   AST 28  --  23  --   --   --   --   ALT 81*  --  58*  --   --   --   --  ALKPHOS 259*  --  271*  --   --   --   --   BILITOT 0.4  --  0.7  --   --   --   --    < > = values in this interval not displayed.   ------------------------------------------------------------------------------------------------------------------ No results for input(s): CHOL, HDL, LDLCALC, TRIG, CHOLHDL, LDLDIRECT in the last 72 hours.  Lab Results  Component Value Date   HGBA1C 6.9 (H) 02/24/2017   ------------------------------------------------------------------------------------------------------------------ Recent Labs    03/19/17 2042  TSH 0.225*   ------------------------------------------------------------------------------------------------------------------ No results for input(s): VITAMINB12, FOLATE, FERRITIN, TIBC, IRON, RETICCTPCT in the last 72 hours.  Coagulation profile Recent Labs  Lab 03/16/17 0416  INR 1.47    No results for input(s): DDIMER in the last 72 hours.  Cardiac Enzymes Recent Labs  Lab 03/19/17 2042 03/20/17 0246 03/20/17 0824  TROPONINI 0.10* 0.11* 0.11*   ------------------------------------------------------------------------------------------------------------------    Component Value Date/Time   BNP 251.0 (H) 03/19/2017 1627    Inpatient  Medications  Scheduled Meds: . insulin aspart  0-15 Units Subcutaneous TID WC  . metoprolol tartrate  25 mg Oral BID  . morphine  30 mg Oral Q12H  . polyethylene glycol  17 g Oral Daily  . rivaroxaban  20 mg Oral Q supper  . senna-docusate  1 tablet Oral BID  . sodium chloride flush  3 mL Intravenous Q12H   Continuous Infusions: . sodium chloride     PRN Meds:.sodium chloride, HYDROmorphone (DILAUDID) injection, sodium chloride flush  Micro Results No results found for this or any previous visit (from the past 240 hour(s)).  Radiology Reports Mr Virgel Paling GQ Contrast  Result Date: 03/10/2017 CLINICAL DATA:  Stroke follow-up. History of aneurysm coiling. Weakness and difficulty walking. Cervical and breast cancer history. EXAM: MRA NECK WITHOUT CONTRAST MRA HEAD WITHOUT CONTRAST TECHNIQUE: Multiplanar and multiecho pulse sequences of the neck were obtained without intravenous contrast. Angiographic images of the neck were obtained using MRA technique without intravenous contrast.; Angiographic images of the Circle of Willis were obtained using MRA technique without intravenous contrast. COMPARISON:  None. FINDINGS: MRA NECK FINDINGS Normal three-vessel aortic arch branching pattern. The visualized portions of the subclavian arteries are normal. Both vertebral artery origins are normal. The vertebral system is left dominant. Moderate narrowing of the left vertebral artery mid V2 segment (series 6, image 128). The remainder of the left vertebral artery course is normal. No focal stenosis of the right vertebral artery. There is moderate atherosclerotic plaque at the right carotid bifurcation causing approximately 50% narrowing of the distal right common carotid artery. No hemodynamically significant stenosis of the right internal carotid artery. Atherosclerotic plaque at the left carotid bifurcation extending into the left internal carotid artery results in critical stenosis of the left ICA with  severe narrowing along the remainder of the cervical internal carotid artery course. This has worsened slightly relative to 05/25/2011. MRA HEAD FINDINGS Intracranial internal carotid arteries: Minimal flow related enhancement of the left internal carotid artery to the ophthalmic segment. No flow related enhancement within the left carotid terminus. No right internal carotid artery stenosis. Status post coiling of right P-comm origin aneurysm. No residual aneurysm filling. Anterior cerebral arteries: Normal right anterior cerebral artery. There is no flow related enhancement within the left ACA A1 segment. Enhancement of the left A2 segment is via the anterior communicating artery. Middle cerebral arteries: Normal right MCA. No flow related enhancement in the left MCA M1 segment. Unchanged degree of collateralization in the  distal left MCA distribution. Posterior communicating arteries: Patent on the left. Incomplete on the right. Status post coiling of right P-comm origin aneurysm. Posterior cerebral arteries: Normal. Basilar artery: Normal. Vertebral arteries: Left dominant. Normal. Superior cerebellar arteries: Normal. Anterior inferior cerebellar arteries: Normal. Posterior inferior cerebellar arteries: Normal. IMPRESSION: 1. Critical stenosis of the left internal carotid artery, slightly worsened at the left ICA origin, but otherwise unchanged with minimal flow related enhancement to the level of the ophthalmic segment and no enhancement beyond. No right ICA stenosis. 2. Unchanged occlusion of the left anterior cerebral artery A 1 segment and left middle cerebral artery M 1 segment. 3. Unchanged moderate narrowing of the mid P 2 segment of the left vertebral artery. This may be exaggerated by changes in flow direction on this time-of-flight study. 4. Status post coiling of right P-comm origin aneurysm without residual aneurysm filling. Electronically Signed   By: Ulyses Jarred M.D.   On: 03/10/2017 14:27   Mr  Jodene Nam Neck Wo Contrast  Result Date: 03/10/2017 CLINICAL DATA:  Stroke follow-up. History of aneurysm coiling. Weakness and difficulty walking. Cervical and breast cancer history. EXAM: MRA NECK WITHOUT CONTRAST MRA HEAD WITHOUT CONTRAST TECHNIQUE: Multiplanar and multiecho pulse sequences of the neck were obtained without intravenous contrast. Angiographic images of the neck were obtained using MRA technique without intravenous contrast.; Angiographic images of the Circle of Willis were obtained using MRA technique without intravenous contrast. COMPARISON:  None. FINDINGS: MRA NECK FINDINGS Normal three-vessel aortic arch branching pattern. The visualized portions of the subclavian arteries are normal. Both vertebral artery origins are normal. The vertebral system is left dominant. Moderate narrowing of the left vertebral artery mid V2 segment (series 6, image 128). The remainder of the left vertebral artery course is normal. No focal stenosis of the right vertebral artery. There is moderate atherosclerotic plaque at the right carotid bifurcation causing approximately 50% narrowing of the distal right common carotid artery. No hemodynamically significant stenosis of the right internal carotid artery. Atherosclerotic plaque at the left carotid bifurcation extending into the left internal carotid artery results in critical stenosis of the left ICA with severe narrowing along the remainder of the cervical internal carotid artery course. This has worsened slightly relative to 05/25/2011. MRA HEAD FINDINGS Intracranial internal carotid arteries: Minimal flow related enhancement of the left internal carotid artery to the ophthalmic segment. No flow related enhancement within the left carotid terminus. No right internal carotid artery stenosis. Status post coiling of right P-comm origin aneurysm. No residual aneurysm filling. Anterior cerebral arteries: Normal right anterior cerebral artery. There is no flow related  enhancement within the left ACA A1 segment. Enhancement of the left A2 segment is via the anterior communicating artery. Middle cerebral arteries: Normal right MCA. No flow related enhancement in the left MCA M1 segment. Unchanged degree of collateralization in the distal left MCA distribution. Posterior communicating arteries: Patent on the left. Incomplete on the right. Status post coiling of right P-comm origin aneurysm. Posterior cerebral arteries: Normal. Basilar artery: Normal. Vertebral arteries: Left dominant. Normal. Superior cerebellar arteries: Normal. Anterior inferior cerebellar arteries: Normal. Posterior inferior cerebellar arteries: Normal. IMPRESSION: 1. Critical stenosis of the left internal carotid artery, slightly worsened at the left ICA origin, but otherwise unchanged with minimal flow related enhancement to the level of the ophthalmic segment and no enhancement beyond. No right ICA stenosis. 2. Unchanged occlusion of the left anterior cerebral artery A 1 segment and left middle cerebral artery M 1 segment. 3. Unchanged moderate narrowing  of the mid P 2 segment of the left vertebral artery. This may be exaggerated by changes in flow direction on this time-of-flight study. 4. Status post coiling of right P-comm origin aneurysm without residual aneurysm filling. Electronically Signed   By: Ulyses Jarred M.D.   On: 03/10/2017 14:27   Mr Jeri Cos OV Contrast  Result Date: 02/21/2017 CLINICAL DATA:  Breast cancer. Metastatic lung cancer. History of right posterior communicating artery aneurysm coiling. EXAM: MRI HEAD WITHOUT AND WITH CONTRAST TECHNIQUE: Multiplanar, multiecho pulse sequences of the brain and surrounding structures were obtained without and with intravenous contrast. CONTRAST:  15 mL MultiHance IV COMPARISON:  MRI head 01/30/2017, 05/25/2011 FINDINGS: Brain: New area of restricted diffusion head of caudate on the left compatible with acute/subacute infarct. Previously noted small  areas of restricted diffusion in the right occipital lobe now shows ill-defined enhancement, most consistent with subacute infarct. Small areas of restricted diffusion left parietal cortex and left posterior temporal lobe have resolved. Findings most compatible with ischemia rather than metastatic disease. Chronic microvascular ischemic changes in the white matter and pons. Small chronic infarct left frontal lobe. Negative for hemorrhage. Postcontrast imaging degraded by motion. 6 mm enhancing lesion right occipital lobe has angular margins and most compatible with subacute enhancing infarct based on prior MRI and morphology. No other worrisome enhancing lesions. Vascular: Prior coiling of right posterior communicating artery aneurysm with susceptibility. Chronic occlusion left internal carotid artery Skull and upper cervical spine: C3 vertebral body lesion has progressed in the interval compatible with metastatic disease. No calvarial lesions. Sinuses/Orbits: Negative Other: None IMPRESSION: When compared with the prior MRI of 01/30/2017, there is improvement in the areas of restricted diffusion in the left parietal and left temporal lobe compatible with infarction. There enhancement of the small lesion in the right occipital lobe most consistent with subacute infarction. There is a new area of subacute infarct in the head of caudate on the left. Chronic occlusion left internal carotid artery with chronic ischemic changes as above. No definite metastatic disease in the brain. Continued close followup is warranted. Progression of bone marrow lesion in the C3 vertebral body compatible with metastatic disease. Electronically Signed   By: Franchot Gallo M.D.   On: 02/21/2017 07:52   US Biopsy (liver)  Result Date: 03/16/2017 INDICATION: Metastatic lung cancer, hepatic mass EXAM: ULTRASOUND GUIDED CORE BIOPSY OF RIGHT HEPATIC MASS MEDICATIONS: 1% LIDOCAINE LOCAL ANESTHESIA/SEDATION: Versed 2.'0mg'$  IV; Fentanyl 153mg  IV; Moderate Sedation Time:  11 MINUTES The patient was continuously monitored during the procedure by the interventional radiology nurse under my direct supervision. FLUOROSCOPY TIME:  Fluoroscopy Time: NONE. COMPLICATIONS: None immediate. PROCEDURE: The procedure, risks, benefits, and alternatives were explained to the patient. Questions regarding the procedure were encouraged and answered. The patient understands and consents to the procedure. Previous imaging reviewed. Patient positioned right anterior oblique. Preliminary ultrasound performed. The 3.6 cm hypoechoic solid lesion in the right hepatic dome was localized. Overlying skin marked. Under sterile conditions and local anesthesia, a 17 gauge 11.8 cm access needle was advanced percutaneously into the lesion. Needle position confirmed with ultrasound. Images obtained for documentation. 3 18 gauge core biopsies obtained. Samples placed in formalin. Needle tract occluded with Gel-Foam. Postprocedure imaging demonstrates no hemorrhage or hematoma. Patient tolerated the biopsy well. FINDINGS: Imaging confirms needle placed into the right hepatic dome lesion for core biopsy IMPRESSION: Successful ultrasound right hepatic dome mass 18 gauge core biopsy Electronically Signed   By: MJerilynn Mages  Shick M.D.   On:  03/16/2017 13:03   Ir US Guide Vasc Access Right  Result Date: 03/08/2017 INDICATION: 67 year old with metastatic non-small cell lung cancer. Planning for chemotherapy. EXAM: FLUOROSCOPIC AND ULTRASOUND GUIDED PLACEMENT OF A SUBCUTANEOUS PORT COMPARISON:  None. MEDICATIONS: Ancef 2 g; The antibiotic was administered within an appropriate time interval prior to skin puncture. ANESTHESIA/SEDATION: Versed 2.0 mg IV; Fentanyl 100 mcg IV; Moderate Sedation Time:  36 minutes The patient was continuously monitored during the procedure by the interventional radiology nurse under my direct supervision. FLUOROSCOPY TIME:  18 seconds, 3 mGy COMPLICATIONS: None immediate.  PROCEDURE: The procedure, risks, benefits, and alternatives were explained to the patient. Questions regarding the procedure were encouraged and answered. The patient understands and consents to the procedure. Patient was placed supine on the interventional table. Ultrasound confirmed a patent right internal jugular vein. The right chest and neck were cleaned with a skin antiseptic and a sterile drape was placed. Maximal barrier sterile technique was utilized including caps, mask, sterile gowns, sterile gloves, sterile drape, hand hygiene and skin antiseptic. The right neck was anesthetized with 1% lidocaine. Small incision was made in the right neck with a blade. Micropuncture set was placed in the right internal jugular vein with ultrasound guidance. The micropuncture wire was used for measurement purposes. The right chest was anesthetized with 1% lidocaine with epinephrine. #15 blade was used to make an incision and a subcutaneous port pocket was formed. [place] was assembled. Subcutaneous tunnel was formed with a stiff tunneling device. The port catheter was brought through the subcutaneous tunnel. The port was placed in the subcutaneous pocket and sutured in place. The micropuncture set was exchanged for a peel-away sheath. The catheter was placed through the peel-away sheath and the tip was positioned at the superior cavoatrial junction. Catheter placement was confirmed with fluoroscopy. The port was accessed and flushed with heparinized saline. The port pocket was closed using two layers of absorbable sutures and Dermabond. The vein skin site was closed using a single layer of absorbable suture and Dermabond. Sterile dressings were applied. Patient tolerated the procedure well without an immediate complication. Ultrasound and fluoroscopic images were taken and saved for this procedure. IMPRESSION: Placement of a subcutaneous port device. Electronically Signed   By: Markus Daft M.D.   On: 03/08/2017  16:54   Ct Biopsy  Result Date: 02/21/2017 INDICATION: Bone lesions EXAM: CT BIOPSY; CT BONE MARROW BIOPSY AND ASPIRATION MEDICATIONS: None. ANESTHESIA/SEDATION: Fentanyl 100 mcg IV; Versed 2 mg IV Moderate Sedation Time:  20 minutes The patient was continuously monitored during the procedure by the interventional radiology nurse under my direct supervision. FLUOROSCOPY TIME:  Fluoroscopy Time:  minutes  seconds ( mGy). COMPLICATIONS: None immediate. PROCEDURE: Informed written consent was obtained from the patient after a thorough discussion of the procedural risks, benefits and alternatives. All questions were addressed. Maximal Sterile Barrier Technique was utilized including caps, mask, sterile gowns, sterile gloves, sterile drape, hand hygiene and skin antiseptic. A timeout was performed prior to the initiation of the procedure. Under CT guidance, a(n) 11 gauge guide needle was advanced into the right iliac bone. Aspirates and a core were obtained. Subsequently, the 11 gauge needle was directed towards a sclerotic bone lesion in the right iliac bone and a final 11 gauge core was obtained. Post biopsy images demonstrate no hemorrhage. Patient tolerated the procedure well without complication. Vital sign monitoring by nursing staff during the procedure will continue as patient is in the special procedures unit for post procedure  observation. The images document guide needle placement within the normal portion of the right iliac bone. Subsequent images demonstrate needle placement in the sclerotic lesion within the right iliac bone. Post biopsy images demonstrate no hemorrhage. FINDINGS: The images document guide needle placement within the normal portion of the right iliac bone. Subsequent images demonstrate needle placement in the sclerotic lesion within the right iliac bone. Post biopsy images demonstrate no hemorrhage. IMPRESSION: Successful right sclerotic bone lesion core biopsy. Successful right iliac  bone marrow biopsy. Electronically Signed   By: Marybelle Killings M.D.   On: 02/21/2017 11:20   Ct Biopsy  Result Date: 02/21/2017 INDICATION: Bone lesions EXAM: CT BIOPSY; CT BONE MARROW BIOPSY AND ASPIRATION MEDICATIONS: None. ANESTHESIA/SEDATION: Fentanyl 100 mcg IV; Versed 2 mg IV Moderate Sedation Time:  20 minutes The patient was continuously monitored during the procedure by the interventional radiology nurse under my direct supervision. FLUOROSCOPY TIME:  Fluoroscopy Time:  minutes  seconds ( mGy). COMPLICATIONS: None immediate. PROCEDURE: Informed written consent was obtained from the patient after a thorough discussion of the procedural risks, benefits and alternatives. All questions were addressed. Maximal Sterile Barrier Technique was utilized including caps, mask, sterile gowns, sterile gloves, sterile drape, hand hygiene and skin antiseptic. A timeout was performed prior to the initiation of the procedure. Under CT guidance, a(n) 11 gauge guide needle was advanced into the right iliac bone. Aspirates and a core were obtained. Subsequently, the 11 gauge needle was directed towards a sclerotic bone lesion in the right iliac bone and a final 11 gauge core was obtained. Post biopsy images demonstrate no hemorrhage. Patient tolerated the procedure well without complication. Vital sign monitoring by nursing staff during the procedure will continue as patient is in the special procedures unit for post procedure observation. The images document guide needle placement within the normal portion of the right iliac bone. Subsequent images demonstrate needle placement in the sclerotic lesion within the right iliac bone. Post biopsy images demonstrate no hemorrhage. FINDINGS: The images document guide needle placement within the normal portion of the right iliac bone. Subsequent images demonstrate needle placement in the sclerotic lesion within the right iliac bone. Post biopsy images demonstrate no hemorrhage.  IMPRESSION: Successful right sclerotic bone lesion core biopsy. Successful right iliac bone marrow biopsy. Electronically Signed   By: Marybelle Killings M.D.   On: 02/21/2017 11:20   Dg Chest Port 1 View  Result Date: 03/19/2017 CLINICAL DATA:  Heart palpitations. Metastatic cancer. Previously treated breast cancer. EXAM: PORTABLE CHEST 1 VIEW COMPARISON:  01/18/2017.  PET-CT dated 02/06/2017. FINDINGS: Poor inspiration. Borderline enlarged cardiac silhouette. Increased prominence of the pulmonary vasculature and interstitial markings. No visible pleural fluid. Left axillary surgical clips. Right jugular porta catheter tip in the superior vena cava. Mottled appearance of the right humeral neck. No abnormal activity at that location on the previous PET-CT. IMPRESSION: 1. Poor inspiration with interval borderline cardiomegaly, pulmonary vascular congestion and probable mild interstitial pulmonary edema. 2. Possible interval right humeral neck metastasis. Electronically Signed   By: Claudie Revering M.D.   On: 03/19/2017 17:19   Ct Bone Marrow Biopsy & Aspiration  Result Date: 02/21/2017 INDICATION: Bone lesions EXAM: CT BIOPSY; CT BONE MARROW BIOPSY AND ASPIRATION MEDICATIONS: None. ANESTHESIA/SEDATION: Fentanyl 100 mcg IV; Versed 2 mg IV Moderate Sedation Time:  20 minutes The patient was continuously monitored during the procedure by the interventional radiology nurse under my direct supervision. FLUOROSCOPY TIME:  Fluoroscopy Time:  minutes  seconds ( mGy). COMPLICATIONS: None  immediate. PROCEDURE: Informed written consent was obtained from the patient after a thorough discussion of the procedural risks, benefits and alternatives. All questions were addressed. Maximal Sterile Barrier Technique was utilized including caps, mask, sterile gowns, sterile gloves, sterile drape, hand hygiene and skin antiseptic. A timeout was performed prior to the initiation of the procedure. Under CT guidance, a(n) 11 gauge guide needle  was advanced into the right iliac bone. Aspirates and a core were obtained. Subsequently, the 11 gauge needle was directed towards a sclerotic bone lesion in the right iliac bone and a final 11 gauge core was obtained. Post biopsy images demonstrate no hemorrhage. Patient tolerated the procedure well without complication. Vital sign monitoring by nursing staff during the procedure will continue as patient is in the special procedures unit for post procedure observation. The images document guide needle placement within the normal portion of the right iliac bone. Subsequent images demonstrate needle placement in the sclerotic lesion within the right iliac bone. Post biopsy images demonstrate no hemorrhage. FINDINGS: The images document guide needle placement within the normal portion of the right iliac bone. Subsequent images demonstrate needle placement in the sclerotic lesion within the right iliac bone. Post biopsy images demonstrate no hemorrhage. IMPRESSION: Successful right sclerotic bone lesion core biopsy. Successful right iliac bone marrow biopsy. Electronically Signed   By: Marybelle Killings M.D.   On: 02/21/2017 11:20   Ir Fluoro Guide Port Insertion Right  Result Date: 03/08/2017 INDICATION: 68 year old with metastatic non-small cell lung cancer. Planning for chemotherapy. EXAM: FLUOROSCOPIC AND ULTRASOUND GUIDED PLACEMENT OF A SUBCUTANEOUS PORT COMPARISON:  None. MEDICATIONS: Ancef 2 g; The antibiotic was administered within an appropriate time interval prior to skin puncture. ANESTHESIA/SEDATION: Versed 2.0 mg IV; Fentanyl 100 mcg IV; Moderate Sedation Time:  36 minutes The patient was continuously monitored during the procedure by the interventional radiology nurse under my direct supervision. FLUOROSCOPY TIME:  18 seconds, 3 mGy COMPLICATIONS: None immediate. PROCEDURE: The procedure, risks, benefits, and alternatives were explained to the patient. Questions regarding the procedure were encouraged and  answered. The patient understands and consents to the procedure. Patient was placed supine on the interventional table. Ultrasound confirmed a patent right internal jugular vein. The right chest and neck were cleaned with a skin antiseptic and a sterile drape was placed. Maximal barrier sterile technique was utilized including caps, mask, sterile gowns, sterile gloves, sterile drape, hand hygiene and skin antiseptic. The right neck was anesthetized with 1% lidocaine. Small incision was made in the right neck with a blade. Micropuncture set was placed in the right internal jugular vein with ultrasound guidance. The micropuncture wire was used for measurement purposes. The right chest was anesthetized with 1% lidocaine with epinephrine. #15 blade was used to make an incision and a subcutaneous port pocket was formed. [place] was assembled. Subcutaneous tunnel was formed with a stiff tunneling device. The port catheter was brought through the subcutaneous tunnel. The port was placed in the subcutaneous pocket and sutured in place. The micropuncture set was exchanged for a peel-away sheath. The catheter was placed through the peel-away sheath and the tip was positioned at the superior cavoatrial junction. Catheter placement was confirmed with fluoroscopy. The port was accessed and flushed with heparinized saline. The port pocket was closed using two layers of absorbable sutures and Dermabond. The vein skin site was closed using a single layer of absorbable suture and Dermabond. Sterile dressings were applied. Patient tolerated the procedure well without an immediate complication. Ultrasound  and fluoroscopic images were taken and saved for this procedure. IMPRESSION: Placement of a subcutaneous port device. Electronically Signed   By: Markus Daft M.D.   On: 03/08/2017 16:54   Vas [place] Lower Extremity Venous (dvt)  Result Date: 02/24/2017  Lower Venous Study Indication: Pain. Examination Guidelines: A  complete evaluation includes B-mode imaging, spectral doppler, color doppler, and power doppler as needed of all accessible portions of each vessel. Bilateral testing is considered an integral part of a complete examination. Limited examinations for reoccurring indications may be performed as noted. The reflux portion of the exam is performed with the patient in reverse Trendelenburg.  Right Venous Findings: +---+---------------+---------+-----------+----------+-------+    CompressibilityPhasicitySpontaneityPropertiesSummary +---+---------------+---------+-----------+----------+-------+ CFVFull           Yes      Yes                          +---+---------------+---------+-----------+----------+-------+  Left Venous Findings: +---------+---------------+---------+-----------+----------+-------+          CompressibilityPhasicitySpontaneityPropertiesSummary +---------+---------------+---------+-----------+----------+-------+ CFV      Full           Yes      Yes                  Acute   +---------+---------------+---------+-----------+----------+-------+ FV Prox  Partial        Yes      Yes                  Acute   +---------+---------------+---------+-----------+----------+-------+ FV Mid   Partial        Yes      Yes                  Acute   +---------+---------------+---------+-----------+----------+-------+ FV DistalPartial        Yes      Yes                  Acute   +---------+---------------+---------+-----------+----------+-------+ PFV      Full                                                 +---------+---------------+---------+-----------+----------+-------+ POP      Partial        Yes      Yes                  Acute   +---------+---------------+---------+-----------+----------+-------+ PTV      Partial                                      Acute   +---------+---------------+---------+-----------+----------+-------+ PERO     Partial                                       Acute   +---------+---------------+---------+-----------+----------+-------+ GSV      Partial                                      Acute   +---------+---------------+---------+-----------+----------+-------+ SSV      Full                                                 +---------+---------------+---------+-----------+----------+-------+  Final Interpretation Right: No evidence of common femoral vein obstruction. Left: There is evidence of acute DVT in the Femoral vein, Popliteal vein, Posterior Tibial vein, Peroneal vein, and Gastrocnemius vein. There is no evidence of superficial venous thrombosis. No cystic structure found in the popliteal fossa.  *See table(s) above for measurements and observations.  Electronically signed by Servando Snare on 02/24/2017 at 9:31:02 PM.    Time Spent in minutes  25   [name] M.D on 03/20/2017 at 3:55 PM  Between 7am to 7pm - Pager - [phone]  After 7pm go to [email or web address] - password [email or web address]  [facility] -  Office  [phone]

## 2017-03-20 NOTE — Progress Notes (Signed)
Pt has been in severe pain all night. Pain located in R hip. Pt has had no relief with PRN morphine dose. Pt is confused and pulled out her IV. HR continues to be elevated 110's-120's.  Margaret Pyle, RN

## 2017-03-20 NOTE — Consult Note (Signed)
Cardiology Consultation:   Patient ID: [name]; [id number]; [birth year]   Admit date: 03/19/2017 Date of Consult: 03/20/2017  Primary Care Provider: Jani Gravel, MD Primary Cardiologist: New (Dr. Bronson Ing)   Patient Profile:   [name] is a 67 y.o. female with a hx of stage IV non-small cell lung cancer with widely metastatic disease who is being seen today for the evaluation of atrial fibrillation at the request of Dr. Manuella Ghazi.  History of Present Illness:   [name] is a 67 year old woman with a history of stage IV non-small cell lung cancer with widely metastatic disease including lesions in the liver.  She has chronic pain due to this.  She also has a history of left lower extremity DVT diagnosed in December 2017 and is on Xarelto.  She has rheumatoid arthritis and is on Plaquenil.  She has a prior history of tobacco abuse and quit in the 1980s.  She was recently hospitalized at [facility] long for the management of malignancy related back pain.  She then presented to the ED yesterday for an irregular heart rhythm diagnosed by her hospice provider.  When she presented to the ED she was found to be in rapid atrial fibrillation.  The patient was entirely asymptomatic from the standpoint, denying chest pain, palpitations, and shortness of breath.  Internal medicine contacted cardiology on call who recommended IV amiodarone.  She is already anticoagulated with Xarelto for DVT.  Chest x-ray demonstrated pulmonary vascular congestion and probable mild interstitial pulmonary edema.  There is question of interval right humeral neck metastasis.  She was given 1 dose of IV Lasix 40 mg.  She has had at least 400 cc of output.  Relevant labs: BUN 15, creatinine 0.62, hypokalemia with potassium 2.9 (3.7 yesterday), leukocytosis with white blood cell count of 13.2, anemia with hemoglobin 8.6, platelets 202, troponin 0 0.1 x 2, elevated BNP of 251.  TSH low at 0.225.  She is currently in  sinus rhythm with PVCs.  She denies chest pain and palpitations.   Past Medical History:  Diagnosis Date  . Anemia   . Angina   . Arthritis    Rheumatoid  . Brain cancer ([place])   . Breast cancer ([place])   . Cancer ([place])    left breast, cervical cancer  . Colon polyps   . Constipation   . Diabetes mellitus without complication ([place])   . Diarrhea   . Dysrhythmia   . GERD (gastroesophageal reflux disease)   . Headache   . Heart murmur   . History of kidney stones   . Hypercholesterolemia   . Hypertension   . Lung cancer ([place])   . Numbness and tingling in right hand   . Numbness and tingling of both feet   . Peripheral vascular disease ([place])    cerebrel aneurysm /sp coil  . Pneumonia   . PONV (postoperative nausea and vomiting)   . Raynaud's syndrome   . Rheumatoid arthritis with rheumatoid factor (HCC)   . Right knee DJD   . Stroke [facility])    jan 2012  . VTE (venous thromboembolism)     Past Surgical History:  Procedure Laterality Date  . ABDOMINAL HYSTERECTOMY  1977  . APPENDECTOMY  1964  . BILATERAL OOPHORECTOMY    . CHOLECYSTECTOMY    . COLONOSCOPY  12/09/2010   Procedure: COLONOSCOPY;  Surgeon: Rogene Houston, MD;  [facility];  Service: Endoscopy;  Laterality: N/A;  . COLONOSCOPY N/A 06/26/2015   Procedure:  COLONOSCOPY;  Surgeon: Rogene Houston, MD;  [facility];  Service: Endoscopy;  Laterality: N/A;  1:05-moved to [address] to notify pt  . ESOPHAGOGASTRODUODENOSCOPY N/A 06/26/2015   Procedure: ESOPHAGOGASTRODUODENOSCOPY (EGD);  Surgeon: Rogene Houston, MD;  [facility];  Service: Endoscopy;  Laterality: N/A;  . EYE SURGERY     cataract surgery bilat   . GIVENS CAPSULE STUDY N/A 08/20/2015   Procedure: GIVENS CAPSULE STUDY;  Surgeon: Rogene Houston, MD;  [facility];  Service: Endoscopy;  Laterality: N/A;  730  . IR FLUORO GUIDE PORT INSERTION RIGHT  03/08/2017  . IR US GUIDE VASC ACCESS RIGHT  03/08/2017  . left  breast lumpectomy  2004  . left knee arthroscopy    . Right brain aneurysm  04/2010   Coiled   . right wrist surgery  1999   synevectomy  . TOTAL KNEE ARTHROPLASTY Right 02/25/2013   Procedure: TOTAL KNEE ARTHROPLASTY;  Surgeon: Lorn Junes, MD;  Location: [facility];  Service: Orthopedics;  Laterality: Right;       Inpatient Medications: Scheduled Meds: . insulin aspart  0-15 Units Subcutaneous TID WC  . morphine  30 mg Oral Q12H  . polyethylene glycol  17 g Oral Daily  . senna-docusate  1 tablet Oral BID  . sodium chloride flush  3 mL Intravenous Q12H   Continuous Infusions: . sodium chloride    . amiodarone 30 mg/hr (03/20/17 0905)   PRN Meds: sodium chloride, HYDROmorphone (DILAUDID) injection, sodium chloride flush  Allergies:    Allergies  Allergen Reactions  . Aurothioglucose [Solganal] Other (See Comments)    REACTION: VASCULITIS   . Feldene [Piroxicam] Anaphylaxis and Other (See Comments)    SKIN BLISTERS  . Hydralazine Other (See Comments)    REACTION: Chest pains and headaches: IV only  . Imdur [Isosorbide Mononitrate] Other (See Comments)    REACTION: PATIENT IS UNABLE TO WALK OR FUNCTION  . Imuran [Azathioprine] Other (See Comments)    Could not walk or function  . Methotrexate Derivatives Other (See Comments)    REACTION: VASCULITIS  . Calcium Channel Blockers Palpitations  . Sulfa Antibiotics Diarrhea and Nausea And Vomiting  . Thimerosal Other (See Comments)    SKIN BLISTERED  . Vioxx [Rofecoxib] Other (See Comments)    REACTION: CAUSED MORE JOINT PAIN  . Arava [Leflunomide]     UNSPECIFIED REACTION   . Asacol [Mesalamine]     UNSPECIFIED REACTION   . Iohexol Other (See Comments)    PT STATES SHE WAS TOLD NEVER TO HAVE CT CONTRAST AGAIN.  SHE HAD SOME KIND OF REACTION BUT DOESNT REMEBER WHAT HAPPENED   . Hydrocodone Itching  . Latex Rash  . Neomycin Rash  . Nickel Rash  . Nsaids Other (See Comments)    GI UPSET , CAN TOLERATE SOME NSAIDS  .  Percocet [Oxycodone-Acetaminophen] Other (See Comments)    INSOMNIA    Social History:   Social History   Socioeconomic History  . Marital status: Married    Spouse name: Hedy Camara  . Number of children: o  . Years of education: 25  . Highest education level: Not on file  Social Needs  . Financial resource strain: Not on file  . Food insecurity - worry: Not on file  . Food insecurity - inability: Not on file  . Transportation needs - medical: Not on file  . Transportation needs - non-medical: Not on file  Occupational History  . Occupation: retired  Employer: [employer]    Comment: retired january 2017  Tobacco Use  . Smoking status: Former Smoker    Packs/day: 2.00    Years: 15.00    Pack years: 30.00    Last attempt to quit: 02/20/1984    Years since quitting: 33.1  . Smokeless tobacco: Never Used  . Tobacco comment: Quit 1986  Substance and Sexual Activity  . Alcohol use: No  . Drug use: No  . Sexual activity: Not Currently  Other Topics Concern  . Not on file  Social History Narrative   Employed with [employer]. Retired January 2017. Married with no children. Quit smoking 1986. No alcohol or illicit drug use.    Right handed    Some college.   Caffeine one cup of tea daily.    Family History:    Family History  Problem Relation Age of Onset  . Hypertension Father   . Heart attack Father   . Heart disease Maternal Uncle   . Cancer Maternal Aunt        breast  . Cancer Other        kidney  . Cancer Cousin   . Colon cancer Neg Hx      ROS:  Please see the history of present illness.   All other ROS reviewed and negative.     Physical Exam/Data:   Vitals:   03/20/17 0630 03/20/17 0645 03/20/17 0700 03/20/17 0800  BP: (!) 178/96 (!) 153/81 (!) 168/65 (!) 150/59  Pulse: (!) 105 94 93 95  Resp: 19 13 (!) 21 (!) 21  Temp:    (!) 97.4 F (36.3 C)  TempSrc:    Axillary  SpO2: 97% 98% 95% (!) 88%  Weight:      Height:          Intake/Output Summary (Last 24 hours) at 03/20/2017 1128 Last data filed at 03/20/2017 0500 Gross per 24 hour  Intake 302.45 ml  Output 700 ml  Net -397.55 ml   Filed Weights   03/19/17 1624 03/19/17 2217 03/20/17 0500  Weight: 164 lb (74.4 kg) 158 lb 15.2 oz (72.1 kg) 158 lb 15.2 oz (72.1 kg)   Body mass index is 26.45 kg/m.  General:  Well nourished, well developed, in no acute distress HEENT: normal Lymph: no adenopathy Neck: no JVD Cardiac:  normal S1, S2; RRR; no murmur  Lungs:  clear to auscultation bilaterally, no wheezing, rhonchi or rales  Abd: soft, nontender, no hepatomegaly  Ext: no edema Musculoskeletal:  No deformities, BUE and BLE strength normal and equal Skin: warm and dry  Neuro:  CNs 2-12 intact, no focal abnormalities noted Psych:  Normal affect   EKG:  The EKG was personally reviewed and demonstrates: See HPI Telemetry:  Telemetry was personally reviewed and demonstrates: Sinus rhythm with frequent PVCs  Relevant CV Studies: Echocardiogram pending  Lower extremity Dopplers on 02/24/17 showed acute left lower extremity DVT in the femoral vein, popliteal vein, posterior tibial vein, peroneal vein, and gastrocnemius vein.  Laboratory Data:  Chemistry Recent Labs  Lab 03/18/17 0500 03/19/17 1627 03/20/17 0246  NA 143 137 137  K 4.0 3.7 2.9*  CL 107 99* 97*  CO2 29 24 26   GLUCOSE 123* 141* 124*  BUN 18 18 15   CREATININE 0.58 0.78 0.62  CALCIUM 9.4 9.4 9.0  GFRNONAA >60 >60 >60  GFRAA >60 >60 >60  ANIONGAP 7 14 14     Recent Labs  Lab 03/14/17 1044 03/16/17  0416  PROT 6.0* 6.2*  ALBUMIN 3.0* 3.1*  AST 28 23  ALT 81* 58*  ALKPHOS 259* 271*  BILITOT 0.4 0.7   Hematology Recent Labs  Lab 03/18/17 0500 03/19/17 1627 03/20/17 0246  WBC 11.8* 16.6* 13.2*  RBC 3.93 4.29 3.94  HGB 8.5* 9.4* 8.6*  HCT 31.4* 33.5* 30.2*  MCV 79.9 78.1 76.6*  MCH 21.6* 21.9* 21.8*  MCHC 27.1* 28.1* 28.5*  RDW 18.6* 18.6* 18.4*  PLT 250 217 202    Cardiac Enzymes Recent Labs  Lab 03/19/17 1627 03/19/17 2042 03/20/17 0246 03/20/17 0824  TROPONINI 0.10* 0.10* 0.11* 0.11*   No results for input(s): TROPIPOC in the last 168 hours.  BNP Recent Labs  Lab 03/19/17 1627  BNP 251.0*    DDimer No results for input(s): DDIMER in the last 168 hours.  Radiology/Studies:  US Biopsy (liver)  Result Date: 03/16/2017 INDICATION: Metastatic lung cancer, hepatic mass EXAM: ULTRASOUND GUIDED CORE BIOPSY OF RIGHT HEPATIC MASS MEDICATIONS: 1% LIDOCAINE LOCAL ANESTHESIA/SEDATION: Versed 2.0mg  IV; Fentanyl 178mcg IV; Moderate Sedation Time:  11 MINUTES The patient was continuously monitored during the procedure by the interventional radiology nurse under my direct supervision. FLUOROSCOPY TIME:  Fluoroscopy Time: NONE. COMPLICATIONS: None immediate. PROCEDURE: The procedure, risks, benefits, and alternatives were explained to the patient. Questions regarding the procedure were encouraged and answered. The patient understands and consents to the procedure. Previous imaging reviewed. Patient positioned right anterior oblique. Preliminary ultrasound performed. The 3.6 cm hypoechoic solid lesion in the right hepatic dome was localized. Overlying skin marked. Under sterile conditions and local anesthesia, a 17 gauge 11.8 cm access needle was advanced percutaneously into the lesion. Needle position confirmed with ultrasound. Images obtained for documentation. 3 18 gauge core biopsies obtained. Samples placed in formalin. Needle tract occluded with Gel-Foam. Postprocedure imaging demonstrates no hemorrhage or hematoma. Patient tolerated the biopsy well. FINDINGS: Imaging confirms needle placed into the right hepatic dome lesion for core biopsy IMPRESSION: Successful ultrasound right hepatic dome mass 18 gauge core biopsy Electronically Signed   By: Jerilynn Mages.  Shick M.D.   On: 03/16/2017 13:03   Dg Chest Port 1 View  Result Date: 03/19/2017 CLINICAL DATA:  Heart  palpitations. Metastatic cancer. Previously treated breast cancer. EXAM: PORTABLE CHEST 1 VIEW COMPARISON:  01/18/2017.  PET-CT dated 02/06/2017. FINDINGS: Poor inspiration. Borderline enlarged cardiac silhouette. Increased prominence of the pulmonary vasculature and interstitial markings. No visible pleural fluid. Left axillary surgical clips. Right jugular porta catheter tip in the superior vena cava. Mottled appearance of the right humeral neck. No abnormal activity at that location on the previous PET-CT. IMPRESSION: 1. Poor inspiration with interval borderline cardiomegaly, pulmonary vascular congestion and probable mild interstitial pulmonary edema. 2. Possible interval right humeral neck metastasis. Electronically Signed   By: Claudie Revering M.D.   On: 03/19/2017 17:19    Assessment and Plan:   1.  Rapid atrial fibrillation: She has subsequently converted to sinus rhythm with PVCs on IV amiodarone.  She takes nebivolol at home.  I will switch this to metoprolol 25 mg twice daily.  I will discontinue IV amiodarone.  In order to raise arrhythmia threshold, potassium should be maintained at a level greater than 4.  She is hypokalemic.  TSH is also mildly low.  I will provide supplemental potassium.  An echocardiogram will be performed and I will review the study. She is already receiving systemic anticoagulation with Xarelto 20 milligrams in the outpatient setting. It has not been ordered so I will order.  2.  Acute diastolic heart failure: She is currently euvolemic after receiving 1 dose of IV Lasix 40 mg.  This was likely precipitated by rapid atrial fibrillation.  She can take Lasix 20 mg as needed in the outpatient setting.  3.  Hypokalemia: I will replace with 1 dose of 40 milliequivalents of potassium chloride.  This will also help to suppress PVCs as well as atrial fibrillation.  4.  Chronic pain due to metastatic cancer: She is on pain medications.  5.  Left lower extremity DVT: Xarelto has  not been ordered thus far.  I will order it.  Lower extremity Dopplers from January 2019 detailed above with extensive DVT.   For questions or updates, please contact [place] Please consult [email or web address] for contact info under Cardiology/STEMI.   Signed, Kate Sable, MD  03/20/2017 11:28 AM

## 2017-03-21 ENCOUNTER — Ambulatory Visit
Admission: RE | Admit: 2017-03-21 | Discharge: 2017-03-21 | Disposition: A | Discharge status: Home / Self Care | Payer: Medicare Other | Source: Ambulatory Visit | Attending: Radiation Oncology | Admitting: Radiation Oncology

## 2017-03-21 DIAGNOSIS — R52 Pain, unspecified: Secondary | ICD-10-CM

## 2017-03-21 DIAGNOSIS — C3412 Malignant neoplasm of upper lobe, left bronchus or lung: Secondary | ICD-10-CM | POA: Diagnosis not present

## 2017-03-21 DIAGNOSIS — C7951 Secondary malignant neoplasm of bone: Secondary | ICD-10-CM | POA: Diagnosis not present

## 2017-03-21 DIAGNOSIS — I6523 Occlusion and stenosis of bilateral carotid arteries: Secondary | ICD-10-CM

## 2017-03-21 LAB — GLUCOSE, CAPILLARY
GLUCOSE-CAPILLARY: 110 mg/dL — AB (ref 65–99)
GLUCOSE-CAPILLARY: 80 mg/dL (ref 65–99)
Glucose-Capillary: 101 mg/dL — ABNORMAL HIGH (ref 65–99)
Glucose-Capillary: 100 mg/dL — ABNORMAL HIGH (ref 65–99)

## 2017-03-21 LAB — BASIC METABOLIC PANEL
Anion gap: 11 (ref 5–15)
BUN: 13 mg/dL (ref 6–20)
CHLORIDE: 98 mmol/L — AB (ref 101–111)
CO2: 27 mmol/L (ref 22–32)
CREATININE: 0.52 mg/dL (ref 0.44–1.00)
Calcium: 9.2 mg/dL (ref 8.9–10.3)
GFR calc non Af Amer: >60 mL/min (ref >60)
Glucose, Bld: 113 mg/dL — ABNORMAL HIGH (ref 65–99)
Potassium: 3.5 mmol/L (ref 3.5–5.1)
Sodium: 136 mmol/L (ref 135–145)

## 2017-03-21 LAB — T4, FREE: Free T4: 1.24 ng/dL — ABNORMAL HIGH (ref 0.61–1.12)

## 2017-03-21 MED ORDER — POTASSIUM CHLORIDE CRYS ER 20 MEQ PO TBCR
40.0000 meq | EXTENDED_RELEASE_TABLET | Freq: Once | ORAL | Status: AC
  Administered 2017-03-21: 40 meq via ORAL
  Filled 2017-03-21: qty 2

## 2017-03-21 MED ORDER — POTASSIUM CHLORIDE 20 MEQ/15ML (10%) PO SOLN: 40.0000 meq | Freq: Once | ORAL | Status: DC

## 2017-03-21 NOTE — Progress Notes (Addendum)
Progress Note  Patient Name: [name] Date of Encounter: 03/21/2017  Primary Cardiologist: New (Dr. Bronson Ing)   Subjective   Denies chest pain, palpitations, and shortness of breath. Has some left leg pain.  Inpatient Medications    Scheduled Meds: . insulin aspart  0-15 Units Subcutaneous TID WC  . lidocaine  1 patch Transdermal Q24H  . metoprolol tartrate  25 mg Oral BID  . morphine  30 mg Oral Q12H  . polyethylene glycol  17 g Oral Daily  . rivaroxaban  20 mg Oral Q supper  . senna-docusate  1 tablet Oral BID  . sodium chloride flush  3 mL Intravenous Q12H   Continuous Infusions: . sodium chloride     PRN Meds: sodium chloride, HYDROmorphone (DILAUDID) injection, sodium chloride flush   Vital Signs    Vitals:   03/21/17 0600 03/21/17 0700 03/21/17 0825 03/21/17 0927  BP: (!) 147/56 (!) 145/51  (!) 148/40  Pulse:      Resp: 13 16    Temp:   98.2 F (36.8 C)   TempSrc:      SpO2:      Weight:      Height:        Intake/Output Summary (Last 24 hours) at 03/21/2017 0954 Last data filed at 03/20/2017 2100 Gross per 24 hour  Intake 463.64 ml  Output -  Net 463.64 ml   Filed Weights   03/19/17 2217 03/20/17 0500 03/21/17 0500  Weight: 158 lb 15.2 oz (72.1 kg) 158 lb 15.2 oz (72.1 kg) 158 lb 8.2 oz (71.9 kg)    Telemetry    Sinus rhythm with rare PVC's - Personally Reviewed  ECG    n/a - Personally Reviewed  Physical Exam   GEN: No acute distress.   Neck: No JVD Cardiac: RRR, no murmurs, rubs, or gallops.  Respiratory: Clear to auscultation bilaterally. GI: Soft, nontender, non-distended  MS: No edema; No deformity. Neuro:  Nonfocal  Psych: Normal affect   Labs    Chemistry Recent Labs  Lab 03/14/17 1044  03/16/17 0416  03/19/17 1627 03/20/17 0246 03/21/17 0434  NA 140   < > 141   < > 137 137 136  K 3.4*   < > 3.8   < > 3.7 2.9* 3.5  CL 104   < > 105   < > 99* 97* 98*  CO2 25   < > 27   < > 24 26 27   GLUCOSE 224*   < > 145*    < > 141* 124* 113*  BUN 18   < > 16   < > 18 15 13   CREATININE 0.68   < > 0.60   < > 0.78 0.62 0.52  CALCIUM 9.4   < > 8.8*   < > 9.4 9.0 9.2  PROT 6.0*  --  6.2*  --   --   --   --   ALBUMIN 3.0*  --  3.1*  --   --   --   --   AST 28  --  23  --   --   --   --   ALT 81*  --  58*  --   --   --   --   ALKPHOS 259*  --  271*  --   --   --   --   BILITOT 0.4  --  0.7  --   --   --   --   GFRNONAA >  60   < > >60   < > >60 >60 >60  GFRAA >60   < > >60   < > >60 >60 >60  ANIONGAP 11   < > 9   < > 14 14 11    < > = values in this interval not displayed.     Hematology Recent Labs  Lab 03/18/17 0500 03/19/17 1627 03/20/17 0246  WBC 11.8* 16.6* 13.2*  RBC 3.93 4.29 3.94  HGB 8.5* 9.4* 8.6*  HCT 31.4* 33.5* 30.2*  MCV 79.9 78.1 76.6*  MCH 21.6* 21.9* 21.8*  MCHC 27.1* 28.1* 28.5*  RDW 18.6* 18.6* 18.4*  PLT 250 217 202    Cardiac Enzymes Recent Labs  Lab 03/19/17 1627 03/19/17 2042 03/20/17 0246 03/20/17 0824  TROPONINI 0.10* 0.10* 0.11* 0.11*   No results for input(s): TROPIPOC in the last 168 hours.   BNP Recent Labs  Lab 03/19/17 1627  BNP 251.0*     DDimer No results for input(s): DDIMER in the last 168 hours.   Radiology    Dg Chest Port 1 View  Result Date: 03/19/2017 CLINICAL DATA:  Heart palpitations. Metastatic cancer. Previously treated breast cancer. EXAM: PORTABLE CHEST 1 VIEW COMPARISON:  01/18/2017.  PET-CT dated 02/06/2017. FINDINGS: Poor inspiration. Borderline enlarged cardiac silhouette. Increased prominence of the pulmonary vasculature and interstitial markings. No visible pleural fluid. Left axillary surgical clips. Right jugular porta catheter tip in the superior vena cava. Mottled appearance of the right humeral neck. No abnormal activity at that location on the previous PET-CT. IMPRESSION: 1. Poor inspiration with interval borderline cardiomegaly, pulmonary vascular congestion and probable mild interstitial pulmonary edema. 2. Possible interval right  humeral neck metastasis. Electronically Signed   By: Claudie Revering M.D.   On: 03/19/2017 17:19    Cardiac Studies   Echocardiogram (03/20/17)  - Left ventricle: The cavity size was normal. Wall thickness was   normal. Systolic function was vigorous. The estimated ejection   fraction was in the range of 65% to 70%. Wall motion was normal;   there were no regional wall motion abnormalities. Left   ventricular diastolic function parameters were normal. - Aortic valve: Trileaflet; mildly thickened, mildly calcified   leaflets. There was restricted movement of the noncoronary cusp.   There was mild stenosis. There was trivial regurgitation. Valve   area (VTI): 1.6 cm^2. Valve area (Vmax): 1.6 cm^2. Valve area   (Vmean): 1.68 cm^2. - Mitral valve: There was mild regurgitation. - Tricuspid valve: There was mild-moderate regurgitation. - Pulmonary arteries: PA peak pressure: 37 mm Hg (S).  Patient Profile     67 y.o. female with a history of stage IV non-small cell lung cancer with widely metastatic disease including lesions in the liver and has chronic pain due to this, left lower extremity DVT diagnosed in December 2017 and is on Xarelto, rheumatoid arthritis and is on Plaquenil, and a prior history of tobacco abuse hospitalized for rapid atrial fibrillation.  Assessment & Plan    1.  Rapid atrial fibrillation: She remains in sinus rhythm. She takes nebivolol at home.  I switched this to metoprolol 25 mg twice daily on 2/4.  In order to raise arrhythmia threshold, potassium should be maintained at a level greater than 4 (3.5 today). TSH is also mildly low.  I will provide additional supplemental potassium.   Echocardiogram reviewed above. Continue systemic anticoagulation with Xarelto 20 mg.  2.  Acute diastolic heart failure: She remains euvolemic after receiving 1 dose of IV Lasix  40 mg.  This was likely precipitated by rapid atrial fibrillation.  She can take Lasix 20 mg as needed in the  outpatient setting.  3.  Hypokalemia: K is low normal at 3.5. I will give another dose of 40 milliequivalents of potassium chloride.  This will help to suppress PVCs as well as atrial fibrillation.  4.  Chronic pain due to metastatic cancer: She is on pain medications.  5.  Left lower extremity DVT: Continue Xarelto.  6. Mild aortic stenosis: Stable.  7. Bilateral carotid artery stenosis: LICA is occluded and there was 50-69% RICA stenosis in July 2014, followed by neurology at that time. I will consider repeat Dopplers in outpatient setting but the utility of pursuing this will be determined based on overall prognosis.  Disposition: I will sign off inpatient care and make an outpatient follow up appt.   For questions or updates, please contact [facility] Please consult [email or web address] for contact info under Cardiology/STEMI.      Signed, Kate Sable, MD  03/21/2017, 9:54 AM

## 2017-03-21 NOTE — Progress Notes (Addendum)
PROGRESS NOTE                                                                                                                                                                                                             Patient Demographics:    [name], is a 67 y.o. female, DOB - 10/29/1950, CXK:[id number]  Admit date - 03/19/2017   Admitting Physician Pratik Darleen Crocker, DO  Outpatient Primary MD for the patient is Jani Gravel, MD  LOS - 2  Outpatient Specialists: oncology  Chief Complaint  Patient presents with  . Irregular Heartbeat       Brief Narrative   67 y.o. female with medical history significant for widely metastatic cancer-currently on palliative care for pain management, prior DVTs on Xarelto, diabetes, hypertension, RA, and depression, who was recently discharged from [facility] after being admitted for pain control sent by her hospice home nurse tachycardia and found to be in rapid Afib. Placed on amiodarone drip.    Subjective:   Still complains of pain but has not required frequent Dilaudid.   Assessment  & Plan :    Principal Problem:   New onset Atrial fibrillation with RVR (HCC) Off amiodarone drip. Echo with normal EF and no WMA. metoprolol BID. Hypokalemia replenished TSH low. On xarelto already for hx of DVT. Cardiology consult appreciated.  Active Problems: Metastatic malignancy (primary cancer of left upper lobe of lung) with chronic pain.  Adjusted pain meds.  Patient is getting radiation therapy at [facility] (10 sessions, has received getting radiation therapy at [facility]. D/w Dr carb who is seeing her  with rocking him hospice helping her with her pain medication.  (Only for pain management, she is not established with Currently on radiation therapy ( received 2/10 days of radiation for).  Patient being sent for radiation today and tomorrow and most likely will be discharged to SNF  tomorrow after returning back from radiation. Her recent liver biopsy shows adenocarcinoma.  As per her oncologist note from/ 1/29 patient would follow-up with them sometime next week (I believe it would be after completion of her radiation) to discuss her liver biopsy results and options of chemotherapy.  She is tolerating Ms. Contin 30 mg every 12 hours scheduled and as needed Dilaudid.  Hopefully further radiation will prove her pain.  Possibly can  send her to SNF with MS Contin and low-dose Dilaudid for breakthrough pain.   Recurrent left LE DVT on xarelto.  Rheumatoid arthritis   Continue on Plaquenil and steroids.  Type 2 diabetes, controlled.   Continue on home hypoglycemics with SSI and carb modified diet.  Depression.   Continue on Cymbalta.  Suppressed TSH (0.225) Check free  T4   Code Status : DNR  Family Communication  : husband at bedside  Disposition Plan  : possibly SNF tomorrow after radiation therapy  Barriers For Discharge : improving symptoms, pain control  Consults  :   Cardiology  oncology  Procedures  : echo  DVT Prophylaxis  :  xarelto  Lab Results  Component Value Date   PLT 202 03/20/2017    Antibiotics  :   Anti-infectives (From admission, onward)   None        Objective:   Vitals:   03/21/17 0700 03/21/17 0825 03/21/17 0927 03/21/17 1100  BP: (!) 145/51  (!) 148/40 (!) 143/70  Pulse:      Resp: 16   18  Temp:  98.2 F (36.8 C)    TempSrc:      SpO2:      Weight:      Height:        Wt Readings from Last 3 Encounters:  03/21/17 71.9 kg (158 lb 8.2 oz)  03/18/17 74.4 kg (164 lb 0.4 oz)  03/14/17 75.7 kg (166 lb 12.8 oz)     Intake/Output Summary (Last 24 hours) at 03/21/2017 1236 Last data filed at 03/20/2017 2100 Gross per 24 hour  Intake 463.64 ml  Output -  Net 463.64 ml     Physical Exam General: Elderly female not in distress HEENT: Pallor present, moist mucosa, supple neck Chest: Clear bilaterally CVS: S1  and S2 irregular, no murmurs GI: Soft, nondistended, nontender, bowel sounds present Musculoskeletal: Warm, limited mobility of the hip due to pain CNS: Alert and oriented    Data Review:    CBC Recent Labs  Lab 03/16/17 0431 03/17/17 0440 03/18/17 0500 03/19/17 1627 03/20/17 0246  WBC 14.4* 14.0* 11.8* 16.6* 13.2*  HGB 8.8* 8.1* 8.5* 9.4* 8.6*  HCT 31.3* 28.3* 31.4* 33.5* 30.2*  PLT 253 220 250 217 202  MCV 77.9* 78.0 79.9 78.1 76.6*  MCH 21.9* 22.3* 21.6* 21.9* 21.8*  MCHC 28.1* 28.6* 27.1* 28.1* 28.5*  RDW 18.7* 18.9* 18.6* 18.6* 18.4*  LYMPHSABS  --   --   --  1.2  --   MONOABS  --   --   --  1.2*  --   EOSABS  --   --   --  0.2  --   BASOSABS  --   --   --  0.0  --     Chemistries  Recent Labs  Lab 03/16/17 0416 03/17/17 0440 03/18/17 0500 03/19/17 1627 03/20/17 0246 03/21/17 0434  NA 141 143 143 137 137 136  K 3.8 3.7 4.0 3.7 2.9* 3.5  CL 105 109 107 99* 97* 98*  CO2 '27 27 29 24 26 27  '$ GLUCOSE 145* 98 123* 141* 124* 113*  BUN '16 18 18 18 15 13  '$ CREATININE 0.60 0.62 0.58 0.78 0.62 0.52  CALCIUM 8.8* 8.7* 9.4 9.4 9.0 9.2  MG  --   --   --  1.7  --   --   AST 23  --   --   --   --   --   ALT 58*  --   --   --   --   --  ALKPHOS 271*  --   --   --   --   --   BILITOT 0.7  --   --   --   --   --    ------------------------------------------------------------------------------------------------------------------ No results for input(s): CHOL, HDL, LDLCALC, TRIG, CHOLHDL, LDLDIRECT in the last 72 hours.  Lab Results  Component Value Date   HGBA1C 6.9 (H) 02/24/2017   ------------------------------------------------------------------------------------------------------------------ Recent Labs    03/19/17 2042  TSH 0.225*   ------------------------------------------------------------------------------------------------------------------ No results for input(s): VITAMINB12, FOLATE, FERRITIN, TIBC, IRON, RETICCTPCT in the last 72 hours.  Coagulation  profile Recent Labs  Lab 03/16/17 0416  INR 1.47    No results for input(s): DDIMER in the last 72 hours.  Cardiac Enzymes Recent Labs  Lab 03/19/17 2042 03/20/17 0246 03/20/17 0824  TROPONINI 0.10* 0.11* 0.11*   ------------------------------------------------------------------------------------------------------------------    Component Value Date/Time   BNP 251.0 (H) 03/19/2017 1627    Inpatient Medications  Scheduled Meds: . insulin aspart  0-15 Units Subcutaneous TID WC  . lidocaine  1 patch Transdermal Q24H  . metoprolol tartrate  25 mg Oral BID  . morphine  30 mg Oral Q12H  . polyethylene glycol  17 g Oral Daily  . potassium chloride  40 mEq Oral Once  . rivaroxaban  20 mg Oral Q supper  . senna-docusate  1 tablet Oral BID  . sodium chloride flush  3 mL Intravenous Q12H   Continuous Infusions: . sodium chloride     PRN Meds:.sodium chloride, HYDROmorphone (DILAUDID) injection, sodium chloride flush  Micro Results Recent Results (from the past 240 hour(s))  MRSA PCR Screening     Status: None   Collection Time: 03/19/17 10:00 PM  Result Value Ref Range Status   MRSA by PCR NEGATIVE NEGATIVE Final    Comment:        The GeneXpert MRSA Assay (FDA approved for NASAL specimens only), is one component of a comprehensive MRSA colonization surveillance program. It is not intended to diagnose MRSA infection nor to guide or monitor treatment for MRSA infections. Performed at [facility], [address], [place], [place] [address]     Radiology Reports Mr Virgel Paling UE Contrast  Result Date: 03/10/2017 CLINICAL DATA:  Stroke follow-up. History of aneurysm coiling. Weakness and difficulty walking. Cervical and breast cancer history. EXAM: MRA NECK WITHOUT CONTRAST MRA HEAD WITHOUT CONTRAST TECHNIQUE: Multiplanar and multiecho pulse sequences of the neck were obtained without intravenous contrast. Angiographic images of the neck were obtained using MRA  technique without intravenous contrast.; Angiographic images of the Circle of Willis were obtained using MRA technique without intravenous contrast. COMPARISON:  None. FINDINGS: MRA NECK FINDINGS Normal three-vessel aortic arch branching pattern. The visualized portions of the subclavian arteries are normal. Both vertebral artery origins are normal. The vertebral system is left dominant. Moderate narrowing of the left vertebral artery mid V2 segment (series 6, image 128). The remainder of the left vertebral artery course is normal. No focal stenosis of the right vertebral artery. There is moderate atherosclerotic plaque at the right carotid bifurcation causing approximately 50% narrowing of the distal right common carotid artery. No hemodynamically significant stenosis of the right internal carotid artery. Atherosclerotic plaque at the left carotid bifurcation extending into the left internal carotid artery results in critical stenosis of the left ICA with severe narrowing along the remainder of the cervical internal carotid artery course. This has worsened slightly relative to 05/25/2011. MRA HEAD FINDINGS Intracranial internal carotid arteries: Minimal flow related enhancement of  the left internal carotid artery to the ophthalmic segment. No flow related enhancement within the left carotid terminus. No right internal carotid artery stenosis. Status post coiling of right P-comm origin aneurysm. No residual aneurysm filling. Anterior cerebral arteries: Normal right anterior cerebral artery. There is no flow related enhancement within the left ACA A1 segment. Enhancement of the left A2 segment is via the anterior communicating artery. Middle cerebral arteries: Normal right MCA. No flow related enhancement in the left MCA M1 segment. Unchanged degree of collateralization in the distal left MCA distribution. Posterior communicating arteries: Patent on the left. Incomplete on the right. Status post coiling of right  P-comm origin aneurysm. Posterior cerebral arteries: Normal. Basilar artery: Normal. Vertebral arteries: Left dominant. Normal. Superior cerebellar arteries: Normal. Anterior inferior cerebellar arteries: Normal. Posterior inferior cerebellar arteries: Normal. IMPRESSION: 1. Critical stenosis of the left internal carotid artery, slightly worsened at the left ICA origin, but otherwise unchanged with minimal flow related enhancement to the level of the ophthalmic segment and no enhancement beyond. No right ICA stenosis. 2. Unchanged occlusion of the left anterior cerebral artery A 1 segment and left middle cerebral artery M 1 segment. 3. Unchanged moderate narrowing of the mid P 2 segment of the left vertebral artery. This may be exaggerated by changes in flow direction on this time-of-flight study. 4. Status post coiling of right P-comm origin aneurysm without residual aneurysm filling. Electronically Signed   By: Ulyses Jarred M.D.   On: 03/10/2017 14:27   Mr Jodene Nam Neck Wo Contrast  Result Date: 03/10/2017 CLINICAL DATA:  Stroke follow-up. History of aneurysm coiling. Weakness and difficulty walking. Cervical and breast cancer history. EXAM: MRA NECK WITHOUT CONTRAST MRA HEAD WITHOUT CONTRAST TECHNIQUE: Multiplanar and multiecho pulse sequences of the neck were obtained without intravenous contrast. Angiographic images of the neck were obtained using MRA technique without intravenous contrast.; Angiographic images of the Circle of Willis were obtained using MRA technique without intravenous contrast. COMPARISON:  None. FINDINGS: MRA NECK FINDINGS Normal three-vessel aortic arch branching pattern. The visualized portions of the subclavian arteries are normal. Both vertebral artery origins are normal. The vertebral system is left dominant. Moderate narrowing of the left vertebral artery mid V2 segment (series 6, image 128). The remainder of the left vertebral artery course is normal. No focal stenosis of the right  vertebral artery. There is moderate atherosclerotic plaque at the right carotid bifurcation causing approximately 50% narrowing of the distal right common carotid artery. No hemodynamically significant stenosis of the right internal carotid artery. Atherosclerotic plaque at the left carotid bifurcation extending into the left internal carotid artery results in critical stenosis of the left ICA with severe narrowing along the remainder of the cervical internal carotid artery course. This has worsened slightly relative to 05/25/2011. MRA HEAD FINDINGS Intracranial internal carotid arteries: Minimal flow related enhancement of the left internal carotid artery to the ophthalmic segment. No flow related enhancement within the left carotid terminus. No right internal carotid artery stenosis. Status post coiling of right P-comm origin aneurysm. No residual aneurysm filling. Anterior cerebral arteries: Normal right anterior cerebral artery. There is no flow related enhancement within the left ACA A1 segment. Enhancement of the left A2 segment is via the anterior communicating artery. Middle cerebral arteries: Normal right MCA. No flow related enhancement in the left MCA M1 segment. Unchanged degree of collateralization in the distal left MCA distribution. Posterior communicating arteries: Patent on the left. Incomplete on the right. Status post coiling of right P-comm origin aneurysm.  Posterior cerebral arteries: Normal. Basilar artery: Normal. Vertebral arteries: Left dominant. Normal. Superior cerebellar arteries: Normal. Anterior inferior cerebellar arteries: Normal. Posterior inferior cerebellar arteries: Normal. IMPRESSION: 1. Critical stenosis of the left internal carotid artery, slightly worsened at the left ICA origin, but otherwise unchanged with minimal flow related enhancement to the level of the ophthalmic segment and no enhancement beyond. No right ICA stenosis. 2. Unchanged occlusion of the left anterior  cerebral artery A 1 segment and left middle cerebral artery M 1 segment. 3. Unchanged moderate narrowing of the mid P 2 segment of the left vertebral artery. This may be exaggerated by changes in flow direction on this time-of-flight study. 4. Status post coiling of right P-comm origin aneurysm without residual aneurysm filling. Electronically Signed   By: Ulyses Jarred M.D.   On: 03/10/2017 14:27   Mr Jeri Cos HO Contrast  Result Date: 02/21/2017 CLINICAL DATA:  Breast cancer. Metastatic lung cancer. History of right posterior communicating artery aneurysm coiling. EXAM: MRI HEAD WITHOUT AND WITH CONTRAST TECHNIQUE: Multiplanar, multiecho pulse sequences of the brain and surrounding structures were obtained without and with intravenous contrast. CONTRAST:  15 mL MultiHance IV COMPARISON:  MRI head 01/30/2017, 05/25/2011 FINDINGS: Brain: New area of restricted diffusion head of caudate on the left compatible with acute/subacute infarct. Previously noted small areas of restricted diffusion in the right occipital lobe now shows ill-defined enhancement, most consistent with subacute infarct. Small areas of restricted diffusion left parietal cortex and left posterior temporal lobe have resolved. Findings most compatible with ischemia rather than metastatic disease. Chronic microvascular ischemic changes in the white matter and pons. Small chronic infarct left frontal lobe. Negative for hemorrhage. Postcontrast imaging degraded by motion. 6 mm enhancing lesion right occipital lobe has angular margins and most compatible with subacute enhancing infarct based on prior MRI and morphology. No other worrisome enhancing lesions. Vascular: Prior coiling of right posterior communicating artery aneurysm with susceptibility. Chronic occlusion left internal carotid artery Skull and upper cervical spine: C3 vertebral body lesion has progressed in the interval compatible with metastatic disease. No calvarial lesions.  Sinuses/Orbits: Negative Other: None IMPRESSION: When compared with the prior MRI of 01/30/2017, there is improvement in the areas of restricted diffusion in the left parietal and left temporal lobe compatible with infarction. There enhancement of the small lesion in the right occipital lobe most consistent with subacute infarction. There is a new area of subacute infarct in the head of caudate on the left. Chronic occlusion left internal carotid artery with chronic ischemic changes as above. No definite metastatic disease in the brain. Continued close followup is warranted. Progression of bone marrow lesion in the C3 vertebral body compatible with metastatic disease. Electronically Signed   By: Franchot Gallo M.D.   On: 02/21/2017 07:52   US Biopsy (liver)  Result Date: 03/16/2017 INDICATION: Metastatic lung cancer, hepatic mass EXAM: ULTRASOUND GUIDED CORE BIOPSY OF RIGHT HEPATIC MASS MEDICATIONS: 1% LIDOCAINE LOCAL ANESTHESIA/SEDATION: Versed 2.'0mg'$  IV; Fentanyl 119mg IV; Moderate Sedation Time:  11 MINUTES The patient was continuously monitored during the procedure by the interventional radiology nurse under my direct supervision. FLUOROSCOPY TIME:  Fluoroscopy Time: NONE. COMPLICATIONS: None immediate. PROCEDURE: The procedure, risks, benefits, and alternatives were explained to the patient. Questions regarding the procedure were encouraged and answered. The patient understands and consents to the procedure. Previous imaging reviewed. Patient positioned right anterior oblique. Preliminary ultrasound performed. The 3.6 cm hypoechoic solid lesion in the right hepatic dome was localized. Overlying skin marked. Under sterile  conditions and local anesthesia, a 17 gauge 11.8 cm access needle was advanced percutaneously into the lesion. Needle position confirmed with ultrasound. Images obtained for documentation. 3 18 gauge core biopsies obtained. Samples placed in formalin. Needle tract occluded with Gel-Foam.  Postprocedure imaging demonstrates no hemorrhage or hematoma. Patient tolerated the biopsy well. FINDINGS: Imaging confirms needle placed into the right hepatic dome lesion for core biopsy IMPRESSION: Successful ultrasound right hepatic dome mass 18 gauge core biopsy Electronically Signed   By: Jerilynn Mages.  Shick M.D.   On: 03/16/2017 13:03   Ir US Guide Vasc Access Right  Result Date: 03/08/2017 INDICATION: 67 year old with metastatic non-small cell lung cancer. Planning for chemotherapy. EXAM: FLUOROSCOPIC AND ULTRASOUND GUIDED PLACEMENT OF A SUBCUTANEOUS PORT COMPARISON:  None. MEDICATIONS: Ancef 2 g; The antibiotic was administered within an appropriate time interval prior to skin puncture. ANESTHESIA/SEDATION: Versed 2.0 mg IV; Fentanyl 100 mcg IV; Moderate Sedation Time:  36 minutes The patient was continuously monitored during the procedure by the interventional radiology nurse under my direct supervision. FLUOROSCOPY TIME:  18 seconds, 3 mGy COMPLICATIONS: None immediate. PROCEDURE: The procedure, risks, benefits, and alternatives were explained to the patient. Questions regarding the procedure were encouraged and answered. The patient understands and consents to the procedure. Patient was placed supine on the interventional table. Ultrasound confirmed a patent right internal jugular vein. The right chest and neck were cleaned with a skin antiseptic and a sterile drape was placed. Maximal barrier sterile technique was utilized including caps, mask, sterile gowns, sterile gloves, sterile drape, hand hygiene and skin antiseptic. The right neck was anesthetized with 1% lidocaine. Small incision was made in the right neck with a blade. Micropuncture set was placed in the right internal jugular vein with ultrasound guidance. The micropuncture wire was used for measurement purposes. The right chest was anesthetized with 1% lidocaine with epinephrine. #15 blade was used to make an incision and a subcutaneous port pocket  was formed. Schriever was assembled. Subcutaneous tunnel was formed with a stiff tunneling device. The port catheter was brought through the subcutaneous tunnel. The port was placed in the subcutaneous pocket and sutured in place. The micropuncture set was exchanged for a peel-away sheath. The catheter was placed through the peel-away sheath and the tip was positioned at the superior cavoatrial junction. Catheter placement was confirmed with fluoroscopy. The port was accessed and flushed with heparinized saline. The port pocket was closed using two layers of absorbable sutures and Dermabond. The vein skin site was closed using a single layer of absorbable suture and Dermabond. Sterile dressings were applied. Patient tolerated the procedure well without an immediate complication. Ultrasound and fluoroscopic images were taken and saved for this procedure. IMPRESSION: Placement of a subcutaneous port device. Electronically Signed   By: Markus Daft M.D.   On: 03/08/2017 16:54   Ct Biopsy  Result Date: 02/21/2017 INDICATION: Bone lesions EXAM: CT BIOPSY; CT BONE MARROW BIOPSY AND ASPIRATION MEDICATIONS: None. ANESTHESIA/SEDATION: Fentanyl 100 mcg IV; Versed 2 mg IV Moderate Sedation Time:  20 minutes The patient was continuously monitored during the procedure by the interventional radiology nurse under my direct supervision. FLUOROSCOPY TIME:  Fluoroscopy Time:  minutes  seconds ( mGy). COMPLICATIONS: None immediate. PROCEDURE: Informed written consent was obtained from the patient after a thorough discussion of the procedural risks, benefits and alternatives. All questions were addressed. Maximal Sterile Barrier Technique was utilized including caps, mask, sterile gowns, sterile gloves, sterile drape, hand hygiene and skin antiseptic. A timeout  was performed prior to the initiation of the procedure. Under CT guidance, a(n) 11 gauge guide needle was advanced into the right iliac bone. Aspirates and a core  were obtained. Subsequently, the 11 gauge needle was directed towards a sclerotic bone lesion in the right iliac bone and a final 11 gauge core was obtained. Post biopsy images demonstrate no hemorrhage. Patient tolerated the procedure well without complication. Vital sign monitoring by nursing staff during the procedure will continue as patient is in the special procedures unit for post procedure observation. The images document guide needle placement within the normal portion of the right iliac bone. Subsequent images demonstrate needle placement in the sclerotic lesion within the right iliac bone. Post biopsy images demonstrate no hemorrhage. FINDINGS: The images document guide needle placement within the normal portion of the right iliac bone. Subsequent images demonstrate needle placement in the sclerotic lesion within the right iliac bone. Post biopsy images demonstrate no hemorrhage. IMPRESSION: Successful right sclerotic bone lesion core biopsy. Successful right iliac bone marrow biopsy. Electronically Signed   By: Marybelle Killings M.D.   On: 02/21/2017 11:20   Ct Biopsy  Result Date: 02/21/2017 INDICATION: Bone lesions EXAM: CT BIOPSY; CT BONE MARROW BIOPSY AND ASPIRATION MEDICATIONS: None. ANESTHESIA/SEDATION: Fentanyl 100 mcg IV; Versed 2 mg IV Moderate Sedation Time:  20 minutes The patient was continuously monitored during the procedure by the interventional radiology nurse under my direct supervision. FLUOROSCOPY TIME:  Fluoroscopy Time:  minutes  seconds ( mGy). COMPLICATIONS: None immediate. PROCEDURE: Informed written consent was obtained from the patient after a thorough discussion of the procedural risks, benefits and alternatives. All questions were addressed. Maximal Sterile Barrier Technique was utilized including caps, mask, sterile gowns, sterile gloves, sterile drape, hand hygiene and skin antiseptic. A timeout was performed prior to the initiation of the procedure. Under CT guidance, a(n) 11  gauge guide needle was advanced into the right iliac bone. Aspirates and a core were obtained. Subsequently, the 11 gauge needle was directed towards a sclerotic bone lesion in the right iliac bone and a final 11 gauge core was obtained. Post biopsy images demonstrate no hemorrhage. Patient tolerated the procedure well without complication. Vital sign monitoring by nursing staff during the procedure will continue as patient is in the special procedures unit for post procedure observation. The images document guide needle placement within the normal portion of the right iliac bone. Subsequent images demonstrate needle placement in the sclerotic lesion within the right iliac bone. Post biopsy images demonstrate no hemorrhage. FINDINGS: The images document guide needle placement within the normal portion of the right iliac bone. Subsequent images demonstrate needle placement in the sclerotic lesion within the right iliac bone. Post biopsy images demonstrate no hemorrhage. IMPRESSION: Successful right sclerotic bone lesion core biopsy. Successful right iliac bone marrow biopsy. Electronically Signed   By: Marybelle Killings M.D.   On: 02/21/2017 11:20   Dg Chest Port 1 View  Result Date: 03/19/2017 CLINICAL DATA:  Heart palpitations. Metastatic cancer. Previously treated breast cancer. EXAM: PORTABLE CHEST 1 VIEW COMPARISON:  01/18/2017.  PET-CT dated 02/06/2017. FINDINGS: Poor inspiration. Borderline enlarged cardiac silhouette. Increased prominence of the pulmonary vasculature and interstitial markings. No visible pleural fluid. Left axillary surgical clips. Right jugular porta catheter tip in the superior vena cava. Mottled appearance of the right humeral neck. No abnormal activity at that location on the previous PET-CT. IMPRESSION: 1. Poor inspiration with interval borderline cardiomegaly, pulmonary vascular congestion and probable mild interstitial pulmonary edema. 2. Possible  interval right humeral neck metastasis.  Electronically Signed   By: Claudie Revering M.D.   On: 03/19/2017 17:19   Ct Bone Marrow Biopsy & Aspiration  Result Date: 02/21/2017 INDICATION: Bone lesions EXAM: CT BIOPSY; CT BONE MARROW BIOPSY AND ASPIRATION MEDICATIONS: None. ANESTHESIA/SEDATION: Fentanyl 100 mcg IV; Versed 2 mg IV Moderate Sedation Time:  20 minutes The patient was continuously monitored during the procedure by the interventional radiology nurse under my direct supervision. FLUOROSCOPY TIME:  Fluoroscopy Time:  minutes  seconds ( mGy). COMPLICATIONS: None immediate. PROCEDURE: Informed written consent was obtained from the patient after a thorough discussion of the procedural risks, benefits and alternatives. All questions were addressed. Maximal Sterile Barrier Technique was utilized including caps, mask, sterile gowns, sterile gloves, sterile drape, hand hygiene and skin antiseptic. A timeout was performed prior to the initiation of the procedure. Under CT guidance, a(n) 11 gauge guide needle was advanced into the right iliac bone. Aspirates and a core were obtained. Subsequently, the 11 gauge needle was directed towards a sclerotic bone lesion in the right iliac bone and a final 11 gauge core was obtained. Post biopsy images demonstrate no hemorrhage. Patient tolerated the procedure well without complication. Vital sign monitoring by nursing staff during the procedure will continue as patient is in the special procedures unit for post procedure observation. The images document guide needle placement within the normal portion of the right iliac bone. Subsequent images demonstrate needle placement in the sclerotic lesion within the right iliac bone. Post biopsy images demonstrate no hemorrhage. FINDINGS: The images document guide needle placement within the normal portion of the right iliac bone. Subsequent images demonstrate needle placement in the sclerotic lesion within the right iliac bone. Post biopsy images demonstrate no hemorrhage.  IMPRESSION: Successful right sclerotic bone lesion core biopsy. Successful right iliac bone marrow biopsy. Electronically Signed   By: Marybelle Killings M.D.   On: 02/21/2017 11:20   Ir Fluoro Guide Port Insertion Right  Result Date: 03/08/2017 INDICATION: 67 year old with metastatic non-small cell lung cancer. Planning for chemotherapy. EXAM: FLUOROSCOPIC AND ULTRASOUND GUIDED PLACEMENT OF A SUBCUTANEOUS PORT COMPARISON:  None. MEDICATIONS: Ancef 2 g; The antibiotic was administered within an appropriate time interval prior to skin puncture. ANESTHESIA/SEDATION: Versed 2.0 mg IV; Fentanyl 100 mcg IV; Moderate Sedation Time:  36 minutes The patient was continuously monitored during the procedure by the interventional radiology nurse under my direct supervision. FLUOROSCOPY TIME:  18 seconds, 3 mGy COMPLICATIONS: None immediate. PROCEDURE: The procedure, risks, benefits, and alternatives were explained to the patient. Questions regarding the procedure were encouraged and answered. The patient understands and consents to the procedure. Patient was placed supine on the interventional table. Ultrasound confirmed a patent right internal jugular vein. The right chest and neck were cleaned with a skin antiseptic and a sterile drape was placed. Maximal barrier sterile technique was utilized including caps, mask, sterile gowns, sterile gloves, sterile drape, hand hygiene and skin antiseptic. The right neck was anesthetized with 1% lidocaine. Small incision was made in the right neck with a blade. Micropuncture set was placed in the right internal jugular vein with ultrasound guidance. The micropuncture wire was used for measurement purposes. The right chest was anesthetized with 1% lidocaine with epinephrine. #15 blade was used to make an incision and a subcutaneous port pocket was formed. [place] was assembled. Subcutaneous tunnel was formed with a stiff tunneling device. The port catheter was brought through the  subcutaneous tunnel. The port was placed in  the subcutaneous pocket and sutured in place. The micropuncture set was exchanged for a peel-away sheath. The catheter was placed through the peel-away sheath and the tip was positioned at the superior cavoatrial junction. Catheter placement was confirmed with fluoroscopy. The port was accessed and flushed with heparinized saline. The port pocket was closed using two layers of absorbable sutures and Dermabond. The vein skin site was closed using a single layer of absorbable suture and Dermabond. Sterile dressings were applied. Patient tolerated the procedure well without an immediate complication. Ultrasound and fluoroscopic images were taken and saved for this procedure. IMPRESSION: Placement of a subcutaneous port device. Electronically Signed   By: Markus Daft M.D.   On: 03/08/2017 16:54   Vas [place] Lower Extremity Venous (dvt)  Result Date: 02/24/2017  Lower Venous Study Indication: Pain. Examination Guidelines: A complete evaluation includes B-mode imaging, spectral doppler, color doppler, and power doppler as needed of all accessible portions of each vessel. Bilateral testing is considered an integral part of a complete examination. Limited examinations for reoccurring indications may be performed as noted. The reflux portion of the exam is performed with the patient in reverse Trendelenburg.  Right Venous Findings: +---+---------------+---------+-----------+----------+-------+    CompressibilityPhasicitySpontaneityPropertiesSummary +---+---------------+---------+-----------+----------+-------+ CFVFull           Yes      Yes                          +---+---------------+---------+-----------+----------+-------+  Left Venous Findings: +---------+---------------+---------+-----------+----------+-------+          CompressibilityPhasicitySpontaneityPropertiesSummary +---------+---------------+---------+-----------+----------+-------+ CFV      Full            Yes      Yes                  Acute   +---------+---------------+---------+-----------+----------+-------+ FV Prox  Partial        Yes      Yes                  Acute   +---------+---------------+---------+-----------+----------+-------+ FV Mid   Partial        Yes      Yes                  Acute   +---------+---------------+---------+-----------+----------+-------+ FV DistalPartial        Yes      Yes                  Acute   +---------+---------------+---------+-----------+----------+-------+ PFV      Full                                                 +---------+---------------+---------+-----------+----------+-------+ POP      Partial        Yes      Yes                  Acute   +---------+---------------+---------+-----------+----------+-------+ PTV      Partial                                      Acute   +---------+---------------+---------+-----------+----------+-------+ PERO     Partial  Acute   +---------+---------------+---------+-----------+----------+-------+ GSV      Partial                                      Acute   +---------+---------------+---------+-----------+----------+-------+ SSV      Full                                                 +---------+---------------+---------+-----------+----------+-------+    Final Interpretation Right: No evidence of common femoral vein obstruction. Left: There is evidence of acute DVT in the Femoral vein, Popliteal vein, Posterior Tibial vein, Peroneal vein, and Gastrocnemius vein. There is no evidence of superficial venous thrombosis. No cystic structure found in the popliteal fossa.  *See table(s) above for measurements and observations.  Electronically signed by Servando Snare on 02/24/2017 at 9:31:02 PM.    Time Spent in minutes  25   Nishi Neiswonger M.D on 03/21/2017 at 12:36 PM  Between 7am to 7pm - Pager - [phone]  After 7pm go  to [email or web address] - password [email or web address]  [facility] -  Office  [phone]

## 2017-03-21 NOTE — Evaluation (Addendum)
Physical Therapy Evaluation Patient Details Name: [name] MRN: [id number] DOB: Jul 15, 1950 Today's Date: 03/21/2017   History of Present Illness  [name] is a 67yo white female who comes to [facility] after MD at home noting tachycardia and irregular HR. PMH: HTN, widespread mets (03/01/17) now undergoigning radiation, DVT on xarelto, multiple small CVA Left sided.  *Of note: 02/06/17 PET scan includes the following: "rounded lesion in the L4 vertebral body measuring 1.9 cm with SUV,max equal 6.7. Lesion in the manubrium with SUV max equal 7.1. Additional rib lesions are present. Pathologic fracture of the LEFT lateral rib (sixth rib).[...]Lesions in the posterior LEFT acetabulum and proximal bilateral proximal femour lesions at the level of the lesser trochanters."    Clinical Impression  Pt admitted with above diagnosis. Pt currently with functional limitations due to the deficits listed below (see "PT Problem List"). Upon entry, the patient is received semirecumbent in bed, husband present. The pt is awake and agreeable to participate. Pt reports that her pain related to new CA diagnosis has limited her ability to perform basic mobility at home over the last 3 weeks. Manual muscle testing screening reveals weakness in BUE, weaker on Right (dominant) arm. Pain limits testing of BLE. Functional mobility assessment attempted, but pt unable to perform much bed mobility without severe increase in pain, wil require total assist. Pt will benefit from skilled PT intervention to increase independence and safety with basic mobility in preparation for discharge to the venue listed below.       Follow Up Recommendations SNF    Equipment Recommendations  None recommended by PT    Recommendations for Other Services       Precautions / Restrictions Precautions Precautions: Fall Precaution Comments: bone mets  Restrictions Weight Bearing Restrictions: No      Mobility  Bed Mobility                General bed mobility comments: attempted lateral scooting and supine to sitting, but pain becomes unbearable with attempted movement.   Transfers                    Ambulation/Gait                Stairs            Wheelchair Mobility    Modified Rankin (Stroke Patients Only)       Balance                                             Pertinent Vitals/Pain Pain Assessment: 0-10 Pain Score: 6 (pain elevates to unbearable levels with mobility in bed ) Pain Location: multiple areas, most notably, Left hip pelvis area, Right knee,right anterior thigh Pain Intervention(s): Limited activity within patient's tolerance;Monitored during session    [place] expects to be discharged to:: Private residence Living Arrangements: Spouse/significant other Available Help at Discharge: Family;Available 24 hours/day Type of Home: House Home Access: Level entry     Home Layout: Laundry or work area in [place]: [name] - single point;Walker - 2 wheels;Wheelchair - manual;Shower seat Additional Comments: pt has been using WC for household mobility x3 weeks; required maxA for bed mobility and transfers.     Prior Function Level of Independence: Needs assistance   Gait / Transfers Assistance Needed: husband assists with xfers last month, max-totalA  ADL's / Homemaking Assistance Needed: husband assists with ADL last month, mod-maxA         Hand Dominance        Extremity/Trunk Assessment   Upper Extremity Assessment Upper Extremity Assessment: RUE deficits/detail;LUE deficits/detail RUE Deficits / Details: grossly 4-/5  LUE Deficits / Details: grossly 4+/5     Lower Extremity Assessment Lower Extremity Assessment: (most limited by pain )       Communication   Communication: No difficulties  Cognition Arousal/Alertness: Awake/alert Behavior During Therapy: WFL for tasks assessed/performed                                           General Comments      Exercises     Assessment/Plan    PT Assessment Patient needs continued PT services  PT Problem List Decreased strength;Decreased activity tolerance;Decreased mobility       PT Treatment Interventions Functional mobility training;Therapeutic exercise;Therapeutic activities;Patient/family education    PT Goals (Current goals can be found in the Care Plan section)  Acute Rehab PT Goals Patient Stated Goal: regain strength and independence to withstand radiation therapy  PT Goal Formulation: With patient Time For Goal Achievement: 04/04/17 Potential to Achieve Goals: Fair    Frequency Min 3X/week   Barriers to discharge Inaccessible home environment;Decreased caregiver support husband has medical problems that liit ability to provdie max-total assist for moblity.     Co-evaluation               AM-PAC PT "6 Clicks" Daily Activity  Outcome Measure Difficulty turning over in bed (including adjusting bedclothes, sheets and blankets)?: Unable Difficulty moving from lying on back to sitting on the side of the bed? : Unable Difficulty sitting down on and standing up from a chair with arms (e.g., wheelchair, bedside commode, etc,.)?: Unable Help needed moving to and from a bed to chair (including a wheelchair)?: Total Help needed walking in hospital room?: Total Help needed climbing 3-5 steps with a railing? : Total 6 Click Score: 6    End of Session Equipment Utilized During Treatment: Oxygen Activity Tolerance: Patient limited by pain Patient left: in bed;with call bell/phone within reach;with bed alarm set;with family/visitor present   PT Visit Diagnosis: Muscle weakness (generalized) (M62.81);Difficulty in walking, not elsewhere classified (R26.2)    Time: 1041-1101 PT Time Calculation (min) (ACUTE ONLY): 20 min   Charges:   PT Evaluation $PT Eval Moderate Complexity: 1 Mod     PT G  Codes:        11:52 AM, 03-22-17 [name], PT, DPT Physical Therapist - [place] [phone] (Office)    [name] C 2017-03-22, 11:49 AM

## 2017-03-21 NOTE — Clinical Social Work Note (Signed)
Clinical Social Work Assessment  Patient Details  Name: [name] MRN: [id number] Date of Birth: 20-Apr-1950  Date of referral:  03/21/17               Reason for consult:  Facility Placement                Permission sought to share information with:  Facility Art therapist granted to share information::  Yes, Verbal Permission Granted  Name::        Agency::  [facility]  Relationship::     Contact Information:     Housing/Transportation Living arrangements for the past 2 months:  Single Family Home Source of Information:  Patient, Spouse Patient Interpreter Needed:  None Criminal Activity/Legal Involvement Pertinent to Current Situation/Hospitalization:    Significant Relationships:  Spouse Lives with:  Spouse Do you feel safe going back to the place where you live?  Yes Need for family participation in patient care:  Yes (Comment)  Care giving concerns: Pt and her husband feel that pt needs short term SNF rehab.   Social Worker assessment / plan: Pt is a 67 year old female referred to CSW for SNF placement. PT orders entered and pt scheduled for evaluation today. Met with pt and her husband this AM to assess. Pt resides with her husband in their single family home in [place]. Pt has a history of cancer and she is scheduled to receive radiation therapy at [facility] all week with first treatment being today. Pt will return to [facility] after radiation. Discussed possible SNF placement with pt and her husband. They are hoping for pt to go to [facility] for the rehab. Husband states that they are expecting [facility] would arrange transport for the radiation. Provided pt and family with the SNF list in the area of [place] in the event that [facility] cannot accept pt.  Will send referral to [facility] and will continue to follow. MD is anticipating dc tomorrow.  Employment status:  Retired Forensic scientist:  Medicare PT Recommendations:  Not assessed at this time Information / Referral  to community resources:     Patient/Family's Response to care: Pt and family accepting of care.  Patient/Family's Understanding of and Emotional Response to Diagnosis, Current Treatment, and Prognosis: Pt and her husband seem to have a good understanding of diagnosis and treatment plan. No emotional distress identified.  Emotional Assessment Appearance:  Appears stated age Attitude/Demeanor/Rapport:    Affect (typically observed):  Accepting, Calm, Pleasant Orientation:  Oriented to Self, Oriented to Place, Oriented to  Time, Oriented to Situation Alcohol / Substance use:  Not Applicable Psych involvement (Current and /or in the community):  No (Comment)  Discharge Needs  Concerns to be addressed:  Discharge Planning Concerns Readmission within the last 30 days:  No Current discharge risk:  None Barriers to Discharge:  No Barriers Identified   Shade Flood, LCSW 03/21/2017, 10:54 AM

## 2017-03-22 ENCOUNTER — Ambulatory Visit
Admission: RE | Admit: 2017-03-22 | Discharge: 2017-03-22 | Disposition: A | Discharge status: Home / Self Care | Payer: Medicare Other | Source: Ambulatory Visit | Attending: Radiation Oncology | Admitting: Radiation Oncology

## 2017-03-22 DIAGNOSIS — C3412 Malignant neoplasm of upper lobe, left bronchus or lung: Secondary | ICD-10-CM

## 2017-03-22 DIAGNOSIS — K219 Gastro-esophageal reflux disease without esophagitis: Secondary | ICD-10-CM

## 2017-03-22 DIAGNOSIS — E118 Type 2 diabetes mellitus with unspecified complications: Secondary | ICD-10-CM

## 2017-03-22 DIAGNOSIS — C7951 Secondary malignant neoplasm of bone: Secondary | ICD-10-CM | POA: Diagnosis not present

## 2017-03-22 LAB — BASIC METABOLIC PANEL
ANION GAP: 14 (ref 5–15)
BUN: 9 mg/dL (ref 6–20)
CALCIUM: 9.8 mg/dL (ref 8.9–10.3)
CHLORIDE: 99 mmol/L — AB (ref 101–111)
CO2: 24 mmol/L (ref 22–32)
CREATININE: 0.57 mg/dL (ref 0.44–1.00)
GFR calc non Af Amer: >60 mL/min (ref >60)
Glucose, Bld: 98 mg/dL (ref 65–99)
Potassium: 4 mmol/L (ref 3.5–5.1)
SODIUM: 137 mmol/L (ref 135–145)

## 2017-03-22 LAB — GLUCOSE, CAPILLARY
GLUCOSE-CAPILLARY: 90 mg/dL (ref 65–99)
GLUCOSE-CAPILLARY: 86 mg/dL (ref 65–99)
GLUCOSE-CAPILLARY: 141 mg/dL — AB (ref 65–99)
Glucose-Capillary: 141 mg/dL — ABNORMAL HIGH (ref 65–99)

## 2017-03-22 MED ORDER — METOPROLOL TARTRATE 25 MG PO TABS: 25.0000 mg | ORAL_TABLET | Freq: Two times a day (BID) | ORAL | 1 refills | Status: DC

## 2017-03-22 MED ORDER — DOCUSATE SODIUM 100 MG PO CAPS: 100.0000 mg | ORAL_CAPSULE | Freq: Two times a day (BID) | ORAL | Status: AC

## 2017-03-22 MED ORDER — HYDROMORPHONE HCL 2 MG PO TABS: 2.0000 mg | ORAL_TABLET | Freq: Four times a day (QID) | ORAL | 0 refills | Status: DC | PRN

## 2017-03-22 MED ORDER — LORAZEPAM 0.5 MG PO TABS: 0.5000 mg | ORAL_TABLET | Freq: Four times a day (QID) | ORAL | 0 refills | Status: DC | PRN

## 2017-03-22 MED ORDER — MORPHINE SULFATE ER 30 MG PO TBCR: 30.0000 mg | EXTENDED_RELEASE_TABLET | ORAL | 0 refills | Status: DC

## 2017-03-22 MED ORDER — POLYETHYLENE GLYCOL 3350 17 G PO PACK: 17.0000 g | PACK | Freq: Every day | ORAL | Status: AC

## 2017-03-22 NOTE — Clinical Social Work Note (Signed)
LCSW following. Pt is accepted at [facility] and can admit today if PASARR number received.  Updated pt and her husband this AM.   Ambrose Pancoast, LCSW, will follow up this afternoon to assist with pt's dc.

## 2017-03-22 NOTE — NC FL2 (Signed)
[place] LEVEL OF CARE SCREENING TOOL     IDENTIFICATION  Patient Name: [name] Birthdate: [birth year] Sex: female Admission Date (Current Location): 03/19/2017  [facility] and [place] Number:  [employer] and Address:  [place] [address] [place]      Provider Number: [id number]  Attending Physician Name and Address:  Barton Dubois, MD  Relative Name and Phone Number:       Current Level of Care: Hospital Recommended Level of Care: [place] Prior Approval Number:    Date Approved/Denied:   PASRR Number:    Discharge Plan: SNF    Current Diagnoses: Patient Active Problem List   Diagnosis Date Noted  . Diabetes ([place]) 03/19/2017  . Atrial fibrillation with RVR ([place]) 03/19/2017  . DVT (deep venous thrombosis) ([place]) 03/19/2017  . New onset a-fib ([place]) 03/19/2017  . Pain 03/15/2017  . Bone metastasis ([place]) 03/13/2017  . Palliative care by specialist   . DNR (do not resuscitate)   . Primary cancer of left upper lobe of lung ([place]) 02/27/2017  . Secondary malignant neoplasm of brain and spinal cord ([place]) 02/15/2017  . Venous insufficiency   . Venous intermittent claudication   . Diverticulitis of colon (without mention of hemorrhage)(562.11) 08/26/2013  . Postoperative anemia due to acute blood loss 02/26/2013  . Anemia, unspecified 02/26/2013  . DJD (degenerative joint disease) of knee 02/25/2013  . PONV (postoperative nausea and vomiting)   . Stroke ([place])   . Hypercholesterolemia   . Metastatic cancer ([place])   . Colon polyps   . Arthritis   . Rheumatoid arthritis with rheumatoid factor (HCC)   . Right knee DJD   . Asthenia 08/14/2012  . Cerebral aneurysm, nonruptured 08/14/2012  . TIA (transient ischemic attack) 08/14/2012  . RA (rheumatoid arthritis) ([place]) 10/27/2011  . High cholesterol 10/27/2011  . Hypertension 10/27/2011  . GERD (gastroesophageal reflux disease) 10/27/2011     Orientation RESPIRATION BLADDER Height & Weight     Self, Time, Situation, Place  O2 Continent Weight: 157 lb 6.5 oz (71.4 kg) Height:  5\' 5"  (165.1 cm)  BEHAVIORAL SYMPTOMS/MOOD NEUROLOGICAL BOWEL NUTRITION STATUS      Continent    AMBULATORY STATUS COMMUNICATION OF NEEDS Skin   Extensive Assist Verbally Normal                       Personal Care Assistance Level of Assistance  Bathing, Feeding, Dressing Bathing Assistance: Maximum assistance Feeding assistance: Limited assistance Dressing Assistance: Maximum assistance     Functional Limitations Info  Sight, Hearing, Speech Sight Info: Adequate Hearing Info: Adequate Speech Info: Adequate    SPECIAL CARE FACTORS FREQUENCY  PT (By licensed PT)     PT Frequency: 5 times week              Contractures Contractures Info: Not present    Additional Factors Info  Code Status, Allergies, Psychotropic Code Status Info: DNR Allergies Info: Aurotihoglucose, feldene, hydralazine, Imdur, Umuran, Methotrexate Derivitives, Calcium Channel blockers, Sulfa antibiotics, Thimerosal, Vioxx, Arava, Asacol, Lohexol, Hydrocodone, Latex, neomycin, Nickel, Nsaids, Percocet,  Psychotropic Info: Cymbalta, Ativan         Current Medications (03/22/2017):  This is the current hospital active medication list Current Facility-Administered Medications  Medication Dose Route Frequency Provider Last Rate Last Dose  . 0.9 %  sodium chloride infusion  250 mL Intravenous PRN Manuella Ghazi, Pratik D, DO      .  HYDROmorphone (DILAUDID) injection 2 mg  2 mg Intravenous Q3H PRN Dhungel, Nishant, MD   2 mg at 03/22/17 0345  . insulin aspart (novoLOG) injection 0-15 Units  0-15 Units Subcutaneous TID WC Shah, Pratik D, DO   2 Units at 03/20/17 1411  . lidocaine (LIDODERM) 5 % 1 patch  1 patch Transdermal Q24H Dhungel, Nishant, MD   1 patch at 03/21/17 1808  . metoprolol tartrate (LOPRESSOR) tablet 25 mg  25 mg Oral BID Herminio Commons, MD   25 mg at  03/22/17 0946  . morphine (MS CONTIN) 12 hr tablet 30 mg  30 mg Oral Q12H Bodenheimer, Charles A, NP   30 mg at 03/22/17 0946  . polyethylene glycol (MIRALAX / GLYCOLAX) packet 17 g  17 g Oral Daily Dhungel, Nishant, MD   17 g at 03/22/17 0946  . potassium chloride 20 MEQ/15ML (10%) solution 40 mEq  40 mEq Oral Once Herminio Commons, MD      . rivaroxaban (XARELTO) tablet 20 mg  20 mg Oral Q supper Herminio Commons, MD   20 mg at 03/21/17 1807  . senna-docusate (Senokot-S) tablet 1 tablet  1 tablet Oral BID Dhungel, Nishant, MD   1 tablet at 03/22/17 0946  . sodium chloride flush (NS) 0.9 % injection 3 mL  3 mL Intravenous Q12H Shah, Pratik D, DO   3 mL at 03/22/17 0946  . sodium chloride flush (NS) 0.9 % injection 3 mL  3 mL Intravenous PRN Heath Lark D, DO         Discharge Medications: Please see discharge summary for a list of discharge medications.  Relevant Imaging Results:  Relevant Lab Results:   Additional Information    Shade Flood, LCSW

## 2017-03-22 NOTE — Discharge Summary (Addendum)
Physician Discharge Summary  Monica HANCOX VPX:[id number] DOB: 1950/12/21 DOA: 03/19/2017  PCP: Jani Gravel, MD  Admit date: 03/19/2017 Discharge date: 03/23/2017  Time spent: 35 minutes  Recommendations for Outpatient Follow-up:  1-Repeat basic metabolic panel and magnesium level, to follow electrolytes and renal function. 2-make sure patient has follow with her oncology and cardiology as instructed.  Discharge Diagnoses:  Principal Problem:   Atrial fibrillation with RVR ([place]) Active Problems:   RA (rheumatoid arthritis) ([place])   Metastatic cancer ([place])   Palliative care by specialist   DNR (do not resuscitate)   Diabetes ([place])   DVT (deep venous thrombosis) ([place])   New onset a-fib ([place])   Discharge Condition: Stable.  Patient discharged to skilled nursing facility for further care and rehabilitation.  Outpatient follow-up with her oncologist and with cardiology service has been emphasized.  Diet recommendation: Heart healthy and modified carbohydrates.  Filed Weights   03/20/17 0500 03/21/17 0500 03/22/17 0634  Weight: 72.1 kg (158 lb 15.2 oz) 71.9 kg (158 lb 8.2 oz) 71.4 kg (157 lb 6.5 oz)    History of present illness:  As per H&P dictated by Dr. Manuella Ghazi on 03/19/17 67 y.o. female with medical history significant for widely metastatic cancer-currently on palliative care for pain management, prior DVTs on Xarelto, diabetes, hypertension, RA, and depression, who was recently discharged from [facility] after being admitted for pain control.  She is being visited by her hospice physician who noted that she was quite tachycardic with an irregular heartbeat at home.  The patient was otherwise asymptomatic and denied any chest pain, palpitations, dyspnea, nausea, vomiting, diaphoresis or other symptomatology.    ED Course: Atrial fibrillation with RVR noted on EKG and chest x-ray demonstrates mild interstitial edema and cardiomegaly.  Lab work demonstrated BNP 251 and troponin of  0.10.  She remains asymptomatic and was given metoprolol push with transient improvement in tachycardia, but this has elevated once again.  ED physician Dr. Thurnell Garbe has spoken with Dr. Sung Amabile at [facility] who recommends admitting here and placing on amiodarone drip for rate control with cardiology evaluation in a.m.  She will also be given 1 dose of IV Lasix while here.  She continues to remain asymptomatic.   Hospital Course:  1-new onset atrial fibrillation with RVR -2D echo demonstrated normal ejection fraction and no wall motion abnormality. -Patient now with rate control of amiodarone drip. -Continue metoprolol twice a day; dose adjusted to 50 mg BID to better control her HR. -Chronically already on Xarelto as part of treatment for recurrent DVTs -Outpatient follow-up with cardiology service Close follow-up the patient's electrolyte with constant repletion as needed (especially potassium and magnesium).  2-metastatic Lung Malignancy: Primary cancer in her left upper lobe radiated to the liver and right lungs. -Patient with chronic intractable pain associated with her malignancy. -Continue MS Contin 30 mg every 8 hours and oral Dilaudid for breakthrough pain -Patient actively seen by Dr. Clementeen Graham with rocking and husband to continue pain management. -continue radiation therapy as instructed and outpatient follow up with oncology service   3-history of recurrent left lower extremity DVT -Continue Xarelto  4-rheumatoid arthritis -Continue Plaquenil and steroids  5-type 2 diabetes controlled on oral medication -Continue modified carbohydrate diet and resumption of oral hypoglycemic regimen.  6-depression and anxiety -Continue Cymbalta and as needed lorazepam  7-GERD and GI prophylaxis -Continue PPI  Procedures:  See below for x-ray reports  2D echo  Consultations:  Cardiology and oncology service  Discharge Exam:  Vitals:   03/21/17 2228 03/22/17 0634  BP: (!) 122/59  120/61  Pulse: 90 87  Resp: 18 18  Temp: 98.2 F (36.8 C) 98.2 F (36.8 C)  SpO2: 97% 97%    General: Still complaining of pain in her mid abdomen and right upper quadrant; no nausea, no vomiting.  Reports that she is tolerating diet is slightly better. Cardiovascular: Rate control, no rubs, positive systolic ejection murmur, no JVD. Respiratory: Good air movement bilaterally, no wheezing, no crackles Abdomen: No guarding, positive bowel sounds, mild vague discomfort with deep palpation. Extremities: No cyanosis, no clubbing, positive pedal edema bilaterally.  Discharge Instructions   Discharge Instructions    Diet - low sodium heart healthy   Complete by:  As directed    Discharge instructions   Complete by:  As directed    Take medications as prescribed Maintain adequate hydration Physical rehabilitation as per nursing facility protocol Outpatient follow-up with oncologist Continue active radiation therapy.     Allergies as of 03/23/2017      Reactions   Aurothioglucose [solganal] Other (See Comments)   REACTION: VASCULITIS   Feldene [piroxicam] Anaphylaxis, Other (See Comments)   SKIN BLISTERS   Hydralazine Other (See Comments)   REACTION: Chest pains and headaches: IV only   Imdur [isosorbide Mononitrate] Other (See Comments)   REACTION: PATIENT IS UNABLE TO WALK OR FUNCTION   Imuran [azathioprine] Other (See Comments)   Could not walk or function   Methotrexate Derivatives Other (See Comments)   REACTION: VASCULITIS   Calcium Channel Blockers Palpitations   Sulfa Antibiotics Diarrhea, Nausea And Vomiting   Thimerosal Other (See Comments)   SKIN BLISTERED   Vioxx [rofecoxib] Other (See Comments)   REACTION: CAUSED MORE JOINT PAIN   Arava [leflunomide]    UNSPECIFIED REACTION    Asacol [mesalamine]    UNSPECIFIED REACTION    Iohexol Other (See Comments)   PT STATES SHE WAS TOLD NEVER TO HAVE CT CONTRAST AGAIN.  SHE HAD SOME KIND OF REACTION BUT DOESNT REMEBER  WHAT HAPPENED   Hydrocodone Itching   Latex Rash   Neomycin Rash   Nickel Rash   Nsaids Other (See Comments)   GI UPSET , CAN TOLERATE SOME NSAIDS   Percocet [oxycodone-acetaminophen] Other (See Comments)   INSOMNIA      Medication List    STOP taking these medications   BYSTOLIC 5 MG tablet Generic drug:  nebivolol   FIBER SELECT GUMMIES PO     TAKE these medications   acetaminophen 325 MG tablet Commonly known as:  TYLENOL Take 2 tablets (650 mg total) by mouth every 6 (six) hours as needed for mild pain (or Fever >/= 101).   allopurinol 300 MG tablet Commonly known as:  ZYLOPRIM Take 300 mg by mouth daily.   atorvastatin 20 MG tablet Commonly known as:  LIPITOR Take 20 mg by mouth at bedtime.   CALTRATE 600+D PLUS 600-400 MG-UNIT per tablet Chew 1 tablet by mouth 2 (two) times daily.   docusate sodium 100 MG capsule Commonly known as:  COLACE Take 1 capsule (100 mg total) by mouth 2 (two) times daily.   DULoxetine 60 MG capsule Commonly known as:  CYMBALTA Take 60 mg by mouth daily.   folic acid 1 MG tablet Commonly known as:  FOLVITE Take 1 tablet (1 mg total) by mouth daily. Start 5-7 days before Alimta chemotherapy. Continue until 21 days after Alimta completed.   HYDROmorphone 2 MG tablet Commonly known as:  DILAUDID  Take 1-2 tablets (2-4 mg total) by mouth every 6 (six) hours as needed for severe pain (breakthrough pain).   hydroxychloroquine 200 MG tablet Commonly known as:  PLAQUENIL Take 200 mg by mouth daily.   ipratropium 0.06 % nasal spray Commonly known as:  ATROVENT Place 2 sprays into both nostrils 2 (two) times daily as needed for rhinitis.   lidocaine-prilocaine cream Commonly known as:  EMLA Apply to affected area once   LORazepam 0.5 MG tablet Commonly known as:  ATIVAN Take 1 tablet (0.5 mg total) by mouth every 6 (six) hours as needed for anxiety (Nausea or vomiting). What changed:  reasons to take this   MAGNESIUM OXIDE  PO Take 500 mg by mouth daily.   metFORMIN 500 MG 24 hr tablet Commonly known as:  GLUCOPHAGE-XR Take 500 mg by mouth 2 (two) times a week. No specific days   methylPREDNISolone 8 MG tablet Commonly known as:  MEDROL Take 8 mg by mouth daily.   metoprolol tartrate 25 MG tablet Commonly known as:  LOPRESSOR Take 2 tablets (50 mg total) by mouth 2 (two) times daily.   morphine 30 MG 12 hr tablet Commonly known as:  MS CONTIN Take 1 tablet (30 mg total) by mouth 3 (three) times daily at 8am, 2pm and bedtime. Administered at 8a, 2pm, and 9pm What changed:  Another medication with the same name was removed. Continue taking this medication, and follow the directions you see here.   omeprazole 40 MG capsule Commonly known as:  PRILOSEC TAKE (1) CAPSULE BY MOUTH ONCE DAILY. What changed:  See the new instructions.   ondansetron 8 MG tablet Commonly known as:  ZOFRAN Take 1 tablet (8 mg total) by mouth 2 (two) times daily as needed for refractory nausea / vomiting. Start on day 3 after chemo.   polyethylene glycol packet Commonly known as:  MIRALAX Take 17 g by mouth daily.   prochlorperazine 10 MG tablet Commonly known as:  COMPAZINE Take 1 tablet (10 mg total) by mouth every 6 (six) hours as needed (Nausea or vomiting).   SYSTANE 0.4-0.3 % Gel ophthalmic gel Generic drug:  Polyethyl Glycol-Propyl Glycol Apply 1 application to eye daily as needed (for dry eyes).   Vitamin D 2000 units tablet Take 2,000 Units by mouth daily.   XARELTO 20 MG Tabs tablet Generic drug:  rivaroxaban Take 1 tablet by mouth daily.      Allergies  Allergen Reactions  . Aurothioglucose [Solganal] Other (See Comments)    REACTION: VASCULITIS   . Feldene [Piroxicam] Anaphylaxis and Other (See Comments)    SKIN BLISTERS  . Hydralazine Other (See Comments)    REACTION: Chest pains and headaches: IV only  . Imdur [Isosorbide Mononitrate] Other (See Comments)    REACTION: PATIENT IS UNABLE TO  WALK OR FUNCTION  . Imuran [Azathioprine] Other (See Comments)    Could not walk or function  . Methotrexate Derivatives Other (See Comments)    REACTION: VASCULITIS  . Calcium Channel Blockers Palpitations  . Sulfa Antibiotics Diarrhea and Nausea And Vomiting  . Thimerosal Other (See Comments)    SKIN BLISTERED  . Vioxx [Rofecoxib] Other (See Comments)    REACTION: CAUSED MORE JOINT PAIN  . Arava [Leflunomide]     UNSPECIFIED REACTION   . Asacol [Mesalamine]     UNSPECIFIED REACTION   . Iohexol Other (See Comments)    PT STATES SHE WAS TOLD NEVER TO HAVE CT CONTRAST AGAIN.  SHE HAD SOME KIND OF REACTION  BUT DOESNT REMEBER WHAT HAPPENED   . Hydrocodone Itching  . Latex Rash  . Neomycin Rash  . Nickel Rash  . Nsaids Other (See Comments)    GI UPSET , CAN TOLERATE SOME NSAIDS  . Percocet [Oxycodone-Acetaminophen] Other (See Comments)    INSOMNIA    Contact information for follow-up providers    Strader, Fransisco Hertz, PA-C. Go on 04/11/2017.   Specialties:  Physician Assistant, Cardiology Why:  1:00 pm  Contact information: [address] [place]  [address] [phone]            Contact information for after-discharge care    [facility] Follow up.   Service:  Skilled Nursing Contact information: [address] [phone]                  The results of significant diagnostics from this hospitalization (including imaging, microbiology, ancillary and laboratory) are listed below for reference.    Significant Diagnostic Studies: Mr Virgel Paling GG Contrast  Result Date: 03/10/2017 CLINICAL DATA:  Stroke follow-up. History of aneurysm coiling. Weakness and difficulty walking. Cervical and breast cancer history. EXAM: MRA NECK WITHOUT CONTRAST MRA HEAD WITHOUT CONTRAST TECHNIQUE: Multiplanar and multiecho pulse sequences of the neck were obtained without intravenous contrast. Angiographic images  of the neck were obtained using MRA technique without intravenous contrast.; Angiographic images of the Circle of Willis were obtained using MRA technique without intravenous contrast. COMPARISON:  None. FINDINGS: MRA NECK FINDINGS Normal three-vessel aortic arch branching pattern. The visualized portions of the subclavian arteries are normal. Both vertebral artery origins are normal. The vertebral system is left dominant. Moderate narrowing of the left vertebral artery mid V2 segment (series 6, image 128). The remainder of the left vertebral artery course is normal. No focal stenosis of the right vertebral artery. There is moderate atherosclerotic plaque at the right carotid bifurcation causing approximately 50% narrowing of the distal right common carotid artery. No hemodynamically significant stenosis of the right internal carotid artery. Atherosclerotic plaque at the left carotid bifurcation extending into the left internal carotid artery results in critical stenosis of the left ICA with severe narrowing along the remainder of the cervical internal carotid artery course. This has worsened slightly relative to 05/25/2011. MRA HEAD FINDINGS Intracranial internal carotid arteries: Minimal flow related enhancement of the left internal carotid artery to the ophthalmic segment. No flow related enhancement within the left carotid terminus. No right internal carotid artery stenosis. Status post coiling of right P-comm origin aneurysm. No residual aneurysm filling. Anterior cerebral arteries: Normal right anterior cerebral artery. There is no flow related enhancement within the left ACA A1 segment. Enhancement of the left A2 segment is via the anterior communicating artery. Middle cerebral arteries: Normal right MCA. No flow related enhancement in the left MCA M1 segment. Unchanged degree of collateralization in the distal left MCA distribution. Posterior communicating arteries: Patent on the left. Incomplete on the  right. Status post coiling of right P-comm origin aneurysm. Posterior cerebral arteries: Normal. Basilar artery: Normal. Vertebral arteries: Left dominant. Normal. Superior cerebellar arteries: Normal. Anterior inferior cerebellar arteries: Normal. Posterior inferior cerebellar arteries: Normal. IMPRESSION: 1. Critical stenosis of the left internal carotid artery, slightly worsened at the left ICA origin, but otherwise unchanged with minimal flow related enhancement to the level of the ophthalmic segment and no enhancement beyond. No right ICA stenosis. 2. Unchanged occlusion of the left anterior cerebral artery A 1 segment and  left middle cerebral artery M 1 segment. 3. Unchanged moderate narrowing of the mid P 2 segment of the left vertebral artery. This may be exaggerated by changes in flow direction on this time-of-flight study. 4. Status post coiling of right P-comm origin aneurysm without residual aneurysm filling. Electronically Signed   By: Ulyses Jarred M.D.   On: 03/10/2017 14:27   Mr Jodene Nam Neck Wo Contrast  Result Date: 03/10/2017 CLINICAL DATA:  Stroke follow-up. History of aneurysm coiling. Weakness and difficulty walking. Cervical and breast cancer history. EXAM: MRA NECK WITHOUT CONTRAST MRA HEAD WITHOUT CONTRAST TECHNIQUE: Multiplanar and multiecho pulse sequences of the neck were obtained without intravenous contrast. Angiographic images of the neck were obtained using MRA technique without intravenous contrast.; Angiographic images of the Circle of Willis were obtained using MRA technique without intravenous contrast. COMPARISON:  None. FINDINGS: MRA NECK FINDINGS Normal three-vessel aortic arch branching pattern. The visualized portions of the subclavian arteries are normal. Both vertebral artery origins are normal. The vertebral system is left dominant. Moderate narrowing of the left vertebral artery mid V2 segment (series 6, image 128). The remainder of the left vertebral artery course is  normal. No focal stenosis of the right vertebral artery. There is moderate atherosclerotic plaque at the right carotid bifurcation causing approximately 50% narrowing of the distal right common carotid artery. No hemodynamically significant stenosis of the right internal carotid artery. Atherosclerotic plaque at the left carotid bifurcation extending into the left internal carotid artery results in critical stenosis of the left ICA with severe narrowing along the remainder of the cervical internal carotid artery course. This has worsened slightly relative to 05/25/2011. MRA HEAD FINDINGS Intracranial internal carotid arteries: Minimal flow related enhancement of the left internal carotid artery to the ophthalmic segment. No flow related enhancement within the left carotid terminus. No right internal carotid artery stenosis. Status post coiling of right P-comm origin aneurysm. No residual aneurysm filling. Anterior cerebral arteries: Normal right anterior cerebral artery. There is no flow related enhancement within the left ACA A1 segment. Enhancement of the left A2 segment is via the anterior communicating artery. Middle cerebral arteries: Normal right MCA. No flow related enhancement in the left MCA M1 segment. Unchanged degree of collateralization in the distal left MCA distribution. Posterior communicating arteries: Patent on the left. Incomplete on the right. Status post coiling of right P-comm origin aneurysm. Posterior cerebral arteries: Normal. Basilar artery: Normal. Vertebral arteries: Left dominant. Normal. Superior cerebellar arteries: Normal. Anterior inferior cerebellar arteries: Normal. Posterior inferior cerebellar arteries: Normal. IMPRESSION: 1. Critical stenosis of the left internal carotid artery, slightly worsened at the left ICA origin, but otherwise unchanged with minimal flow related enhancement to the level of the ophthalmic segment and no enhancement beyond. No right ICA stenosis. 2.  Unchanged occlusion of the left anterior cerebral artery A 1 segment and left middle cerebral artery M 1 segment. 3. Unchanged moderate narrowing of the mid P 2 segment of the left vertebral artery. This may be exaggerated by changes in flow direction on this time-of-flight study. 4. Status post coiling of right P-comm origin aneurysm without residual aneurysm filling. Electronically Signed   By: Ulyses Jarred M.D.   On: 03/10/2017 14:27   Mr Jeri Cos XW Contrast  Result Date: 02/21/2017 CLINICAL DATA:  Breast cancer. Metastatic lung cancer. History of right posterior communicating artery aneurysm coiling. EXAM: MRI HEAD WITHOUT AND WITH CONTRAST TECHNIQUE: Multiplanar, multiecho pulse sequences of the brain and surrounding structures were obtained without and with intravenous  contrast. CONTRAST:  15 mL MultiHance IV COMPARISON:  MRI head 01/30/2017, 05/25/2011 FINDINGS: Brain: New area of restricted diffusion head of caudate on the left compatible with acute/subacute infarct. Previously noted small areas of restricted diffusion in the right occipital lobe now shows ill-defined enhancement, most consistent with subacute infarct. Small areas of restricted diffusion left parietal cortex and left posterior temporal lobe have resolved. Findings most compatible with ischemia rather than metastatic disease. Chronic microvascular ischemic changes in the white matter and pons. Small chronic infarct left frontal lobe. Negative for hemorrhage. Postcontrast imaging degraded by motion. 6 mm enhancing lesion right occipital lobe has angular margins and most compatible with subacute enhancing infarct based on prior MRI and morphology. No other worrisome enhancing lesions. Vascular: Prior coiling of right posterior communicating artery aneurysm with susceptibility. Chronic occlusion left internal carotid artery Skull and upper cervical spine: C3 vertebral body lesion has progressed in the interval compatible with metastatic  disease. No calvarial lesions. Sinuses/Orbits: Negative Other: None IMPRESSION: When compared with the prior MRI of 01/30/2017, there is improvement in the areas of restricted diffusion in the left parietal and left temporal lobe compatible with infarction. There enhancement of the small lesion in the right occipital lobe most consistent with subacute infarction. There is a new area of subacute infarct in the head of caudate on the left. Chronic occlusion left internal carotid artery with chronic ischemic changes as above. No definite metastatic disease in the brain. Continued close followup is warranted. Progression of bone marrow lesion in the C3 vertebral body compatible with metastatic disease. Electronically Signed   By: Franchot Gallo M.D.   On: 02/21/2017 07:52   US Biopsy (liver)  Result Date: 03/16/2017 INDICATION: Metastatic lung cancer, hepatic mass EXAM: ULTRASOUND GUIDED CORE BIOPSY OF RIGHT HEPATIC MASS MEDICATIONS: 1% LIDOCAINE LOCAL ANESTHESIA/SEDATION: Versed 2.24m IV; Fentanyl 1081m IV; Moderate Sedation Time:  11 MINUTES The patient was continuously monitored during the procedure by the interventional radiology nurse under my direct supervision. FLUOROSCOPY TIME:  Fluoroscopy Time: NONE. COMPLICATIONS: None immediate. PROCEDURE: The procedure, risks, benefits, and alternatives were explained to the patient. Questions regarding the procedure were encouraged and answered. The patient understands and consents to the procedure. Previous imaging reviewed. Patient positioned right anterior oblique. Preliminary ultrasound performed. The 3.6 cm hypoechoic solid lesion in the right hepatic dome was localized. Overlying skin marked. Under sterile conditions and local anesthesia, a 17 gauge 11.8 cm access needle was advanced percutaneously into the lesion. Needle position confirmed with ultrasound. Images obtained for documentation. 3 18 gauge core biopsies obtained. Samples placed in formalin. Needle  tract occluded with Gel-Foam. Postprocedure imaging demonstrates no hemorrhage or hematoma. Patient tolerated the biopsy well. FINDINGS: Imaging confirms needle placed into the right hepatic dome lesion for core biopsy IMPRESSION: Successful ultrasound right hepatic dome mass 18 gauge core biopsy Electronically Signed   By: M.Jerilynn Mages Shick M.D.   On: 03/16/2017 13:03   Ir UsKoreauide Vasc Access Right  Result Date: 03/08/2017 INDICATION: [id number] old with metastatic non-small cell lung cancer. Planning for chemotherapy. EXAM: FLUOROSCOPIC AND ULTRASOUND GUIDED PLACEMENT OF A SUBCUTANEOUS PORT COMPARISON:  None. MEDICATIONS: Ancef 2 g; The antibiotic was administered within an appropriate time interval prior to skin puncture. ANESTHESIA/SEDATION: Versed 2.0 mg IV; Fentanyl 100 mcg IV; Moderate Sedation Time:  36 minutes The patient was continuously monitored during the procedure by the interventional radiology nurse under my direct supervision. FLUOROSCOPY TIME:  18 seconds, 3 mGy COMPLICATIONS: None immediate. PROCEDURE: The procedure, risks, benefits,  and alternatives were explained to the patient. Questions regarding the procedure were encouraged and answered. The patient understands and consents to the procedure. Patient was placed supine on the interventional table. Ultrasound confirmed a patent right internal jugular vein. The right chest and neck were cleaned with a skin antiseptic and a sterile drape was placed. Maximal barrier sterile technique was utilized including caps, mask, sterile gowns, sterile gloves, sterile drape, hand hygiene and skin antiseptic. The right neck was anesthetized with 1% lidocaine. Small incision was made in the right neck with a blade. Micropuncture set was placed in the right internal jugular vein with ultrasound guidance. The micropuncture wire was used for measurement purposes. The right chest was anesthetized with 1% lidocaine with epinephrine. #15 blade was used to make an incision  and a subcutaneous port pocket was formed. [place] was assembled. Subcutaneous tunnel was formed with a stiff tunneling device. The port catheter was brought through the subcutaneous tunnel. The port was placed in the subcutaneous pocket and sutured in place. The micropuncture set was exchanged for a peel-away sheath. The catheter was placed through the peel-away sheath and the tip was positioned at the superior cavoatrial junction. Catheter placement was confirmed with fluoroscopy. The port was accessed and flushed with heparinized saline. The port pocket was closed using two layers of absorbable sutures and Dermabond. The vein skin site was closed using a single layer of absorbable suture and Dermabond. Sterile dressings were applied. Patient tolerated the procedure well without an immediate complication. Ultrasound and fluoroscopic images were taken and saved for this procedure. IMPRESSION: Placement of a subcutaneous port device. Electronically Signed   By: Markus Daft M.D.   On: 03/08/2017 16:54   Ct Biopsy  Result Date: 02/21/2017 INDICATION: Bone lesions EXAM: CT BIOPSY; CT BONE MARROW BIOPSY AND ASPIRATION MEDICATIONS: None. ANESTHESIA/SEDATION: Fentanyl 100 mcg IV; Versed 2 mg IV Moderate Sedation Time:  20 minutes The patient was continuously monitored during the procedure by the interventional radiology nurse under my direct supervision. FLUOROSCOPY TIME:  Fluoroscopy Time:  minutes  seconds ( mGy). COMPLICATIONS: None immediate. PROCEDURE: Informed written consent was obtained from the patient after a thorough discussion of the procedural risks, benefits and alternatives. All questions were addressed. Maximal Sterile Barrier Technique was utilized including caps, mask, sterile gowns, sterile gloves, sterile drape, hand hygiene and skin antiseptic. A timeout was performed prior to the initiation of the procedure. Under CT guidance, a(n) 11 gauge guide needle was advanced into the right iliac  bone. Aspirates and a core were obtained. Subsequently, the 11 gauge needle was directed towards a sclerotic bone lesion in the right iliac bone and a final 11 gauge core was obtained. Post biopsy images demonstrate no hemorrhage. Patient tolerated the procedure well without complication. Vital sign monitoring by nursing staff during the procedure will continue as patient is in the special procedures unit for post procedure observation. The images document guide needle placement within the normal portion of the right iliac bone. Subsequent images demonstrate needle placement in the sclerotic lesion within the right iliac bone. Post biopsy images demonstrate no hemorrhage. FINDINGS: The images document guide needle placement within the normal portion of the right iliac bone. Subsequent images demonstrate needle placement in the sclerotic lesion within the right iliac bone. Post biopsy images demonstrate no hemorrhage. IMPRESSION: Successful right sclerotic bone lesion core biopsy. Successful right iliac bone marrow biopsy. Electronically Signed   By: Marybelle Killings M.D.   On: 02/21/2017 11:20  Ct Biopsy  Result Date: 02/21/2017 INDICATION: Bone lesions EXAM: CT BIOPSY; CT BONE MARROW BIOPSY AND ASPIRATION MEDICATIONS: None. ANESTHESIA/SEDATION: Fentanyl 100 mcg IV; Versed 2 mg IV Moderate Sedation Time:  20 minutes The patient was continuously monitored during the procedure by the interventional radiology nurse under my direct supervision. FLUOROSCOPY TIME:  Fluoroscopy Time:  minutes  seconds ( mGy). COMPLICATIONS: None immediate. PROCEDURE: Informed written consent was obtained from the patient after a thorough discussion of the procedural risks, benefits and alternatives. All questions were addressed. Maximal Sterile Barrier Technique was utilized including caps, mask, sterile gowns, sterile gloves, sterile drape, hand hygiene and skin antiseptic. A timeout was performed prior to the initiation of the procedure.  Under CT guidance, a(n) 11 gauge guide needle was advanced into the right iliac bone. Aspirates and a core were obtained. Subsequently, the 11 gauge needle was directed towards a sclerotic bone lesion in the right iliac bone and a final 11 gauge core was obtained. Post biopsy images demonstrate no hemorrhage. Patient tolerated the procedure well without complication. Vital sign monitoring by nursing staff during the procedure will continue as patient is in the special procedures unit for post procedure observation. The images document guide needle placement within the normal portion of the right iliac bone. Subsequent images demonstrate needle placement in the sclerotic lesion within the right iliac bone. Post biopsy images demonstrate no hemorrhage. FINDINGS: The images document guide needle placement within the normal portion of the right iliac bone. Subsequent images demonstrate needle placement in the sclerotic lesion within the right iliac bone. Post biopsy images demonstrate no hemorrhage. IMPRESSION: Successful right sclerotic bone lesion core biopsy. Successful right iliac bone marrow biopsy. Electronically Signed   By: Marybelle Killings M.D.   On: 02/21/2017 11:20   Dg Chest Port 1 View  Result Date: 03/19/2017 CLINICAL DATA:  Heart palpitations. Metastatic cancer. Previously treated breast cancer. EXAM: PORTABLE CHEST 1 VIEW COMPARISON:  01/18/2017.  PET-CT dated 02/06/2017. FINDINGS: Poor inspiration. Borderline enlarged cardiac silhouette. Increased prominence of the pulmonary vasculature and interstitial markings. No visible pleural fluid. Left axillary surgical clips. Right jugular porta catheter tip in the superior vena cava. Mottled appearance of the right humeral neck. No abnormal activity at that location on the previous PET-CT. IMPRESSION: 1. Poor inspiration with interval borderline cardiomegaly, pulmonary vascular congestion and probable mild interstitial pulmonary edema. 2. Possible interval  right humeral neck metastasis. Electronically Signed   By: Claudie Revering M.D.   On: 03/19/2017 17:19   Ct Bone Marrow Biopsy & Aspiration  Result Date: 02/21/2017 INDICATION: Bone lesions EXAM: CT BIOPSY; CT BONE MARROW BIOPSY AND ASPIRATION MEDICATIONS: None. ANESTHESIA/SEDATION: Fentanyl 100 mcg IV; Versed 2 mg IV Moderate Sedation Time:  20 minutes The patient was continuously monitored during the procedure by the interventional radiology nurse under my direct supervision. FLUOROSCOPY TIME:  Fluoroscopy Time:  minutes  seconds ( mGy). COMPLICATIONS: None immediate. PROCEDURE: Informed written consent was obtained from the patient after a thorough discussion of the procedural risks, benefits and alternatives. All questions were addressed. Maximal Sterile Barrier Technique was utilized including caps, mask, sterile gowns, sterile gloves, sterile drape, hand hygiene and skin antiseptic. A timeout was performed prior to the initiation of the procedure. Under CT guidance, a(n) 11 gauge guide needle was advanced into the right iliac bone. Aspirates and a core were obtained. Subsequently, the 11 gauge needle was directed towards a sclerotic bone lesion in the right iliac bone and a final 11 gauge core was obtained.  Post biopsy images demonstrate no hemorrhage. Patient tolerated the procedure well without complication. Vital sign monitoring by nursing staff during the procedure will continue as patient is in the special procedures unit for post procedure observation. The images document guide needle placement within the normal portion of the right iliac bone. Subsequent images demonstrate needle placement in the sclerotic lesion within the right iliac bone. Post biopsy images demonstrate no hemorrhage. FINDINGS: The images document guide needle placement within the normal portion of the right iliac bone. Subsequent images demonstrate needle placement in the sclerotic lesion within the right iliac bone. Post biopsy  images demonstrate no hemorrhage. IMPRESSION: Successful right sclerotic bone lesion core biopsy. Successful right iliac bone marrow biopsy. Electronically Signed   By: Marybelle Killings M.D.   On: 02/21/2017 11:20   Ir Fluoro Guide Port Insertion Right  Result Date: 03/08/2017 INDICATION: 67 year old with metastatic non-small cell lung cancer. Planning for chemotherapy. EXAM: FLUOROSCOPIC AND ULTRASOUND GUIDED PLACEMENT OF A SUBCUTANEOUS PORT COMPARISON:  None. MEDICATIONS: Ancef 2 g; The antibiotic was administered within an appropriate time interval prior to skin puncture. ANESTHESIA/SEDATION: Versed 2.0 mg IV; Fentanyl 100 mcg IV; Moderate Sedation Time:  36 minutes The patient was continuously monitored during the procedure by the interventional radiology nurse under my direct supervision. FLUOROSCOPY TIME:  18 seconds, 3 mGy COMPLICATIONS: None immediate. PROCEDURE: The procedure, risks, benefits, and alternatives were explained to the patient. Questions regarding the procedure were encouraged and answered. The patient understands and consents to the procedure. Patient was placed supine on the interventional table. Ultrasound confirmed a patent right internal jugular vein. The right chest and neck were cleaned with a skin antiseptic and a sterile drape was placed. Maximal barrier sterile technique was utilized including caps, mask, sterile gowns, sterile gloves, sterile drape, hand hygiene and skin antiseptic. The right neck was anesthetized with 1% lidocaine. Small incision was made in the right neck with a blade. Micropuncture set was placed in the right internal jugular vein with ultrasound guidance. The micropuncture wire was used for measurement purposes. The right chest was anesthetized with 1% lidocaine with epinephrine. #15 blade was used to make an incision and a subcutaneous port pocket was formed. [place] was assembled. Subcutaneous tunnel was formed with a stiff tunneling device. The  port catheter was brought through the subcutaneous tunnel. The port was placed in the subcutaneous pocket and sutured in place. The micropuncture set was exchanged for a peel-away sheath. The catheter was placed through the peel-away sheath and the tip was positioned at the superior cavoatrial junction. Catheter placement was confirmed with fluoroscopy. The port was accessed and flushed with heparinized saline. The port pocket was closed using two layers of absorbable sutures and Dermabond. The vein skin site was closed using a single layer of absorbable suture and Dermabond. Sterile dressings were applied. Patient tolerated the procedure well without an immediate complication. Ultrasound and fluoroscopic images were taken and saved for this procedure. IMPRESSION: Placement of a subcutaneous port device. Electronically Signed   By: Markus Daft M.D.   On: 03/08/2017 16:54   Vas [place] Lower Extremity Venous (dvt)  Result Date: 02/24/2017  Lower Venous Study Indication: Pain. Examination Guidelines: A complete evaluation includes B-mode imaging, spectral doppler, color doppler, and power doppler as needed of all accessible portions of each vessel. Bilateral testing is considered an integral part of a complete examination. Limited examinations for reoccurring indications may be performed as noted. The reflux portion of the exam is performed  with the patient in reverse Trendelenburg.  Right Venous Findings: +---+---------------+---------+-----------+----------+-------+    CompressibilityPhasicitySpontaneityPropertiesSummary +---+---------------+---------+-----------+----------+-------+ CFVFull           Yes      Yes                          +---+---------------+---------+-----------+----------+-------+  Left Venous Findings: +---------+---------------+---------+-----------+----------+-------+          CompressibilityPhasicitySpontaneityPropertiesSummary  +---------+---------------+---------+-----------+----------+-------+ CFV      Full           Yes      Yes                  Acute   +---------+---------------+---------+-----------+----------+-------+ FV Prox  Partial        Yes      Yes                  Acute   +---------+---------------+---------+-----------+----------+-------+ FV Mid   Partial        Yes      Yes                  Acute   +---------+---------------+---------+-----------+----------+-------+ FV DistalPartial        Yes      Yes                  Acute   +---------+---------------+---------+-----------+----------+-------+ PFV      Full                                                 +---------+---------------+---------+-----------+----------+-------+ POP      Partial        Yes      Yes                  Acute   +---------+---------------+---------+-----------+----------+-------+ PTV      Partial                                      Acute   +---------+---------------+---------+-----------+----------+-------+ PERO     Partial                                      Acute   +---------+---------------+---------+-----------+----------+-------+ GSV      Partial                                      Acute   +---------+---------------+---------+-----------+----------+-------+ SSV      Full                                                 +---------+---------------+---------+-----------+----------+-------+    Final Interpretation Right: No evidence of common femoral vein obstruction. Left: There is evidence of acute DVT in the Femoral vein, Popliteal vein, Posterior Tibial vein, Peroneal vein, and Gastrocnemius vein. There is no evidence of superficial venous thrombosis. No cystic structure found in the popliteal fossa.  See table(s) above for measurements and observations.  Electronically signed by Servando Snare on  02/24/2017 at 9:31:02 PM.     Microbiology: Recent Results (from the past 240  hour(s))  MRSA PCR Screening     Status: None   Collection Time: 03/19/17 10:00 PM  Result Value Ref Range Status   MRSA by PCR NEGATIVE NEGATIVE Final    Comment:        The GeneXpert MRSA Assay (FDA approved for NASAL specimens only), is one component of a comprehensive MRSA colonization surveillance program. It is not intended to diagnose MRSA infection nor to guide or monitor treatment for MRSA infections. Performed at [facility], [address] [place], [place] [address]      Labs: Basic Metabolic Panel: Recent Labs  Lab 03/18/17 0500 03/19/17 1627 03/20/17 0246 03/21/17 0434 03/22/17 0446  NA 143 137 137 136 137  K 4.0 3.7 2.9* 3.5 4.0  CL 107 99* 97* 98* 99*  CO2 _0 GLUCOSE 123* 141* 124* 113* 98  BUN _1 CREATININE 0.58 0.78 0.62 0.52 0.57  CALCIUM 9.4 9.4 9.0 9.2 9.8  MG  --  1.7  --   --   --    Liver Function Tests: Recent Labs  Lab 03/16/17 0416  AST 23  ALT 58*  ALKPHOS 271*  BILITOT 0.7  PROT 6.2*  ALBUMIN 3.1*   CBC: Recent Labs  Lab 03/16/17 0431 03/17/17 0440 03/18/17 0500 03/19/17 1627 03/20/17 0246  WBC 14.4* 14.0* 11.8* 16.6* 13.2*  NEUTROABS  --   --   --  14.1*  --   HGB 8.8* 8.1* 8.5* 9.4* 8.6*  HCT 31.3* 28.3* 31.4* 33.5* 30.2*  MCV 77.9* 78.0 79.9 78.1 76.6*  PLT 253 220 250 217 202   Cardiac Enzymes: Recent Labs  Lab 03/19/17 1627 03/19/17 2042 03/20/17 0246 03/20/17 0824  TROPONINI 0.10* 0.10* 0.11* 0.11*   BNP: BNP (last 3 results) Recent Labs    03/19/17 1627  BNP 251.0*   CBG: Recent Labs  Lab 03/21/17 0748 03/21/17 1622 03/21/17 2227 03/22/17 0736 03/22/17 1056  GLUCAP 80 101* 100* 90 86    Signed:  Barton Dubois MD.  [facility] 03/22/2017, 4:33 PM

## 2017-03-22 NOTE — Care Management Important Message (Signed)
Important Message  Patient Details  Name: [name] MRN: [id number] Date of Birth: 1950-05-22   Medicare Important Message Given:  Yes    Hedaya Latendresse, Chauncey Reading, RN 03/22/2017, 2:47 PM

## 2017-03-23 ENCOUNTER — Ambulatory Visit
Admission: RE | Admit: 2017-03-23 | Discharge: 2017-03-23 | Disposition: A | Discharge status: Home / Self Care | Payer: Medicare Other | Source: Ambulatory Visit | Attending: Radiation Oncology | Admitting: Radiation Oncology

## 2017-03-23 ENCOUNTER — Inpatient Hospital Stay
Admission: RE | Admit: 2017-03-23 | Discharge: 2017-03-28 | Disposition: A | Discharge status: Still In Hospital | Payer: Medicare Other | Source: Ambulatory Visit | Attending: Internal Medicine | Admitting: Internal Medicine

## 2017-03-23 DIAGNOSIS — C7989 Secondary malignant neoplasm of other specified sites: Secondary | ICD-10-CM | POA: Diagnosis not present

## 2017-03-23 DIAGNOSIS — R4182 Altered mental status, unspecified: Secondary | ICD-10-CM | POA: Diagnosis not present

## 2017-03-23 DIAGNOSIS — E119 Type 2 diabetes mellitus without complications: Secondary | ICD-10-CM | POA: Diagnosis not present

## 2017-03-23 DIAGNOSIS — I6523 Occlusion and stenosis of bilateral carotid arteries: Secondary | ICD-10-CM | POA: Diagnosis not present

## 2017-03-23 DIAGNOSIS — Z91040 Latex allergy status: Secondary | ICD-10-CM | POA: Diagnosis not present

## 2017-03-23 DIAGNOSIS — C7949 Secondary malignant neoplasm of other parts of nervous system: Secondary | ICD-10-CM | POA: Diagnosis not present

## 2017-03-23 DIAGNOSIS — I4891 Unspecified atrial fibrillation: Secondary | ICD-10-CM | POA: Diagnosis not present

## 2017-03-23 DIAGNOSIS — S72121A Displaced fracture of lesser trochanter of right femur, initial encounter for closed fracture: Secondary | ICD-10-CM | POA: Diagnosis not present

## 2017-03-23 DIAGNOSIS — Z853 Personal history of malignant neoplasm of breast: Secondary | ICD-10-CM | POA: Diagnosis not present

## 2017-03-23 DIAGNOSIS — Z510 Encounter for antineoplastic radiation therapy: Secondary | ICD-10-CM | POA: Diagnosis not present

## 2017-03-23 DIAGNOSIS — C3412 Malignant neoplasm of upper lobe, left bronchus or lung: Secondary | ICD-10-CM | POA: Diagnosis not present

## 2017-03-23 DIAGNOSIS — Z79899 Other long term (current) drug therapy: Secondary | ICD-10-CM | POA: Diagnosis not present

## 2017-03-23 DIAGNOSIS — G893 Neoplasm related pain (acute) (chronic): Secondary | ICD-10-CM | POA: Diagnosis not present

## 2017-03-23 DIAGNOSIS — C3401 Malignant neoplasm of right main bronchus: Secondary | ICD-10-CM | POA: Diagnosis not present

## 2017-03-23 DIAGNOSIS — F329 Major depressive disorder, single episode, unspecified: Secondary | ICD-10-CM | POA: Diagnosis not present

## 2017-03-23 DIAGNOSIS — R279 Unspecified lack of coordination: Secondary | ICD-10-CM | POA: Diagnosis not present

## 2017-03-23 DIAGNOSIS — D649 Anemia, unspecified: Secondary | ICD-10-CM | POA: Diagnosis not present

## 2017-03-23 DIAGNOSIS — C7931 Secondary malignant neoplasm of brain: Secondary | ICD-10-CM | POA: Diagnosis not present

## 2017-03-23 DIAGNOSIS — E7800 Pure hypercholesterolemia, unspecified: Secondary | ICD-10-CM | POA: Diagnosis not present

## 2017-03-23 DIAGNOSIS — M25551 Pain in right hip: Secondary | ICD-10-CM | POA: Diagnosis not present

## 2017-03-23 DIAGNOSIS — Z87891 Personal history of nicotine dependence: Secondary | ICD-10-CM | POA: Diagnosis not present

## 2017-03-23 DIAGNOSIS — Z7901 Long term (current) use of anticoagulants: Secondary | ICD-10-CM | POA: Diagnosis not present

## 2017-03-23 DIAGNOSIS — C799 Secondary malignant neoplasm of unspecified site: Secondary | ICD-10-CM | POA: Diagnosis not present

## 2017-03-23 DIAGNOSIS — R4020 Unspecified coma: Secondary | ICD-10-CM | POA: Diagnosis not present

## 2017-03-23 DIAGNOSIS — F0390 Unspecified dementia without behavioral disturbance: Secondary | ICD-10-CM | POA: Diagnosis not present

## 2017-03-23 DIAGNOSIS — I5030 Unspecified diastolic (congestive) heart failure: Secondary | ICD-10-CM | POA: Diagnosis not present

## 2017-03-23 DIAGNOSIS — Z743 Need for continuous supervision: Secondary | ICD-10-CM | POA: Diagnosis not present

## 2017-03-23 DIAGNOSIS — M6281 Muscle weakness (generalized): Secondary | ICD-10-CM | POA: Diagnosis not present

## 2017-03-23 DIAGNOSIS — M069 Rheumatoid arthritis, unspecified: Secondary | ICD-10-CM | POA: Diagnosis not present

## 2017-03-23 DIAGNOSIS — C3490 Malignant neoplasm of unspecified part of unspecified bronchus or lung: Secondary | ICD-10-CM | POA: Diagnosis not present

## 2017-03-23 DIAGNOSIS — C4020 Malignant neoplasm of long bones of unspecified lower limb: Secondary | ICD-10-CM | POA: Diagnosis not present

## 2017-03-23 DIAGNOSIS — C7951 Secondary malignant neoplasm of bone: Secondary | ICD-10-CM | POA: Diagnosis not present

## 2017-03-23 DIAGNOSIS — I82412 Acute embolism and thrombosis of left femoral vein: Secondary | ICD-10-CM | POA: Diagnosis not present

## 2017-03-23 DIAGNOSIS — E118 Type 2 diabetes mellitus with unspecified complications: Secondary | ICD-10-CM | POA: Diagnosis not present

## 2017-03-23 DIAGNOSIS — Z8541 Personal history of malignant neoplasm of cervix uteri: Secondary | ICD-10-CM | POA: Diagnosis not present

## 2017-03-23 DIAGNOSIS — C220 Liver cell carcinoma: Secondary | ICD-10-CM | POA: Diagnosis not present

## 2017-03-23 DIAGNOSIS — I10 Essential (primary) hypertension: Secondary | ICD-10-CM | POA: Diagnosis not present

## 2017-03-23 DIAGNOSIS — R52 Pain, unspecified: Secondary | ICD-10-CM | POA: Diagnosis not present

## 2017-03-23 LAB — GLUCOSE, CAPILLARY
GLUCOSE-CAPILLARY: 100 mg/dL — AB (ref 65–99)
Glucose-Capillary: 164 mg/dL — ABNORMAL HIGH (ref 65–99)

## 2017-03-23 MED ORDER — METOPROLOL TARTRATE 25 MG PO TABS: 50.0000 mg | ORAL_TABLET | Freq: Two times a day (BID) | ORAL | Status: AC

## 2017-03-23 MED ORDER — METOPROLOL TARTRATE 50 MG PO TABS
50.0000 mg | ORAL_TABLET | Freq: Two times a day (BID) | ORAL | Status: DC
  Administered 2017-03-23: 50 mg via ORAL
  Filled 2017-03-23: qty 1

## 2017-03-23 NOTE — Clinical Social Work Placement (Signed)
   CLINICAL SOCIAL WORK PLACEMENT  NOTE  Date:  03/23/2017  Patient Details  Name: [name] MRN: [id number] Date of Birth: [birth year]  Clinical Social Work is seeking post-discharge placement for this patient at the [place] level of care (*CSW will initial, date and re-position this form in  chart as items are completed):  Yes   Patient/family provided with [facility] Work Department's list of facilities offering this level of care within the geographic area requested by the patient (or if unable, by the patient's family).  Yes   Patient/family informed of their freedom to choose among providers that offer the needed level of care, that participate in Medicare, Medicaid or managed care program needed by the patient, have an available bed and are willing to accept the patient.  Yes   Patient/family informed of [facility]'s ownership interest in [facility] and [facility] as well as of the fact that they are under no obligation to receive care at these facilities.  PASRR submitted to EDS on 03/21/17     PASRR number received on 03/22/17     Existing PASRR number confirmed on       FL2 transmitted to all facilities in geographic area requested by pt/family on 03/21/17     FL2 transmitted to all facilities within larger geographic area on       Patient informed that his/her managed care company has contracts with or will negotiate with certain facilities, including the following:        Yes   Patient/family informed of bed offers received.  Patient chooses bed at [facility]     Physician recommends and patient chooses bed at      Patient to be transferred to [facility] on 03/23/17.  Patient to be transferred to facility by wheelchair     Patient family notified on 03/23/17 of transfer.  Name of family member notified:  [name] (Spouse)      PHYSICIAN       Additional Comment: Pt will go for radiation  and then dc to [facility]. Updated RN. Pt and family in agreement. DC clinical sent through the [place]. No other SW needs for dc.   _______________________________________________ Shade Flood, LCSW 03/23/2017, 10:59 AM

## 2017-03-23 NOTE — Clinical Social Work Note (Signed)
LCSW following. Pt will go to [facility] for radiation today and once she returns, she will transfer to [facility]. Will update Kerri at [facility] and send dc clinical through the hub. Updated pt, her husband, and pt's RN, Lattie Haw.

## 2017-03-23 NOTE — Progress Notes (Signed)
Patient seen and examined. Better overall, even still having pain. Patient denies palpitation, but HR sustaining in 120's range; She is sinus rhythm. Plan is for radiation therapy at [facility] and subsequent discharge to [facility]. Patient is hemodynamically stable. After discussing with cardiology will increase metoprolol to 50mg  BID for better rate control. Discharge summary from 03/22/17 reviewed and updated. Patient ok and stable for discharge.  Barton Dubois MD [phone]

## 2017-03-24 ENCOUNTER — Encounter (HOSPITAL_COMMUNITY)
Admission: RE | Admit: 2017-03-24 | Discharge: 2017-03-24 | Disposition: A | Discharge status: Home / Self Care | Payer: Medicare Other | Source: Skilled Nursing Facility | Attending: Internal Medicine | Admitting: Internal Medicine

## 2017-03-24 ENCOUNTER — Other Ambulatory Visit (HOSPITAL_COMMUNITY): Payer: Medicare Other

## 2017-03-24 ENCOUNTER — Non-Acute Institutional Stay (SKILLED_NURSING_FACILITY): Payer: Medicare Other | Admitting: Internal Medicine

## 2017-03-24 ENCOUNTER — Other Ambulatory Visit: Payer: Self-pay

## 2017-03-24 ENCOUNTER — Ambulatory Visit
Admission: RE | Admit: 2017-03-24 | Discharge: 2017-03-24 | Disposition: A | Discharge status: Home / Self Care | Payer: Medicare Other | Source: Ambulatory Visit | Attending: Radiation Oncology | Admitting: Radiation Oncology

## 2017-03-24 ENCOUNTER — Ambulatory Visit (HOSPITAL_COMMUNITY): Payer: Medicare Other | Admitting: Internal Medicine

## 2017-03-24 ENCOUNTER — Encounter: Payer: Self-pay | Admitting: Internal Medicine

## 2017-03-24 ENCOUNTER — Telehealth: Payer: Self-pay | Admitting: Radiation Oncology

## 2017-03-24 DIAGNOSIS — Z87891 Personal history of nicotine dependence: Secondary | ICD-10-CM | POA: Diagnosis not present

## 2017-03-24 DIAGNOSIS — I4891 Unspecified atrial fibrillation: Secondary | ICD-10-CM

## 2017-03-24 DIAGNOSIS — R52 Pain, unspecified: Secondary | ICD-10-CM | POA: Diagnosis not present

## 2017-03-24 DIAGNOSIS — C7951 Secondary malignant neoplasm of bone: Secondary | ICD-10-CM

## 2017-03-24 DIAGNOSIS — M069 Rheumatoid arthritis, unspecified: Secondary | ICD-10-CM | POA: Insufficient documentation

## 2017-03-24 DIAGNOSIS — I10 Essential (primary) hypertension: Secondary | ICD-10-CM | POA: Diagnosis not present

## 2017-03-24 DIAGNOSIS — C3412 Malignant neoplasm of upper lobe, left bronchus or lung: Secondary | ICD-10-CM | POA: Diagnosis not present

## 2017-03-24 DIAGNOSIS — E118 Type 2 diabetes mellitus with unspecified complications: Secondary | ICD-10-CM

## 2017-03-24 DIAGNOSIS — C7949 Secondary malignant neoplasm of other parts of nervous system: Secondary | ICD-10-CM | POA: Diagnosis not present

## 2017-03-24 DIAGNOSIS — Z510 Encounter for antineoplastic radiation therapy: Secondary | ICD-10-CM | POA: Diagnosis not present

## 2017-03-24 DIAGNOSIS — C7931 Secondary malignant neoplasm of brain: Secondary | ICD-10-CM | POA: Diagnosis not present

## 2017-03-24 DIAGNOSIS — E7800 Pure hypercholesterolemia, unspecified: Secondary | ICD-10-CM | POA: Diagnosis not present

## 2017-03-24 LAB — CBC WITH DIFFERENTIAL/PLATELET
Basophils Absolute: 0 10*3/uL (ref 0.0–0.1)
Basophils Relative: 0 %
EOS PCT: 2 %
Eosinophils Absolute: 0.1 10*3/uL (ref 0.0–0.7)
HEMATOCRIT: 33.2 % — AB (ref 36.0–46.0)
Hemoglobin: 9.2 g/dL — ABNORMAL LOW (ref 12.0–15.0)
LYMPHS ABS: 0.8 10*3/uL (ref 0.7–4.0)
LYMPHS PCT: 12 %
MCH: 21.7 pg — AB (ref 26.0–34.0)
MCHC: 27.7 g/dL — ABNORMAL LOW (ref 30.0–36.0)
MCV: 78.3 fL (ref 78.0–100.0)
MONO ABS: 0.8 10*3/uL (ref 0.1–1.0)
Monocytes Relative: 12 %
Neutro Abs: 5.1 10*3/uL (ref 1.7–7.7)
Neutrophils Relative %: 74 %
PLATELETS: 180 10*3/uL (ref 150–400)
RBC: 4.24 MIL/uL (ref 3.87–5.11)
RDW: 19.4 % — AB (ref 11.5–15.5)
WBC: 6.8 10*3/uL (ref 4.0–10.5)

## 2017-03-24 LAB — BASIC METABOLIC PANEL
Anion gap: 9 (ref 5–15)
BUN: 8 mg/dL (ref 6–20)
CO2: 29 mmol/L (ref 22–32)
Calcium: 9.7 mg/dL (ref 8.9–10.3)
Chloride: 98 mmol/L — ABNORMAL LOW (ref 101–111)
Creatinine, Ser: 0.57 mg/dL (ref 0.44–1.00)
GFR calc Af Amer: >60 mL/min (ref >60)
GLUCOSE: 117 mg/dL — AB (ref 65–99)
Potassium: 3.6 mmol/L (ref 3.5–5.1)
Sodium: 136 mmol/L (ref 135–145)

## 2017-03-24 MED ORDER — LORAZEPAM 0.5 MG PO TABS: 0.5000 mg | ORAL_TABLET | Freq: Four times a day (QID) | ORAL | 0 refills | Status: DC | PRN

## 2017-03-24 MED ORDER — HYDROMORPHONE HCL 2 MG PO TABS: 2.0000 mg | ORAL_TABLET | Freq: Four times a day (QID) | ORAL | 0 refills | Status: DC | PRN

## 2017-03-24 MED ORDER — MORPHINE SULFATE ER 30 MG PO TBCR: 30.0000 mg | EXTENDED_RELEASE_TABLET | ORAL | 0 refills | Status: AC

## 2017-03-24 NOTE — Telephone Encounter (Signed)
RX Fax for [facility]@ [phone]  

## 2017-03-24 NOTE — Progress Notes (Signed)
Location:   [facility] Room Number: 155/P Place of Service:  SNF (31) Provider:  Lucretia Field, MD  Patient Care Team: Jani Gravel, MD as PCP - General (Internal Medicine)  Extended Emergency Contact Information Primary Emergency Contact: Ambrose Pancoast Address: [address] [place]          [place], [place] [address] [place] Phone: [phone] Mobile Phone: [phone] Relation: Spouse  Code Status:  DNR Goals of care: Advanced Directive information Advanced Directives 03/24/2017  Does Patient Have a Medical Advance Directive? Yes  Type of Advance Directive Out of facility DNR (pink MOST or yellow form)  Does patient want to make changes to medical advance directive? No - Patient declined  Copy of [place] in Chart? No - copy requested  Would patient like information on creating a medical advance directive? No - Patient declined  Pre-existing out of facility DNR order (yellow form or pink MOST form) -    Chief complaint acute visit status post hospitalization for tachycardia thought secondary to A. fib with rapid ventricular rate complicated with history of metastatic cancer   HPI:  Pt is a 67 y.o. female seen today for a hospital f/u after hospitalization for tachycardia.  Patient has a somewhat complicated medical history of widely metastatic cancer had been on palliative care for pain management also history of prior DVTs on Xarelto-also history of diabetes type 2 hypertension rheumatoid arthritis and depression.  She had recently been discharged from the hospital after admission for pain control.  She was visited by the hospice physician who noted that she was quite tachycardic at home and was sent to the ER.  She was noted on EKG to have atrial fibrillation with rapid ventricular rate chest x-ray showed mild interstitial edema and cardiomegaly.  Her BNP was 251 troponin was 0.1.  She was given metoprolol  push with some improvement in tachycardia but it elevated again.  It was decided to admit her and she was placed on an amiodarone drip for rate control with cardiology evaluation.  She also did get a dose of IV Lasix  Regards to the A. fib a 2D echo showed normal ejection fraction- she was eventually rate controlled with amiodarone drip-and was discharged on Lopressor twice a day dose was adjusted to 50 mg twice daily before discharge.  She continues on Xarelto chronically with history of recurrent DVTs.  She will have cardiology follow-up.  In regards to her metastatic lung malignancy primary cancer was in the left upper lobe related to the liver and right lung.  She is receiving radiation therapy and received that this morning.  She is on numerous medications including MS Contin 30 mg every 8 hours and oral Dilaudid for breakthrough pain.  Regards to her other medical issues these appear to be relatively stable she is a type II diabetic so far blood sugars have been in the mid 100s- she is currently not on any agent but appears at one point was on Glucophage- this will have to be monitored and possibly reinstitute oral medication if blood sugars are elevated.  Regards to rheumatoid arthritis she is on Plaquenil and steroid treatment.  Regards to depression with anxiety she continues on Cymbalta and has as needed Ativan.  Currently she continues to complain of some pain at times this will have to be monitored   Initially she was somewhat tachycardic when I evaluated her with pulse between 110 and1 20-however when  I reevaluated her pulse was in the high 90s-100 and this will have to be watched blood pressure was 150/80  She does not complain of any increased shortness of breath or chest pain.    .    Past Medical History:  Diagnosis Date  . Anemia   . Angina   . Arthritis    Rheumatoid  . Brain cancer ([place])   . Breast cancer ([place])   . Cancer ([place])    left breast, cervical  cancer  . Colon polyps   . Constipation   . Diabetes mellitus without complication ([place])   . Diarrhea   . Dysrhythmia   . GERD (gastroesophageal reflux disease)   . Headache   . Heart murmur   . History of kidney stones   . Hypercholesterolemia   . Hypertension   . Lung cancer ([place])   . Numbness and tingling in right hand   . Numbness and tingling of both feet   . Peripheral vascular disease ([place])    cerebrel aneurysm /sp coil  . Pneumonia   . PONV (postoperative nausea and vomiting)   . Raynaud's syndrome   . Rheumatoid arthritis with rheumatoid factor (H[facility])   . Right knee DJD   . Stroke [facility])    jan 2012  . VTE (venous thromboembolism)    Past Surgical History:  Procedure Laterality Date  . ABDOMINAL HYSTERECTOMY  1977  . APPENDECTOMY  1964  . BILATERAL OOPHORECTOMY    . CHOLECYSTECTOMY    . COLONOSCOPY  12/09/2010   Procedure: COLONOSCOPY;  Surgeon: Rogene Houston, MD;  [facility];  Service: Endoscopy;  Laterality: N/A;  . COLONOSCOPY N/A 06/26/2015   Procedure: COLONOSCOPY;  Surgeon: Rogene Houston, MD;  [facility];  Service: Endoscopy;  Laterality: N/A;  1:05-moved to [address] to notify pt  . ESOPHAGOGASTRODUODENOSCOPY N/A 06/26/2015   Procedure: ESOPHAGOGASTRODUODENOSCOPY (EGD);  Surgeon: Rogene Houston, MD;  [facility];  Service: Endoscopy;  Laterality: N/A;  . EYE SURGERY     cataract surgery bilat   . GIVENS CAPSULE STUDY N/A 08/20/2015   Procedure: GIVENS CAPSULE STUDY;  Surgeon: Rogene Houston, MD;  [facility];  Service: Endoscopy;  Laterality: N/A;  730  . IR FLUORO GUIDE PORT INSERTION RIGHT  03/08/2017  . IR US GUIDE VASC ACCESS RIGHT  03/08/2017  . left breast lumpectomy  2004  . left knee arthroscopy    . Right brain aneurysm  04/2010   Coiled   . right wrist surgery  1999   synevectomy  . TOTAL KNEE ARTHROPLASTY Right 02/25/2013   Procedure: TOTAL KNEE ARTHROPLASTY;  Surgeon: Lorn Junes, MD;   Location: [facility];  Service: Orthopedics;  Laterality: Right;    Allergies  Allergen Reactions  . Aurothioglucose [Solganal] Other (See Comments)    REACTION: VASCULITIS   . Feldene [Piroxicam] Anaphylaxis and Other (See Comments)    SKIN BLISTERS  . Hydralazine Other (See Comments)    REACTION: Chest pains and headaches: IV only  . Imdur [Isosorbide Mononitrate] Other (See Comments)    REACTION: PATIENT IS UNABLE TO WALK OR FUNCTION  . Imuran [Azathioprine] Other (See Comments)    Could not walk or function  . Methotrexate Derivatives Other (See Comments)    REACTION: VASCULITIS  . Calcium Channel Blockers Palpitations  . Sulfa Antibiotics Diarrhea and Nausea And Vomiting  . Thimerosal Other (See Comments)    SKIN BLISTERED  . Vioxx [Rofecoxib] Other (  See Comments)    REACTION: CAUSED MORE JOINT PAIN  . Arava [Leflunomide]     UNSPECIFIED REACTION   . Asacol [Mesalamine]     UNSPECIFIED REACTION   . Iohexol Other (See Comments)    PT STATES SHE WAS TOLD NEVER TO HAVE CT CONTRAST AGAIN.  SHE HAD SOME KIND OF REACTION BUT DOESNT REMEBER WHAT HAPPENED   . Hydrocodone Itching  . Latex Rash  . Neomycin Rash  . Nickel Rash  . Nsaids Other (See Comments)    GI UPSET , CAN TOLERATE SOME NSAIDS  . Percocet [Oxycodone-Acetaminophen] Other (See Comments)    INSOMNIA    Outpatient Encounter Medications as of 03/24/2017  Medication Sig  . acetaminophen (TYLENOL) 325 MG tablet Take 2 tablets (650 mg total) by mouth every 6 (six) hours as needed for mild pain (or Fever >/= 101).  Marland Kitchen allopurinol (ZYLOPRIM) 300 MG tablet Take 300 mg by mouth daily.   Marland Kitchen atorvastatin (LIPITOR) 20 MG tablet Take 20 mg by mouth at bedtime.  . Calcium Carbonate-Vit D-Min (CALTRATE 600+D PLUS) 600-400 MG-UNIT per tablet Chew 1 tablet by mouth 2 (two) times daily.    . Cholecalciferol (VITAMIN D) 2000 UNITS tablet Take 2,000 Units by mouth daily.   Marland Kitchen docusate sodium (COLACE) 100 MG capsule Take 1 capsule (100 mg  total) by mouth 2 (two) times daily.  . DULoxetine (CYMBALTA) 60 MG capsule Take 60 mg by mouth daily.  Marland Kitchen HYDROmorphone (DILAUDID) 2 MG tablet Take 1-2 tablets (2-4 mg total) by mouth every 6 (six) hours as needed for severe pain (breakthrough pain).  . hydroxychloroquine (PLAQUENIL) 200 MG tablet Take 200 mg by mouth daily.   Marland Kitchen ipratropium (ATROVENT) 0.06 % nasal spray Place 2 sprays into both nostrils 2 (two) times daily as needed for rhinitis.  Marland Kitchen LORazepam (ATIVAN) 0.5 MG tablet Take 1 tablet (0.5 mg total) by mouth every 6 (six) hours as needed for anxiety (Nausea or vomiting).  Marland Kitchen MAGNESIUM OXIDE PO Take 500 mg by mouth daily.   . methylPREDNISolone (MEDROL) 8 MG tablet Take 8 mg by mouth daily.  . metoprolol tartrate (LOPRESSOR) 25 MG tablet Take 2 tablets (50 mg total) by mouth 2 (two) times daily.  Marland Kitchen morphine (MS CONTIN) 30 MG 12 hr tablet Take 1 tablet (30 mg total) by mouth 3 (three) times daily at 8am, 2pm and bedtime. Administered at 8a, 2pm, and 9pm  . omeprazole (PRILOSEC) 40 MG capsule TAKE (1) CAPSULE BY MOUTH ONCE DAILY.  Vladimir Faster Glycol-Propyl Glycol (SYSTANE) 0.4-0.3 % GEL Apply 1 application to eye daily as needed (for dry eyes).   . polyethylene glycol (MIRALAX) packet Take 17 g by mouth daily.  . prochlorperazine (COMPAZINE) 10 MG tablet Take 1 tablet (10 mg total) by mouth every 6 (six) hours as needed (Nausea or vomiting).  Alveda Reasons 20 MG TABS tablet Take 1 tablet by mouth daily.  . [DISCONTINUED] folic acid (FOLVITE) 1 MG tablet Take 1 tablet (1 mg total) by mouth daily. Start 5-7 days before Alimta chemotherapy. Continue until 21 days after Alimta completed.  . [DISCONTINUED] lidocaine-prilocaine (EMLA) cream Apply to affected area once  . [DISCONTINUED] metFORMIN (GLUCOPHAGE-XR) 500 MG 24 hr tablet Take 500 mg by mouth 2 (two) times a week. No specific days  . [DISCONTINUED] ondansetron (ZOFRAN) 8 MG tablet Take 1 tablet (8 mg total) by mouth 2 (two) times daily as  needed for refractory nausea / vomiting. Start on day 3 after chemo.   No  facility-administered encounter medications on file as of 03/24/2017.      Review of Systems In general she is not complaining of fever chills.  Skin does not complain of rashes itching or diaphoresis.  Head ears eyes nose mouth and throat is not complaining of sore throat or visual changes.  Respiratory does not complain of shortness of breath or increased cough.  Heart is not complaining of chest pain did have some intermittent tachycardia as noted above.  GI is not complaining of abdominal discomfort nausea vomiting at this time does say she has a poor appetite.  GU does not complain of dysuria.  Musculoskeletal at times complains of somewhat generalized pain more so of her legs.  Neurologic does not complain of dizziness headache or syncope.  Psych does not complain of overt depression or anxiety but does apparently   have anxiety at times      There is no immunization history on file for this patient. Pertinent  Health Maintenance Due  Topic Date Due  . FOOT EXAM  04/21/2017 (Originally 03/12/1960)  . MAMMOGRAM  04/21/2017 (Originally 03/12/1999)  . OPHTHALMOLOGY EXAM  04/21/2017 (Originally 03/12/1960)  . URINE MICROALBUMIN  04/21/2017 (Originally 03/12/1960)  . DEXA SCAN  04/21/2017 (Originally 03/11/2014)  . PNA vac Low Risk Adult (1 of 2 - PCV13) 04/21/2017 (Originally 03/11/2014)  . INFLUENZA VACCINE  09/28/2017 (Originally 09/14/2016)  . HEMOGLOBIN A1C  08/24/2017  . COLONOSCOPY  06/25/2025   Fall Risk  02/27/2017 02/15/2017  Falls in the past year? No No  Risk for fall due to : Impaired balance/gait Impaired balance/gait;Medication side effect   Functional Status Survey:    Vitals:   03/24/17 1207  BP: (!) 195/82  Pulse: 97  Resp: 20  Temp: 97.6 F (36.4 C)  TempSrc: Oral  Of note manual blood pressure was 150/80 after she returned from radiation therapy pulse  initially was between 110  and 120 but on reevaluation went down to the high 90s  Physical Exam  In general this is a pleasant elderly female in no distress sitting comfortably in her wheelchair.  Her skin is warm and dry.  Eyes visual acuity appears grossly intact sclera and conjunctive are clear.  Oropharynx is clear mucous membranes moist.  Chest is clear to auscultation with somewhat shallow air entry there is no labored breathing.   t Heart is irregular irregular rate and rhythm initially tachycardic but pulse did come down to the 90s on reevaluation.  She has mild lower extremity edema this is greater on the left versus the right pedal pulses are intact bilaterally  Abdomen is soft nontender with positive bowel sounds.  Musculoskeletal does move all extremities x4 limited mobility of her lower extremities and hip it appears secondary to pain-and apparently this has been somewhat chronic.  Neurologic appears grossly intact she is alert cranial nerves appear to be intact.  Psych she is alert and oriented pleasant and appropriate   Labs reviewed: Recent Labs    03/19/17 1627  03/21/17 0434 03/22/17 0446 03/24/17 0615  NA 137   < > 136 137 136  K 3.7   < > 3.5 4.0 3.6  CL 99*   < > 98* 99* 98*  CO2 24   < > 27 24 29   GLUCOSE 141*   < > 113* 98 117*  BUN 18   < > 13 9 8   CREATININE 0.78   < > 0.52 0.57 0.57  CALCIUM 9.4   < > 9.2 9.8  9.7  MG 1.7  --   --   --   --    < > = values in this interval not displayed.   Recent Labs    03/14/17 1044 03/16/17 0416  AST 28 23  ALT 81* 58*  ALKPHOS 259* 271*  BILITOT 0.4 0.7  PROT 6.0* 6.2*  ALBUMIN 3.0* 3.1*   Recent Labs    03/14/17 1044  03/19/17 1627 03/20/17 0246 03/24/17 0615  WBC 18.9*   < > 16.6* 13.2* 6.8  NEUTROABS 14.6  --  14.1*  --  5.1  HGB 8.4*   < > 9.4* 8.6* 9.2*  HCT 31.0*   < > 33.5* 30.2* 33.2*  MCV 79.5   < > 78.1 76.6* 78.3  PLT 282   < > 217 202 180   < > = values in this interval not displayed.   Lab Results   Component Value Date   TSH 0.225 (L) 03/19/2017   Lab Results  Component Value Date   HGBA1C 6.9 (H) 02/24/2017   Lab Results  Component Value Date   CHOL (H) 02/23/2010    240        ATP III CLASSIFICATION:  <200     mg/dL   Desirable  200-239  mg/dL   Borderline High  >=240    mg/dL   High          HDL 58 02/23/2010   LDLCALC (H) 02/23/2010    132        Total Cholesterol/HDL:CHD Risk Coronary Heart Disease Risk Table                     Men   Women  1/2 Average Risk   3.4   3.3  Average Risk       5.0   4.4  2 X Average Risk   9.6   7.1  3 X Average Risk  23.4   11.0        Use the calculated Patient Ratio above and the CHD Risk Table to determine the patient's CHD Risk.        ATP III CLASSIFICATION (LDL):  <100     mg/dL   Optimal  100-129  mg/dL   Near or Above                    Optimal  130-159  mg/dL   Borderline  160-189  mg/dL   High  >190     mg/dL   Very High   TRIG 248 (H) 02/23/2010   CHOLHDL 4.1 02/23/2010    Significant Diagnostic Results in last 30 days:  No results found.  Assessment/Plan  #1 history of A. fib with rapid ventricular rate she is on Lopressor which was apparently just increased before hospital discharge she is also on Xarelto for anticoagulation this will have to be monitored closely-again her pulse rate has come down on reevaluation-but this will have to be watched clinically she appears to be stable.  Not complain of chest pain lightheadedness syncope- continue to monitor vital signs closely.  2.  History of metastatic lung carcinoma she is followed by oncology is receiving radiation treatments in regards to pain she is on morphine 30 mg 3 times a day as well as Dilaudid 2 mg up to 4 mg every 6 hours as needed for pain.  This will have to be watched as well.  3.-History of recurrent left lower extremity DVT she continues  on Xarelto--continues with some residual edema.  4.  History of rheumatoid arthritis she continues on  Platinol and steroid treatment.  5.  History of diabetes type 2 so far blood sugars appear to be in the mid 100s continue to monitor she had been on oral agents previously if sugars are elevated consider reinstituting these.--HBG A1C --6.9    6.  History of depression with anxiety she continues on Cymbalta she does have as needed.  7.  History of gout she is on allopurinol prophylactically.  7.  For nausea she is on Compazine every 6 hours as needed currently she does not complain of nausea.  8.  History of allergic rhinitis she is on  Ipratropium spray---prn  9-History low magnesium--on supplementt--will recheck this weekend   Also check electrolytes  10-constipation-on miralax and colace...monitor  11-GERD-continues on PPI---  #12 hypertension-at this point will monitor with vital signs I suspect at times situational pain may be contributing to the elevated readings--she is on Lopressor which was just increased before hospital discharge   [id number]-of note greater than 40 minutes spent assessing patient-re assessing patient-reviewing her chart labs- coordinating and formulating a plan of care for numerous diagnoses-of note greater than 50% of time spent coordinating plan of care

## 2017-03-24 NOTE — Telephone Encounter (Signed)
03/21/2017 1215 Received call from Dr. Rosezetta Schlatter, who identified as a friend of the patient, that she was at [facility] needing to continue radiation therapy. Informed Shona Simpson, PA-C of these findings and provided her with Dr. Cinda Quest contact information. Patient at [facility] in ICU unstable for transport.   03/21/2017 around 1500 Dr. Sonny Dandy called back and spoke with Miranda, RT. Informed Bryson Ha that Dr. Sonny Dandy called again. Bryson Ha expressed her need to discuss the case with Dr. Tammi Klippel but had to wait until he returned from the operating room.   03/21/2017 around 1600 Dr. Sonny Dandy called again and spoke with Amy, RT. Bryson Ha phoned Dr. Sonny Dandy back.   03/22/2017 0757 Phoned Evelena Peat, RN caring for patient at [facility] in the ICU. She confirmed arrangements have been made for [facility] to transport the patient to radiation treatment and back to [facility] today.  03/23/2017 0750 Phoned Lilia Pro, RN caring for patient at [facility] on the third floor. She reports [facility] will transport the patient to radiation treatment and back to [facility] again today. She goes onto explain that [facility] has no beds available and the patient's husband is unable to care for her at home.

## 2017-03-25 ENCOUNTER — Encounter: Payer: Self-pay | Admitting: Internal Medicine

## 2017-03-26 ENCOUNTER — Encounter (HOSPITAL_COMMUNITY)
Admission: RE | Admit: 2017-03-26 | Discharge: 2017-03-26 | Disposition: A | Discharge status: Home / Self Care | Payer: Medicare Other | Source: Skilled Nursing Facility | Attending: *Deleted | Admitting: *Deleted

## 2017-03-26 LAB — BASIC METABOLIC PANEL
Anion gap: 11 (ref 5–15)
BUN: 8 mg/dL (ref 6–20)
CO2: 28 mmol/L (ref 22–32)
Calcium: 9.8 mg/dL (ref 8.9–10.3)
Chloride: 101 mmol/L (ref 101–111)
Creatinine, Ser: 0.55 mg/dL (ref 0.44–1.00)
GFR calc Af Amer: >60 mL/min (ref >60)
GFR calc non Af Amer: >60 mL/min (ref >60)
GLUCOSE: 120 mg/dL — AB (ref 65–99)
POTASSIUM: 3.7 mmol/L (ref 3.5–5.1)
Sodium: 140 mmol/L (ref 135–145)

## 2017-03-26 LAB — MAGNESIUM: Magnesium: 1.8 mg/dL (ref 1.7–2.4)

## 2017-03-27 ENCOUNTER — Non-Acute Institutional Stay (SKILLED_NURSING_FACILITY): Payer: Medicare Other | Admitting: Internal Medicine

## 2017-03-27 ENCOUNTER — Ambulatory Visit
Admission: RE | Admit: 2017-03-27 | Discharge: 2017-03-27 | Disposition: A | Discharge status: Home / Self Care | Payer: Medicare Other | Source: Ambulatory Visit | Attending: Radiation Oncology | Admitting: Radiation Oncology

## 2017-03-27 ENCOUNTER — Encounter (HOSPITAL_COMMUNITY): Payer: Self-pay

## 2017-03-27 ENCOUNTER — Encounter: Payer: Self-pay | Admitting: Internal Medicine

## 2017-03-27 DIAGNOSIS — C7931 Secondary malignant neoplasm of brain: Secondary | ICD-10-CM | POA: Diagnosis not present

## 2017-03-27 DIAGNOSIS — R52 Pain, unspecified: Secondary | ICD-10-CM

## 2017-03-27 DIAGNOSIS — C3412 Malignant neoplasm of upper lobe, left bronchus or lung: Secondary | ICD-10-CM

## 2017-03-27 DIAGNOSIS — C7949 Secondary malignant neoplasm of other parts of nervous system: Secondary | ICD-10-CM | POA: Diagnosis not present

## 2017-03-27 DIAGNOSIS — I10 Essential (primary) hypertension: Secondary | ICD-10-CM | POA: Diagnosis not present

## 2017-03-27 DIAGNOSIS — E7800 Pure hypercholesterolemia, unspecified: Secondary | ICD-10-CM | POA: Diagnosis not present

## 2017-03-27 DIAGNOSIS — M069 Rheumatoid arthritis, unspecified: Secondary | ICD-10-CM

## 2017-03-27 DIAGNOSIS — C7951 Secondary malignant neoplasm of bone: Secondary | ICD-10-CM | POA: Diagnosis not present

## 2017-03-27 DIAGNOSIS — I4891 Unspecified atrial fibrillation: Secondary | ICD-10-CM

## 2017-03-27 DIAGNOSIS — Z510 Encounter for antineoplastic radiation therapy: Secondary | ICD-10-CM | POA: Diagnosis not present

## 2017-03-27 DIAGNOSIS — Z87891 Personal history of nicotine dependence: Secondary | ICD-10-CM | POA: Diagnosis not present

## 2017-03-27 DIAGNOSIS — I82412 Acute embolism and thrombosis of left femoral vein: Secondary | ICD-10-CM | POA: Diagnosis not present

## 2017-03-27 NOTE — Progress Notes (Signed)
Provider:Anjali, Lyndel Safe   Location:   [facility] Room Number: 155/P Place of Service:  SNF (31)  PCP: Jani Gravel, MD Patient Care Team: Jani Gravel, MD as PCP - General (Internal Medicine)  Extended Emergency Contact Information Primary Emergency Contact: Revonda Humphrey F Address: [address] [place]          [place], [place] [address] [place] Phone: [phone] Mobile Phone: [phone] Relation: Spouse  Code Status: DNR Goals of Care: Advanced Directive information Advanced Directives 03/27/2017  Does Patient Have a Medical Advance Directive? Yes  Type of Advance Directive Out of facility DNR (pink MOST or yellow form)  Does patient want to make changes to medical advance directive? No - Patient declined  Copy of [place] in Chart? No - copy requested  Would patient like information on creating a medical advance directive? No - Patient declined  Pre-existing out of facility DNR order (yellow form or pink MOST form) -      Chief Complaint  Patient presents with  . New Admit To SNF    New Admission Visit    HPI: Patient is a 67 y.o. female seen today for admission to SNF for Therapy and Pain Control. Patient has h/o Metastatic Adeno Carcinoma  with primary in Lungs. She is under Palliative care with Dr Sonny Dandy for Pain Control.She also has h/o DVT on Xarelto, Hypertension and Rheumatoid Arthritis. Patient with history of metastatic lung cancer diagnosed in 12/18 after she presented with weight loss and pain. She is getting further Work up with Liver Biopsy and BM biopsy . Patient was getting Radiation therapy as Outpatient for Pain Control. She is also getting Palliative care with Pain Meds. She was found to have Tachycardia by Britt Bolognese and send to ED. Where she was found to have New onset Atrial Fibrilation.She was started on IV Amiodarone. She was on Xarelto already for her Recurrent DVT.Her Amiodarone was discontinued. She is  discharged on Lopressor. Her Echo showed EF of 65%  Patient has been very confused in the Facility. Per her Husband her mental status has been deteriorating. She is also Physically getting weak for past few weeks. And has been dependent on her Husband for ADLS.Her appetite is Poor and she has lost few more Pounds recently.  Past Medical History:  Diagnosis Date  . Anemia   . Angina   . Arthritis    Rheumatoid  . Brain cancer ([place])   . Breast cancer ([place])   . Cancer ([facility] [facility])    left breast, cervical cancer  . Colon polyps   . Constipation   . Diabetes mellitus without complication ([facility])   . Diarrhea   . Dysrhythmia   . GERD (gastroesophageal reflux disease)   . Headache   . Heart murmur   . History of kidney stones   . Hypercholesterolemia   . Hypertension   . Lung cancer ([facility])   . Numbness and tingling in right hand   . Numbness and tingling of both feet   . Peripheral vascular disease ([facility])    cerebrel aneurysm /sp coil  . Pneumonia   . PONV (postoperative nausea and vomiting)   . Raynaud's syndrome   . Rheumatoid arthritis with rheumatoid factor (H[facility])   . Right knee DJD   . Stroke [facility])    jan 2012  . VTE (venous thromboembolism)    Past Surgical History:  Procedure Laterality Date  . ABDOMINAL HYSTERECTOMY  1977  . APPENDECTOMY  [place]    . CHOLECYSTECTOMY    . COLONOSCOPY  12/09/2010   Procedure: COLONOSCOPY;  Surgeon: Rogene Houston, MD;  [facility];  Service: Endoscopy;  Laterality: N/A;  . COLONOSCOPY N/A 06/26/2015   Procedure: COLONOSCOPY;  Surgeon: Rogene Houston, MD;  [facility];  Service: Endoscopy;  Laterality: N/A;  1:05-moved to [address] to notify pt  . ESOPHAGOGASTRODUODENOSCOPY N/A 06/26/2015   Procedure: ESOPHAGOGASTRODUODENOSCOPY (EGD);  Surgeon: Rogene Houston, MD;  [facility];  Service: Endoscopy;  Laterality: N/A;  . EYE SURGERY     cataract surgery bilat   . GIVENS CAPSULE STUDY N/A  08/20/2015   Procedure: GIVENS CAPSULE STUDY;  Surgeon: Rogene Houston, MD;  [facility];  Service: Endoscopy;  Laterality: N/A;  730  . IR FLUORO GUIDE PORT INSERTION RIGHT  03/08/2017  . IR US GUIDE VASC ACCESS RIGHT  03/08/2017  . left breast lumpectomy  2004  . left knee arthroscopy    . Right brain aneurysm  04/2010   Coiled   . right wrist surgery  1999   synevectomy  . TOTAL KNEE ARTHROPLASTY Right 02/25/2013   Procedure: TOTAL KNEE ARTHROPLASTY;  Surgeon: Lorn Junes, MD;  Location: [facility];  Service: Orthopedics;  Laterality: Right;    reports that she quit smoking about 33 years ago. She has a 30.00 pack-year smoking history. she has never used smokeless tobacco. She reports that she does not drink alcohol or use drugs. Social History   Socioeconomic History  . Marital status: Married    Spouse name: Hedy Camara  . Number of children: o  . Years of education: 45  . Highest education level: Not on file  Social Needs  . Financial resource strain: Not on file  . Food insecurity - worry: Not on file  . Food insecurity - inability: Not on file  . Transportation needs - medical: Not on file  . Transportation needs - non-medical: Not on file  Occupational History  . Occupation: retired    Fish farm manager: Development worker, [employer]: retired january 2017  Tobacco Use  . Smoking status: Former Smoker    Packs/day: 2.00    Years: 15.00    Pack years: 30.00    Last attempt to quit: 02/20/1984    Years since quitting: 33.1  . Smokeless tobacco: Never Used  . Tobacco comment: Quit 1986  Substance and Sexual Activity  . Alcohol use: No  . Drug use: No  . Sexual activity: Not Currently  Other Topics Concern  . Not on file  Social History Narrative   Employed with [employer]. Retired January 2017. Married with no children. Quit smoking 1986. No alcohol or illicit drug use.    Right handed    Some college.   Caffeine one cup of tea daily.    Functional  Status Survey:    Family History  Problem Relation Age of Onset  . Hypertension Father   . Heart attack Father   . Heart disease Maternal Uncle   . Cancer Maternal Aunt        breast  . Cancer Other        kidney  . Cancer Cousin   . Colon cancer Neg Hx     Health Maintenance  Topic Date Due  . FOOT EXAM  04/21/2017 (Originally 03/12/1960)  . MAMMOGRAM  04/21/2017 (Originally 03/12/1999)  . OPHTHALMOLOGY EXAM  04/21/2017 (Originally 03/12/1960)  . URINE MICROALBUMIN  04/21/2017 (Originally 03/12/1960)  . DEXA SCAN  04/21/2017 (Originally 03/11/2014)  . TETANUS/TDAP  04/21/2017 (Originally [birth year]/11/1968)  . Hepatitis C Screening  04/21/2017 (Originally 1950-11-13)  . PNA vac Low Risk Adult (1 of 2 - PCV13) 04/21/2017 (Originally 03/11/2014)  . INFLUENZA VACCINE  09/28/2017 (Originally 09/14/2016)  . HEMOGLOBIN A1C  08/24/2017  . COLONOSCOPY  06/25/2025    Allergies  Allergen Reactions  . Aurothioglucose [Solganal] Other (See Comments)    REACTION: VASCULITIS   . Feldene [Piroxicam] Anaphylaxis and Other (See Comments)    SKIN BLISTERS  . Hydralazine Other (See Comments)    REACTION: Chest pains and headaches: IV only  . Imdur [Isosorbide Mononitrate] Other (See Comments)    REACTION: PATIENT IS UNABLE TO WALK OR FUNCTION  . Imuran [Azathioprine] Other (See Comments)    Could not walk or function  . Methotrexate Derivatives Other (See Comments)    REACTION: VASCULITIS  . Calcium Channel Blockers Palpitations  . Sulfa Antibiotics Diarrhea and Nausea And Vomiting  . Thimerosal Other (See Comments)    SKIN BLISTERED  . Vioxx [Rofecoxib] Other (See Comments)    REACTION: CAUSED MORE JOINT PAIN  . Arava [Leflunomide]     UNSPECIFIED REACTION   . Asacol [Mesalamine]     UNSPECIFIED REACTION   . Iohexol Other (See Comments)    PT STATES SHE WAS TOLD NEVER TO HAVE CT CONTRAST AGAIN.  SHE HAD SOME KIND OF REACTION BUT DOESNT REMEBER WHAT HAPPENED   . Hydrocodone Itching  . Latex  Rash  . Neomycin Rash  . Nickel Rash  . Nsaids Other (See Comments)    GI UPSET , CAN TOLERATE SOME NSAIDS  . Percocet [Oxycodone-Acetaminophen] Other (See Comments)    INSOMNIA    Outpatient Encounter Medications as of 03/27/2017  Medication Sig  . acetaminophen (TYLENOL) 325 MG tablet Take 2 tablets (650 mg total) by mouth every 6 (six) hours as needed for mild pain (or Fever >/= 101).  Marland Kitchen allopurinol (ZYLOPRIM) 300 MG tablet Take 300 mg by mouth daily.   Marland Kitchen atorvastatin (LIPITOR) 20 MG tablet Take 20 mg by mouth at bedtime.  Roseanne Kaufman Peru-Castor Oil (VENELEX) OINT Apply to sacrum and bilateral buttocks q shift and prn  . Calcium Carbonate-Vit D-Min (CALTRATE 600+D PLUS) 600-400 MG-UNIT per tablet Chew 1 tablet by mouth 2 (two) times daily.    . Cholecalciferol (VITAMIN D) 2000 UNITS tablet Take 2,000 Units by mouth daily.   Marland Kitchen docusate sodium (COLACE) 100 MG capsule Take 1 capsule (100 mg total) by mouth 2 (two) times daily.  . DULoxetine (CYMBALTA) 60 MG capsule Take 60 mg by mouth daily.  Marland Kitchen HYDROmorphone (DILAUDID) 2 MG tablet Take 1-2 tablets (2-4 mg total) by mouth every 6 (six) hours as needed for severe pain (breakthrough pain).  . hydroxychloroquine (PLAQUENIL) 200 MG tablet Take 200 mg by mouth daily.   Marland Kitchen ipratropium (ATROVENT) 0.06 % nasal spray Place 2 sprays into both nostrils 2 (two) times daily as needed for rhinitis.  Marland Kitchen LORazepam (ATIVAN) 0.5 MG tablet Take 1 tablet (0.5 mg total) by mouth every 6 (six) hours as needed for anxiety (Nausea or vomiting).  Marland Kitchen MAGNESIUM OXIDE PO Take 500 mg by mouth daily.   . methylPREDNISolone (MEDROL) 8 MG tablet Take 8 mg by mouth daily.  . metoprolol tartrate (LOPRESSOR) 25 MG tablet Take 2 tablets (50 mg total) by mouth 2 (two) times daily.  Marland Kitchen morphine (MS CONTIN) 30  MG 12 hr tablet Take 1 tablet (30 mg total) by mouth 3 (three) times daily at 8am, 2pm and bedtime. Administered at 8a, 2pm, and 9pm  . omeprazole (PRILOSEC) 40 MG capsule TAKE  (1) CAPSULE BY MOUTH ONCE DAILY.  Vladimir Faster Glycol-Propyl Glycol (SYSTANE) 0.4-0.3 % GEL Apply 1 application to eye daily as needed (for dry eyes).   . polyethylene glycol (MIRALAX) packet Take 17 g by mouth daily.  . prochlorperazine (COMPAZINE) 10 MG tablet Take 1 tablet (10 mg total) by mouth every 6 (six) hours as needed (Nausea or vomiting).  Alveda Reasons 20 MG TABS tablet Take 1 tablet by mouth daily.   No facility-administered encounter medications on file as of 03/27/2017.      Review of Systems  Unable to perform ROS: Other    There were no vitals filed for this visit. There is no height or weight on file to calculate BMI. Physical Exam  Constitutional: She appears well-developed and well-nourished.  HENT:  Head: Normocephalic.  White Patches in The mouth  Eyes: Pupils are equal, round, and reactive to light.  Neck: Neck supple.  Cardiovascular: Normal rate, regular rhythm and normal heart sounds.  No murmur heard. Pulmonary/Chest: Effort normal and breath sounds normal. No respiratory distress. She has no wheezes. She has no rales.  Abdominal: Soft. Bowel sounds are normal. She exhibits no distension. There is no tenderness. There is no rebound.  Musculoskeletal: She exhibits no edema.  Lymphadenopathy:    She has no cervical adenopathy.  Neurological: She is alert.  Patient would not follow simple Commands and I was unable to Do Neurological Exam on Her  Skin: Skin is warm and dry.  Psychiatric: She has a normal mood and affect. Thought content normal.    Labs reviewed: Basic Metabolic Panel: Recent Labs    03/19/17 1627  03/22/17 0446 03/24/17 0615 03/26/17 0825  NA 137   < > 137 136 140  K 3.7   < > 4.0 3.6 3.7  CL 99*   < > 99* 98* 101  CO2 24   < > 24 29 28   GLUCOSE 141*   < > 98 117* 120*  BUN 18   < > 9 8 8   CREATININE 0.78   < > 0.57 0.57 0.55  CALCIUM 9.4   < > 9.8 9.7 9.8  MG 1.7  --   --   --  1.8   < > = values in this interval not displayed.     Liver Function Tests: Recent Labs    03/14/17 1044 03/16/17 0416  AST 28 23  ALT 81* 58*  ALKPHOS 259* 271*  BILITOT 0.4 0.7  PROT 6.0* 6.2*  ALBUMIN 3.0* 3.1*   No results for input(s): LIPASE, AMYLASE in the last 8760 hours. No results for input(s): AMMONIA in the last 8760 hours. CBC: Recent Labs    03/14/17 1044  03/19/17 1627 03/20/17 0246 03/24/17 0615  WBC 18.9*   < > 16.6* 13.2* 6.8  NEUTROABS 14.6  --  14.1*  --  5.1  HGB 8.4*   < > 9.4* 8.6* 9.2*  HCT 31.0*   < > 33.5* 30.2* 33.2*  MCV 79.5   < > 78.1 76.6* 78.3  PLT 282   < > 217 202 180   < > = values in this interval not displayed.   Cardiac Enzymes: Recent Labs    03/19/17 2042 03/20/17 0246 03/20/17 0824  TROPONINI 0.10* 0.11* 0.11*   BNP:  Invalid input(s): POCBNP Lab Results  Component Value Date   HGBA1C 6.9 (H) 02/24/2017   Lab Results  Component Value Date   TSH 0.225 (L) 03/19/2017   No results found for: VITAMINB12 No results found for: FOLATE No results found for: IRON, TIBC, FERRITIN  Imaging and Procedures obtained prior to SNF admission: No results found.  Assessment/Plan  Mental Status Changes D/W Dr Sonny Dandy and husband. Per him these are most likely related to her Pain meds. Plan is to now Discontinue Dilaudid. Decrease her MS contin and Use Roxanol For Breakthrough Pain. She has had recent MRI of head done  Which was negative for any Metastasis.  Metastatic Lung Cancer Patient on Palliative care for Pain Control by Dr Sonny Dandy. She is also getting Palliative Radiation therapy. Oncology os awaiting for Liver Biopsy results to further decide about Chemo for her. Patients husband at this time not sure if she would be able to tolerate Chemo. To be D/W Dr Sonny Dandy about Hospice option. New Onset Atrial Fibrillation Patient on Xarelto and Beta Blocker.  Her rate is controlled. Electrolytes to be repleted as needed. Follow up with Cardiology History of recurrent left LE  DVT Continue on Xarelto Rheumatoid arthritis Patient on Plaquenil and steroids Depression with anxiety  Continue on Cymbalta and lorazepam   Family/ staff Communication:   Labs/tests ordered:  Total time spent in this patient care encounter was 45_ minutes; greater than 50% of the visit spent counseling patient family, reviewing records , Labs and coordinating care for problems addressed at this encounter.

## 2017-03-28 ENCOUNTER — Emergency Department (HOSPITAL_COMMUNITY)
Admission: EM | Admit: 2017-03-28 | Discharge: 2017-03-28 | Disposition: A | Discharge status: Hospice | Payer: Medicare Other | Attending: Emergency Medicine | Admitting: Emergency Medicine

## 2017-03-28 ENCOUNTER — Emergency Department (HOSPITAL_COMMUNITY): Payer: Medicare Other

## 2017-03-28 ENCOUNTER — Other Ambulatory Visit: Payer: Self-pay

## 2017-03-28 ENCOUNTER — Ambulatory Visit: Admission: RE | Admit: 2017-03-28 | Payer: Medicare Other | Source: Ambulatory Visit

## 2017-03-28 ENCOUNTER — Non-Acute Institutional Stay (SKILLED_NURSING_FACILITY): Payer: Medicare Other | Admitting: Internal Medicine

## 2017-03-28 ENCOUNTER — Encounter: Payer: Self-pay | Admitting: Internal Medicine

## 2017-03-28 ENCOUNTER — Encounter (HOSPITAL_COMMUNITY): Payer: Self-pay | Admitting: Emergency Medicine

## 2017-03-28 ENCOUNTER — Encounter: Payer: Self-pay | Admitting: Radiation Oncology

## 2017-03-28 DIAGNOSIS — R4182 Altered mental status, unspecified: Secondary | ICD-10-CM | POA: Diagnosis not present

## 2017-03-28 DIAGNOSIS — C3412 Malignant neoplasm of upper lobe, left bronchus or lung: Secondary | ICD-10-CM

## 2017-03-28 DIAGNOSIS — C7951 Secondary malignant neoplasm of bone: Secondary | ICD-10-CM | POA: Diagnosis not present

## 2017-03-28 DIAGNOSIS — M069 Rheumatoid arthritis, unspecified: Secondary | ICD-10-CM | POA: Insufficient documentation

## 2017-03-28 DIAGNOSIS — I10 Essential (primary) hypertension: Secondary | ICD-10-CM | POA: Diagnosis not present

## 2017-03-28 DIAGNOSIS — R4020 Unspecified coma: Secondary | ICD-10-CM | POA: Diagnosis not present

## 2017-03-28 DIAGNOSIS — I82412 Acute embolism and thrombosis of left femoral vein: Secondary | ICD-10-CM | POA: Diagnosis not present

## 2017-03-28 DIAGNOSIS — Z91040 Latex allergy status: Secondary | ICD-10-CM | POA: Insufficient documentation

## 2017-03-28 DIAGNOSIS — Z79899 Other long term (current) drug therapy: Secondary | ICD-10-CM | POA: Insufficient documentation

## 2017-03-28 DIAGNOSIS — C4020 Malignant neoplasm of long bones of unspecified lower limb: Secondary | ICD-10-CM | POA: Diagnosis not present

## 2017-03-28 DIAGNOSIS — S72121A Displaced fracture of lesser trochanter of right femur, initial encounter for closed fracture: Secondary | ICD-10-CM | POA: Diagnosis not present

## 2017-03-28 DIAGNOSIS — Z853 Personal history of malignant neoplasm of breast: Secondary | ICD-10-CM | POA: Insufficient documentation

## 2017-03-28 DIAGNOSIS — E119 Type 2 diabetes mellitus without complications: Secondary | ICD-10-CM | POA: Diagnosis not present

## 2017-03-28 DIAGNOSIS — Z8541 Personal history of malignant neoplasm of cervix uteri: Secondary | ICD-10-CM | POA: Insufficient documentation

## 2017-03-28 DIAGNOSIS — M25551 Pain in right hip: Secondary | ICD-10-CM | POA: Diagnosis not present

## 2017-03-28 DIAGNOSIS — C3490 Malignant neoplasm of unspecified part of unspecified bronchus or lung: Secondary | ICD-10-CM | POA: Insufficient documentation

## 2017-03-28 DIAGNOSIS — Z87891 Personal history of nicotine dependence: Secondary | ICD-10-CM | POA: Insufficient documentation

## 2017-03-28 LAB — I-STAT CG4 LACTIC ACID, ED: Lactic Acid, Venous: 0.73 mmol/L (ref 0.5–1.9)

## 2017-03-28 LAB — COMPREHENSIVE METABOLIC PANEL
ALBUMIN: 2.4 g/dL — AB (ref 3.5–5.0)
ALK PHOS: 159 U/L — AB (ref 38–126)
ALT: 20 U/L (ref 14–54)
AST: 22 U/L (ref 15–41)
Anion gap: 12 (ref 5–15)
BILIRUBIN TOTAL: 0.8 mg/dL (ref 0.3–1.2)
BUN: 17 mg/dL (ref 6–20)
CALCIUM: 9.8 mg/dL (ref 8.9–10.3)
CO2: 25 mmol/L (ref 22–32)
CREATININE: 0.69 mg/dL (ref 0.44–1.00)
Chloride: 99 mmol/L — ABNORMAL LOW (ref 101–111)
GFR calc Af Amer: >60 mL/min (ref >60)
GFR calc non Af Amer: >60 mL/min (ref >60)
GLUCOSE: 124 mg/dL — AB (ref 65–99)
Potassium: 4.2 mmol/L (ref 3.5–5.1)
Sodium: 136 mmol/L (ref 135–145)
TOTAL PROTEIN: 5.7 g/dL — AB (ref 6.5–8.1)

## 2017-03-28 LAB — CBC WITH DIFFERENTIAL/PLATELET
BASOS ABS: 0 10*3/uL (ref 0.0–0.1)
BASOS PCT: 0 %
Eosinophils Absolute: 0.1 10*3/uL (ref 0.0–0.7)
Eosinophils Relative: 1 %
HEMATOCRIT: 24.7 % — AB (ref 36.0–46.0)
HEMOGLOBIN: 7.2 g/dL — AB (ref 12.0–15.0)
LYMPHS PCT: 5 %
Lymphs Abs: 0.4 10*3/uL — ABNORMAL LOW (ref 0.7–4.0)
MCH: 22.7 pg — ABNORMAL LOW (ref 26.0–34.0)
MCHC: 29.1 g/dL — ABNORMAL LOW (ref 30.0–36.0)
MCV: 77.9 fL — AB (ref 78.0–100.0)
MONOS PCT: 13 %
Monocytes Absolute: 0.9 10*3/uL (ref 0.1–1.0)
NEUTROS ABS: 5.4 10*3/uL (ref 1.7–7.7)
NEUTROS PCT: 81 %
Platelets: 169 10*3/uL (ref 150–400)
RBC: 3.17 MIL/uL — ABNORMAL LOW (ref 3.87–5.11)
RDW: 19.7 % — ABNORMAL HIGH (ref 11.5–15.5)
WBC: 6.8 10*3/uL (ref 4.0–10.5)

## 2017-03-28 LAB — URINALYSIS, ROUTINE W REFLEX MICROSCOPIC
Bilirubin Urine: NEGATIVE
GLUCOSE, UA: NEGATIVE mg/dL
Hgb urine dipstick: NEGATIVE
KETONES UR: 5 mg/dL — AB
Leukocytes, UA: NEGATIVE
Nitrite: NEGATIVE
PROTEIN: NEGATIVE mg/dL
Specific Gravity, Urine: 1.026 (ref 1.005–1.030)
pH: 5 (ref 5.0–8.0)

## 2017-03-28 LAB — PROTIME-INR
INR: 1.42
Prothrombin Time: 17.2 seconds — ABNORMAL HIGH (ref 11.4–15.2)

## 2017-03-28 LAB — I-STAT CHEM 8, ED
BUN: 14 mg/dL (ref 6–20)
CREATININE: 0.7 mg/dL (ref 0.44–1.00)
Calcium, Ion: 1.32 mmol/L (ref 1.15–1.40)
Chloride: 96 mmol/L — ABNORMAL LOW (ref 101–111)
GLUCOSE: 124 mg/dL — AB (ref 65–99)
HCT: 21 % — ABNORMAL LOW (ref 36.0–46.0)
HEMOGLOBIN: 7.1 g/dL — AB (ref 12.0–15.0)
Potassium: 4.2 mmol/L (ref 3.5–5.1)
Sodium: 134 mmol/L — ABNORMAL LOW (ref 135–145)
TCO2: 28 mmol/L (ref 22–32)

## 2017-03-28 MED ORDER — FENTANYL CITRATE (PF) 100 MCG/2ML IJ SOLN
50.0000 ug | INTRAMUSCULAR | Status: DC | PRN
  Administered 2017-03-28: 50 ug via INTRAVENOUS
  Filled 2017-03-28: qty 2

## 2017-03-28 MED ORDER — ONDANSETRON HCL 4 MG/2ML IJ SOLN: 4.0000 mg | Freq: Once | INTRAMUSCULAR | Status: DC

## 2017-03-28 NOTE — ED Notes (Signed)
Pt aware that a urine sample is needed.  A Pur wik has been placed.  Awaiting sample.  RN notified.

## 2017-03-28 NOTE — ED Notes (Signed)
SBAR given to Belenda Cruise, Therapist, sports at [facility]. Callback #[phone]

## 2017-03-28 NOTE — ED Notes (Signed)
Bed: WA07 Expected date:  Expected time:  Means of arrival:  Comments: Hold for cancer center patient

## 2017-03-28 NOTE — Progress Notes (Addendum)
Wheeled patient to exam 7 on the emergency room and assisted the nurse with transfer from stretcher to bed. Provided report to Curt Bears, RN who verbalized she was covering for another nurse. Patient in no distress as I exited. Will check patient status tomorrow to confirms if she is safe to move forward with next radiation treatment. Patient did not receive radiation today. Phoned Iona Beard at [facility] [phone]) to inform him the patient was taken to [facility] emergency room 7 for further work up of uncontrolled pain and altered mental status. Iona Beard reports the patient's Dilaudid was discontinued but she was given 0.5 mg of morphine just prior to transport to [facility].

## 2017-03-28 NOTE — ED Notes (Signed)
All paperwork sent with patient. Fentanyl given prior to discharge. See MAR. No other c/c. No family present to make aware of transfer.

## 2017-03-28 NOTE — Progress Notes (Signed)
Called to L3 by therapist to assess patient. Patient alert but confused. Patient garbling her words. Patient screaming out in pain from her right hip. Dr. Lisbeth Renshaw and Freeman Caldron, PA-C informed of these findings. Instructed to transport patient to ED for head scan and hip xray.

## 2017-03-28 NOTE — Progress Notes (Signed)
Location:   [facility] Room Number: 155/P Place of Service:  SNF (31) Provider:  Yetta Numbers, MD  Patient Care Team: Jani Gravel, MD as PCP - General (Internal Medicine)  Extended Emergency Contact Information Primary Emergency Contact: Ambrose Pancoast Address: [address]          [place],  [address] [place] Phone: [phone] Mobile Phone: [phone] Relation: Spouse  Code Status:  DNR Goals of care: Advanced Directive information Advanced Directives 03/28/2017  Does Patient Have a Medical Advance Directive? Yes  Type of Advance Directive Out of facility DNR (pink MOST or yellow form)  Does patient want to make changes to medical advance directive? No - Patient declined  Copy of [place] in Chart? No - copy requested  Would patient like information on creating a medical advance directive? No - Patient declined  Pre-existing out of facility DNR order (yellow form or pink MOST form) -     Chief Complaint  Patient presents with  . Acute Visit    Patients c/o Pain Control    HPI:  Pt is a 67 y.o. female seen today for an acute visit for Follow up On Pain Control and worsening Mental status. Patient has h/o Metastatic Adeno Carcinoma  with primary in Lungs. She is suppose to go for Radiation therapy today and she is refusing so is her Husband due to pain. Patient is getting Palliative Radiation Therapy for her pain control. But patient is progressively getting weak, confused and Sedated. Plan was to decrease her Pain Meds but she continues ot have severe Pain as soon as she gets out of her bed especially in her LE. Her husband is worried that she would not be able to make trip to [facility] for therapy. Patient does not have any Fever or chills.   Past Medical History:  Diagnosis Date  . Anemia   . Angina   . Arthritis    Rheumatoid  . Brain cancer ([place])   . Breast cancer ([place])   . Cancer  ([place])    left breast, cervical cancer  . Colon polyps   . Constipation   . Diabetes mellitus without complication ([place])   . Diarrhea   . Dysrhythmia   . GERD (gastroesophageal reflux disease)   . Headache   . Heart murmur   . History of kidney stones   . Hypercholesterolemia   . Hypertension   . Lung cancer ([place])   . Numbness and tingling in right hand   . Numbness and tingling of both feet   . Peripheral vascular disease ([place])    cerebrel aneurysm /sp coil  . Pneumonia   . PONV (postoperative nausea and vomiting)   . Raynaud's syndrome   . Rheumatoid arthritis with rheumatoid factor (HCC)   . Right knee DJD   . Stroke [facility])    jan 2012  . VTE (venous thromboembolism)    Past Surgical History:  Procedure Laterality Date  . ABDOMINAL HYSTERECTOMY  1977  . APPENDECTOMY  1964  . BILATERAL OOPHORECTOMY    . CHOLECYSTECTOMY    . COLONOSCOPY  12/09/2010   Procedure: COLONOSCOPY;  Surgeon: Rogene Houston, MD;  Location[facility];  Service: Endoscopy;  Laterality: N/A;  . COLONOSCOPY N/A 06/26/2015   Procedure: COLONOSCOPY;  Surgeon: Rogene Houston, MD;  Location[facility];  Service: Endoscopy;  Laterality: N/A;  1:05-moved to [address] to notify pt  .  ESOPHAGOGASTRODUODENOSCOPY N/A 06/26/2015   Procedure: ESOPHAGOGASTRODUODENOSCOPY (EGD);  Surgeon: Rogene Houston, MD;  [facility];  Service: Endoscopy;  Laterality: N/A;  . EYE SURGERY     cataract surgery bilat   . GIVENS CAPSULE STUDY N/A 08/20/2015   Procedure: GIVENS CAPSULE STUDY;  Surgeon: Rogene Houston, MD;  [facility];  Service: Endoscopy;  Laterality: N/A;  730  . IR FLUORO GUIDE PORT INSERTION RIGHT  03/08/2017  . IR US GUIDE VASC ACCESS RIGHT  03/08/2017  . left breast lumpectomy  2004  . left knee arthroscopy    . Right brain aneurysm  04/2010   Coiled   . right wrist surgery  1999   synevectomy  . TOTAL KNEE ARTHROPLASTY Right 02/25/2013   Procedure: TOTAL KNEE ARTHROPLASTY;   Surgeon: Lorn Junes, MD;  Location: [facility];  Service: Orthopedics;  Laterality: Right;    Allergies  Allergen Reactions  . Aurothioglucose [Solganal] Other (See Comments)    REACTION: VASCULITIS   . Feldene [Piroxicam] Anaphylaxis and Other (See Comments)    SKIN BLISTERS  . Hydralazine Other (See Comments)    REACTION: Chest pains and headaches: IV only  . Imdur [Isosorbide Mononitrate] Other (See Comments)    REACTION: PATIENT IS UNABLE TO WALK OR FUNCTION  . Imuran [Azathioprine] Other (See Comments)    Could not walk or function  . Methotrexate Derivatives Other (See Comments)    REACTION: VASCULITIS  . Calcium Channel Blockers Palpitations  . Sulfa Antibiotics Diarrhea and Nausea And Vomiting  . Thimerosal Other (See Comments)    SKIN BLISTERED  . Vioxx [Rofecoxib] Other (See Comments)    REACTION: CAUSED MORE JOINT PAIN  . Arava [Leflunomide]     UNSPECIFIED REACTION   . Asacol [Mesalamine]     UNSPECIFIED REACTION   . Iohexol Other (See Comments)    PT STATES SHE WAS TOLD NEVER TO HAVE CT CONTRAST AGAIN.  SHE HAD SOME KIND OF REACTION BUT DOESNT REMEBER WHAT HAPPENED   . Hydrocodone Itching  . Latex Rash  . Neomycin Rash  . Nickel Rash  . Nsaids Other (See Comments)    GI UPSET , CAN TOLERATE SOME NSAIDS  . Percocet [Oxycodone-Acetaminophen] Other (See Comments)    INSOMNIA    Outpatient Encounter Medications as of 03/28/2017  Medication Sig  . acetaminophen (TYLENOL) 325 MG tablet Take 2 tablets (650 mg total) by mouth every 6 (six) hours as needed for mild pain (or Fever >/= 101).  Marland Kitchen allopurinol (ZYLOPRIM) 300 MG tablet Take 300 mg by mouth daily.   Marland Kitchen atorvastatin (LIPITOR) 20 MG tablet Take 20 mg by mouth at bedtime.  Roseanne Kaufman [place]-Castor Oil (VENELEX) OINT Apply to sacrum and bilateral buttocks q shift and prn  . Calcium Carbonate-Vit D-Min (CALTRATE 600+D PLUS) 600-400 MG-UNIT per tablet Chew 1 tablet by mouth 2 (two) times daily.    . Cholecalciferol  (VITAMIN D) 2000 UNITS tablet Take 2,000 Units by mouth daily.   Marland Kitchen docusate sodium (COLACE) 100 MG capsule Take 1 capsule (100 mg total) by mouth 2 (two) times daily.  . DULoxetine (CYMBALTA) 60 MG capsule Take 60 mg by mouth daily.  Marland Kitchen HYDROmorphone (DILAUDID) 2 MG tablet Take 1-2 tablets (2-4 mg total) by mouth every 6 (six) hours as needed for severe pain (breakthrough pain).  . hydroxychloroquine (PLAQUENIL) 200 MG tablet Take 200 mg by mouth daily.   Marland Kitchen ipratropium (ATROVENT) 0.06 % nasal spray Place 2 sprays into both nostrils  2 (two) times daily as needed for rhinitis.  Marland Kitchen LORazepam (ATIVAN) 0.5 MG tablet Take 1 tablet (0.5 mg total) by mouth every 6 (six) hours as needed for anxiety (Nausea or vomiting).  Marland Kitchen MAGNESIUM OXIDE PO Take 500 mg by mouth daily.   . methylPREDNISolone (MEDROL) 8 MG tablet Take 8 mg by mouth daily.  . metoprolol tartrate (LOPRESSOR) 25 MG tablet Take 2 tablets (50 mg total) by mouth 2 (two) times daily.  Marland Kitchen morphine (MS CONTIN) 30 MG 12 hr tablet Take 1 tablet (30 mg total) by mouth 3 (three) times daily at 8am, 2pm and bedtime. Administered at 8a, 2pm, and 9pm  . omeprazole (PRILOSEC) 40 MG capsule TAKE (1) CAPSULE BY MOUTH ONCE DAILY.  Vladimir Faster Glycol-Propyl Glycol (SYSTANE) 0.4-0.3 % GEL Apply 1 application to eye daily as needed (for dry eyes).   . polyethylene glycol (MIRALAX) packet Take 17 g by mouth daily.  . prochlorperazine (COMPAZINE) 10 MG tablet Take 1 tablet (10 mg total) by mouth every 6 (six) hours as needed (Nausea or vomiting).  Alveda Reasons 20 MG TABS tablet Take 1 tablet by mouth daily.   No facility-administered encounter medications on file as of 03/28/2017.      Review of Systems  Unable to perform ROS: Other (Patient confused and Does not make any sense)     There is no immunization history on file for this patient. Pertinent  Health Maintenance Due  Topic Date Due  . FOOT EXAM  04/21/2017 (Originally 03/12/1960)  . MAMMOGRAM  04/21/2017  (Originally 03/12/1999)  . OPHTHALMOLOGY EXAM  04/21/2017 (Originally 03/12/1960)  . URINE MICROALBUMIN  04/21/2017 (Originally 03/12/1960)  . DEXA SCAN  04/21/2017 (Originally 03/11/2014)  . PNA vac Low Risk Adult (1 of 2 - PCV13) 04/21/2017 (Originally 03/11/2014)  . INFLUENZA VACCINE  09/28/2017 (Originally 09/14/2016)  . HEMOGLOBIN A1C  08/24/2017  . COLONOSCOPY  06/25/2025   Fall Risk  02/27/2017 02/15/2017  Falls in the past year? No No  Risk for fall due to : Impaired balance/gait Impaired balance/gait;Medication side effect   Functional Status Survey:    Vitals:   03/28/17 1037  BP: (!) 119/55  Pulse: 90  Resp: 16  Temp: 98.4 F (36.9 C)  TempSrc: Oral  SpO2: 94%   There is no height or weight on file to calculate BMI. Physical Exam  Constitutional: She appears well-developed and well-nourished.  HENT:  Head: Normocephalic.  Mouth/Throat: Oropharynx is clear and moist.  Eyes: Pupils are equal, round, and reactive to light.  Neck: Neck supple.  Cardiovascular: Normal rate and normal heart sounds.  No murmur heard. Pulmonary/Chest: Effort normal and breath sounds normal. No respiratory distress. She has no wheezes. She has no rales.  Abdominal: Soft. Bowel sounds are normal. She exhibits no distension. There is no tenderness. There is no rebound.  Musculoskeletal: She exhibits edema.  Neurological: She is alert.  Does not follow any commands    Labs reviewed: Recent Labs    03/19/17 1627  03/22/17 0446 03/24/17 0615 03/26/17 0825  NA 137   < > 137 136 140  K 3.7   < > 4.0 3.6 3.7  CL 99*   < > 99* 98* 101  CO2 24   < > 24 29 28   GLUCOSE 141*   < > 98 117* 120*  BUN 18   < > 9 8 8   CREATININE 0.78   < > 0.57 0.57 0.55  CALCIUM 9.4   < > 9.8 9.7  9.8  MG 1.7  --   --   --  1.8   < > = values in this interval not displayed.   Recent Labs    03/14/17 1044 03/16/17 0416  AST 28 23  ALT 81* 58*  ALKPHOS 259* 271*  BILITOT 0.4 0.7  PROT 6.0* 6.2*  ALBUMIN  3.0* 3.1*   Recent Labs    03/14/17 1044  03/19/17 1627 03/20/17 0246 03/24/17 0615  WBC 18.9*   < > 16.6* 13.2* 6.8  NEUTROABS 14.6  --  14.1*  --  5.1  HGB 8.4*   < > 9.4* 8.6* 9.2*  HCT 31.0*   < > 33.5* 30.2* 33.2*  MCV 79.5   < > 78.1 76.6* 78.3  PLT 282   < > 217 202 180   < > = values in this interval not displayed.   Lab Results  Component Value Date   TSH 0.225 (L) 03/19/2017   Lab Results  Component Value Date   HGBA1C 6.9 (H) 02/24/2017   Lab Results  Component Value Date   CHOL (H) 02/23/2010    240        ATP III CLASSIFICATION:  <200     mg/dL   Desirable  200-239  mg/dL   Borderline High  >=240    mg/dL   High          HDL 58 02/23/2010   LDLCALC (H) 02/23/2010    132        Total Cholesterol/HDL:CHD Risk Coronary Heart Disease Risk Table                     Men   Women  1/2 Average Risk   3.4   3.3  Average Risk       5.0   4.4  2 X Average Risk   9.6   7.1  3 X Average Risk  23.4   11.0        Use the calculated Patient Ratio above and the CHD Risk Table to determine the patient's CHD Risk.        ATP III CLASSIFICATION (LDL):  <100     mg/dL   Optimal  100-129  mg/dL   Near or Above                    Optimal  130-159  mg/dL   Borderline  160-189  mg/dL   High  >190     mg/dL   Very High   TRIG 248 (H) 02/23/2010   CHOLHDL 4.1 02/23/2010    Significant Diagnostic Results in last 30 days:  Mr Virgel Paling GU Contrast  Result Date: 03/10/2017 CLINICAL DATA:  Stroke follow-up. History of aneurysm coiling. Weakness and difficulty walking. Cervical and breast cancer history. EXAM: MRA NECK WITHOUT CONTRAST MRA HEAD WITHOUT CONTRAST TECHNIQUE: Multiplanar and multiecho pulse sequences of the neck were obtained without intravenous contrast. Angiographic images of the neck were obtained using MRA technique without intravenous contrast.; Angiographic images of the Circle of Willis were obtained using MRA technique without intravenous contrast.  COMPARISON:  None. FINDINGS: MRA NECK FINDINGS Normal three-vessel aortic arch branching pattern. The visualized portions of the subclavian arteries are normal. Both vertebral artery origins are normal. The vertebral system is left dominant. Moderate narrowing of the left vertebral artery mid V2 segment (series 6, image 128). The remainder of the left vertebral artery course is normal. No focal stenosis of the right vertebral  artery. There is moderate atherosclerotic plaque at the right carotid bifurcation causing approximately 50% narrowing of the distal right common carotid artery. No hemodynamically significant stenosis of the right internal carotid artery. Atherosclerotic plaque at the left carotid bifurcation extending into the left internal carotid artery results in critical stenosis of the left ICA with severe narrowing along the remainder of the cervical internal carotid artery course. This has worsened slightly relative to 05/25/2011. MRA HEAD FINDINGS Intracranial internal carotid arteries: Minimal flow related enhancement of the left internal carotid artery to the ophthalmic segment. No flow related enhancement within the left carotid terminus. No right internal carotid artery stenosis. Status post coiling of right P-comm origin aneurysm. No residual aneurysm filling. Anterior cerebral arteries: Normal right anterior cerebral artery. There is no flow related enhancement within the left ACA A1 segment. Enhancement of the left A2 segment is via the anterior communicating artery. Middle cerebral arteries: Normal right MCA. No flow related enhancement in the left MCA M1 segment. Unchanged degree of collateralization in the distal left MCA distribution. Posterior communicating arteries: Patent on the left. Incomplete on the right. Status post coiling of right P-comm origin aneurysm. Posterior cerebral arteries: Normal. Basilar artery: Normal. Vertebral arteries: Left dominant. Normal. Superior cerebellar  arteries: Normal. Anterior inferior cerebellar arteries: Normal. Posterior inferior cerebellar arteries: Normal. IMPRESSION: 1. Critical stenosis of the left internal carotid artery, slightly worsened at the left ICA origin, but otherwise unchanged with minimal flow related enhancement to the level of the ophthalmic segment and no enhancement beyond. No right ICA stenosis. 2. Unchanged occlusion of the left anterior cerebral artery A 1 segment and left middle cerebral artery M 1 segment. 3. Unchanged moderate narrowing of the mid P 2 segment of the left vertebral artery. This may be exaggerated by changes in flow direction on this time-of-flight study. 4. Status post coiling of right P-comm origin aneurysm without residual aneurysm filling. Electronically Signed   By: Ulyses Jarred M.D.   On: 03/10/2017 14:27   Mr Jodene Nam Neck Wo Contrast  Result Date: 03/10/2017 CLINICAL DATA:  Stroke follow-up. History of aneurysm coiling. Weakness and difficulty walking. Cervical and breast cancer history. EXAM: MRA NECK WITHOUT CONTRAST MRA HEAD WITHOUT CONTRAST TECHNIQUE: Multiplanar and multiecho pulse sequences of the neck were obtained without intravenous contrast. Angiographic images of the neck were obtained using MRA technique without intravenous contrast.; Angiographic images of the Circle of Willis were obtained using MRA technique without intravenous contrast. COMPARISON:  None. FINDINGS: MRA NECK FINDINGS Normal three-vessel aortic arch branching pattern. The visualized portions of the subclavian arteries are normal. Both vertebral artery origins are normal. The vertebral system is left dominant. Moderate narrowing of the left vertebral artery mid V2 segment (series 6, image 128). The remainder of the left vertebral artery course is normal. No focal stenosis of the right vertebral artery. There is moderate atherosclerotic plaque at the right carotid bifurcation causing approximately 50% narrowing of the distal right  common carotid artery. No hemodynamically significant stenosis of the right internal carotid artery. Atherosclerotic plaque at the left carotid bifurcation extending into the left internal carotid artery results in critical stenosis of the left ICA with severe narrowing along the remainder of the cervical internal carotid artery course. This has worsened slightly relative to 05/25/2011. MRA HEAD FINDINGS Intracranial internal carotid arteries: Minimal flow related enhancement of the left internal carotid artery to the ophthalmic segment. No flow related enhancement within the left carotid terminus. No right internal carotid artery stenosis. Status post coiling  of right P-comm origin aneurysm. No residual aneurysm filling. Anterior cerebral arteries: Normal right anterior cerebral artery. There is no flow related enhancement within the left ACA A1 segment. Enhancement of the left A2 segment is via the anterior communicating artery. Middle cerebral arteries: Normal right MCA. No flow related enhancement in the left MCA M1 segment. Unchanged degree of collateralization in the distal left MCA distribution. Posterior communicating arteries: Patent on the left. Incomplete on the right. Status post coiling of right P-comm origin aneurysm. Posterior cerebral arteries: Normal. Basilar artery: Normal. Vertebral arteries: Left dominant. Normal. Superior cerebellar arteries: Normal. Anterior inferior cerebellar arteries: Normal. Posterior inferior cerebellar arteries: Normal. IMPRESSION: 1. Critical stenosis of the left internal carotid artery, slightly worsened at the left ICA origin, but otherwise unchanged with minimal flow related enhancement to the level of the ophthalmic segment and no enhancement beyond. No right ICA stenosis. 2. Unchanged occlusion of the left anterior cerebral artery A 1 segment and left middle cerebral artery M 1 segment. 3. Unchanged moderate narrowing of the mid P 2 segment of the left vertebral  artery. This may be exaggerated by changes in flow direction on this time-of-flight study. 4. Status post coiling of right P-comm origin aneurysm without residual aneurysm filling. Electronically Signed   By: Ulyses Jarred M.D.   On: 03/10/2017 14:27   US Biopsy (liver)  Result Date: 03/16/2017 INDICATION: Metastatic lung cancer, hepatic mass EXAM: ULTRASOUND GUIDED CORE BIOPSY OF RIGHT HEPATIC MASS MEDICATIONS: 1% LIDOCAINE LOCAL ANESTHESIA/SEDATION: Versed 2.0mg  IV; Fentanyl 111mcg IV; Moderate Sedation Time:  11 MINUTES The patient was continuously monitored during the procedure by the interventional radiology nurse under my direct supervision. FLUOROSCOPY TIME:  Fluoroscopy Time: NONE. COMPLICATIONS: None immediate. PROCEDURE: The procedure, risks, benefits, and alternatives were explained to the patient. Questions regarding the procedure were encouraged and answered. The patient understands and consents to the procedure. Previous imaging reviewed. Patient positioned right anterior oblique. Preliminary ultrasound performed. The 3.6 cm hypoechoic solid lesion in the right hepatic dome was localized. Overlying skin marked. Under sterile conditions and local anesthesia, a 17 gauge 11.8 cm access needle was advanced percutaneously into the lesion. Needle position confirmed with ultrasound. Images obtained for documentation. 3 18 gauge core biopsies obtained. Samples placed in formalin. Needle tract occluded with Gel-Foam. Postprocedure imaging demonstrates no hemorrhage or hematoma. Patient tolerated the biopsy well. FINDINGS: Imaging confirms needle placed into the right hepatic dome lesion for core biopsy IMPRESSION: Successful ultrasound right hepatic dome mass 18 gauge core biopsy Electronically Signed   By: Jerilynn Mages.  Shick M.D.   On: 03/16/2017 13:03   Ir US Guide Vasc Access Right  Result Date: 03/08/2017 INDICATION: 67 year old with metastatic non-small cell lung cancer. Planning for chemotherapy. EXAM:  FLUOROSCOPIC AND ULTRASOUND GUIDED PLACEMENT OF A SUBCUTANEOUS PORT COMPARISON:  None. MEDICATIONS: Ancef 2 g; The antibiotic was administered within an appropriate time interval prior to skin puncture. ANESTHESIA/SEDATION: Versed 2.0 mg IV; Fentanyl 100 mcg IV; Moderate Sedation Time:  36 minutes The patient was continuously monitored during the procedure by the interventional radiology nurse under my direct supervision. FLUOROSCOPY TIME:  18 seconds, 3 mGy COMPLICATIONS: None immediate. PROCEDURE: The procedure, risks, benefits, and alternatives were explained to the patient. Questions regarding the procedure were encouraged and answered. The patient understands and consents to the procedure. Patient was placed supine on the interventional table. Ultrasound confirmed a patent right internal jugular vein. The right chest and neck were cleaned with a skin antiseptic and a sterile drape was  placed. Maximal barrier sterile technique was utilized including caps, mask, sterile gowns, sterile gloves, sterile drape, hand hygiene and skin antiseptic. The right neck was anesthetized with 1% lidocaine. Small incision was made in the right neck with a blade. Micropuncture set was placed in the right internal jugular vein with ultrasound guidance. The micropuncture wire was used for measurement purposes. The right chest was anesthetized with 1% lidocaine with epinephrine. #15 blade was used to make an incision and a subcutaneous port pocket was formed. [place] was assembled. Subcutaneous tunnel was formed with a stiff tunneling device. The port catheter was brought through the subcutaneous tunnel. The port was placed in the subcutaneous pocket and sutured in place. The micropuncture set was exchanged for a peel-away sheath. The catheter was placed through the peel-away sheath and the tip was positioned at the superior cavoatrial junction. Catheter placement was confirmed with fluoroscopy. The port was accessed and  flushed with heparinized saline. The port pocket was closed using two layers of absorbable sutures and Dermabond. The vein skin site was closed using a single layer of absorbable suture and Dermabond. Sterile dressings were applied. Patient tolerated the procedure well without an immediate complication. Ultrasound and fluoroscopic images were taken and saved for this procedure. IMPRESSION: Placement of a subcutaneous port device. Electronically Signed   By: Markus Daft M.D.   On: 03/08/2017 16:54   Dg Chest Port 1 View  Result Date: 03/19/2017 CLINICAL DATA:  Heart palpitations. Metastatic cancer. Previously treated breast cancer. EXAM: PORTABLE CHEST 1 VIEW COMPARISON:  01/18/2017.  PET-CT dated 02/06/2017. FINDINGS: Poor inspiration. Borderline enlarged cardiac silhouette. Increased prominence of the pulmonary vasculature and interstitial markings. No visible pleural fluid. Left axillary surgical clips. Right jugular porta catheter tip in the superior vena cava. Mottled appearance of the right humeral neck. No abnormal activity at that location on the previous PET-CT. IMPRESSION: 1. Poor inspiration with interval borderline cardiomegaly, pulmonary vascular congestion and probable mild interstitial pulmonary edema. 2. Possible interval right humeral neck metastasis. Electronically Signed   By: Claudie Revering M.D.   On: 03/19/2017 17:19   Ir Fluoro Guide Port Insertion Right  Result Date: 03/08/2017 INDICATION: 67 year old with metastatic non-small cell lung cancer. Planning for chemotherapy. EXAM: FLUOROSCOPIC AND ULTRASOUND GUIDED PLACEMENT OF A SUBCUTANEOUS PORT COMPARISON:  None. MEDICATIONS: Ancef 2 g; The antibiotic was administered within an appropriate time interval prior to skin puncture. ANESTHESIA/SEDATION: Versed 2.0 mg IV; Fentanyl 100 mcg IV; Moderate Sedation Time:  36 minutes The patient was continuously monitored during the procedure by the interventional radiology nurse under my direct  supervision. FLUOROSCOPY TIME:  18 seconds, 3 mGy COMPLICATIONS: None immediate. PROCEDURE: The procedure, risks, benefits, and alternatives were explained to the patient. Questions regarding the procedure were encouraged and answered. The patient understands and consents to the procedure. Patient was placed supine on the interventional table. Ultrasound confirmed a patent right internal jugular vein. The right chest and neck were cleaned with a skin antiseptic and a sterile drape was placed. Maximal barrier sterile technique was utilized including caps, mask, sterile gowns, sterile gloves, sterile drape, hand hygiene and skin antiseptic. The right neck was anesthetized with 1% lidocaine. Small incision was made in the right neck with a blade. Micropuncture set was placed in the right internal jugular vein with ultrasound guidance. The micropuncture wire was used for measurement purposes. The right chest was anesthetized with 1% lidocaine with epinephrine. #15 blade was used to make an incision and a subcutaneous port  pocket was formed. Peculiar was assembled. Subcutaneous tunnel was formed with a stiff tunneling device. The port catheter was brought through the subcutaneous tunnel. The port was placed in the subcutaneous pocket and sutured in place. The micropuncture set was exchanged for a peel-away sheath. The catheter was placed through the peel-away sheath and the tip was positioned at the superior cavoatrial junction. Catheter placement was confirmed with fluoroscopy. The port was accessed and flushed with heparinized saline. The port pocket was closed using two layers of absorbable sutures and Dermabond. The vein skin site was closed using a single layer of absorbable suture and Dermabond. Sterile dressings were applied. Patient tolerated the procedure well without an immediate complication. Ultrasound and fluoroscopic images were taken and saved for this procedure. IMPRESSION: Placement of a  subcutaneous port device. Electronically Signed   By: Markus Daft M.D.   On: 03/08/2017 16:54    Assessment/Plan  Metastatic Lung cancer  According to Nurses she is spiting her Meds.  Patient is in Pain and uncomfortable. I have d/w Dr Sonny Dandy [facility])  and Patient Husband . Will Continue MSContin 30 mg Q 8  Discontinue Dilaudid Start Her on Roxanol Liquid 10 mg Q4 PRN At this time if patient refuses to go For Radiation then other option would be In patient admission in Hospice for Pain Control.   Family/ staff Communication:   Labs/tests ordered:

## 2017-03-28 NOTE — ED Notes (Addendum)
Patient orientated to self. Disorientated to time, situation and location. MD at bedside speaking with patient. CBG 137 patient also guarding right hip.

## 2017-03-28 NOTE — ED Triage Notes (Signed)
Patient brought to ED by [facility] stating patient was supposed to have radiation treatment but was unable to tolerate and decreased mental status since yesterday. Per staff pt was A&O yesterday however staff today noticed pt was not as alert at last treatment on Friday. Unable to give time of when status change. Also concerned that pt is having increased hip pain. Pt has stage 4 lung cancer that has mets to brain and bone. Given morphine and dilaudid prn at rehab facility where pt is currently residing.

## 2017-03-28 NOTE — ED Provider Notes (Addendum)
[facility] DEPT Provider Note   CSN: [id number] Arrival date & time: 03/28/17  1455     History   Chief Complaint Chief Complaint  Patient presents with  . Altered Mental Status    HPI [name] is a 67 y.o. female.  Complaint is altered level of consciousness.  HPI [name] has an unfortunate diagnosis of metastatic adenocarcinoma of the lung.  Has known metastasis to her right femur.  Originally was thought to have metastasis to brain.  However, follow-up imaging has shown areas in her brain to be ischemic strokes and not metastases.  She has not undergone radiation to her brain.  She has been transferred from [facility] in [place] for daily radiation therapy.  She completed a course 21 of 30 to her lung and pelvis yesterday.  Has been having increasing pain and not tolerating her treatments well.  Was getting Dilaudid prior to transfer.  This was changed to Roxanol today.  Nursing for Dr. Manning/radiotherapy noticed that her mental status was markedly different today.  She was minimally verbal.  She was confused.  Unable to follow conversation.  The nurse to contact me states she had a full conversation with her yesterday.  She was unable to tolerate her treatment today because of right hip pain which is severe.  She was transferred to the emergency room for further evaluation of her altered mental status and worsening right hip pain.  Was seen in the emergency room 2/3 with new onset A. fib.  Was discharged 2/6 on Xarelto.    Past Medical History:  Diagnosis Date  . Anemia   . Angina   . Arthritis    Rheumatoid  . Brain cancer ([place])   . Breast cancer ([place])   . Cancer ([place])    left breast, cervical cancer  . Colon polyps   . Constipation   . Diabetes mellitus without complication ([place])   . Diarrhea   . Dysrhythmia   . GERD (gastroesophageal reflux disease)   . Headache   . Heart murmur   . History of kidney stones   .  Hypercholesterolemia   . Hypertension   . Lung cancer ([place])   . Numbness and tingling in right hand   . Numbness and tingling of both feet   . Peripheral vascular disease ([place])    cerebrel aneurysm /sp coil  . Pneumonia   . PONV (postoperative nausea and vomiting)   . Raynaud's syndrome   . Rheumatoid arthritis with rheumatoid factor (HCC)   . Right knee DJD   . Stroke [facility])    jan 2012  . VTE (venous thromboembolism)     Patient Active Problem List   Diagnosis Date Noted  . Pain management   . Diabetes ([place]) 03/19/2017  . Atrial fibrillation with RVR ([place]) 03/19/2017  . DVT (deep venous thrombosis) ([place]) 03/19/2017  . New onset a-fib ([place]) 03/19/2017  . Pain 03/15/2017  . Bone metastasis ([place]) 03/13/2017  . Palliative care by specialist   . DNR (do not resuscitate)   . Primary cancer of left upper lobe of lung ([place]) 02/27/2017  . Secondary malignant neoplasm of brain and spinal cord ([place]) 02/15/2017  . Venous insufficiency   . Venous intermittent claudication   . Diverticulitis of colon (without mention of hemorrhage)(562.11) 08/26/2013  . Postoperative anemia due to acute blood loss 02/26/2013  . Anemia, unspecified 02/26/2013  . DJD (degenerative joint disease) of knee 02/25/2013  . PONV (postoperative nausea and vomiting)   .  Stroke ([place])   . Hypercholesterolemia   . Metastatic cancer ([place])   . Colon polyps   . Arthritis   . Rheumatoid arthritis with rheumatoid factor (HCC)   . Right knee DJD   . Asthenia 08/14/2012  . Cerebral aneurysm, nonruptured 08/14/2012  . TIA (transient ischemic attack) 08/14/2012  . RA (rheumatoid arthritis) ([place]) 10/27/2011  . High cholesterol 10/27/2011  . Hypertension 10/27/2011  . GERD (gastroesophageal reflux disease) 10/27/2011    Past Surgical History:  Procedure Laterality Date  . ABDOMINAL HYSTERECTOMY  1977  . APPENDECTOMY  1964  . BILATERAL OOPHORECTOMY    . CHOLECYSTECTOMY    . COLONOSCOPY  12/09/2010   Procedure:  COLONOSCOPY;  Surgeon: Rogene Houston, MD;  [facility];  Service: Endoscopy;  Laterality: N/A;  . COLONOSCOPY N/A 06/26/2015   Procedure: COLONOSCOPY;  Surgeon: Rogene Houston, MD;  Location[facility];  Service: Endoscopy;  Laterality: N/A;  1:05-moved to [address] to notify pt  . ESOPHAGOGASTRODUODENOSCOPY N/A 06/26/2015   Procedure: ESOPHAGOGASTRODUODENOSCOPY (EGD);  Surgeon: Rogene Houston, MD;  Location[facility];  Service: Endoscopy;  Laterality: N/A;  . EYE SURGERY     cataract surgery bilat   . GIVENS CAPSULE STUDY N/A 08/20/2015   Procedure: GIVENS CAPSULE STUDY;  Surgeon: Rogene Houston, MD;  Location[facility];  Service: Endoscopy;  Laterality: N/A;  730  . IR FLUORO GUIDE PORT INSERTION RIGHT  03/08/2017  . IR US GUIDE VASC ACCESS RIGHT  03/08/2017  . left breast lumpectomy  2004  . left knee arthroscopy    . Right brain aneurysm  04/2010   Coiled   . right wrist surgery  1999   synevectomy  . TOTAL KNEE ARTHROPLASTY Right 02/25/2013   Procedure: TOTAL KNEE ARTHROPLASTY;  Surgeon: Lorn Junes, MD;  Location: [facility];  Service: Orthopedics;  Laterality: Right;    OB History    Gravida Para Term Preterm AB Living             0   SAB TAB Ectopic Multiple Live Births                   Home Medications    Prior to Admission medications   Medication Sig Start Date End Date Taking? Authorizing Provider  acetaminophen (TYLENOL) 325 MG tablet Take 2 tablets (650 mg total) by mouth every 6 (six) hours as needed for mild pain (or Fever >/= 101). 03/18/17  Yes Robbie Lis, MD  allopurinol (ZYLOPRIM) 300 MG tablet Take 300 mg by mouth daily.    Yes [provider]  atorvastatin (LIPITOR) 20 MG tablet Take 20 mg by mouth at bedtime.   Yes [provider]  Janne Lab Oil [facility]) OINT Apply to sacrum and bilateral buttocks q shift and prn   Yes [provider]  Calcium Carbonate-Vit D-Min (CALTRATE 600+D PLUS) 600-400  MG-UNIT per tablet Chew 1 tablet by mouth 2 (two) times daily.     Yes [provider]  Cholecalciferol (VITAMIN D) 2000 UNITS tablet Take 2,000 Units by mouth daily.    Yes [provider]  docusate sodium (COLACE) 100 MG capsule Take 1 capsule (100 mg total) by mouth 2 (two) times daily. 03/22/17 03/22/18 Yes Barton Dubois, MD  DULoxetine (CYMBALTA) 60 MG capsule Take 60 mg by mouth daily.   Yes [provider]  HYDROmorphone (DILAUDID) 2 MG tablet Take 1-2 tablets (2-4 mg total) by mouth every 6 (  six) hours as needed for severe pain (breakthrough pain). 03/24/17 03/24/18 Yes Lassen, Arlo C, PA-C  hydroxychloroquine (PLAQUENIL) 200 MG tablet Take 200 mg by mouth daily.    Yes [provider]  ipratropium (ATROVENT) 0.06 % nasal spray Place 2 sprays into both nostrils 2 (two) times daily as needed for rhinitis.   Yes [provider]  LORazepam (ATIVAN) 0.5 MG tablet Take 1 tablet (0.5 mg total) by mouth every 6 (six) hours as needed for anxiety (Nausea or vomiting). 03/24/17  Yes Oscar La, Arlo C, PA-C  Magnesium Oxide 250 MG TABS Take 500 mg by mouth daily with breakfast.   Yes [provider]  methylPREDNISolone (MEDROL) 4 MG tablet Take 8 mg by mouth daily with breakfast.   Yes [provider]  metoprolol tartrate (LOPRESSOR) 25 MG tablet Take 2 tablets (50 mg total) by mouth 2 (two) times daily. 03/23/17  Yes Barton Dubois, MD  morphine (MS CONTIN) 30 MG 12 hr tablet Take 1 tablet (30 mg total) by mouth 3 (three) times daily at 8am, 2pm and bedtime. Administered at 8a, 2pm, and 9pm 03/24/17  Yes Lassen, Arlo C, PA-C  morphine (ROXANOL) 20 MG/ML concentrated solution Take 10 mg by mouth every 4 (four) hours as needed for breakthrough pain.   Yes [provider]  morphine 20 MG/5ML solution Take by mouth once.   Yes [provider]  omeprazole (PRILOSEC) 40 MG capsule TAKE (1) CAPSULE BY MOUTH ONCE DAILY. 07/21/14  Yes Rehman, Mechele Dawley, MD  Polyethyl Glycol-Propyl Glycol (SYSTANE) 0.4-0.3 % GEL Apply 1 application to eye daily as needed (for dry eyes).    Yes [provider]  polyethylene glycol (MIRALAX) packet Take 17 g by mouth daily. 03/22/17  Yes Barton Dubois, MD  prochlorperazine (COMPAZINE) 10 MG tablet Take 1 tablet (10 mg total) by mouth every 6 (six) hours as needed (Nausea or vomiting). 02/27/17  Yes Perlov, Marinell Blight, MD  XARELTO 20 MG TABS tablet Take 1 tablet by mouth daily. 02/27/17  Yes [provider]    Family History Family History  Problem Relation Age of Onset  . Hypertension Father   . Heart attack Father   . Heart disease Maternal Uncle   . Cancer Maternal Aunt        breast  . Cancer Other        kidney  . Cancer Cousin   . Colon cancer Neg Hx     Social History Social History   Tobacco Use  . Smoking status: Former Smoker    Packs/day: 2.00    Years: 15.00    Pack years: 30.00    Last attempt to quit: 02/20/1984    Years since quitting: 33.1  . Smokeless tobacco: Never Used  . Tobacco comment: Quit 1986  Substance Use Topics  . Alcohol use: No  . Drug use: No     Allergies   Aurothioglucose [solganal]; Feldene [piroxicam]; Hydralazine; Imdur [isosorbide mononitrate]; Imuran [azathioprine]; Methotrexate derivatives; Calcium channel blockers; Sulfa antibiotics; Thimerosal; Vioxx [rofecoxib]; Arava [leflunomide]; Asacol [mesalamine]; Iohexol; Hydrocodone; Latex; Neomycin; Nickel; Nsaids; and Percocet [oxycodone-acetaminophen]   Review of Systems Review of Systems  Unable to perform ROS: Mental status change     Physical Exam Updated Vital Signs BP (!) 132/46 (BP Location: Left Arm)   Pulse (!) 103   Temp 97.6 F (36.4 C) (Axillary)   Resp 16   Ht 5\' 5"  (1.651 m)   Wt 70.8 kg (156 lb)   SpO2  95%   BMI 25.96 kg/m   Physical Exam  Constitutional:  Seen on arrival.  She is afebrile on a rectal temp of 98.6.  Blood sugar is 123.  She can repeat things  that I say to her but is not able to answer questions or follow conversation.  Her eyes are open and she is alert although confused  HENT:  Head: Normocephalic.  Mucous membranes are dry.  Conjunctive are pale.  Cardiovascular:  Not tachycardic at rest  Pulmonary/Chest:  Clear breath sounds  Abdominal:  Soft benign abdomen  Musculoskeletal:  Severe pain with any movement of the right hip, or lower extremity.  Neurological:  Can lift and hold her bilateral upper extremities.  She will not move either left lower extremity due to pain.  Skin:  No obvious ulcerations.  Sacrum not visualized.     ED Treatments / Results  Labs (all labs ordered are listed, but only abnormal results are displayed) Labs Reviewed  CBC WITH DIFFERENTIAL/PLATELET - Abnormal; Notable for the following components:      Result Value   RBC 3.17 (*)    Hemoglobin 7.2 (*)    HCT 24.7 (*)    MCV 77.9 (*)    MCH 22.7 (*)    MCHC 29.1 (*)    RDW 19.7 (*)    Lymphs Abs 0.4 (*)    All other components within normal limits  COMPREHENSIVE METABOLIC PANEL - Abnormal; Notable for the following components:   Chloride 99 (*)    Glucose, Bld 124 (*)    Total Protein 5.7 (*)    Albumin 2.4 (*)    Alkaline Phosphatase 159 (*)    All other components within normal limits  URINALYSIS, ROUTINE W REFLEX MICROSCOPIC - Abnormal; Notable for the following components:   Color, Urine AMBER (*)    Ketones, ur 5 (*)    All other components within normal limits  PROTIME-INR - Abnormal; Notable for the following components:   Prothrombin Time 17.2 (*)    All other components within normal limits  I-STAT CHEM 8, ED - Abnormal; Notable for the following components:   Sodium 134 (*)    Chloride 96 (*)    Glucose, Bld 124 (*)    Hemoglobin 7.1 (*)    HCT 21.0 (*)    All other components within normal limits  CULTURE, BLOOD (ROUTINE X 2)  CULTURE, BLOOD (ROUTINE X 2)  URINE CULTURE  I-STAT CG4 LACTIC ACID, ED    EKG   EKG Interpretation  Date/Time:  Tuesday March 28 2017 15:16:39 EST Ventricular Rate:  96 PR Interval:    QRS Duration: 79 QT Interval:  336 QTC Calculation: 425 R Axis:   1 Text Interpretation:  Sinus rhythm has replaced A. Fib noted on EKG 03/19/2017 Atrial premature complex Reconfirmed by Tanna Furry [phone]) on 03/28/2017 4:50:38 PM       Radiology Dg Pelvis 1-2 Views  Result Date: 03/28/2017 CLINICAL DATA:  Known femur metastasis.  Severe pain. EXAM: PELVIS - 1-2 VIEW COMPARISON:  PET CT 02/06/2017 FINDINGS: The previously seen pelvic and bilateral proximal femoral metastatic disease is not appreciated well by plain film. There is a mixed lytic and sclerotic area within the left femoral neck corresponding to lesion seen on prior PET CT. Fracture noted involving the lesser trochanter of the right femur, likely avulsion type. This could be related to pathologic fracture and metastasis although this cannot be confirmed by plain film. No subluxation or dislocation. IMPRESSION: Known  bony metastatic disease in the pelvis and femurs not as well visualized by plain film. Lesion within the left femoral neck is seen. Displaced avulsion fracture of the lesser trochanter on the right. Electronically Signed   By: Rolm Baptise M.D.   On: 03/28/2017 18:58   Ct Head Wo Contrast  Result Date: 03/28/2017 CLINICAL DATA:  Altered level of consciousness. History of metastatic adenocarcinoma of the lung with metastasis to the right femur. EXAM: CT HEAD WITHOUT CONTRAST TECHNIQUE: Contiguous axial images were obtained from the base of the skull through the vertex without intravenous contrast. COMPARISON:  02/22/2010 CT, MRI 03/10/2017 FINDINGS: Images are slightly degraded by patient motion. Brain: Small vessel ischemic disease of periventricular white matter. No hydrocephalus. No intra-axial mass nor extra-axial fluid collections are visualized. No large vascular territory infarction. Midline fourth ventricle and  basal cisterns. Vascular: Metallic streak artifacts emanating from the expected location of the right MCA compatible with history of aneurysm clipping. Skull: No acute fracture or suspicious osseous lesions. Stable appearance of the bony skull dating back to 2012. Small rounded hypodensities within the diaper plug space are stable dating back to the previous comparison and likely reflect benign findings. No frank bone destruction is seen. Sinuses/Orbits: No acute finding. Other: None IMPRESSION: Chronic stable microvascular ischemic disease of periventricular white matter. No acute intracranial abnormality. Electronically Signed   By: Ashley Royalty M.D.   On: 03/28/2017 18:34   Dg Chest Port 1 View  Result Date: 03/28/2017 CLINICAL DATA:  Altered mental status.  History of lung cancer. EXAM: PORTABLE CHEST 1 VIEW COMPARISON:  Chest x-ray dated March 19, 2017. FINDINGS: Unchanged right chest wall port catheter with tip in the distal SVC. The heart size and mediastinal contours are within normal limits. Normal pulmonary vascularity. No focal consolidation, pleural effusion, or pneumothorax. No acute osseous abnormality. IMPRESSION: No active disease. Electronically Signed   By: Titus Dubin M.D.   On: 03/28/2017 16:50   Dg Femur Min 2 Views Right  Result Date: 03/28/2017 CLINICAL DATA:  Increasing pelvic and right upper leg pain. Known metastatic disease. EXAM: RIGHT FEMUR 2 VIEWS COMPARISON:  PET CT 02/06/2017 FINDINGS: Previously seen proximal right femoral bone metastasis not well visualized. There is a displaced fracture involving the right femoral lesser trochanter. No fracture cross the femoral neck. Prior right knee replacement. IMPRESSION: Avulsion fracture of the lesser trochanter. No femoral neck fracture. Previously seen known bony metastatic disease not well visualized by plain film. Electronically Signed   By: Rolm Baptise M.D.   On: 03/28/2017 19:02    Procedures Procedures (including  critical care time)  Medications Ordered in ED Medications  fentaNYL (SUBLIMAZE) injection 50 mcg (not administered)  ondansetron (ZOFRAN) injection 4 mg (0 mg Intravenous Not Given 03/28/17 2024)     Initial Impression / Assessment and Plan / ED Course  I have reviewed the triage vital signs and the nursing notes.  Pertinent labs & imaging results that were available during my care of the patient were reviewed by me and considered in my medical decision making (see chart for details).     Plan CT of the head.  Plan additional labs.  Appears pale.  She is newly on anticoagulation.  Will need stool guaiac testing.  Plan reevaluation after studies.    Final Clinical Impressions(s) / ED Diagnoses   Final diagnoses:  Primary malignant neoplasm of lung metastatic to other site, unspecified laterality [facility])    Patient studies unrevealing for acute process.  I  received a phone call from [place] [employer].  His oncologist the medical director of hospice of rocking [facility].  He is been following the patient and her case.  After long discussion he felt that the best course of action would be to discontinue her palliative radiation therapy and elect for more aggressive hospice care.  He will arrange for this.  He asked that I contact Dr. group to the patient's physician at [facility] and request hospice consultation.  Patient will be discharged and transferred via ambulance back to [facility] with symptom control before, and during transport.  ED Discharge Orders    None       Tanna Furry, MD 03/28/17 1705    Tanna Furry, MD 03/28/17 2039

## 2017-03-29 ENCOUNTER — Ambulatory Visit: Payer: Medicare Other

## 2017-03-29 ENCOUNTER — Non-Acute Institutional Stay (SKILLED_NURSING_FACILITY): Payer: Medicare Other | Admitting: Internal Medicine

## 2017-03-29 ENCOUNTER — Encounter: Payer: Self-pay | Admitting: Radiation Oncology

## 2017-03-29 ENCOUNTER — Encounter: Payer: Self-pay | Admitting: Internal Medicine

## 2017-03-29 DIAGNOSIS — I4891 Unspecified atrial fibrillation: Secondary | ICD-10-CM | POA: Diagnosis not present

## 2017-03-29 DIAGNOSIS — D649 Anemia, unspecified: Secondary | ICD-10-CM | POA: Diagnosis not present

## 2017-03-29 DIAGNOSIS — C799 Secondary malignant neoplasm of unspecified site: Secondary | ICD-10-CM

## 2017-03-29 LAB — CBG MONITORING, ED: Glucose-Capillary: 137 mg/dL — ABNORMAL HIGH (ref 65–99)

## 2017-03-29 NOTE — Progress Notes (Signed)
Location:   [facility] Room Number: 155/P Place of Service:  SNF [phone]) Provider:  Lucretia Field, MD  Patient Care Team: Jani Gravel, MD as PCP - General (Internal Medicine)  Extended Emergency Contact Information Primary Emergency Contact: Ambrose Pancoast Address: [address] [place]          [place], [place] [address] [place] Phone: [phone] Mobile Phone: [phone] Relation: Spouse  Code Status:  DNR Goals of care: Advanced Directive information Advanced Directives 03/29/2017  Does Patient Have a Medical Advance Directive? Yes  Type of Advance Directive Out of facility DNR (pink MOST or yellow form)  Does patient want to make changes to medical advance directive? No - Patient declined  Copy of [place] in Chart? No - copy requested  Would patient like information on creating a medical advance directive? No - Patient declined  Pre-existing out of facility DNR order (yellow form or pink MOST form) -     Chief Complaint  Patient presents with  . Discharge Note    Discharge Visit    HPI:  Pt is a 67 y.o. female seen today for an acute visit for ER visit yesterday.  Patient has a diagnosis of metastatic adenocarcinoma of the lung with known metastasis to the right femur- recently was thought to have metastasis to the brain but follow-up imaging has not shown that it did show ischemic strokes.  She has been receiving radiation but not to her brain.  She was transferred to the ER yesterday from her radiology therapy secondary to pain management and confusion.  She had been having increasing pain and did not tolerate her treatments well-at one point was given Dilaudid but that was changed to Roxanol by Dr. Dorinda Hill to concerns of increased pain  Physician at radiology did note that she had increased confusion and was minimally verbal unable to follow conversation.  And was unable to tolerate  her radiation treatment yesterday.  Subsequently she was sent to the ER for evaluation of her altered mental status and worsening right hip pain  ER workup did not really show any acute changes hemoglobin was 7.2 which was slightly down from her recent baseline.  CT of the head did not show any acute process pelvic x-ray did show known metastatic disease in the pelvis and femur area.  Checks x-ray did not show any acute process.  She appears she did receive a fentanyl injection in the ER as well as Zofran for nausea. Workup in the ER however did not really show any acute process A discussion was had between the ER physician and Dr. Quincy Simmonds was felt the best course of action would be to discontinue her palliative radiation therapy and elected for aggressive hospice care.  Apparently this has been expediently followed up on  she will be transferred to the hospice house this afternoon.   Currently she is still complaining at times of pain but her mental status apparently has improved per her family at bedside.  Vital signs appear to be stable  although it has been difficult taking her blood pressure because she complains of significant pain when it is taken.  She does not appear to be in any distress.  Regards to pain management again will defer to hospice when she arrives there but she is currently on morphine 30 mg 3 times daily as well as Roxanol 10 mg every 4 hours for breakthrough pain.  She also  has Ativan 0.5 mg every 6 hours as needed        .     Past Medical History:  Diagnosis Date  . Anemia   . Angina   . Arthritis    Rheumatoid  . Brain cancer ([facility])   . Breast cancer ([facility])   . Cancer ([facility])    left breast, cervical cancer  . Colon polyps   . Constipation   . Diabetes mellitus without complication ([facility])   . Diarrhea   . Dysrhythmia   . GERD (gastroesophageal reflux disease)   . Headache   . Heart murmur   . History of kidney stones   .  Hypercholesterolemia   . Hypertension   . Lung cancer ([facility])   . Numbness and tingling in right hand   . Numbness and tingling of both feet   . Peripheral vascular disease ([facility])    cerebrel aneurysm /sp coil  . Pneumonia   . PONV (postoperative nausea and vomiting)   . Raynaud's syndrome   . Rheumatoid arthritis with rheumatoid factor (HCC)   . Right knee DJD   . Stroke [facility])    jan 2012  . VTE (venous thromboembolism)    Past Surgical History:  Procedure Laterality Date  . ABDOMINAL HYSTERECTOMY  1977  . APPENDECTOMY  1964  . BILATERAL OOPHORECTOMY    . CHOLECYSTECTOMY    . COLONOSCOPY  12/09/2010   Procedure: COLONOSCOPY;  Surgeon: Rogene [place], MD;  [facility];  Service: Endoscopy;  Laterality: N/A;  . COLONOSCOPY N/A 06/26/2015   Procedure: COLONOSCOPY;  Surgeon: Rogene [place], MD;  [facility];  Service: Endoscopy;  Laterality: N/A;  1:05-moved to [address] to notify pt  . ESOPHAGOGASTRODUODENOSCOPY N/A 06/26/2015   Procedure: ESOPHAGOGASTRODUODENOSCOPY (EGD);  Surgeon: Rogene [place], MD;  [facility];  Service: Endoscopy;  Laterality: N/A;  . EYE SURGERY     cataract surgery bilat   . GIVENS CAPSULE STUDY N/A 08/20/2015   Procedure: GIVENS CAPSULE STUDY;  Surgeon: Rogene [place], MD;  [facility];  Service: Endoscopy;  Laterality: N/A;  730  . IR FLUORO GUIDE PORT INSERTION RIGHT  03/08/2017  . IR US GUIDE VASC ACCESS RIGHT  03/08/2017  . left breast lumpectomy  2004  . left knee arthroscopy    . Right brain aneurysm  04/2010   Coiled   . right wrist surgery  1999   synevectomy  . TOTAL KNEE ARTHROPLASTY Right 02/25/2013   Procedure: TOTAL KNEE ARTHROPLASTY;  Surgeon: Lorn Junes, MD;  Location: [facility];  Service: Orthopedics;  Laterality: Right;    Allergies  Allergen Reactions  . Aurothioglucose [Solganal] Other (See Comments)    REACTION: VASCULITIS   . Feldene [Piroxicam] Anaphylaxis and Other (See Comments)     SKIN BLISTERS  . Hydralazine Other (See Comments)    REACTION: Chest pains and headaches: IV only  . Imdur [Isosorbide Mononitrate] Other (See Comments)    REACTION: PATIENT IS UNABLE TO WALK OR FUNCTION  . Imuran [Azathioprine] Other (See Comments)    Could not walk or function  . Methotrexate Derivatives Other (See Comments)    REACTION: VASCULITIS  . Calcium Channel Blockers Palpitations  . Sulfa Antibiotics Diarrhea and Nausea And Vomiting  . Thimerosal Other (See Comments)    SKIN BLISTERED  . Vioxx [Rofecoxib] Other (See Comments)    REACTION: CAUSED MORE JOINT PAIN  . Arava [Leflunomide]     UNSPECIFIED REACTION   .  Asacol [Mesalamine]     UNSPECIFIED REACTION   . Iohexol Other (See Comments)    PT STATES SHE WAS TOLD NEVER TO HAVE CT CONTRAST AGAIN.  SHE HAD SOME KIND OF REACTION BUT DOESNT REMEBER WHAT HAPPENED   . Hydrocodone Itching  . Latex Rash  . Neomycin Rash  . Nickel Rash  . Nsaids Other (See Comments)    GI UPSET , CAN TOLERATE SOME NSAIDS  . Percocet [Oxycodone-Acetaminophen] Other (See Comments)    INSOMNIA    Outpatient Encounter Medications as of 03/29/2017  Medication Sig  . acetaminophen (TYLENOL) 325 MG tablet Take 2 tablets (650 mg total) by mouth every 6 (six) hours as needed for mild pain (or Fever >/= 101).  Marland Kitchen allopurinol (ZYLOPRIM) 300 MG tablet Take 300 mg by mouth daily.   Marland Kitchen atorvastatin (LIPITOR) 20 MG tablet Take 20 mg by mouth at bedtime.  Roseanne Kaufman [place]-Castor Oil (VENELEX) OINT Apply to sacrum and bilateral buttocks q shift and prn  . Calcium Carbonate-Vit D-Min (CALTRATE 600+D PLUS) 600-400 MG-UNIT per tablet Chew 1 tablet by mouth 2 (two) times daily.    . Cholecalciferol (VITAMIN D) 2000 UNITS tablet Take 2,000 Units by mouth daily.   Marland Kitchen docusate sodium (COLACE) 100 MG capsule Take 1 capsule (100 mg total) by mouth 2 (two) times daily.  . DULoxetine (CYMBALTA) 60 MG capsule Take 60 mg by mouth daily.  . hydroxychloroquine (PLAQUENIL) 200  MG tablet Take 200 mg by mouth daily.   Marland Kitchen ipratropium (ATROVENT) 0.06 % nasal spray Place 2 sprays into both nostrils 2 (two) times daily as needed for rhinitis.  Marland Kitchen LORazepam (ATIVAN) 0.5 MG tablet Take 1 tablet (0.5 mg total) by mouth every 6 (six) hours as needed for anxiety (Nausea or vomiting).  . Magnesium Oxide 250 MG TABS Take 500 mg by mouth daily with breakfast.  . methylPREDNISolone (MEDROL) 4 MG tablet Take 8 mg by mouth daily with breakfast.  . metoprolol tartrate (LOPRESSOR) 25 MG tablet Take 2 tablets (50 mg total) by mouth 2 (two) times daily.  Marland Kitchen morphine (MS CONTIN) 30 MG 12 hr tablet Take 1 tablet (30 mg total) by mouth 3 (three) times daily at 8am, 2pm and bedtime. Administered at 8a, 2pm, and 9pm  . morphine (ROXANOL) 20 MG/ML concentrated solution Take 10 mg by mouth every 4 (four) hours as needed for breakthrough pain.  Marland Kitchen omeprazole (PRILOSEC) 40 MG capsule TAKE (1) CAPSULE BY MOUTH ONCE DAILY.  Vladimir Faster Glycol-Propyl Glycol (SYSTANE) 0.4-0.3 % GEL Apply 1 application to eye daily as needed (for dry eyes).   . polyethylene glycol (MIRALAX) packet Take 17 g by mouth daily.  . prochlorperazine (COMPAZINE) 10 MG tablet Take 1 tablet (10 mg total) by mouth every 6 (six) hours as needed (Nausea or vomiting).  Alveda Reasons 20 MG TABS tablet Take 1 tablet by mouth daily.  . [DISCONTINUED] HYDROmorphone (DILAUDID) 2 MG tablet Take 1-2 tablets (2-4 mg total) by mouth every 6 (six) hours as needed for severe pain (breakthrough pain).  . [DISCONTINUED] morphine 20 MG/5ML solution Take by mouth once.   No facility-administered encounter medications on file as of 03/29/2017.     Review of Systems    In general she is not complain of fever chills says she feels generally "pretty bad skin is not complaining of diaphoresis.  Head ears eyes nose mouth and throat"  Is not really complaining of visual changes or sore throat.  Respiratory is not complaining of shortness of  breath or  cough.  Cardiac says she has generalized pain but not specifically complaining of chest pain does not have significant lower extremity edema.  GI is not complaining currently of nausea but has had this previously does not complain of acute abdominal discomfort.  GU is not complaining of dysuria.  Musculoskeletal is complaining of significant pain this appears more so of her hip and legs.  Neurologic does not complain of dizziness or numbness or syncope.  Psych per family she is more alert today she is pleasant and appropriate   There is no immunization history on file for this patient. Pertinent  Health Maintenance Due  Topic Date Due  . FOOT EXAM  04/21/2017 (Originally 03/12/1960)  . MAMMOGRAM  04/21/2017 (Originally 03/12/1999)  . OPHTHALMOLOGY EXAM  04/21/2017 (Originally 03/12/1960)  . URINE MICROALBUMIN  04/21/2017 (Originally 03/12/1960)  . DEXA SCAN  04/21/2017 (Originally 03/11/2014)  . PNA vac Low Risk Adult (1 of 2 - PCV13) 04/21/2017 (Originally 03/11/2014)  . INFLUENZA VACCINE  09/28/2017 (Originally 09/14/2016)  . HEMOGLOBIN A1C  08/24/2017  . COLONOSCOPY  06/25/2025   Fall Risk  02/27/2017 02/15/2017  Falls in the past year? No No  Risk for fall due to : Impaired balance/gait Impaired balance/gait;Medication side effect   Functional Status Survey:    Vitals:   03/29/17 1214  Pulse: 91  Resp: 20  Temp: 98.2 F (36.8 C)  TempSrc: Oral  SpO2: 96%   There is no height or weight on file to calculate BMI. Physical Exam  In general this is a somewhat frail elderly female who does not appear to be in distress lying in bed  Her skin is warm and dry she is not diaphoretic.  Eyes visual acuity appears grossly intact sclera and conjunctive are clear.  Oropharynx is clear mucous membranes moist.  Chest is clear to auscultation with somewhat poor respiratory effort there is no labored breathing.  Heart is irregular irregular rate and rhythm without murmur gallop or rub  she does not have significant lower extremity edema.  Abdomen is soft there is some tenderness to palpation but this does not present as acute tenderness bowel sounds are positive.  Musculoskeletal has general frailty and has limited motion of her legs and has pain when she moves her legs which is not new--Appears to move her upper extremities at baseline psych per nursing   Neurologic appears to be grossly intact cranial nerves intact her speech is clear  Psych.-  Appears to be more bright and alert per family this has improved-responds appropriately to questions.       Labs reviewed: Recent Labs    03/19/17 1627  03/24/17 0615 03/26/17 0825 03/28/17 1613 03/28/17 1625  NA 137   < > 136 140 136 134*  K 3.7   < > 3.6 3.7 4.2 4.2  CL 99*   < > 98* 101 99* 96*  CO2 24   < > 29 28 25   --   GLUCOSE 141*   < > 117* 120* 124* 124*  BUN 18   < > 8 8 17 14   CREATININE 0.78   < > 0.57 0.55 0.69 0.70  CALCIUM 9.4   < > 9.7 9.8 9.8  --   MG 1.7  --   --  1.8  --   --    < > = values in this interval not displayed.   Recent Labs    03/14/17 1044 03/16/17 0416 03/28/17 1613  AST 28 23 22  ALT 81* 58* 20  ALKPHOS 259* 271* 159*  BILITOT 0.4 0.7 0.8  PROT 6.0* 6.2* 5.7*  ALBUMIN 3.0* 3.1* 2.4*   Recent Labs    03/19/17 1627 03/20/17 0246 03/24/17 0615 03/28/17 1613 03/28/17 1625  WBC 16.6* 13.2* 6.8 6.8  --   NEUTROABS 14.1*  --  5.1 5.4  --   HGB 9.4* 8.6* 9.2* 7.2* 7.1*  HCT 33.5* 30.2* 33.2* 24.7* 21.0*  MCV 78.1 76.6* 78.3 77.9*  --   PLT 217 202 180 169  --    Lab Results  Component Value Date   TSH 0.225 (L) 03/19/2017   Lab Results  Component Value Date   HGBA1C 6.9 (H) 02/24/2017   Lab Results  Component Value Date   CHOL (H) 02/23/2010    240        ATP III CLASSIFICATION:  <200     mg/dL   Desirable  200-239  mg/dL   Borderline High  >=240    mg/dL   High          HDL 58 02/23/2010   LDLCALC (H) 02/23/2010    132        Total  Cholesterol/HDL:CHD Risk Coronary Heart Disease Risk Table                     Men   Women  1/2 Average Risk   3.4   3.3  Average Risk       5.0   4.4  2 X Average Risk   9.6   7.1  3 X Average Risk  23.4   11.0        Use the calculated Patient Ratio above and the CHD Risk Table to determine the patient's CHD Risk.        ATP III CLASSIFICATION (LDL):  <100     mg/dL   Optimal  100-129  mg/dL   Near or Above                    Optimal  130-159  mg/dL   Borderline  160-189  mg/dL   High  >190     mg/dL   Very High   TRIG 248 (H) 02/23/2010   CHOLHDL 4.1 02/23/2010    Significant Diagnostic Results in last 30 days:  Dg Pelvis 1-2 Views  Result Date: 03/28/2017 CLINICAL DATA:  Known femur metastasis.  Severe pain. EXAM: PELVIS - 1-2 VIEW COMPARISON:  PET CT 02/06/2017 FINDINGS: The previously seen pelvic and bilateral proximal femoral metastatic disease is not appreciated well by plain film. There is a mixed lytic and sclerotic area within the left femoral neck corresponding to lesion seen on prior PET CT. Fracture noted involving the lesser trochanter of the right femur, likely avulsion type. This could be related to pathologic fracture and metastasis although this cannot be confirmed by plain film. No subluxation or dislocation. IMPRESSION: Known bony metastatic disease in the pelvis and femurs not as well visualized by plain film. Lesion within the left femoral neck is seen. Displaced avulsion fracture of the lesser trochanter on the right. Electronically Signed   By: Rolm Baptise M.D.   On: 03/28/2017 18:58   Ct Head Wo Contrast  Result Date: 03/28/2017 CLINICAL DATA:  Altered level of consciousness. History of metastatic adenocarcinoma of the lung with metastasis to the right femur. EXAM: CT HEAD WITHOUT CONTRAST TECHNIQUE: Contiguous axial images were obtained from the base of the skull through the vertex  without intravenous contrast. COMPARISON:  02/22/2010 CT, MRI 03/10/2017  FINDINGS: Images are slightly degraded by patient motion. Brain: Small vessel ischemic disease of periventricular white matter. No hydrocephalus. No intra-axial mass nor extra-axial fluid collections are visualized. No large vascular territory infarction. Midline fourth ventricle and basal cisterns. Vascular: Metallic streak artifacts emanating from the expected location of the right MCA compatible with history of aneurysm clipping. Skull: No acute fracture or suspicious osseous lesions. Stable appearance of the bony skull dating back to 2012. Small rounded hypodensities within the diaper plug space are stable dating back to the previous comparison and likely reflect benign findings. No frank bone destruction is seen. Sinuses/Orbits: No acute finding. Other: None IMPRESSION: Chronic stable microvascular ischemic disease of periventricular white matter. No acute intracranial abnormality. Electronically Signed   By: Ashley Royalty M.D.   On: 03/28/2017 18:34   Mr Jodene Nam Head Wo Contrast  Result Date: 03/10/2017 CLINICAL DATA:  Stroke follow-up. History of aneurysm coiling. Weakness and difficulty walking. Cervical and breast cancer history. EXAM: MRA NECK WITHOUT CONTRAST MRA HEAD WITHOUT CONTRAST TECHNIQUE: Multiplanar and multiecho pulse sequences of the neck were obtained without intravenous contrast. Angiographic images of the neck were obtained using MRA technique without intravenous contrast.; Angiographic images of the Circle of Willis were obtained using MRA technique without intravenous contrast. COMPARISON:  None. FINDINGS: MRA NECK FINDINGS Normal three-vessel aortic arch branching pattern. The visualized portions of the subclavian arteries are normal. Both vertebral artery origins are normal. The vertebral system is left dominant. Moderate narrowing of the left vertebral artery mid V2 segment (series 6, image 128). The remainder of the left vertebral artery course is normal. No focal stenosis of the right  vertebral artery. There is moderate atherosclerotic plaque at the right carotid bifurcation causing approximately 50% narrowing of the distal right common carotid artery. No hemodynamically significant stenosis of the right internal carotid artery. Atherosclerotic plaque at the left carotid bifurcation extending into the left internal carotid artery results in critical stenosis of the left ICA with severe narrowing along the remainder of the cervical internal carotid artery course. This has worsened slightly relative to 05/25/2011. MRA HEAD FINDINGS Intracranial internal carotid arteries: Minimal flow related enhancement of the left internal carotid artery to the ophthalmic segment. No flow related enhancement within the left carotid terminus. No right internal carotid artery stenosis. Status post coiling of right P-comm origin aneurysm. No residual aneurysm filling. Anterior cerebral arteries: Normal right anterior cerebral artery. There is no flow related enhancement within the left ACA A1 segment. Enhancement of the left A2 segment is via the anterior communicating artery. Middle cerebral arteries: Normal right MCA. No flow related enhancement in the left MCA M1 segment. Unchanged degree of collateralization in the distal left MCA distribution. Posterior communicating arteries: Patent on the left. Incomplete on the right. Status post coiling of right P-comm origin aneurysm. Posterior cerebral arteries: Normal. Basilar artery: Normal. Vertebral arteries: Left dominant. Normal. Superior cerebellar arteries: Normal. Anterior inferior cerebellar arteries: Normal. Posterior inferior cerebellar arteries: Normal. IMPRESSION: 1. Critical stenosis of the left internal carotid artery, slightly worsened at the left ICA origin, but otherwise unchanged with minimal flow related enhancement to the level of the ophthalmic segment and no enhancement beyond. No right ICA stenosis. 2. Unchanged occlusion of the left anterior  cerebral artery A 1 segment and left middle cerebral artery M 1 segment. 3. Unchanged moderate narrowing of the mid P 2 segment of the left vertebral artery. This may be exaggerated by changes  in flow direction on this time-of-flight study. 4. Status post coiling of right P-comm origin aneurysm without residual aneurysm filling. Electronically Signed   By: Ulyses Jarred M.D.   On: 03/10/2017 14:27   Mr Jodene Nam Neck Wo Contrast  Result Date: 03/10/2017 CLINICAL DATA:  Stroke follow-up. History of aneurysm coiling. Weakness and difficulty walking. Cervical and breast cancer history. EXAM: MRA NECK WITHOUT CONTRAST MRA HEAD WITHOUT CONTRAST TECHNIQUE: Multiplanar and multiecho pulse sequences of the neck were obtained without intravenous contrast. Angiographic images of the neck were obtained using MRA technique without intravenous contrast.; Angiographic images of the Circle of Willis were obtained using MRA technique without intravenous contrast. COMPARISON:  None. FINDINGS: MRA NECK FINDINGS Normal three-vessel aortic arch branching pattern. The visualized portions of the subclavian arteries are normal. Both vertebral artery origins are normal. The vertebral system is left dominant. Moderate narrowing of the left vertebral artery mid V2 segment (series 6, image 128). The remainder of the left vertebral artery course is normal. No focal stenosis of the right vertebral artery. There is moderate atherosclerotic plaque at the right carotid bifurcation causing approximately 50% narrowing of the distal right common carotid artery. No hemodynamically significant stenosis of the right internal carotid artery. Atherosclerotic plaque at the left carotid bifurcation extending into the left internal carotid artery results in critical stenosis of the left ICA with severe narrowing along the remainder of the cervical internal carotid artery course. This has worsened slightly relative to 05/25/2011. MRA HEAD FINDINGS Intracranial  internal carotid arteries: Minimal flow related enhancement of the left internal carotid artery to the ophthalmic segment. No flow related enhancement within the left carotid terminus. No right internal carotid artery stenosis. Status post coiling of right P-comm origin aneurysm. No residual aneurysm filling. Anterior cerebral arteries: Normal right anterior cerebral artery. There is no flow related enhancement within the left ACA A1 segment. Enhancement of the left A2 segment is via the anterior communicating artery. Middle cerebral arteries: Normal right MCA. No flow related enhancement in the left MCA M1 segment. Unchanged degree of collateralization in the distal left MCA distribution. Posterior communicating arteries: Patent on the left. Incomplete on the right. Status post coiling of right P-comm origin aneurysm. Posterior cerebral arteries: Normal. Basilar artery: Normal. Vertebral arteries: Left dominant. Normal. Superior cerebellar arteries: Normal. Anterior inferior cerebellar arteries: Normal. Posterior inferior cerebellar arteries: Normal. IMPRESSION: 1. Critical stenosis of the left internal carotid artery, slightly worsened at the left ICA origin, but otherwise unchanged with minimal flow related enhancement to the level of the ophthalmic segment and no enhancement beyond. No right ICA stenosis. 2. Unchanged occlusion of the left anterior cerebral artery A 1 segment and left middle cerebral artery M 1 segment. 3. Unchanged moderate narrowing of the mid P 2 segment of the left vertebral artery. This may be exaggerated by changes in flow direction on this time-of-flight study. 4. Status post coiling of right P-comm origin aneurysm without residual aneurysm filling. Electronically Signed   By: Ulyses Jarred M.D.   On: 03/10/2017 14:27   US Biopsy (liver)  Result Date: 03/16/2017 INDICATION: Metastatic lung cancer, hepatic mass EXAM: ULTRASOUND GUIDED CORE BIOPSY OF RIGHT HEPATIC MASS MEDICATIONS: 1%  LIDOCAINE LOCAL ANESTHESIA/SEDATION: Versed 2.0mg  IV; Fentanyl 164mcg IV; Moderate Sedation Time:  11 MINUTES The patient was continuously monitored during the procedure by the interventional radiology nurse under my direct supervision. FLUOROSCOPY TIME:  Fluoroscopy Time: NONE. COMPLICATIONS: None immediate. PROCEDURE: The procedure, risks, benefits, and alternatives were explained to the patient.  Questions regarding the procedure were encouraged and answered. The patient understands and consents to the procedure. Previous imaging reviewed. Patient positioned right anterior oblique. Preliminary ultrasound performed. The 3.6 cm hypoechoic solid lesion in the right hepatic dome was localized. Overlying skin marked. Under sterile conditions and local anesthesia, a 17 gauge 11.8 cm access needle was advanced percutaneously into the lesion. Needle position confirmed with ultrasound. Images obtained for documentation. 3 18 gauge core biopsies obtained. Samples placed in formalin. Needle tract occluded with Gel-Foam. Postprocedure imaging demonstrates no hemorrhage or hematoma. Patient tolerated the biopsy well. FINDINGS: Imaging confirms needle placed into the right hepatic dome lesion for core biopsy IMPRESSION: Successful ultrasound right hepatic dome mass 18 gauge core biopsy Electronically Signed   By: Jerilynn Mages.  Shick M.D.   On: 03/16/2017 13:03   Ir US Guide Vasc Access Right  Result Date: 03/08/2017 INDICATION: 67 year old with metastatic non-small cell lung cancer. Planning for chemotherapy. EXAM: FLUOROSCOPIC AND ULTRASOUND GUIDED PLACEMENT OF A SUBCUTANEOUS PORT COMPARISON:  None. MEDICATIONS: Ancef 2 g; The antibiotic was administered within an appropriate time interval prior to skin puncture. ANESTHESIA/SEDATION: Versed 2.0 mg IV; Fentanyl 100 mcg IV; Moderate Sedation Time:  36 minutes The patient was continuously monitored during the procedure by the interventional radiology nurse under my direct supervision.  FLUOROSCOPY TIME:  18 seconds, 3 mGy COMPLICATIONS: None immediate. PROCEDURE: The procedure, risks, benefits, and alternatives were explained to the patient. Questions regarding the procedure were encouraged and answered. The patient understands and consents to the procedure. Patient was placed supine on the interventional table. Ultrasound confirmed a patent right internal jugular vein. The right chest and neck were cleaned with a skin antiseptic and a sterile drape was placed. Maximal barrier sterile technique was utilized including caps, mask, sterile gowns, sterile gloves, sterile drape, hand hygiene and skin antiseptic. The right neck was anesthetized with 1% lidocaine. Small incision was made in the right neck with a blade. Micropuncture set was placed in the right internal jugular vein with ultrasound guidance. The micropuncture wire was used for measurement purposes. The right chest was anesthetized with 1% lidocaine with epinephrine. #15 blade was used to make an incision and a subcutaneous port pocket was formed. [place] was assembled. Subcutaneous tunnel was formed with a stiff tunneling device. The port catheter was brought through the subcutaneous tunnel. The port was placed in the subcutaneous pocket and sutured in place. The micropuncture set was exchanged for a peel-away sheath. The catheter was placed through the peel-away sheath and the tip was positioned at the superior cavoatrial junction. Catheter placement was confirmed with fluoroscopy. The port was accessed and flushed with heparinized saline. The port pocket was closed using two layers of absorbable sutures and Dermabond. The vein skin site was closed using a single layer of absorbable suture and Dermabond. Sterile dressings were applied. Patient tolerated the procedure well without an immediate complication. Ultrasound and fluoroscopic images were taken and saved for this procedure. IMPRESSION: Placement of a subcutaneous port  device. Electronically Signed   By: Markus Daft M.D.   On: 03/08/2017 16:54   Dg Chest Port 1 View  Result Date: 03/28/2017 CLINICAL DATA:  Altered mental status.  History of lung cancer. EXAM: PORTABLE CHEST 1 VIEW COMPARISON:  Chest x-ray dated March 19, 2017. FINDINGS: Unchanged right chest wall port catheter with tip in the distal SVC. The heart size and mediastinal contours are within normal limits. Normal pulmonary vascularity. No focal consolidation, pleural effusion, or pneumothorax. No  acute osseous abnormality. IMPRESSION: No active disease. Electronically Signed   By: Titus Dubin M.D.   On: 03/28/2017 16:50   Dg Chest Port 1 View  Result Date: 03/19/2017 CLINICAL DATA:  Heart palpitations. Metastatic cancer. Previously treated breast cancer. EXAM: PORTABLE CHEST 1 VIEW COMPARISON:  01/18/2017.  PET-CT dated 02/06/2017. FINDINGS: Poor inspiration. Borderline enlarged cardiac silhouette. Increased prominence of the pulmonary vasculature and interstitial markings. No visible pleural fluid. Left axillary surgical clips. Right jugular porta catheter tip in the superior vena cava. Mottled appearance of the right humeral neck. No abnormal activity at that location on the previous PET-CT. IMPRESSION: 1. Poor inspiration with interval borderline cardiomegaly, pulmonary vascular congestion and probable mild interstitial pulmonary edema. 2. Possible interval right humeral neck metastasis. Electronically Signed   By: Claudie Revering M.D.   On: 03/19/2017 17:19   Dg Femur Min 2 Views Right  Result Date: 03/28/2017 CLINICAL DATA:  Increasing pelvic and right upper leg pain. Known metastatic disease. EXAM: RIGHT FEMUR 2 VIEWS COMPARISON:  PET CT 02/06/2017 FINDINGS: Previously seen proximal right femoral bone metastasis not well visualized. There is a displaced fracture involving the right femoral lesser trochanter. No fracture cross the femoral neck. Prior right knee replacement. IMPRESSION: Avulsion  fracture of the lesser trochanter. No femoral neck fracture. Previously seen known bony metastatic disease not well visualized by plain film. Electronically Signed   By: Rolm Baptise M.D.   On: 03/28/2017 19:02   Ir Fluoro Guide Port Insertion Right  Result Date: 03/08/2017 INDICATION: 67 year old with metastatic non-small cell lung cancer. Planning for chemotherapy. EXAM: FLUOROSCOPIC AND ULTRASOUND GUIDED PLACEMENT OF A SUBCUTANEOUS PORT COMPARISON:  None. MEDICATIONS: Ancef 2 g; The antibiotic was administered within an appropriate time interval prior to skin puncture. ANESTHESIA/SEDATION: Versed 2.0 mg IV; Fentanyl 100 mcg IV; Moderate Sedation Time:  36 minutes The patient was continuously monitored during the procedure by the interventional radiology nurse under my direct supervision. FLUOROSCOPY TIME:  18 seconds, 3 mGy COMPLICATIONS: None immediate. PROCEDURE: The procedure, risks, benefits, and alternatives were explained to the patient. Questions regarding the procedure were encouraged and answered. The patient understands and consents to the procedure. Patient was placed supine on the interventional table. Ultrasound confirmed a patent right internal jugular vein. The right chest and neck were cleaned with a skin antiseptic and a sterile drape was placed. Maximal barrier sterile technique was utilized including caps, mask, sterile gowns, sterile gloves, sterile drape, hand hygiene and skin antiseptic. The right neck was anesthetized with 1% lidocaine. Small incision was made in the right neck with a blade. Micropuncture set was placed in the right internal jugular vein with ultrasound guidance. The micropuncture wire was used for measurement purposes. The right chest was anesthetized with 1% lidocaine with epinephrine. #15 blade was used to make an incision and a subcutaneous port pocket was formed. [place] was assembled. Subcutaneous tunnel was formed with a stiff tunneling device. The  port catheter was brought through the subcutaneous tunnel. The port was placed in the subcutaneous pocket and sutured in place. The micropuncture set was exchanged for a peel-away sheath. The catheter was placed through the peel-away sheath and the tip was positioned at the superior cavoatrial junction. Catheter placement was confirmed with fluoroscopy. The port was accessed and flushed with heparinized saline. The port pocket was closed using two layers of absorbable sutures and Dermabond. The vein skin site was closed using a single layer of absorbable suture and Dermabond. Sterile dressings were  applied. Patient tolerated the procedure well without an immediate complication. Ultrasound and fluoroscopic images were taken and saved for this procedure. IMPRESSION: Placement of a subcutaneous port device. Electronically Signed   By: Markus Daft M.D.   On: 03/08/2017 16:54    Assessment/Plan  History of metastatic lung cancer-as noted above pain medication adjustments were made she is now on morphine 30 mg 3 times daily routinely and Roxanol every 4 hours as needed-this will be followed by hospice and she will be transferred to the hospice house this afternoon.  Currently she is not in any acute distress and mental status appears to be improved.  Again this will be followed once she is under hospice services at hospice house today.  2.  In regards to atrial fibrillation this appears rate controlled she is on Lopressor--She is on Xarelto for anticoagulation.   #3 history of depression with anxiety she continues on Cymbalta and does have Ativan as needed #4 history of  #4 history of recurrent left leg DVT continues on Xarelto.  5.  History of anemia again hemoglobin is down somewhat at 7.2 since she is under hospice services [id number] be conservative follow-up but will defer to hospice  #6 history of rheumatoid arthritis continues on Plaquenil as well as steroid treatment  [id number]  [id number]  Of note greater than 30 minutes spent on this discharge summary-greater than 50% of time spent coordinating plan of care for numerous diagnoses

## 2017-03-30 ENCOUNTER — Ambulatory Visit: Payer: Medicare Other

## 2017-03-30 LAB — URINE CULTURE: CULTURE: NO GROWTH

## 2017-03-31 ENCOUNTER — Ambulatory Visit (HOSPITAL_COMMUNITY): Payer: Medicare Other | Admitting: Internal Medicine

## 2017-03-31 ENCOUNTER — Ambulatory Visit: Payer: Medicare Other

## 2017-03-31 NOTE — Progress Notes (Signed)
  Radiation Oncology         [phone]) [phone] ________________________________  Name: [name] MRN: [id number]  Date: 03/29/2017  DOB: May 26, 1950  End of Treatment Note  Diagnosis:   67 y.o. female with adenocarcinoma of the left upper lung metastatic to multiple skeletal sites     Indication for treatment:  Palliative       Radiation treatment dates:   03/16/2017 - 03/27/2017  Planned Site/dose:    1. Sacral metastases and proximal femora / 30 Gy in 10 fractions  2. Left upper lung primary and T-spine / 30 Gy in 10 fractions  The patient only received 21 Gy in 7 fractions to each site due to stopping treatment early.  Beams/energy:    1. 3D / 6X, 10X Photon  2. 3D / 15X Photon  Narrative: The patient tolerated radiation treatment relatively well. However, she experienced increasing pain and her condition continued to decline, and ultimately it was more appropriate to transition her to hospice.  Plan: The patient has completed radiation treatment. The patient will return to radiation oncology clinic for routine followup in one month. I advised her to call or return sooner if she has any questions or concerns related to her recovery or treatment. ________________________________  Sheral Apley. Tammi Klippel, M.D.  This document serves as a record of services personally performed by Tyler Pita, MD. It was created on his behalf by Rae Lips, a trained medical scribe. The creation of this record is based on the scribe's personal observations and the provider's statements to them. This document has been checked and approved by the attending provider.

## 2017-04-02 LAB — CULTURE, BLOOD (ROUTINE X 2)
CULTURE: NO GROWTH
Culture: NO GROWTH
SPECIAL REQUESTS: ADEQUATE
Special Requests: ADEQUATE

## 2017-04-07 ENCOUNTER — Encounter (HOSPITAL_COMMUNITY): Payer: Self-pay

## 2017-04-09 NOTE — Progress Notes (Deleted)
Cardiology Office Note    Date:  04/09/2017   ID:  Monica, James Feb 19, 1950, MRN [id number]  PCP:  Jani Gravel, MD  Cardiologist: Kate Sable, MD    No chief complaint on file.   History of Present Illness:    [name] is a 67 y.o. female with past medical history of Stage IV NSCLC with metastatic disease, RA, HTN, HLD, carotid artery stenosis, and prior DVT (diagnosed in 01/2016) who presents to the office today for hospital follow-up.   She was recently admitted to [facility] on [facility]/19/2017 for evaluation of palpitations and irregular heart rate, found to be in atrial fibrillation with RVR.  She was started on IV Amiodarone upon arrival and converted to normal sinus rhythm.  An echocardiogram was obtained and showed a preserved EF of 65-70%, no regional wall motion abnormalities, mild aortic stenosis, mild MR, and mild to moderate TR. Cardiology was consulted for further management and Nebivolol was switched to Lopressor 25 mg twice daily. She was continued on Xarelto for anticoagulation.    Past Medical History:  Diagnosis Date  . Anemia   . Angina   . Arthritis    Rheumatoid  . Brain cancer ([place])   . Breast cancer ([place])   . Cancer ([place])    left breast, cervical cancer  . Colon polyps   . Constipation   . Diabetes mellitus without complication ([place])   . Diarrhea   . Dysrhythmia   . GERD (gastroesophageal reflux disease)   . Headache   . Heart murmur   . History of kidney stones   . Hypercholesterolemia   . Hypertension   . Lung cancer ([place])   . Numbness and tingling in right hand   . Numbness and tingling of both feet   . Peripheral vascular disease ([place])    cerebrel aneurysm /sp coil  . Pneumonia   . PONV (postoperative nausea and vomiting)   . Raynaud's syndrome   . Rheumatoid arthritis with rheumatoid factor (HCC)   . Right knee DJD   . Stroke [facility])    jan 2012  . VTE (venous thromboembolism)     Past Surgical History:  Procedure  Laterality Date  . ABDOMINAL HYSTERECTOMY  1977  . APPENDECTOMY  1964  . BILATERAL OOPHORECTOMY    . CHOLECYSTECTOMY    . COLONOSCOPY  12/09/2010   Procedure: COLONOSCOPY;  Surgeon: Rogene Houston, MD;  [facility];  Service: Endoscopy;  Laterality: N/A;  . COLONOSCOPY N/A 06/26/2015   Procedure: COLONOSCOPY;  Surgeon: Rogene Houston, MD;  [facility];  Service: Endoscopy;  Laterality: N/A;  1:05-moved to [address] to notify pt  . ESOPHAGOGASTRODUODENOSCOPY N/A 06/26/2015   Procedure: ESOPHAGOGASTRODUODENOSCOPY (EGD);  Surgeon: Rogene Houston, MD;  [facility];  Service: Endoscopy;  Laterality: N/A;  . EYE SURGERY     cataract surgery bilat   . GIVENS CAPSULE STUDY N/A 08/20/2015   Procedure: GIVENS CAPSULE STUDY;  Surgeon: Rogene Houston, MD;  [facility];  Service: Endoscopy;  Laterality: N/A;  730  . IR FLUORO GUIDE PORT INSERTION RIGHT  03/08/2017  . IR US GUIDE VASC ACCESS RIGHT  03/08/2017  . left breast lumpectomy  2004  . left knee arthroscopy    . Right brain aneurysm  04/2010   Coiled   . right wrist surgery  1999   synevectomy  . TOTAL KNEE ARTHROPLASTY Right 02/25/2013   Procedure: TOTAL KNEE ARTHROPLASTY;  Surgeon: Lorn Junes, MD;  Location: [place];  Service: Orthopedics;  Laterality: Right;    Current Medications: Outpatient Medications Prior to Visit  Medication Sig Dispense Refill  . acetaminophen (TYLENOL) 325 MG tablet Take 2 tablets (650 mg total) by mouth every 6 (six) hours as needed for mild pain (or Fever >/= 101). 30 tablet 0  . allopurinol (ZYLOPRIM) 300 MG tablet Take 300 mg by mouth daily.     Marland Kitchen atorvastatin (LIPITOR) 20 MG tablet Take 20 mg by mouth at bedtime.    Roseanne Kaufman [place]-Castor Oil (VENELEX) OINT Apply to sacrum and bilateral buttocks q shift and prn    . Calcium Carbonate-Vit D-Min (CALTRATE 600+D PLUS) 600-400 MG-UNIT per tablet Chew 1 tablet by mouth 2 (two) times daily.      . Cholecalciferol  (VITAMIN D) 2000 UNITS tablet Take 2,000 Units by mouth daily.     Marland Kitchen docusate sodium (COLACE) 100 MG capsule Take 1 capsule (100 mg total) by mouth 2 (two) times daily.    . DULoxetine (CYMBALTA) 60 MG capsule Take 60 mg by mouth daily.    . hydroxychloroquine (PLAQUENIL) 200 MG tablet Take 200 mg by mouth daily.     Marland Kitchen ipratropium (ATROVENT) 0.06 % nasal spray Place 2 sprays into both nostrils 2 (two) times daily as needed for rhinitis.    Marland Kitchen LORazepam (ATIVAN) 0.5 MG tablet Take 1 tablet (0.5 mg total) by mouth every 6 (six) hours as needed for anxiety (Nausea or vomiting). 30 tablet 0  . Magnesium Oxide 250 MG TABS Take 500 mg by mouth daily with breakfast.    . methylPREDNISolone (MEDROL) 4 MG tablet Take 8 mg by mouth daily with breakfast.    . metoprolol tartrate (LOPRESSOR) 25 MG tablet Take 2 tablets (50 mg total) by mouth 2 (two) times daily.    Marland Kitchen morphine (MS CONTIN) 30 MG 12 hr tablet Take 1 tablet (30 mg total) by mouth 3 (three) times daily at 8am, 2pm and bedtime. Administered at 8a, 2pm, and 9pm 90 tablet 0  . morphine (ROXANOL) 20 MG/ML concentrated solution Take 10 mg by mouth every 4 (four) hours as needed for breakthrough pain.    Marland Kitchen omeprazole (PRILOSEC) 40 MG capsule TAKE (1) CAPSULE BY MOUTH ONCE DAILY. 30 capsule 11  . Polyethyl Glycol-Propyl Glycol (SYSTANE) 0.4-0.3 % GEL Apply 1 application to eye daily as needed (for dry eyes).     . polyethylene glycol (MIRALAX) packet Take 17 g by mouth daily.    . prochlorperazine (COMPAZINE) 10 MG tablet Take 1 tablet (10 mg total) by mouth every 6 (six) hours as needed (Nausea or vomiting). 30 tablet 1  . XARELTO 20 MG TABS tablet Take 1 tablet by mouth daily.     No facility-administered medications prior to visit.      Allergies:   Aurothioglucose [solganal]; Feldene [piroxicam]; Hydralazine; Imdur [isosorbide mononitrate]; Imuran [azathioprine]; Methotrexate derivatives; Calcium channel blockers; Sulfa antibiotics; Thimerosal; Vioxx  [rofecoxib]; Arava [leflunomide]; Asacol [mesalamine]; Iohexol; Hydrocodone; Latex; Neomycin; Nickel; Nsaids; and Percocet [oxycodone-acetaminophen]   Social History   Socioeconomic History  . Marital status: Married    Spouse name: Hedy Camara  . Number of children: o  . Years of education: 8  . Highest education level: Not on file  Social Needs  . Financial resource strain: Not on file  . Food insecurity - worry: Not on file  . Food insecurity - inability: Not on file  . Transportation needs - medical: Not on file  .  Transportation needs - non-medical: Not on file  Occupational History  . 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 Smoking status: Former Smoker    Packs/day: 2.00    Years: 15.00    Pack years: 30.00    Last attempt to quit: 02/20/1984    Years since quitting: 33.1  . Smokeless tobacco: Never Used  . Tobacco comment: Quit 1986  Substance and Sexual Activity  . Alcohol use: No  . Drug use: No  . Sexual activity: Not Currently  Other Topics Concern  . Not on file  Social History Narrative   Employed with [employer]. Retired January 2017. Married with no children. Quit smoking 1986. No alcohol or illicit drug use.    Right handed    Some college.   Caffeine one cup of tea daily.     Family History:  The patient's ***family history includes Cancer in her cousin, maternal aunt, and other; Heart attack in her father; Heart disease in her maternal uncle; Hypertension in her father.   Review of Systems:   Please see the history of present illness.     General:  No chills, fever, night sweats or weight changes.  Cardiovascular:  No chest pain, dyspnea on exertion, edema, orthopnea, palpitations, paroxysmal nocturnal dyspnea. Dermatological: No rash, lesions/masses Respiratory: No cough, dyspnea Urologic: No hematuria, dysuria Abdominal:   No nausea, vomiting, diarrhea, bright red blood  per rectum, melena, or hematemesis Neurologic:  No visual changes, wkns, changes in mental status. All other systems reviewed and are otherwise negative except as noted above.   Physical Exam:    VS:  There were no vitals taken for this visit.   General: Well developed, well nourished,female appearing in no acute distress. Head: Normocephalic, atraumatic, sclera non-icteric, no xanthomas, nares are without discharge.  Neck: No carotid bruits. JVD not elevated.  Lungs: Respirations regular and unlabored, without wheezes or rales.  Heart: ***Regular rate and rhythm. No S3 or S4.  No murmur, no rubs, or gallops appreciated. Abdomen: Soft, non-tender, non-distended with normoactive bowel sounds. No hepatomegaly. No rebound/guarding. No obvious abdominal masses. Msk:  Strength and tone appear normal for age. No joint deformities or effusions. Extremities: No clubbing or cyanosis. No edema.  Distal pedal pulses are 2+ bilaterally. Neuro: Alert and oriented X 3. Moves all extremities spontaneously. No focal deficits noted. Psych:  Responds to questions appropriately with a normal affect. Skin: No rashes or lesions noted  Wt Readings from Last 3 Encounters:  03/28/17 156 lb (70.8 kg)  03/23/17 156 lb 4.9 oz (70.9 kg)  03/18/17 164 lb 0.4 oz (74.4 kg)        Studies/Labs Reviewed:   EKG:  EKG is*** ordered today.  The ekg ordered today demonstrates ***  Recent Labs: 03/19/2017: B Natriuretic Peptide 251.0; TSH 0.225 03/26/2017: Magnesium 1.8 03/28/2017: ALT 20; BUN 14; Creatinine, Ser 0.70; Hemoglobin 7.1; Platelets 169; Potassium 4.2; Sodium 134   Lipid Panel    Component Value Date/Time   CHOL (H) 02/23/2010 0520    240        ATP III CLASSIFICATION:  <200     mg/dL   Desirable  200-239  mg/dL   Borderline High  >=240    mg/dL   High          TRIG 248 (H) 02/23/2010 0520   HDL 58 02/23/2010 0520   CHOLHDL 4.1 02/23/2010 0520  VLDL 50 (H) 02/23/2010 0520   LDLCALC (H)  02/23/2010 0520    132        Total Cholesterol/HDL:CHD Risk Coronary Heart Disease Risk Table                     Men   Women  1/2 Average Risk   3.4   3.3  Average Risk       5.0   4.4  2 X Average Risk   9.6   7.1  3 X Average Risk  23.4   11.0        Use the calculated Patient Ratio above and the CHD Risk Table to determine the patient's CHD Risk.        ATP III CLASSIFICATION (LDL):  <100     mg/dL   Optimal  100-129  mg/dL   Near or Above                    Optimal  130-159  mg/dL   Borderline  160-189  mg/dL   High  >190     mg/dL   Very High    Additional studies/ records that were reviewed today include:   Echocardiogram: 03/20/2017 Study Conclusions  - Left ventricle: The cavity size was normal. Wall thickness was   normal. Systolic function was vigorous. The estimated ejection   fraction was in the range of 65% to 70%. Wall motion was normal;   there were no regional wall motion abnormalities. Left   ventricular diastolic function parameters were normal. - Aortic valve: Trileaflet; mildly thickened, mildly calcified   leaflets. There was restricted movement of the noncoronary cusp.   There was mild stenosis. There was trivial regurgitation. Valve   area (VTI): 1.6 cm^2. Valve area (Vmax): 1.6 cm^2. Valve area   (Vmean): 1.68 cm^2. - Mitral valve: There was mild regurgitation. - Tricuspid valve: There was mild-moderate regurgitation. - Pulmonary arteries: PA peak pressure: 37 mm Hg (S).  Assessment:    No diagnosis found.   Plan:   In order of problems listed above:  1. ***    Medication Adjustments/Labs and Tests Ordered: Current medicines are reviewed at length with the patient today.  Concerns regarding medicines are outlined above.  Medication changes, Labs and Tests ordered today are listed in the Patient Instructions below. There are no Patient Instructions on file for this visit.   Signed, Erma Heritage, PA-C  04/09/2017 10:50 AM      [facility] [address] [place], [place] [address] Phone: [phone]

## 2017-04-11 ENCOUNTER — Ambulatory Visit: Payer: Medicare Other | Admitting: Student

## 2017-04-12 ENCOUNTER — Encounter: Payer: Self-pay | Admitting: Student

## 2017-05-15 DEATH — deceased

## 2017-09-11 ENCOUNTER — Ambulatory Visit: Payer: Medicare Other | Admitting: Internal Medicine

## 2018-06-11 ENCOUNTER — Encounter (INDEPENDENT_AMBULATORY_CARE_PROVIDER_SITE_OTHER): Payer: Self-pay | Admitting: *Deleted

## 2019-01-27 IMAGING — DX DG RIBS W/ CHEST 3+V*R*
3 series · 3 of 3 positions shown · non-contrast
Comparison: PA and lateral chest x-ray December 10, 2014.

CLINICAL DATA: Status post fall today striking the right shoulder
and right ribcage.

EXAM:
RIGHT RIBS AND CHEST - 3+ VIEW

[rib pa]
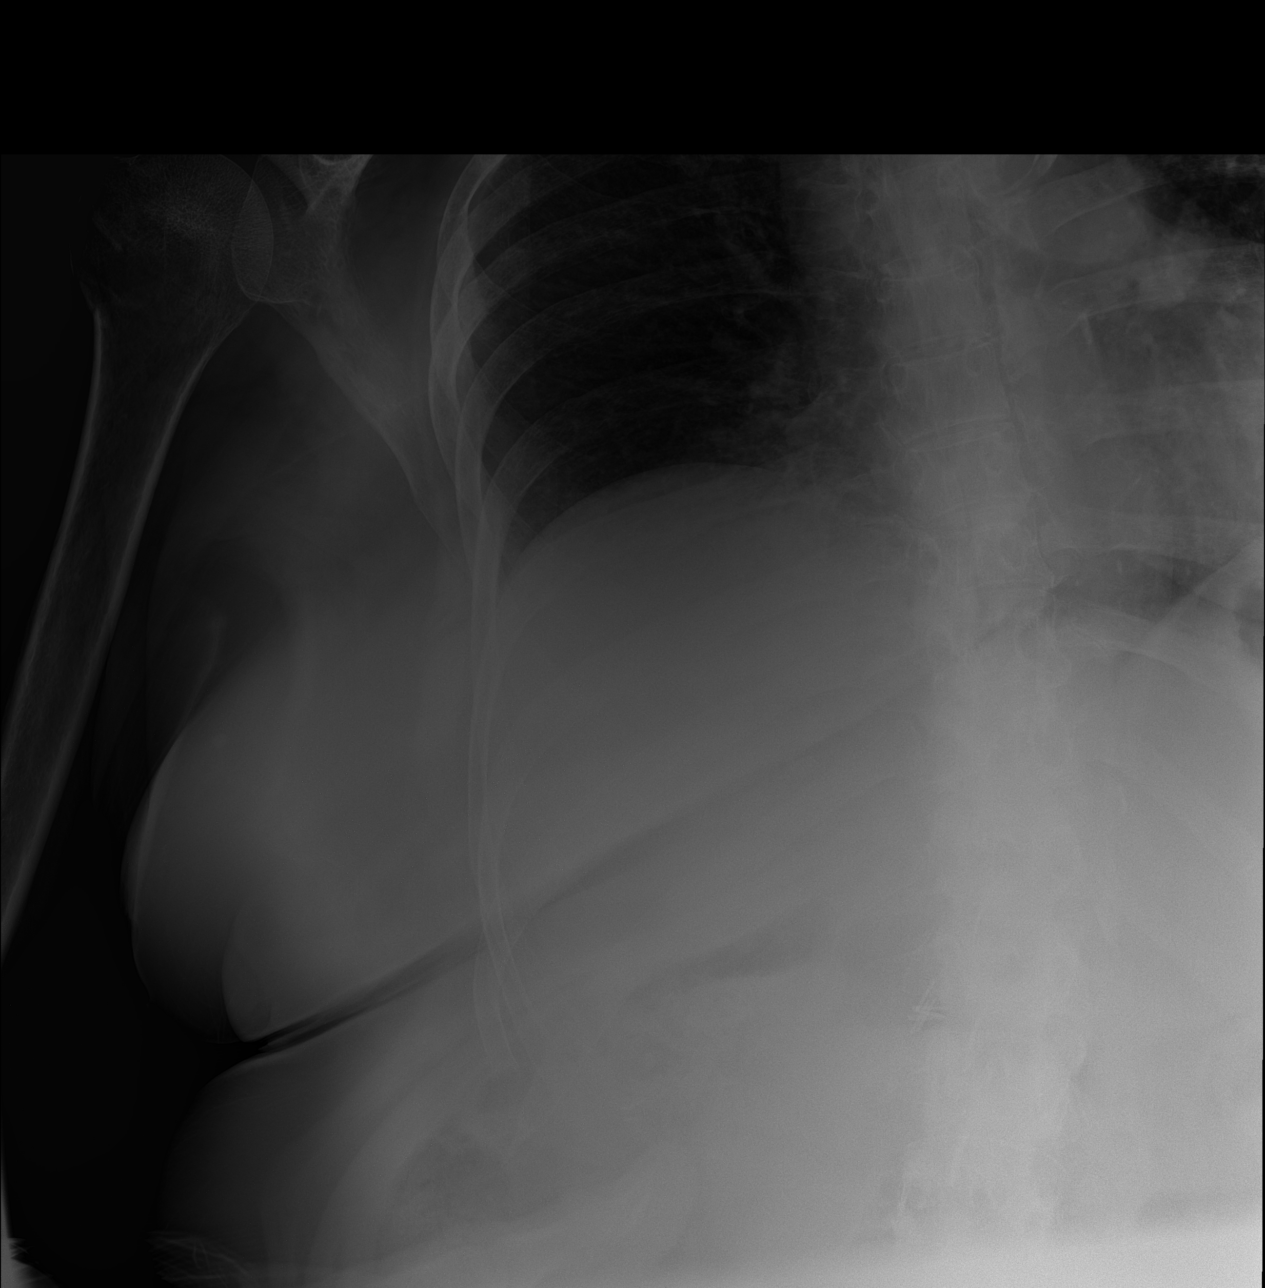

[rib obl]
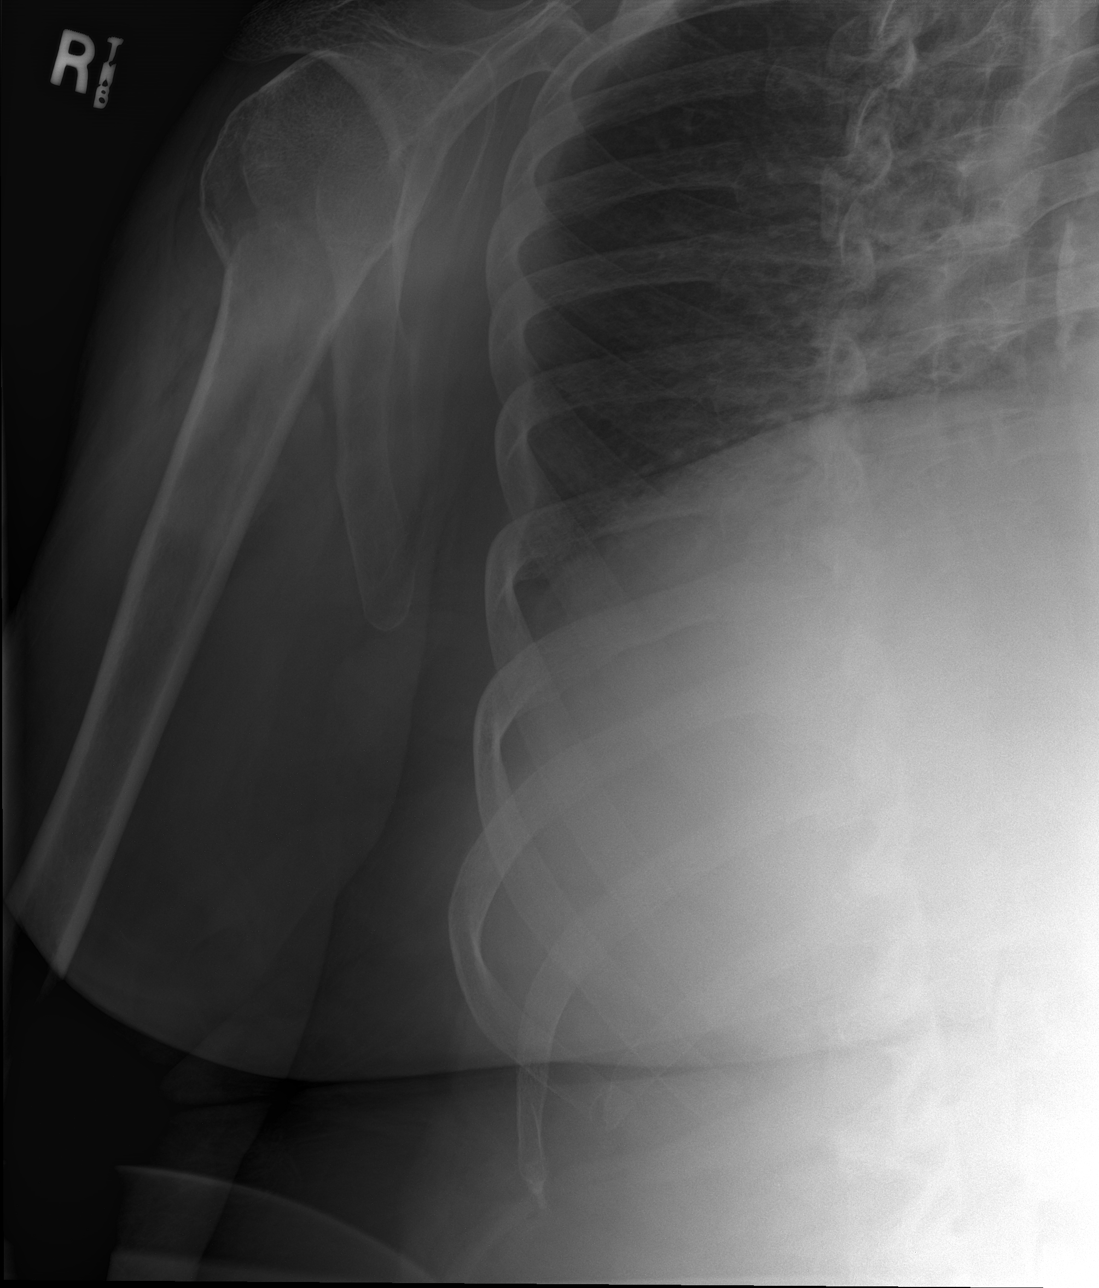

[chest ap]
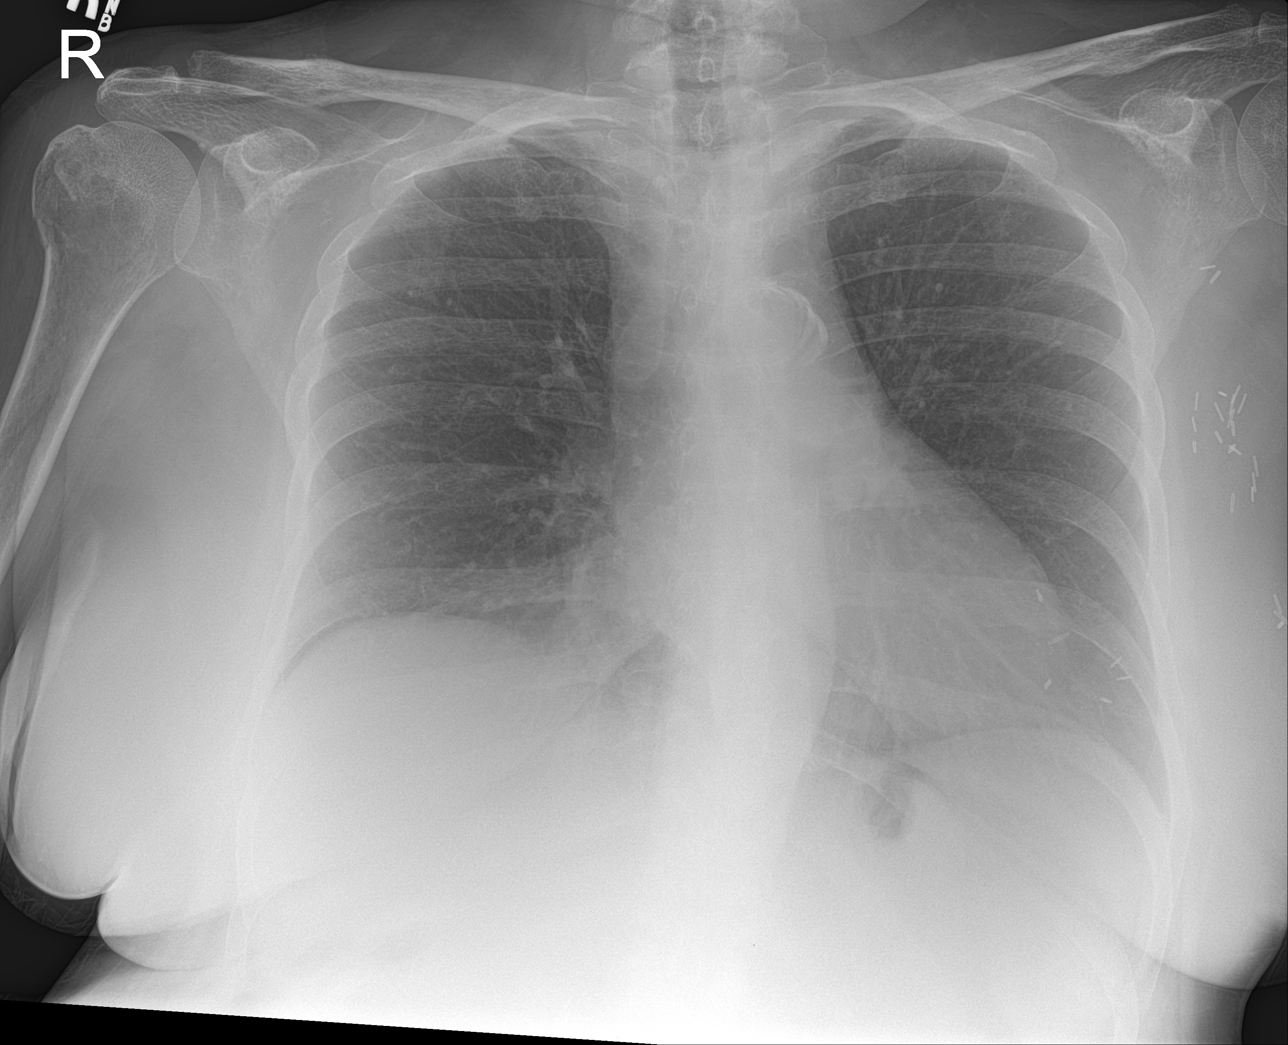

[3 of 3 positions shown; findings below may reference images not displayed]

FINDINGS: The lungs are reasonably well inflated. There is no pneumothorax or
pleural effusion. The heart and pulmonary vascularity are normal.
There is calcification in the wall of the aortic arch. Numerous
surgical clips are present in the left breast and left axillary
region. Patient does have a history of breast malignancy.

Right rib detail images reveal no acute displaced fracture. There is
a known fracture of the right humeral head in the region of the
greater tuberosity. There may be an impacted fracture of the humeral
neck.
IMPRESSION: No acute right rib fracture is observed. There is no acute
cardiopulmonary abnormality.

Thoracic aortic atherosclerosis.

## 2019-04-20 ENCOUNTER — Other Ambulatory Visit: Payer: Self-pay | Admitting: Nurse Practitioner

## 2019-10-04 IMAGING — MR MR HEAD WO/W CM
10 of 12 series · 27 of 48 positions shown · IV contrast (multihance)
Comparison: MRI head 01/30/2017, 05/25/2011

CLINICAL DATA: Breast cancer. Metastatic lung cancer. History of
right posterior communicating artery aneurysm coiling.

EXAM:
MRI HEAD WITHOUT AND WITH CONTRAST
TECHNIQUE: Multiplanar, multiecho pulse sequences of the brain and surrounding
structures were obtained without and with intravenous contrast.
CONTRAST:  15 mL MultiHance IV

[Series 1: loc · axial · 6.0mm · 0.59mm/px · z∈[-22,+150]mm · 2 of 19 slices shown]
[im 1/19]
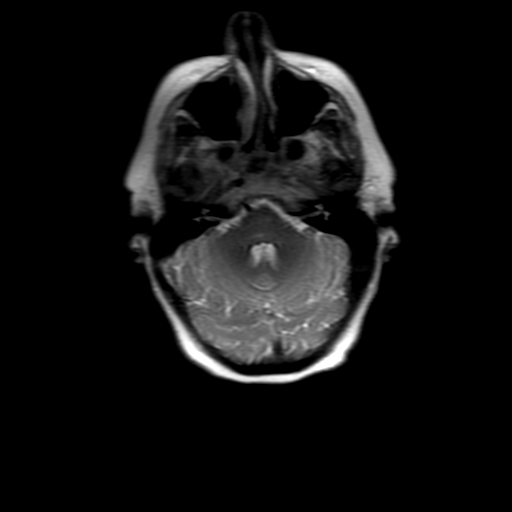
[im 19/19]
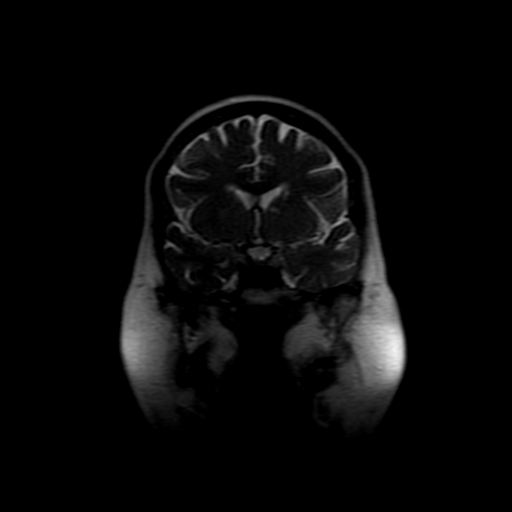

[Series 3: T1 · sagittal · 3.0mm · 0.47mm/px · 2 of 33 slices shown]
[im 1/33]
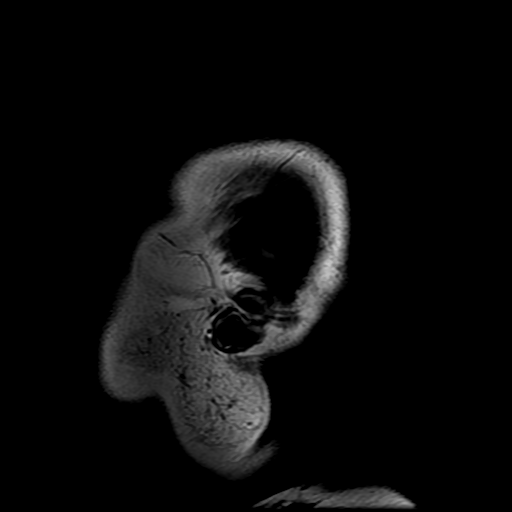
[im 33/33]
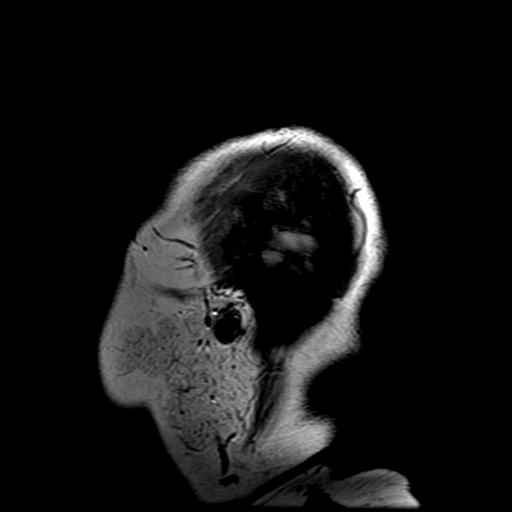

[Series 4: DWI · axial · 3.0mm · 1.09mm/px · z∈[-30,+112]mm · 7 of 98 slices shown (1 of 2)]
[im 1/98]
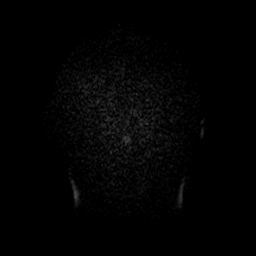
[im 17/98]
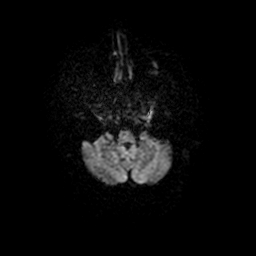
[im 33/98]
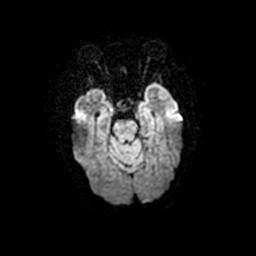
[im 49/98]
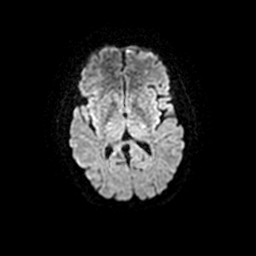
[im 65/98]
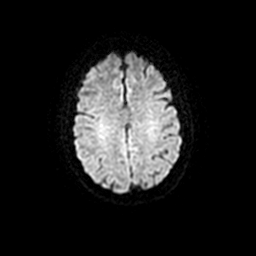
[im 81/98]
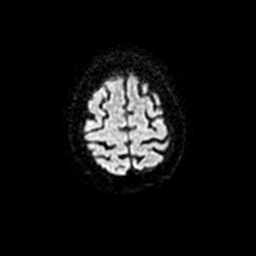
[im 98/98]
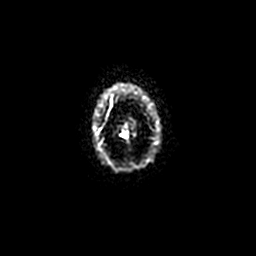

[Series 5: T2 · axial · 5.0mm · 0.86mm/px · z∈[-35,+107]mm · 2 of 25 slices shown]
[im 1/25]
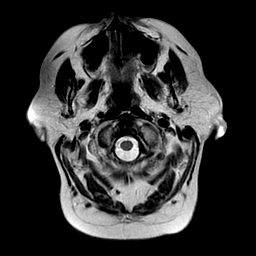
[im 25/25]
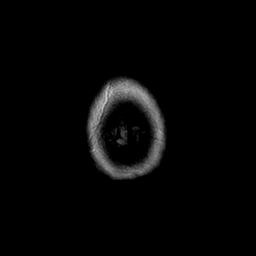

[Series 6: ax mpgr · axial · 5.0mm · 0.43mm/px · 1 of 25 slices shown]
[im 1/25]
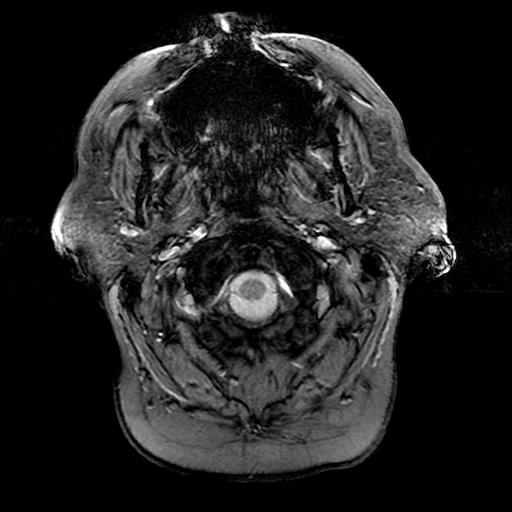

[Series 7: FLAIR · axial · 5.0mm · 0.43mm/px · z∈[-33,+108]mm · 2 of 25 slices shown]
[im 1/25]
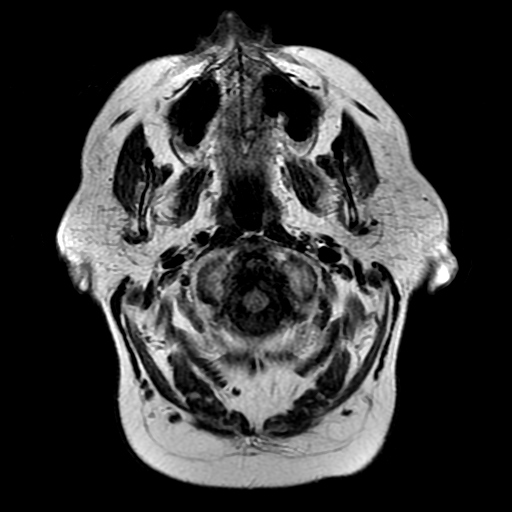
[im 25/25]
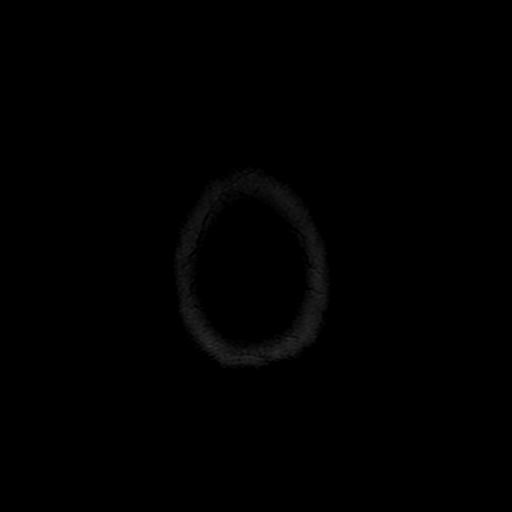

[Series 9: T2 post-contrast · coronal · 3.0mm · 0.47mm/px · 3 of 43 slices shown]
[im 1/43]
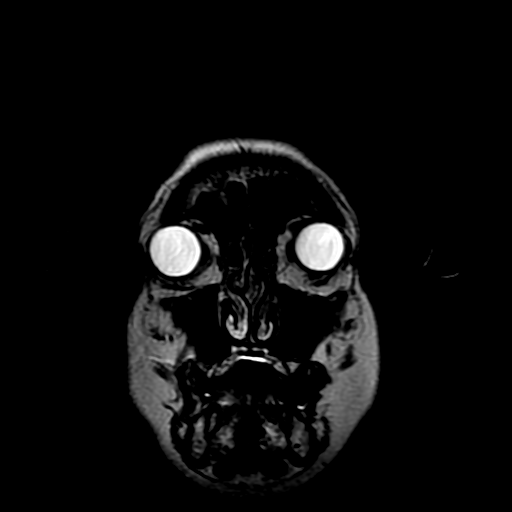
[im 22/43]
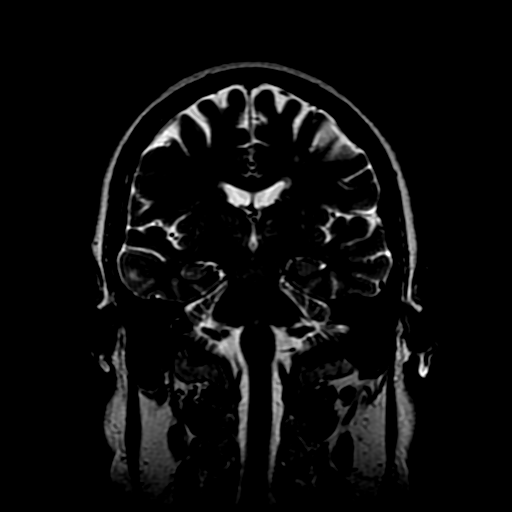
[im 43/43]
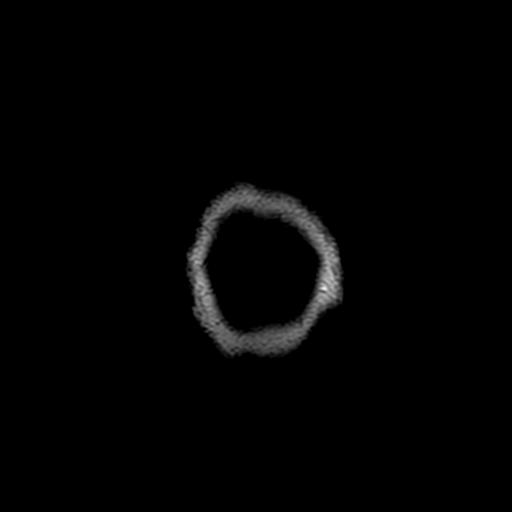

[Series 11: T1 post-contrast · coronal · 3.0mm · 0.47mm/px · 3 of 43 slices shown (1 of 2)]
[im 1/43]
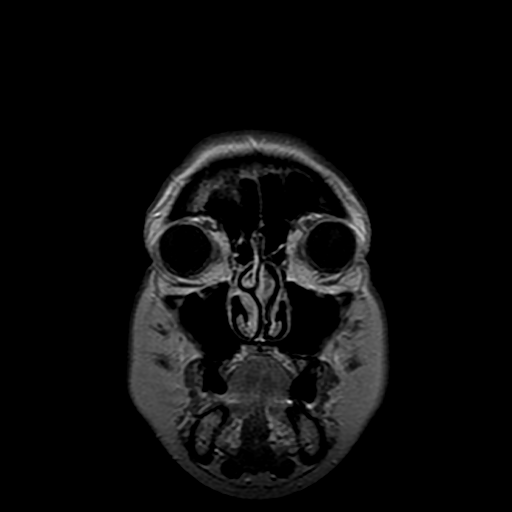
[im 22/43]
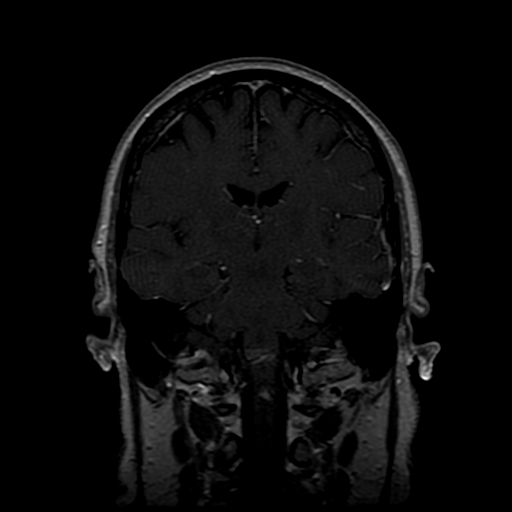
[im 43/43]
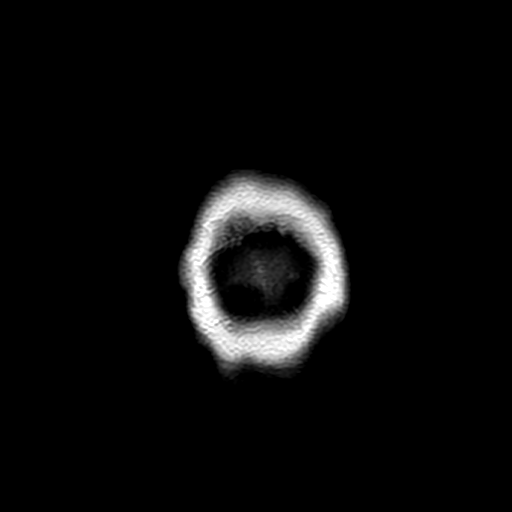

[Series 12: T1 post-contrast · sagittal · 3.0mm · 0.47mm/px · 2 of 33 slices shown (2 of 2)]
[im 1/33]
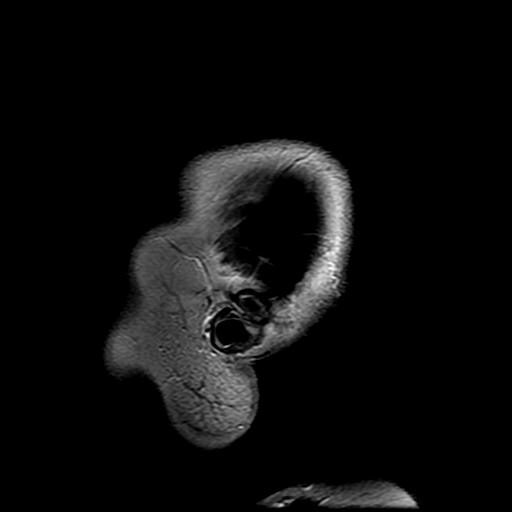
[im 33/33]
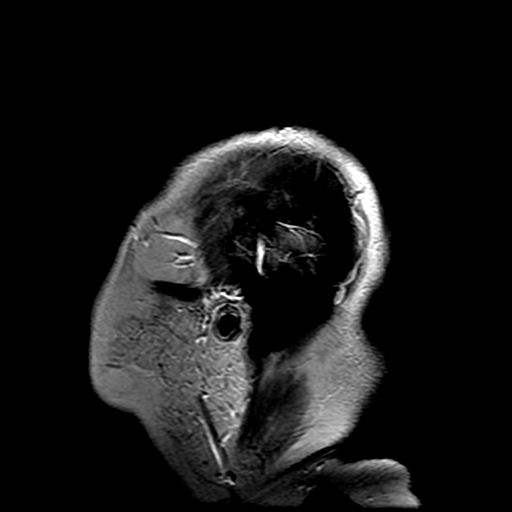

[Series 400: DWI · axial · 3.0mm · 1.09mm/px · z∈[-30,+112]mm · 3 of 48 slices shown (2 of 2)]
[im 1/48]
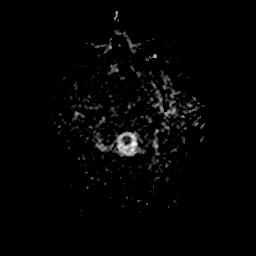
[im 24/48]
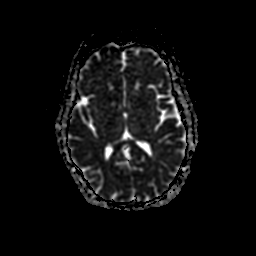
[im 48/48]
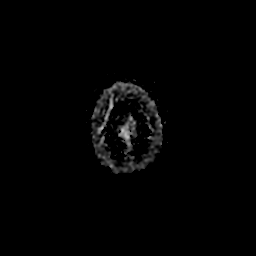

[27 of 48 positions shown; findings below may reference images not displayed]

FINDINGS: Brain: New area of restricted diffusion head of caudate on the left
compatible with acute/subacute infarct. Previously noted small areas
of restricted diffusion in the right occipital lobe now shows
ill-defined enhancement, most consistent with subacute infarct.
Small areas of restricted diffusion left parietal cortex and left
posterior temporal lobe have resolved. Findings most compatible with
ischemia rather than metastatic disease.

Chronic microvascular ischemic changes in the white matter and pons.
Small chronic infarct left frontal lobe. Negative for hemorrhage.

Postcontrast imaging degraded by motion. 6 mm enhancing lesion right
occipital lobe has angular margins and most compatible with subacute
enhancing infarct based on prior MRI and morphology. No other
worrisome enhancing lesions.

Vascular: Prior coiling of right posterior communicating artery
aneurysm with susceptibility. Chronic occlusion left internal
carotid artery

Skull and upper cervical spine: C3 vertebral body lesion has
progressed in the interval compatible with metastatic disease. No
calvarial lesions.

Sinuses/Orbits: Negative

Other: None
IMPRESSION: When compared with the prior MRI of 01/30/2017, there is improvement
in the areas of restricted diffusion in the left parietal and left
temporal lobe compatible with infarction. There enhancement of the
small lesion in the right occipital lobe most consistent with
subacute infarction. There is a new area of subacute infarct in the
head of caudate on the left.

Chronic occlusion left internal carotid artery with chronic ischemic
changes as above.

No definite metastatic disease in the brain. Continued close
followup is warranted.

Progression of bone marrow lesion in the C3 vertebral body
compatible with metastatic disease.
